# Patient Record
Sex: Male | Born: 1955 | Race: White | Hispanic: No | Marital: Married | Smoking: Current every day smoker
Health system: Southern US, Community
[De-identification: ages and names within clinical notes are randomized; demographics above are authoritative.]

## PROBLEM LIST (undated history)

## (undated) DIAGNOSIS — K219 Gastro-esophageal reflux disease without esophagitis: Secondary | ICD-10-CM

## (undated) DIAGNOSIS — I2510 Atherosclerotic heart disease of native coronary artery without angina pectoris: Secondary | ICD-10-CM

## (undated) DIAGNOSIS — E785 Hyperlipidemia, unspecified: Secondary | ICD-10-CM

## (undated) DIAGNOSIS — I10 Essential (primary) hypertension: Secondary | ICD-10-CM

## (undated) DIAGNOSIS — M1990 Unspecified osteoarthritis, unspecified site: Secondary | ICD-10-CM

## (undated) DIAGNOSIS — I6529 Occlusion and stenosis of unspecified carotid artery: Secondary | ICD-10-CM

## (undated) DIAGNOSIS — Z720 Tobacco use: Secondary | ICD-10-CM

## (undated) DIAGNOSIS — J449 Chronic obstructive pulmonary disease, unspecified: Secondary | ICD-10-CM

## (undated) DIAGNOSIS — F410 Panic disorder [episodic paroxysmal anxiety] without agoraphobia: Secondary | ICD-10-CM

## (undated) DIAGNOSIS — Z8679 Personal history of other diseases of the circulatory system: Secondary | ICD-10-CM

## (undated) DIAGNOSIS — F32A Depression, unspecified: Secondary | ICD-10-CM

## (undated) HISTORY — DX: Tobacco use: Z72.0

## (undated) HISTORY — DX: Occlusion and stenosis of unspecified carotid artery: I65.29

## (undated) HISTORY — PX: KNEE SURGERY: SHX244

## (undated) HISTORY — PX: UPPER GASTROINTESTINAL ENDOSCOPY: SHX188

## (undated) HISTORY — DX: Chronic obstructive pulmonary disease, unspecified: J44.9

## (undated) HISTORY — DX: Depression, unspecified: F32.A

## (undated) HISTORY — PX: COLONOSCOPY: SHX174

## (undated) HISTORY — DX: Personal history of other diseases of the circulatory system: Z86.79

## (undated) HISTORY — DX: Unspecified osteoarthritis, unspecified site: M19.90

---

## 1999-08-30 ENCOUNTER — Ambulatory Visit (HOSPITAL_COMMUNITY): Admission: RE | Admit: 1999-08-30 | Discharge: 1999-08-30 | Payer: Self-pay

## 2005-02-26 ENCOUNTER — Ambulatory Visit (HOSPITAL_COMMUNITY): Admission: RE | Admit: 2005-02-26 | Discharge: 2005-02-26 | Payer: Self-pay | Admitting: Specialist

## 2005-04-25 ENCOUNTER — Ambulatory Visit (HOSPITAL_COMMUNITY): Admission: RE | Admit: 2005-04-25 | Discharge: 2005-04-25 | Payer: Self-pay | Admitting: Specialist

## 2005-06-07 ENCOUNTER — Ambulatory Visit (HOSPITAL_COMMUNITY): Admission: RE | Admit: 2005-06-07 | Discharge: 2005-06-07 | Payer: Self-pay | Admitting: Specialist

## 2009-12-05 ENCOUNTER — Ambulatory Visit: Payer: Self-pay | Admitting: Radiology

## 2009-12-05 ENCOUNTER — Emergency Department (HOSPITAL_BASED_OUTPATIENT_CLINIC_OR_DEPARTMENT_OTHER): Admission: EM | Admit: 2009-12-05 | Discharge: 2009-12-05 | Payer: Self-pay | Admitting: Emergency Medicine

## 2011-04-21 ENCOUNTER — Emergency Department (HOSPITAL_BASED_OUTPATIENT_CLINIC_OR_DEPARTMENT_OTHER)
Admission: EM | Admit: 2011-04-21 | Discharge: 2011-04-21 | Disposition: A | Discharge status: Home / Self Care | Payer: Managed Care, Other (non HMO) | Attending: Emergency Medicine | Admitting: Emergency Medicine

## 2011-04-21 ENCOUNTER — Encounter: Payer: Self-pay | Admitting: Emergency Medicine

## 2011-04-21 ENCOUNTER — Emergency Department (HOSPITAL_BASED_OUTPATIENT_CLINIC_OR_DEPARTMENT_OTHER): Payer: Managed Care, Other (non HMO)

## 2011-04-21 ENCOUNTER — Emergency Department (INDEPENDENT_AMBULATORY_CARE_PROVIDER_SITE_OTHER): Payer: Managed Care, Other (non HMO)

## 2011-04-21 DIAGNOSIS — M25572 Pain in left ankle and joints of left foot: Secondary | ICD-10-CM

## 2011-04-21 DIAGNOSIS — X58XXXA Exposure to other specified factors, initial encounter: Secondary | ICD-10-CM

## 2011-04-21 DIAGNOSIS — M25579 Pain in unspecified ankle and joints of unspecified foot: Secondary | ICD-10-CM

## 2011-04-21 DIAGNOSIS — X500XXA Overexertion from strenuous movement or load, initial encounter: Secondary | ICD-10-CM | POA: Insufficient documentation

## 2011-04-21 HISTORY — DX: Hyperlipidemia, unspecified: E78.5

## 2011-04-21 HISTORY — DX: Essential (primary) hypertension: I10

## 2011-04-21 MED ORDER — HYDROCODONE-ACETAMINOPHEN 5-500 MG PO TABS: 1.0000 | ORAL_TABLET | Freq: Four times a day (QID) | ORAL | Status: AC | PRN

## 2011-04-21 NOTE — ED Notes (Signed)
Patient states that he hurt his left ankle 3 weeks ago at work and was trying to let it heal. Yesterday while working in the yard he feels like he re injured his ankle as he heard a "pop" while walking and felt a shard pain.

## 2011-04-21 NOTE — ED Provider Notes (Signed)
History     CSN: [id number] Arrival date & time: 04/21/2011  2:39 PM  Chief Complaint  Patient presents with  . Ankle Pain   HPI Comments: Pt states that he twisted it about 3 weeks ago and then yesterday he twisted in and heard a pop:c/o pain in lateral ankle  Patient is a 55 y.o. male presenting with ankle pain. The history is provided by the patient. No language interpreter was used.  Ankle Pain  The incident occurred more than 1 week ago. The incident occurred in the yard. The injury mechanism is unknown. The pain is present in the left ankle. The quality of the pain is described as aching. The pain is moderate. The pain has been constant since onset. Pertinent negatives include no numbness, no inability to bear weight, no loss of motion, no muscle weakness, no loss of sensation and no tingling. She reports no foreign bodies present. The symptoms are aggravated by bearing weight. She has tried immobilization for the symptoms. The treatment provided no relief.    No past medical history on file.  No past surgical history on file.  No family history on file.  History  Substance Use Topics  . Smoking status: Not on file  . Smokeless tobacco: Not on file  . Alcohol Use: Not on file    OB History    Grav Para Term Preterm Abortions TAB SAB Ect Mult Living                  Review of Systems  Neurological: Negative for tingling and numbness.  All other systems reviewed and are negative.    Physical Exam  BP 151/89  Pulse 72  Temp(Src) 97.8 F (36.6 C) (Oral)  Resp 18  Ht 5\' 8"  (1.727 m)  Wt 200 lb (90.719 kg)  BMI 30.41 kg/m2  SpO2 99%  Physical Exam  Nursing note reviewed. Constitutional: She is oriented to person, place, and time. She appears well-developed and well-nourished.  HENT:  Head: Normocephalic.  Cardiovascular: Normal rate and regular rhythm.   Pulmonary/Chest: Effort normal and breath sounds normal.  Musculoskeletal: Normal range of motion. She  exhibits no edema and no tenderness.       No obvious deformity or swelling noted to the left ankle  Neurological: She is alert and oriented to person, place, and time.  Skin: Skin is warm and dry.    ED Course  Procedures No results found for this or any previous visit. Dg Ankle Complete Left  04/21/2011  *RADIOLOGY REPORT*  Clinical Data: Injury 3 weeks ago with recent reinjury.  LEFT ANKLE COMPLETE - 3+ VIEW  Comparison: 12/05/2009.  Findings: No acute fractures noted.  Lucency noted on the oblique view appears to be trabecula and overlapping shadows.  The previously noted benign appearing cystic lesion of the distal fibular shaft is not as well delineated on the present exam.  If there were persistent or progressive symptoms, one could obtain MR imaging for further delineation.  IMPRESSION: No acute fracture detected.  Please see above.  Original Report Authenticated By: Fuller Canada, M.D.    MDM Pt has crutches and is refusing splinting:discussed f/u  Medical screening examination/treatment/procedure(s) were performed by non-physician practitioner and as supervising physician I was immediately available for consultation/collaboration. Osvaldo Human, M.D.     Teressa Lower, NP 04/21/11 1554  Teressa Lower, NP 04/21/11 1554  Carleene Cooper III, MD 04/22/11 2012

## 2012-12-18 ENCOUNTER — Telehealth: Payer: Self-pay | Admitting: Nurse Practitioner

## 2012-12-18 NOTE — Telephone Encounter (Signed)
appt made for 4/14 with mmm

## 2012-12-28 ENCOUNTER — Encounter: Payer: Self-pay | Admitting: Nurse Practitioner

## 2012-12-28 ENCOUNTER — Ambulatory Visit (INDEPENDENT_AMBULATORY_CARE_PROVIDER_SITE_OTHER): Payer: Managed Care, Other (non HMO) | Admitting: Nurse Practitioner

## 2012-12-28 VITALS — BP 126/78 | HR 56 | Temp 97.3°F | Ht 68.0 in | Wt 202.0 lb

## 2012-12-28 DIAGNOSIS — G8929 Other chronic pain: Secondary | ICD-10-CM

## 2012-12-28 DIAGNOSIS — I10 Essential (primary) hypertension: Secondary | ICD-10-CM

## 2012-12-28 DIAGNOSIS — Z0000 Encounter for general adult medical examination without abnormal findings: Secondary | ICD-10-CM

## 2012-12-28 DIAGNOSIS — F411 Generalized anxiety disorder: Secondary | ICD-10-CM | POA: Insufficient documentation

## 2012-12-28 DIAGNOSIS — E785 Hyperlipidemia, unspecified: Secondary | ICD-10-CM | POA: Insufficient documentation

## 2012-12-28 DIAGNOSIS — M25569 Pain in unspecified knee: Secondary | ICD-10-CM

## 2012-12-28 DIAGNOSIS — R079 Chest pain, unspecified: Secondary | ICD-10-CM

## 2012-12-28 LAB — COMPLETE METABOLIC PANEL WITH GFR
Albumin: 4.5 g/dL (ref 3.5–5.2)
CO2: 29 mEq/L (ref 19–32)
Calcium: 9.8 mg/dL (ref 8.4–10.5)
GFR, Est African American: 77 mL/min
GFR, Est Non African American: 67 mL/min
Glucose, Bld: 89 mg/dL (ref 70–99)
Sodium: 141 mEq/L (ref 135–145)
Total Bilirubin: 0.5 mg/dL (ref 0.3–1.2)
Total Protein: 7.3 g/dL (ref 6.0–8.3)

## 2012-12-28 MED ORDER — ALPRAZOLAM 0.5 MG PO TABS: 0.5000 mg | ORAL_TABLET | Freq: Two times a day (BID) | ORAL | Status: DC

## 2012-12-28 MED ORDER — FENOFIBRATE 160 MG PO TABS: 160.0000 mg | ORAL_TABLET | Freq: Every day | ORAL | Status: DC

## 2012-12-28 MED ORDER — IBUPROFEN 800 MG PO TABS: 800.0000 mg | ORAL_TABLET | ORAL | Status: DC | PRN

## 2012-12-28 MED ORDER — ATORVASTATIN CALCIUM 10 MG PO TABS: ORAL_TABLET | ORAL | Status: DC

## 2012-12-28 MED ORDER — VALSARTAN-HYDROCHLOROTHIAZIDE 160-25 MG PO TABS: 1.0000 | ORAL_TABLET | Freq: Every day | ORAL | Status: DC

## 2012-12-28 NOTE — Progress Notes (Signed)
Subjective:    Patient ID: Eric Saunders, male    DOB: Jan 17, 1956, 57 y.o.   MRN: [id number]  Hyperlipidemia This is a chronic problem. The current episode started more than 1 year ago. The problem is controlled. Recent lipid tests were reviewed and are normal. Exacerbating diseases include diabetes. Associated symptoms include myalgias (Fingers on both hands). Pertinent negatives include no chest pain or shortness of breath. Current antihyperlipidemic treatment includes statins. The current treatment provides moderate improvement of lipids. Compliance problems include adherence to diet and adherence to exercise.  Risk factors for coronary artery disease include male sex and hypertension.  Hypertension This is a chronic problem. The current episode started more than 1 year ago. The problem has been resolved since onset. The problem is controlled. Associated symptoms include peripheral edema. Pertinent negatives include no chest pain, headaches, palpitations or shortness of breath. Risk factors for coronary artery disease include dyslipidemia and male gender. Past treatments include diuretics and angiotensin blockers. The current treatment provides significant improvement. Compliance problems include exercise and diet.   GAD This is a chronic problem. Pt states he is only having to take Xanax a few times a week.  Pt has had intermittent chest and back pain that occurs after work and rest. States chest pain lasts for several minutes but goes away without medication.    Review of Systems  Respiratory: Negative for shortness of breath.   Cardiovascular: Negative for chest pain and palpitations.  Musculoskeletal: Positive for myalgias (Fingers on both hands).  Neurological: Negative for headaches.       Objective:   Physical Exam  Constitutional: He is oriented to person, place, and time. He appears well-developed and well-nourished.  HENT:  Head: Normocephalic.  Right Ear: External ear  normal.  Left Ear: External ear normal.  Nose: Nose normal.  Mouth/Throat: Oropharynx is clear and moist.  Eyes: EOM are normal. Pupils are equal, round, and reactive to light.  Neck: Normal range of motion. Neck supple. No thyromegaly present.  Cardiovascular: Normal rate, regular rhythm, normal heart sounds and intact distal pulses.   No murmur heard. Pulmonary/Chest: Effort normal and breath sounds normal. He has no wheezes. He has no rales.  Abdominal: Soft. Bowel sounds are normal.  Genitourinary: Prostate normal and penis normal.  Musculoskeletal: Normal range of motion.  Neurological: He is alert and oriented to person, place, and time.  Skin: Skin is warm and dry.  Psychiatric: He has a normal mood and affect. His behavior is normal. Judgment and thought content normal.    Pt refused prostate exam.  BP 126/78  Pulse 56  Temp(Src) 97.3 F (36.3 C) (Oral)  Ht 5\' 8"  (1.727 m)  Wt 202 lb (91.627 kg)  BMI 30.72 kg/m2      Assessment & Plan:  1. Essential hypertension, benign Low Na+ salt Try to exercise at least 30 min/day - valsartan-hydrochlorothiazide (DIOVAN-HCT) 160-25 MG per tablet; Take 1 tablet by mouth daily.  Dispense: 90 tablet; Refill: 1 - COMPLETE METABOLIC PANEL WITH GFR  2. Other and unspecified hyperlipidemia Low fat diet - fenofibrate 160 MG tablet; Take 1 tablet (160 mg total) by mouth daily.  Dispense: 90 tablet; Refill: 1 - atorvastatin (LIPITOR) 10 MG tablet; Take 1 PO QD  Dispense: 90 tablet; Refill: 1 - NMR Lipoprofile with Lipids  3. GAD (generalized anxiety disorder) Stress Management - ALPRAZolam (XANAX) 0.5 MG tablet; Take 1 tablet (0.5 mg total) by mouth 2 (two) times daily.  Dispense: 60 tablet; Refill: 0  4. Bilateral chronic knee pain Moist Heat - ibuprofen (ADVIL,MOTRIN) 800 MG tablet; Take 1 tablet (800 mg total) by mouth as needed. Pain  Dispense: 90 tablet; Refill: 1  5. Routine health maintenance - PSA  6. Intermittent chest  pain EKG showed normal sinus rhythm  - EKG 12-Lead   Mary-Margaret Daphine Deutscher, FNP

## 2012-12-28 NOTE — Patient Instructions (Signed)
Health Maintenance, Males A healthy lifestyle and preventative care can promote health and wellness.  Maintain regular health, dental, and eye exams.  Eat a healthy diet. Foods like vegetables, fruits, whole grains, low-fat dairy products, and lean protein foods contain the nutrients you need without too many calories. Decrease your intake of foods high in solid fats, added sugars, and salt. Get information about a proper diet from your caregiver, if necessary.  Regular physical exercise is one of the most important things you can do for your health. Most adults should get at least 150 minutes of moderate-intensity exercise (any activity that increases your heart rate and causes you to sweat) each week. In addition, most adults need muscle-strengthening exercises on 2 or more days a week.   Maintain a healthy weight. The body mass index (BMI) is a screening tool to identify possible weight problems. It provides an estimate of body fat based on height and weight. Your caregiver can help determine your BMI, and can help you achieve or maintain a healthy weight. For adults 20 years and older:  A BMI below 18.5 is considered underweight.  A BMI of 18.5 to 24.9 is normal.  A BMI of 25 to 29.9 is considered overweight.  A BMI of 30 and above is considered obese.  Maintain normal blood lipids and cholesterol by exercising and minimizing your intake of saturated fat. Eat a balanced diet with plenty of fruits and vegetables. Blood tests for lipids and cholesterol should begin at age 20 and be repeated every 5 years. If your lipid or cholesterol levels are high, you are over 50, or you are a high risk for heart disease, you may need your cholesterol levels checked more frequently.Ongoing high lipid and cholesterol levels should be treated with medicines, if diet and exercise are not effective.  If you smoke, find out from your caregiver how to quit. If you do not use tobacco, do not start.  If you  choose to drink alcohol, do not exceed 2 drinks per day. One drink is considered to be 12 ounces (355 mL) of beer, 5 ounces (148 mL) of wine, or 1.5 ounces (44 mL) of liquor.  Avoid use of street drugs. Do not share needles with anyone. Ask for help if you need support or instructions about stopping the use of drugs.  High blood pressure causes heart disease and increases the risk of stroke. Blood pressure should be checked at least every 1 to 2 years. Ongoing high blood pressure should be treated with medicines if weight loss and exercise are not effective.  If you are 45 to 55 years old, ask your caregiver if you should take aspirin to prevent heart disease.  Diabetes screening involves taking a blood sample to check your fasting blood sugar level. This should be done once every 3 years, after age 45, if you are within normal weight and without risk factors for diabetes. Testing should be considered at a younger age or be carried out more frequently if you are overweight and have at least 1 risk factor for diabetes.  Colorectal cancer can be detected and often prevented. Most routine colorectal cancer screening begins at the age of 50 and continues through age 75. However, your caregiver may recommend screening at an earlier age if you have risk factors for colon cancer. On a yearly basis, your caregiver may provide home test kits to check for hidden blood in the stool. Use of a small camera at the end of a tube,   to directly examine the colon (sigmoidoscopy or colonoscopy), can detect the earliest forms of colorectal cancer. Talk to your caregiver about this at age 50, when routine screening begins. Direct examination of the colon should be repeated every 5 to 10 years through age 75, unless early forms of pre-cancerous polyps or small growths are found.  Hepatitis C blood testing is recommended for all people born from 1945 through 1965 and any individual with known risks for hepatitis C.  Healthy  men should no longer receive prostate-specific antigen (PSA) blood tests as part of routine cancer screening. Consult with your caregiver about prostate cancer screening.  Testicular cancer screening is not recommended for adolescents or adult males who have no symptoms. Screening includes self-exam, caregiver exam, and other screening tests. Consult with your caregiver about any symptoms you have or any concerns you have about testicular cancer.  Practice safe sex. Use condoms and avoid high-risk sexual practices to reduce the spread of sexually transmitted infections (STIs).  Use sunscreen with a sun protection factor (SPF) of 30 or greater. Apply sunscreen liberally and repeatedly throughout the day. You should seek shade when your shadow is shorter than you. Protect yourself by wearing long sleeves, pants, a wide-brimmed hat, and sunglasses year round, whenever you are outdoors.  Notify your caregiver of new moles or changes in moles, especially if there is a change in shape or color. Also notify your caregiver if a mole is larger than the size of a pencil eraser.  A one-time screening for abdominal aortic aneurysm (AAA) and surgical repair of large AAAs by sound wave imaging (ultrasonography) is recommended for ages 65 to 75 years who are current or former smokers.  Stay current with your immunizations. Document Released: 02/29/2008 Document Revised: 11/25/2011 Document Reviewed: 01/28/2011 [employer] Patient Information 2013 [employer].  

## 2012-12-29 ENCOUNTER — Other Ambulatory Visit: Payer: Self-pay | Admitting: Nurse Practitioner

## 2012-12-29 ENCOUNTER — Telehealth: Payer: Self-pay | Admitting: Nurse Practitioner

## 2012-12-29 MED ORDER — ATORVASTATIN CALCIUM 40 MG PO TABS: 40.0000 mg | ORAL_TABLET | Freq: Every day | ORAL | Status: DC

## 2012-12-29 NOTE — Telephone Encounter (Signed)
PATIENT HAS NOT GOT IT FILLED YET BECAUSE IT IS MAIL ORDER HE WOULD REALLY LIKE TO GET THE RIGHT DOSE TO SEND IN THIS TIME PLEASE

## 2012-12-29 NOTE — Telephone Encounter (Signed)
Ok sent Rx to [facility] cancel taht please and i will reprint new RX in AM

## 2012-12-29 NOTE — Telephone Encounter (Signed)
PLEASE ADVISE.

## 2012-12-29 NOTE — Telephone Encounter (Signed)
CALLED AND CANCELLED RX WILL SIGN RX IN THE MORNING

## 2012-12-29 NOTE — Telephone Encounter (Signed)
Tell patient to take what he has $ at a time so not wasted and will send in new rx to pharmacy!

## 2012-12-30 ENCOUNTER — Telehealth: Payer: Self-pay | Admitting: Nurse Practitioner

## 2012-12-30 LAB — NMR LIPOPROFILE WITH LIPIDS
HDL Size: 8.2 nm — ABNORMAL LOW (ref >=9.2)
HDL-C: 42 mg/dL (ref >=40)
LDL Size: 20.3 nm — ABNORMAL LOW (ref >20.5)
Large HDL-P: <1.3 umol/L — ABNORMAL LOW (ref >=4.8)
Large VLDL-P: 4.4 nmol/L — ABNORMAL HIGH (ref <=2.7)
Small LDL Particle Number: 812 nmol/L — ABNORMAL HIGH (ref <=527)

## 2012-12-30 NOTE — Telephone Encounter (Signed)
rx up front 

## 2012-12-30 NOTE — Telephone Encounter (Signed)
Patient aware can not be mailed will have to come pick it up

## 2012-12-30 NOTE — Telephone Encounter (Signed)
rx faxed over to that number

## 2013-03-29 ENCOUNTER — Other Ambulatory Visit: Payer: Self-pay | Admitting: *Deleted

## 2013-03-29 DIAGNOSIS — E785 Hyperlipidemia, unspecified: Secondary | ICD-10-CM

## 2013-03-29 DIAGNOSIS — I10 Essential (primary) hypertension: Secondary | ICD-10-CM

## 2013-03-29 DIAGNOSIS — G8929 Other chronic pain: Secondary | ICD-10-CM

## 2013-03-29 MED ORDER — VALSARTAN-HYDROCHLOROTHIAZIDE 160-25 MG PO TABS: 1.0000 | ORAL_TABLET | Freq: Every day | ORAL | Status: DC

## 2013-03-29 MED ORDER — FENOFIBRATE 160 MG PO TABS: 160.0000 mg | ORAL_TABLET | Freq: Every day | ORAL | Status: DC

## 2013-03-29 MED ORDER — ATORVASTATIN CALCIUM 40 MG PO TABS: 40.0000 mg | ORAL_TABLET | Freq: Every day | ORAL | Status: DC

## 2013-03-29 MED ORDER — IBUPROFEN 800 MG PO TABS: 800.0000 mg | ORAL_TABLET | ORAL | Status: DC | PRN

## 2013-03-29 NOTE — Telephone Encounter (Signed)
Pt needs refill sent to mail order

## 2013-04-01 NOTE — Telephone Encounter (Signed)
Pt aware rx up front to pick up.  

## 2013-08-09 ENCOUNTER — Encounter: Payer: Self-pay | Admitting: Nurse Practitioner

## 2013-08-09 ENCOUNTER — Ambulatory Visit (INDEPENDENT_AMBULATORY_CARE_PROVIDER_SITE_OTHER): Payer: 59 | Admitting: Nurse Practitioner

## 2013-08-09 VITALS — BP 120/80 | HR 77 | Temp 98.7°F | Ht 68.0 in | Wt 208.0 lb

## 2013-08-09 DIAGNOSIS — I10 Essential (primary) hypertension: Secondary | ICD-10-CM

## 2013-08-09 DIAGNOSIS — E785 Hyperlipidemia, unspecified: Secondary | ICD-10-CM

## 2013-08-09 DIAGNOSIS — M25569 Pain in unspecified knee: Secondary | ICD-10-CM

## 2013-08-09 DIAGNOSIS — G8929 Other chronic pain: Secondary | ICD-10-CM

## 2013-08-09 DIAGNOSIS — F411 Generalized anxiety disorder: Secondary | ICD-10-CM

## 2013-08-09 DIAGNOSIS — Z125 Encounter for screening for malignant neoplasm of prostate: Secondary | ICD-10-CM

## 2013-08-09 DIAGNOSIS — M79609 Pain in unspecified limb: Secondary | ICD-10-CM

## 2013-08-09 MED ORDER — IBUPROFEN 800 MG PO TABS: 800.0000 mg | ORAL_TABLET | ORAL | Status: DC | PRN

## 2013-08-09 MED ORDER — ALPRAZOLAM 0.5 MG PO TABS: 0.5000 mg | ORAL_TABLET | Freq: Two times a day (BID) | ORAL | Status: DC

## 2013-08-09 MED ORDER — SIMVASTATIN 40 MG PO TABS: 40.0000 mg | ORAL_TABLET | Freq: Every day | ORAL | Status: DC

## 2013-08-09 MED ORDER — ATORVASTATIN CALCIUM 40 MG PO TABS: 40.0000 mg | ORAL_TABLET | Freq: Every day | ORAL | Status: DC

## 2013-08-09 MED ORDER — HYDROCHLOROTHIAZIDE 25 MG PO TABS: 25.0000 mg | ORAL_TABLET | Freq: Every day | ORAL | Status: DC

## 2013-08-09 MED ORDER — VALSARTAN-HYDROCHLOROTHIAZIDE 160-25 MG PO TABS: 1.0000 | ORAL_TABLET | Freq: Every day | ORAL | Status: DC

## 2013-08-09 MED ORDER — NEBIVOLOL HCL 5 MG PO TABS: 10.0000 mg | ORAL_TABLET | Freq: Every day | ORAL | Status: DC

## 2013-08-09 MED ORDER — FENOFIBRATE 160 MG PO TABS: 160.0000 mg | ORAL_TABLET | Freq: Every day | ORAL | Status: DC

## 2013-08-09 NOTE — Progress Notes (Signed)
Subjective:    Patient ID: Eric Saunders, male    DOB: [birth year], 57 y.o.   MRN: [id number]  Hyperlipidemia This is a chronic problem. The current episode started more than 1 year ago. The problem is controlled. Recent lipid tests were reviewed and are normal. Exacerbating diseases include diabetes. Associated symptoms include myalgias (Fingers on both hands). Pertinent negatives include no chest pain or shortness of breath. Current antihyperlipidemic treatment includes statins. The current treatment provides moderate improvement of lipids. Compliance problems include adherence to diet and adherence to exercise.  Risk factors for coronary artery disease include male sex and hypertension.  Hypertension This is a chronic problem. The current episode started more than 1 year ago. The problem has been resolved since onset. The problem is controlled. Associated symptoms include peripheral edema. Pertinent negatives include no chest pain, headaches, palpitations or shortness of breath. Risk factors for coronary artery disease include dyslipidemia and male gender. Past treatments include diuretics and angiotensin blockers. The current treatment provides significant improvement. Compliance problems include exercise and diet.   GAD This is a chronic problem. Pt states he is only having to take Xanax a few times a week.  * Patient stopped lipitor because it made him achy- stopped about 3 weeks- not really watching diet.  Review of Systems  Respiratory: Negative for shortness of breath.   Cardiovascular: Negative for chest pain and palpitations.  Musculoskeletal: Positive for myalgias (Fingers on both hands).  Neurological: Negative for headaches.       Objective:   Physical Exam  Constitutional: He is oriented to person, place, and time. He appears well-developed and well-nourished.  HENT:  Head: Normocephalic.  Right Ear: External ear normal.  Left Ear: External ear normal.  Nose: Nose normal.   Mouth/Throat: Oropharynx is clear and moist.  Eyes: EOM are normal. Pupils are equal, round, and reactive to light.  Neck: Normal range of motion. Neck supple. No thyromegaly present.  Cardiovascular: Normal rate, regular rhythm, normal heart sounds and intact distal pulses.   No murmur heard. Pulmonary/Chest: Effort normal and breath sounds normal. He has no wheezes. He has no rales.  Abdominal: Soft. Bowel sounds are normal.  Genitourinary: Prostate normal and penis normal.  Musculoskeletal: Normal range of motion.  Neurological: He is alert and oriented to person, place, and time.  Skin: Skin is warm and dry.  Psychiatric: He has a normal mood and affect. His behavior is normal. Judgment and thought content normal.    Pt refused prostate exam.  BP 120/80  Pulse 77  Temp(Src) 98.7 F (37.1 C) (Oral)  Ht 5\' 8"  (1.727 m)  Wt 208 lb (94.348 kg)  BMI 31.63 kg/m2      Assessment & Plan:   1. Other and unspecified hyperlipidemia   2. GAD (generalized anxiety disorder)   3. Essential hypertension, benign   4. Finger pain, unspecified laterality   5. Prostate cancer screening   6. Bilateral chronic knee pain    Orders Placed This Encounter  Procedures  . CMP14+EGFR  . NMR, lipoprofile  . PSA, total and free  . Arthritis Panel   Meds ordered this encounter  Medications  . atorvastatin (LIPITOR) 40 MG tablet    Sig: Take 1 tablet (40 mg total) by mouth daily.    Dispense:  90 tablet    Refill:  1    Order Specific Question:  Supervising Provider    Answer:  Ernestina Penna [[id number]]  . fenofibrate 160 MG tablet  Sig: Take 1 tablet (160 mg total) by mouth daily.    Dispense:  90 tablet    Refill:  1    Order Specific Question:  Supervising Provider    Answer:  Ernestina Penna [[id number]]  . hydrochlorothiazide (HYDRODIURIL) 25 MG tablet    Sig: Take 1 tablet (25 mg total) by mouth daily.    Dispense:  90 tablet    Refill:  1    Order Specific Question:  Supervising  Provider    Answer:  Ernestina Penna [[id number]]  . nebivolol (BYSTOLIC) 5 MG tablet    Sig: Take 2 tablets (10 mg total) by mouth daily.    Dispense:  90 tablet    Refill:  1    Order Specific Question:  Supervising Provider    Answer:  Ernestina Penna [[id number]]  . valsartan-hydrochlorothiazide (DIOVAN-HCT) 160-25 MG per tablet    Sig: Take 1 tablet by mouth daily.    Dispense:  90 tablet    Refill:  1    Order Specific Question:  Supervising Provider    Answer:  Ernestina Penna [[id number]]  . ALPRAZolam (XANAX) 0.5 MG tablet    Sig: Take 1 tablet (0.5 mg total) by mouth 2 (two) times daily.    Dispense:  60 tablet    Refill:  0    Order Specific Question:  Supervising Provider    Answer:  Ernestina Penna [[id number]]  . ibuprofen (ADVIL,MOTRIN) 800 MG tablet    Sig: Take 1 tablet (800 mg total) by mouth as needed. Pain    Dispense:  90 tablet    Refill:  1    Order Specific Question:  Supervising Provider    Answer:  Deborra Medina    Continue all meds Labs pending Diet and exercise encouraged Health maintenance reviewed Follow up in 3 months  Eric Daphine Deutscher, FNP

## 2013-08-09 NOTE — Patient Instructions (Signed)
Health Maintenance, Males A healthy lifestyle and preventative care can promote health and wellness.  Maintain regular health, dental, and eye exams.  Eat a healthy diet. Foods like vegetables, fruits, whole grains, low-fat dairy products, and lean protein foods contain the nutrients you need without too many calories. Decrease your intake of foods high in solid fats, added sugars, and salt. Get information about a proper diet from your caregiver, if necessary.  Regular physical exercise is one of the most important things you can do for your health. Most adults should get at least 150 minutes of moderate-intensity exercise (any activity that increases your heart rate and causes you to sweat) each week. In addition, most adults need muscle-strengthening exercises on 2 or more days a week.   Maintain a healthy weight. The body mass index (BMI) is a screening tool to identify possible weight problems. It provides an estimate of body fat based on height and weight. Your caregiver can help determine your BMI, and can help you achieve or maintain a healthy weight. For adults 20 years and older:  A BMI below 18.5 is considered underweight.  A BMI of 18.5 to 24.9 is normal.  A BMI of 25 to 29.9 is considered overweight.  A BMI of 30 and above is considered obese.  Maintain normal blood lipids and cholesterol by exercising and minimizing your intake of saturated fat. Eat a balanced diet with plenty of fruits and vegetables. Blood tests for lipids and cholesterol should begin at age 20 and be repeated every 5 years. If your lipid or cholesterol levels are high, you are over 50, or you are a high risk for heart disease, you may need your cholesterol levels checked more frequently.Ongoing high lipid and cholesterol levels should be treated with medicines, if diet and exercise are not effective.  If you smoke, find out from your caregiver how to quit. If you do not use tobacco, do not start.  Lung  cancer screening is recommended for adults aged 55 80 years who are at high risk for developing lung cancer because of a history of smoking. Yearly low-dose computed tomography (CT) is recommended for people who have at least a 30-pack-year history of smoking and are a current smoker or have quit within the past 15 years. A pack year of smoking is smoking an average of 1 pack of cigarettes a day for 1 year (for example: 1 pack a day for 30 years or 2 packs a day for 15 years). Yearly screening should continue until the smoker has stopped smoking for at least 15 years. Yearly screening should also be stopped for people who develop a health problem that would prevent them from having lung cancer treatment.  If you choose to drink alcohol, do not exceed 2 drinks per day. One drink is considered to be 12 ounces (355 mL) of beer, 5 ounces (148 mL) of wine, or 1.5 ounces (44 mL) of liquor.  Avoid use of street drugs. Do not share needles with anyone. Ask for help if you need support or instructions about stopping the use of drugs.  High blood pressure causes heart disease and increases the risk of stroke. Blood pressure should be checked at least every 1 to 2 years. Ongoing high blood pressure should be treated with medicines if weight loss and exercise are not effective.  If you are 45 to 55 years old, ask your caregiver if you should take aspirin to prevent heart disease.  Diabetes screening involves taking a blood   sample to check your fasting blood sugar level. This should be done once every 3 years, after age 45, if you are within normal weight and without risk factors for diabetes. Testing should be considered at a younger age or be carried out more frequently if you are overweight and have at least 1 risk factor for diabetes.  Colorectal cancer can be detected and often prevented. Most routine colorectal cancer screening begins at the age of 50 and continues through age 75. However, your caregiver may  recommend screening at an earlier age if you have risk factors for colon cancer. On a yearly basis, your caregiver may provide home test kits to check for hidden blood in the stool. Use of a small camera at the end of a tube, to directly examine the colon (sigmoidoscopy or colonoscopy), can detect the earliest forms of colorectal cancer. Talk to your caregiver about this at age 50, when routine screening begins. Direct examination of the colon should be repeated every 5 to 10 years through age 75, unless early forms of pre-cancerous polyps or small growths are found.  Hepatitis C blood testing is recommended for all people born from 1945 through 1965 and any individual with known risks for hepatitis C.  Healthy men should no longer receive prostate-specific antigen (PSA) blood tests as part of routine cancer screening. Consult with your caregiver about prostate cancer screening.  Testicular cancer screening is not recommended for adolescents or adult males who have no symptoms. Screening includes self-exam, caregiver exam, and other screening tests. Consult with your caregiver about any symptoms you have or any concerns you have about testicular cancer.  Practice safe sex. Use condoms and avoid high-risk sexual practices to reduce the spread of sexually transmitted infections (STIs).  Use sunscreen. Apply sunscreen liberally and repeatedly throughout the day. You should seek shade when your shadow is shorter than you. Protect yourself by wearing long sleeves, pants, a wide-brimmed hat, and sunglasses year round, whenever you are outdoors.  Notify your caregiver of new moles or changes in moles, especially if there is a change in shape or color. Also notify your caregiver if a mole is larger than the size of a pencil eraser.  A one-time screening for abdominal aortic aneurysm (AAA) and surgical repair of large AAAs by sound wave imaging (ultrasonography) is recommended for ages 65 to 75 years who are  current or former smokers.  Stay current with your immunizations. Document Released: 02/29/2008 Document Revised: 12/28/2012 Document Reviewed: 01/28/2011 ExitCare Patient Information 2014 [employer].  

## 2013-08-09 NOTE — Addendum Note (Signed)
Addended by: Tamera Punt on: 08/09/2013 04:22 PM   Modules accepted: Orders

## 2013-08-10 LAB — PSA, TOTAL AND FREE
PSA, Free: 0.35 ng/mL
PSA: 1.4 ng/mL (ref 0.0–4.0)

## 2013-08-10 LAB — ARTHRITIS PANEL
Basophils Absolute: 0 10*3/uL (ref 0.0–0.2)
Basos: 0 %
HCT: 40.9 % (ref 37.5–51.0)
Hemoglobin: 14.1 g/dL (ref 12.6–17.7)
Lymphocytes Absolute: 3.2 10*3/uL — ABNORMAL HIGH (ref 0.7–3.1)
Lymphs: 36 %
MCV: 84 fL (ref 79–97)
Monocytes: 5 %
Neutrophils Absolute: 4.9 10*3/uL (ref 1.4–7.0)
RDW: 13.7 % (ref 12.3–15.4)
Sed Rate: 12 mm/hr (ref 0–30)
WBC: 8.7 10*3/uL (ref 3.4–10.8)

## 2013-08-10 LAB — CMP14+EGFR
ALT: 28 IU/L (ref 0–44)
AST: 18 IU/L (ref 0–40)
Albumin/Globulin Ratio: 1.8 (ref 1.1–2.5)
BUN/Creatinine Ratio: 23 — ABNORMAL HIGH (ref 9–20)
GFR calc Af Amer: 86 mL/min/{1.73_m2} (ref >59)
GFR calc non Af Amer: 74 mL/min/{1.73_m2} (ref >59)
Globulin, Total: 2.6 g/dL (ref 1.5–4.5)
Potassium: 3.7 mmol/L (ref 3.5–5.2)
Sodium: 139 mmol/L (ref 134–144)
Total Bilirubin: 0.3 mg/dL (ref 0.0–1.2)

## 2013-08-10 LAB — NMR, LIPOPROFILE
HDL Particle Number: 38.5 umol/L (ref >=30.5)
LDL Size: 20.3 nm — ABNORMAL LOW (ref >20.5)
Small LDL Particle Number: 1063 nmol/L — ABNORMAL HIGH (ref <=527)

## 2013-08-11 ENCOUNTER — Telehealth: Payer: Self-pay | Admitting: *Deleted

## 2013-08-11 ENCOUNTER — Other Ambulatory Visit: Payer: Self-pay | Admitting: Nurse Practitioner

## 2013-08-11 MED ORDER — EZETIMIBE 10 MG PO TABS: 10.0000 mg | ORAL_TABLET | Freq: Every day | ORAL | Status: DC

## 2013-08-11 MED ORDER — SIMVASTATIN 40 MG PO TABS: 40.0000 mg | ORAL_TABLET | Freq: Every day | ORAL | Status: DC

## 2013-08-11 NOTE — Telephone Encounter (Signed)
Patient said that he had not been on lipitor for 2 weeks and that he told you that and he doesn't want to try the zetia and he said you had told him that you were going to call him in something generic and he will be willing to try the zocor. [facility] [place] cause he said that pharmacy didn't have.

## 2013-08-11 NOTE — Telephone Encounter (Signed)
Patient aware.

## 2013-08-11 NOTE — Telephone Encounter (Signed)
Ok- just tell him to tell pharmacy that he refuses rx of zetia- strict low fat diet and exercise

## 2013-09-24 ENCOUNTER — Other Ambulatory Visit: Payer: Self-pay | Admitting: *Deleted

## 2013-09-24 DIAGNOSIS — M25562 Pain in left knee: Principal | ICD-10-CM

## 2013-09-24 DIAGNOSIS — M25561 Pain in right knee: Principal | ICD-10-CM

## 2013-09-24 DIAGNOSIS — G8929 Other chronic pain: Secondary | ICD-10-CM

## 2013-09-24 MED ORDER — IBUPROFEN 800 MG PO TABS: 800.0000 mg | ORAL_TABLET | ORAL | Status: DC | PRN

## 2013-09-24 NOTE — Telephone Encounter (Signed)
Received request from [place] for refill of IBU 800mg . According to computer it was sent in during [phone] [place]. Please advise if ok to RF

## 2013-10-07 ENCOUNTER — Other Ambulatory Visit: Payer: Self-pay | Admitting: Nurse Practitioner

## 2013-10-18 ENCOUNTER — Encounter: Payer: Self-pay | Admitting: Nurse Practitioner

## 2013-10-18 ENCOUNTER — Ambulatory Visit (INDEPENDENT_AMBULATORY_CARE_PROVIDER_SITE_OTHER): Payer: BC Managed Care – PPO | Admitting: Nurse Practitioner

## 2013-10-18 ENCOUNTER — Encounter (INDEPENDENT_AMBULATORY_CARE_PROVIDER_SITE_OTHER): Payer: Self-pay

## 2013-10-18 VITALS — BP 168/78 | HR 88 | Temp 97.8°F | Ht 68.0 in | Wt 205.0 lb

## 2013-10-18 DIAGNOSIS — I10 Essential (primary) hypertension: Secondary | ICD-10-CM

## 2013-10-18 DIAGNOSIS — F411 Generalized anxiety disorder: Secondary | ICD-10-CM

## 2013-10-18 DIAGNOSIS — Z09 Encounter for follow-up examination after completed treatment for conditions other than malignant neoplasm: Secondary | ICD-10-CM

## 2013-10-18 MED ORDER — ALPRAZOLAM 0.5 MG PO TABS: 0.5000 mg | ORAL_TABLET | Freq: Two times a day (BID) | ORAL | Status: DC

## 2013-10-18 MED ORDER — VALSARTAN-HYDROCHLOROTHIAZIDE 320-25 MG PO TABS: 1.0000 | ORAL_TABLET | Freq: Every day | ORAL | Status: DC

## 2013-10-18 NOTE — Addendum Note (Signed)
Addended by: [name] on: 10/18/2013 02:47 PM   Modules accepted: Orders

## 2013-10-18 NOTE — Progress Notes (Signed)
   Subjective:    Patient ID: Eric Saunders, male    DOB: 1956-08-01, 58 y.o.   MRN: [id number]  HPI  Patient in today c/o elevated blood pressure for several weeks- said that he was playing on computer last week and started coughing and evidently he passed out- went to ER at [facility] and only thing they could find was blood pressure was elevated. They ran lots of test and they were all negative according to patient.    Review of Systems  Constitutional: Negative.   HENT: Negative.   Respiratory: Negative.   Cardiovascular: Negative.   Gastrointestinal: Negative.   Endocrine: Negative.   Genitourinary: Negative.   Neurological: Negative.   Psychiatric/Behavioral: Negative.   All other systems reviewed and are negative.       Objective:   Physical Exam  Constitutional: He is oriented to person, place, and time. He appears well-developed and well-nourished.  Cardiovascular: Normal rate, regular rhythm and normal heart sounds.   Pulmonary/Chest: Effort normal and breath sounds normal.  Musculoskeletal: Normal range of motion.  Neurological: He is alert and oriented to person, place, and time.  Psychiatric: He has a normal mood and affect. His behavior is normal. Judgment and thought content normal.    BP 168/78  Pulse 88  Temp(Src) 97.8 F (36.6 C) (Oral)  Ht 5\' 8"  (1.727 m)  Wt 205 lb (92.987 kg)  BMI 31.18 kg/m2       Assessment & Plan:   1. Hospital discharge follow-up   2. Hypertension    Meds ordered this encounter  Medications  . valsartan-hydrochlorothiazide (DIOVAN HCT) 320-25 MG per tablet    Sig: Take 1 tablet by mouth daily.    Dispense:  30 tablet    Refill:  3    Order Specific Question:  Supervising Provider    Answer:  Joycelyn Man   Changed diovan 160/25 to 320/25 daily Low NA + diet Follow up in 2 weeks with blood pressure diary  Mary-Margaret Hassell Done, FNP

## 2013-10-18 NOTE — Patient Instructions (Signed)

## 2013-11-08 ENCOUNTER — Encounter: Payer: Self-pay | Admitting: Nurse Practitioner

## 2013-11-08 ENCOUNTER — Ambulatory Visit (INDEPENDENT_AMBULATORY_CARE_PROVIDER_SITE_OTHER): Payer: BC Managed Care – PPO | Admitting: Nurse Practitioner

## 2013-11-08 VITALS — BP 149/91 | HR 80 | Temp 97.0°F | Ht 68.0 in | Wt 208.0 lb

## 2013-11-08 DIAGNOSIS — R079 Chest pain, unspecified: Secondary | ICD-10-CM

## 2013-11-08 DIAGNOSIS — I10 Essential (primary) hypertension: Secondary | ICD-10-CM

## 2013-11-08 NOTE — Patient Instructions (Signed)
Chest Pain (Nonspecific) °It is often hard to give a specific diagnosis for the cause of chest pain. There is always a chance that your pain could be related to something serious, such as a heart attack or a blood clot in the lungs. You need to follow up with your caregiver for further evaluation. °CAUSES  °· Heartburn. °· Pneumonia or bronchitis. °· Anxiety or stress. °· Inflammation around your heart (pericarditis) or lung (pleuritis or pleurisy). °· A blood clot in the lung. °· A collapsed lung (pneumothorax). It can develop suddenly on its own (spontaneous pneumothorax) or from injury (trauma) to the chest. °· Shingles infection (herpes zoster virus). °The chest wall is composed of bones, muscles, and cartilage. Any of these can be the source of the pain. °· The bones can be bruised by injury. °· The muscles or cartilage can be strained by coughing or overwork. °· The cartilage can be affected by inflammation and become sore (costochondritis). °DIAGNOSIS  °Lab tests or other studies, such as X-rays, electrocardiography, stress testing, or cardiac imaging, may be needed to find the cause of your pain.  °TREATMENT  °· Treatment depends on what may be causing your chest pain. Treatment may include: °· Acid blockers for heartburn. °· Anti-inflammatory medicine. °· Pain medicine for inflammatory conditions. °· Antibiotics if an infection is present. °· You may be advised to change lifestyle habits. This includes stopping smoking and avoiding alcohol, caffeine, and chocolate. °· You may be advised to keep your head raised (elevated) when sleeping. This reduces the chance of acid going backward from your stomach into your esophagus. °· Most of the time, nonspecific chest pain will improve within 2 to 3 days with rest and mild pain medicine. °HOME CARE INSTRUCTIONS  °· If antibiotics were prescribed, take your antibiotics as directed. Finish them even if you start to feel better. °· For the next few days, avoid physical  activities that bring on chest pain. Continue physical activities as directed. °· Do not smoke. °· Avoid drinking alcohol. °· Only take over-the-counter or prescription medicine for pain, discomfort, or fever as directed by your caregiver. °· Follow your caregiver's suggestions for further testing if your chest pain does not go away. °· Keep any follow-up appointments you made. If you do not go to an appointment, you could develop lasting (chronic) problems with pain. If there is any problem keeping an appointment, you must call to reschedule. °SEEK MEDICAL CARE IF:  °· You think you are having problems from the medicine you are taking. Read your medicine instructions carefully. °· Your chest pain does not go away, even after treatment. °· You develop a rash with blisters on your chest. °SEEK IMMEDIATE MEDICAL CARE IF:  °· You have increased chest pain or pain that spreads to your arm, neck, jaw, back, or abdomen. °· You develop shortness of breath, an increasing cough, or you are coughing up blood. °· You have severe back or abdominal pain, feel nauseous, or vomit. °· You develop severe weakness, fainting, or chills. °· You have a fever. °THIS IS AN EMERGENCY. Do not wait to see if the pain will go away. Get medical help at once. Call your local emergency services (911 in [place] Do not drive yourself to the hospital. °MAKE SURE YOU:  °· Understand these instructions. °· Will watch your condition. °· Will get help right away if you are not doing well or get worse. °Document Released: 06/12/2005 Document Revised: 11/25/2011 Document Reviewed: 04/07/2008 °ExitCare® Patient Information ©2014 [employer],   LLC. ° °

## 2013-11-08 NOTE — Progress Notes (Signed)
   Subjective:    Patient ID: Eric Saunders, male    DOB: April 07, 1956, 58 y.o.   MRN: [id number]  HPI Patient presents today follow up for high blood pressure. Has been monitoring blood pressure at home daily in the morning and has been below 140/90. No increase in symptoms. Denies any headaches, edema, or SOB. Patient has started smoking again. Patient is complaining of pain in the middle of his chest that occurs randomly and is unrelieved. Has tried no treatment for pain.   Review of Systems  Respiratory: Negative.   Cardiovascular: Positive for chest pain.  Genitourinary: Negative.   All other systems reviewed and are negative.       Objective:   Physical Exam  Constitutional: He is oriented to person, place, and time.  Cardiovascular: Normal rate, regular rhythm and normal heart sounds.   Pulmonary/Chest: Effort normal and breath sounds normal. He has no wheezes.  Neurological: He is alert and oriented to person, place, and time.  Skin: Skin is warm and dry.  Psychiatric: He has a normal mood and affect. His behavior is normal. Judgment and thought content normal.    BP 149/91  Pulse 80  Temp(Src) 97 F (36.1 C) (Oral)  Ht $R'5\' 8"'wd$  (1.727 m)  Wt 208 lb (94.348 kg)  BMI 31.63 kg/m2  Manual BP Recheck: 140/96 EKG- NSR- Preliminary reading by Ronnald Collum, FNP  [facility]    Assessment & Plan:   1. Essential hypertension, benign   2. Chest pain    Orders Placed This Encounter  Procedures  . NMR, lipoprofile  . CMP14+EGFR  . Ambulatory referral to Cardiology    Referral Priority:  Routine    Referral Type:  Consultation    Referral Reason:  Specialty Services Required    Requested Specialty:  Cardiology    Number of Visits Requested:  1  . EKG 12-Lead    Standing Status: Standing     Number of Occurrences: 1     Standing Expiration Date:     Order Specific Question:  Reason for Exam    Answer:  Intermittent Chest Pain    Labs pending Health maintenance  reviewed Diet and exercise encouraged Continue all meds Follow up  In 3 months   [place], FNP

## 2013-11-10 LAB — CMP14+EGFR
ALT: 27 IU/L (ref 0–44)
AST: 16 IU/L (ref 0–40)
Albumin/Globulin Ratio: 1.8 (ref 1.1–2.5)
Albumin: 4.8 g/dL (ref 3.5–5.5)
Alkaline Phosphatase: 50 IU/L (ref 39–117)
BUN/Creatinine Ratio: 19 (ref 9–20)
BUN: 22 mg/dL (ref 6–24)
CALCIUM: 9.9 mg/dL (ref 8.7–10.2)
CHLORIDE: 97 mmol/L (ref 97–108)
CO2: 24 mmol/L (ref 18–29)
Creatinine, Ser: 1.14 mg/dL (ref 0.76–1.27)
GFR calc Af Amer: 83 mL/min/{1.73_m2} (ref >59)
GFR calc non Af Amer: 72 mL/min/{1.73_m2} (ref >59)
GLUCOSE: 83 mg/dL (ref 65–99)
Globulin, Total: 2.7 g/dL (ref 1.5–4.5)
POTASSIUM: 4 mmol/L (ref 3.5–5.2)
Sodium: 142 mmol/L (ref 134–144)
TOTAL PROTEIN: 7.5 g/dL (ref 6.0–8.5)
Total Bilirubin: 0.3 mg/dL (ref 0.0–1.2)

## 2013-11-10 LAB — NMR, LIPOPROFILE
Cholesterol: 176 mg/dL (ref <200)
HDL Cholesterol by NMR: 43 mg/dL (ref >=40)
HDL PARTICLE NUMBER: 38.8 umol/L (ref >=30.5)
LDL Particle Number: 1779 nmol/L — ABNORMAL HIGH (ref <1000)
LDL Size: 20.2 nm — ABNORMAL LOW (ref >20.5)
LDLC SERPL CALC-MCNC: 99 mg/dL (ref <100)
LP-IR SCORE: 83 — AB (ref <=45)
SMALL LDL PARTICLE NUMBER: 1282 nmol/L — AB (ref <=527)
Triglycerides by NMR: 171 mg/dL — ABNORMAL HIGH (ref <150)

## 2013-11-22 ENCOUNTER — Other Ambulatory Visit: Payer: Self-pay

## 2013-11-22 DIAGNOSIS — F411 Generalized anxiety disorder: Secondary | ICD-10-CM

## 2013-11-22 NOTE — Telephone Encounter (Signed)
Spoke with wife and aware to get rx at [facility] . Understanding verbalized

## 2013-11-22 NOTE — Telephone Encounter (Signed)
Last seen 11/08/13   [facility]60 1 [facility]  Now wants at [place]  If approved route to nurse to call into [place]

## 2013-11-22 NOTE — Telephone Encounter (Signed)
[facility] - will change to [place] the next time he gets it filled

## 2013-12-24 ENCOUNTER — Other Ambulatory Visit: Payer: Self-pay | Admitting: *Deleted

## 2013-12-24 DIAGNOSIS — F411 Generalized anxiety disorder: Secondary | ICD-10-CM

## 2013-12-24 DIAGNOSIS — M25561 Pain in right knee: Secondary | ICD-10-CM

## 2013-12-24 DIAGNOSIS — G8929 Other chronic pain: Secondary | ICD-10-CM

## 2013-12-24 DIAGNOSIS — M25562 Pain in left knee: Secondary | ICD-10-CM

## 2013-12-24 MED ORDER — IBUPROFEN 800 MG PO TABS: 800.0000 mg | ORAL_TABLET | ORAL | Status: DC | PRN

## 2013-12-24 MED ORDER — ALPRAZOLAM 0.5 MG PO TABS: 0.5000 mg | ORAL_TABLET | Freq: Two times a day (BID) | ORAL | Status: DC

## 2013-12-24 NOTE — Telephone Encounter (Signed)
Called in.

## 2013-12-24 NOTE — Telephone Encounter (Signed)
Patient last seen in office on 11-08-13. Alprazolam last refilled on 11-19-13 for #90. IBU last refilled on 11-19-13 for #90. Please advise. If approved please route to Pool B so nurse can phone in to [place]. [phone]

## 2013-12-24 NOTE — Telephone Encounter (Signed)
Please call in xanax with 1 refills 

## 2013-12-30 ENCOUNTER — Institutional Professional Consult (permissible substitution): Payer: Managed Care, Other (non HMO) | Admitting: Cardiology

## 2014-01-11 ENCOUNTER — Institutional Professional Consult (permissible substitution): Payer: Managed Care, Other (non HMO) | Admitting: Cardiology

## 2014-01-12 ENCOUNTER — Other Ambulatory Visit: Payer: Self-pay | Admitting: *Deleted

## 2014-01-12 DIAGNOSIS — E785 Hyperlipidemia, unspecified: Secondary | ICD-10-CM

## 2014-01-12 MED ORDER — SIMVASTATIN 40 MG PO TABS: 40.0000 mg | ORAL_TABLET | Freq: Every day | ORAL | Status: DC

## 2014-01-12 MED ORDER — FENOFIBRATE 160 MG PO TABS: 160.0000 mg | ORAL_TABLET | Freq: Every day | ORAL | Status: DC

## 2014-01-12 MED ORDER — VALSARTAN-HYDROCHLOROTHIAZIDE 320-25 MG PO TABS: 1.0000 | ORAL_TABLET | Freq: Every day | ORAL | Status: DC

## 2014-01-17 ENCOUNTER — Ambulatory Visit (INDEPENDENT_AMBULATORY_CARE_PROVIDER_SITE_OTHER): Payer: BC Managed Care – PPO | Admitting: Nurse Practitioner

## 2014-01-17 ENCOUNTER — Encounter: Payer: Self-pay | Admitting: Nurse Practitioner

## 2014-01-17 VITALS — BP 102/67 | HR 83 | Temp 97.0°F | Wt 200.6 lb

## 2014-01-17 DIAGNOSIS — J309 Allergic rhinitis, unspecified: Secondary | ICD-10-CM

## 2014-01-17 DIAGNOSIS — E785 Hyperlipidemia, unspecified: Secondary | ICD-10-CM

## 2014-01-17 DIAGNOSIS — F411 Generalized anxiety disorder: Secondary | ICD-10-CM

## 2014-01-17 DIAGNOSIS — I10 Essential (primary) hypertension: Secondary | ICD-10-CM

## 2014-01-17 MED ORDER — EZETIMIBE 10 MG PO TABS: 10.0000 mg | ORAL_TABLET | Freq: Every day | ORAL | Status: DC

## 2014-01-17 MED ORDER — ALPRAZOLAM 0.5 MG PO TABS: 0.5000 mg | ORAL_TABLET | Freq: Two times a day (BID) | ORAL | Status: DC

## 2014-01-17 MED ORDER — FENOFIBRATE 160 MG PO TABS: 160.0000 mg | ORAL_TABLET | Freq: Every day | ORAL | Status: DC

## 2014-01-17 MED ORDER — ALPRAZOLAM 0.5 MG PO TABS
0.5000 mg | ORAL_TABLET | Freq: Two times a day (BID) | ORAL | Status: DC
Start: 2014-01-17 — End: 2014-01-17

## 2014-01-17 MED ORDER — SIMVASTATIN 40 MG PO TABS: 40.0000 mg | ORAL_TABLET | Freq: Every day | ORAL | Status: DC

## 2014-01-17 MED ORDER — VALSARTAN-HYDROCHLOROTHIAZIDE 320-25 MG PO TABS: 1.0000 | ORAL_TABLET | Freq: Every day | ORAL | Status: DC

## 2014-01-17 NOTE — Progress Notes (Signed)
Subjective:    Patient ID: Eric Saunders, male    DOB: 1956-01-15, 58 y.o.   MRN: [id number]  Cough Associated symptoms include myalgias (Fingers on both hands). Pertinent negatives include no chest pain, headaches or shortness of breath.  Hyperlipidemia This is a chronic problem. The current episode started more than 1 year ago. The problem is controlled. Recent lipid tests were reviewed and are normal. Exacerbating diseases include diabetes. Associated symptoms include myalgias (Fingers on both hands). Pertinent negatives include no chest pain or shortness of breath. Current antihyperlipidemic treatment includes statins. The current treatment provides moderate improvement of lipids. Compliance problems include adherence to diet and adherence to exercise.  Risk factors for coronary artery disease include male sex and hypertension.  Hypertension This is a chronic problem. The current episode started more than 1 year ago. The problem has been resolved since onset. The problem is controlled. Associated symptoms include peripheral edema. Pertinent negatives include no chest pain, headaches, palpitations or shortness of breath. Risk factors for coronary artery disease include dyslipidemia and male gender. Past treatments include diuretics and angiotensin blockers. The current treatment provides significant improvement. Compliance problems include exercise and diet.   GAD This is a chronic problem. Pt states he is only having to take Xanax a few times a week.  * C/o Cough- started several days ago  Review of Systems  Respiratory: Positive for cough. Negative for shortness of breath.   Cardiovascular: Negative for chest pain and palpitations.  Musculoskeletal: Positive for myalgias (Fingers on both hands).  Neurological: Negative for headaches.       Objective:   Physical Exam  Constitutional: He is oriented to person, place, and time. He appears well-developed and well-nourished.  HENT:  Head:  Normocephalic.  Right Ear: External ear normal.  Left Ear: External ear normal.  Nose: Nose normal.  Mouth/Throat: Oropharynx is clear and moist.  Eyes: EOM are normal. Pupils are equal, round, and reactive to light.  Neck: Normal range of motion. Neck supple. No thyromegaly present.  Cardiovascular: Normal rate, regular rhythm, normal heart sounds and intact distal pulses.   No murmur heard. Pulmonary/Chest: Effort normal and breath sounds normal. He has no wheezes. He has no rales.  Abdominal: Soft. Bowel sounds are normal.  Genitourinary: Prostate normal and penis normal.  Musculoskeletal: Normal range of motion.  Neurological: He is alert and oriented to person, place, and time.  Skin: Skin is warm and dry.  Psychiatric: He has a normal mood and affect. His behavior is normal. Judgment and thought content normal.    Pt refused prostate exam.  BP 102/67  Pulse 83  Temp(Src) 97 F (36.1 C) (Oral)  Wt 200 lb 9.6 oz (90.992 kg)      Assessment & Plan:   1. Other and unspecified hyperlipidemia   2. GAD (generalized anxiety disorder)   3. Essential hypertension, benign   4. Allergic rhinitis    Orders Placed This Encounter  Procedures  . CMP14+EGFR  . NMR, lipoprofile   Meds ordered this encounter  Medications  . ALPRAZolam (XANAX) 0.5 MG tablet    Sig: Take 1 tablet (0.5 mg total) by mouth 2 (two) times daily.    Dispense:  60 tablet    Refill:  1    Order Specific Question:  Supervising Provider    Answer:  Chipper Herb [[id number]]  . fenofibrate 160 MG tablet    Sig: Take 1 tablet (160 mg total) by mouth daily.    Dispense:  90 tablet    Refill:  0    Order Specific Question:  Supervising Provider    Answer:  Chipper Herb [[id number]]  . ezetimibe (ZETIA) 10 MG tablet    Sig: Take 1 tablet (10 mg total) by mouth daily.    Dispense:  90 tablet    Refill:  3    Order Specific Question:  Supervising Provider    Answer:  Chipper Herb [[id number]]  . simvastatin  (ZOCOR) 40 MG tablet    Sig: Take 1 tablet (40 mg total) by mouth at bedtime.    Dispense:  30 tablet    Refill:  3    Order Specific Question:  Supervising Provider    Answer:  Chipper Herb [[id number]]  . valsartan-hydrochlorothiazide (DIOVAN HCT) 320-25 MG per tablet    Sig: Take 1 tablet by mouth daily.    Dispense:  30 tablet    Refill:  3    Order Specific Question:  Supervising Provider    Answer:  Chipper Herb [[id number]]   Refuses meds for allergic rhinitis- encouraged to take zyrtec. Labs pending Health maintenance reviewed Diet and exercise encouraged Continue all meds Follow up  In 3 month   [place], FNP

## 2014-01-17 NOTE — Patient Instructions (Signed)
Allergic Rhinitis Allergic rhinitis is when the mucous membranes in the nose respond to allergens. Allergens are particles in the air that cause your body to have an allergic reaction. This causes you to release allergic antibodies. Through a chain of events, these eventually cause you to release histamine into the blood stream. Although meant to protect the body, it is this release of histamine that causes your discomfort, such as frequent sneezing, congestion, and an itchy, runny nose.  CAUSES  Seasonal allergic rhinitis (hay fever) is caused by pollen allergens that may come from grasses, trees, and weeds. Year-round allergic rhinitis (perennial allergic rhinitis) is caused by allergens such as house dust mites, pet dander, and mold spores.  SYMPTOMS   Nasal stuffiness (congestion).  Itchy, runny nose with sneezing and tearing of the eyes. DIAGNOSIS  Your health care provider can help you determine the allergen or allergens that trigger your symptoms. If you and your health care provider are unable to determine the allergen, skin or blood testing may be used. TREATMENT  Allergic Rhinitis does not have a cure, but it can be controlled by:  Medicines and allergy shots (immunotherapy).  Avoiding the allergen. Hay fever may often be treated with antihistamines in pill or nasal spray forms. Antihistamines block the effects of histamine. There are over-the-counter medicines that may help with nasal congestion and swelling around the eyes. Check with your health care provider before taking or giving this medicine.  If avoiding the allergen or the medicine prescribed do not work, there are many new medicines your health care provider can prescribe. Stronger medicine may be used if initial measures are ineffective. Desensitizing injections can be used if medicine and avoidance does not work. Desensitization is when a patient is given ongoing shots until the body becomes less sensitive to the allergen.  Make sure you follow up with your health care provider if problems continue. HOME CARE INSTRUCTIONS It is not possible to completely avoid allergens, but you can reduce your symptoms by taking steps to limit your exposure to them. It helps to know exactly what you are allergic to so that you can avoid your specific triggers. SEEK MEDICAL CARE IF:   You have a fever.  You develop a cough that does not stop easily (persistent).  You have shortness of breath.  You start wheezing.  Symptoms interfere with normal daily activities. Document Released: 05/28/2001 Document Revised: 06/23/2013 Document Reviewed: 05/10/2013 ExitCare Patient Information 2014 [employer].  

## 2014-01-18 ENCOUNTER — Telehealth: Payer: Self-pay | Admitting: Nurse Practitioner

## 2014-01-19 LAB — NMR, LIPOPROFILE
Cholesterol: 168 mg/dL (ref <200)
HDL Cholesterol by NMR: 43 mg/dL (ref >=40)
HDL Particle Number: 35.7 umol/L (ref >=30.5)
LDL PARTICLE NUMBER: 1226 nmol/L — AB (ref <1000)
LDL SIZE: 21.2 nm (ref >20.5)
LDLC SERPL CALC-MCNC: 98 mg/dL (ref <100)
LP-IR Score: 73 — ABNORMAL HIGH (ref <=45)
Small LDL Particle Number: 538 nmol/L — ABNORMAL HIGH (ref <=527)
TRIGLYCERIDES BY NMR: 135 mg/dL (ref <150)

## 2014-01-19 LAB — CMP14+EGFR
ALT: 27 IU/L (ref 0–44)
AST: 18 IU/L (ref 0–40)
Albumin/Globulin Ratio: 1.8 (ref 1.1–2.5)
Albumin: 4.6 g/dL (ref 3.5–5.5)
Alkaline Phosphatase: 54 IU/L (ref 39–117)
BUN/Creatinine Ratio: 19 (ref 9–20)
BUN: 24 mg/dL (ref 6–24)
CALCIUM: 9.6 mg/dL (ref 8.7–10.2)
CO2: 21 mmol/L (ref 18–29)
Chloride: 101 mmol/L (ref 97–108)
Creatinine, Ser: 1.28 mg/dL — ABNORMAL HIGH (ref 0.76–1.27)
GFR calc Af Amer: 72 mL/min/{1.73_m2} (ref >59)
GFR calc non Af Amer: 63 mL/min/{1.73_m2} (ref >59)
GLUCOSE: 89 mg/dL (ref 65–99)
Globulin, Total: 2.5 g/dL (ref 1.5–4.5)
POTASSIUM: 3.7 mmol/L (ref 3.5–5.2)
SODIUM: 142 mmol/L (ref 134–144)
TOTAL PROTEIN: 7.1 g/dL (ref 6.0–8.5)
Total Bilirubin: 0.3 mg/dL (ref 0.0–1.2)

## 2014-01-19 NOTE — Telephone Encounter (Signed)
According to wife patient was mad when he returned from appointment yesterday and wouldn't tell her anything. She wanted to know what was discussed because she knows he is noncompliant. He is scheduled to see cardiology tomorrow but says he isn't going.  Explained that I was sorry but she wasn't listed on his HIPAA form so I am not allowed to discuss anything with her. She understood and will talk with him again.

## 2014-01-25 ENCOUNTER — Encounter: Payer: Self-pay | Admitting: Cardiology

## 2014-01-25 ENCOUNTER — Ambulatory Visit (INDEPENDENT_AMBULATORY_CARE_PROVIDER_SITE_OTHER): Payer: BC Managed Care – PPO | Admitting: Cardiology

## 2014-01-25 VITALS — BP 164/90 | HR 72 | Ht 68.0 in | Wt 200.1 lb

## 2014-01-25 DIAGNOSIS — R079 Chest pain, unspecified: Secondary | ICD-10-CM

## 2014-01-25 DIAGNOSIS — R9431 Abnormal electrocardiogram [ECG] [EKG]: Secondary | ICD-10-CM

## 2014-01-25 NOTE — Patient Instructions (Signed)
Your physician recommends that you continue on your current medications as directed. Please refer to the Current Medication list given to you today.  Your physician has requested that you have an exercise tolerance test. For further information please visit [email or web address]. Please also follow instruction sheet, as given.   Your physician recommends that you schedule a follow-up appointment as needed  

## 2014-01-25 NOTE — Progress Notes (Signed)
HPI The patient presents for evaluation of chest pain and syncope. He has no prior cardiac history. He does have multiple cardiovascular risk factors. He says that at the end of January he had some coughing which has been an occasional problem. He coughs aggressively when it happens. On ligation when this was happening he had a syncopal episode for about 10 seconds.  He did have evaluation at [facility].  However, there was apparently no abnormality found. He presented to [facility].  He had a normal EKG.  However, he was describing some chest discomfort. This seems to happen more when he was moving items at work. It is a sharp and fleeting discomfort in his left upper chest. He doesn't describe radiation to his neck or to his arms. There were no associated symptoms. He doesn't particularly get short of breath with this. He's not having PND or orthopnea. He otherwise is able to do a vigorous job and do other activities without bringing on any symptoms.  No Known Allergies  Current Outpatient Prescriptions  Medication Sig Dispense Refill  . ALPRAZolam (XANAX) 0.5 MG tablet Take 1 tablet (0.5 mg total) by mouth 2 (two) times daily.  60 tablet  1  . fenofibrate 160 MG tablet Take 1 tablet (160 mg total) by mouth daily.  90 tablet  0  . ibuprofen (ADVIL,MOTRIN) 800 MG tablet Take 1 tablet (800 mg total) by mouth as needed. Pain  90 tablet  1  . simvastatin (ZOCOR) 40 MG tablet Take 1 tablet (40 mg total) by mouth at bedtime.  30 tablet  3  . valsartan-hydrochlorothiazide (DIOVAN HCT) 320-25 MG per tablet Take 1 tablet by mouth daily.  30 tablet  3   No current facility-administered medications for this visit.    Past Medical History  Diagnosis Date  . Hypertension   . Hyperlipemia     Past Surgical History  Procedure Laterality Date  . Knee surgery      Family History  Problem Relation Age of Onset  . Healthy Mother   . Healthy Father   . Drug abuse Sister   . Healthy Brother   . Healthy  Sister   . Healthy Sister   . Healthy Sister   . Healthy Sister   . Healthy Sister   . Healthy Brother     History   Social History  . Marital Status: Married    Spouse Name: N/A    Number of Children: N/A  . Years of Education: N/A   Occupational History  . Not on file.   Social History Main Topics  . Smoking status: Current Every Day Smoker -- 1.00 packs/day for 30 years    Types: Cigarettes    Last Attempt to Quit: 12/14/2012  . Smokeless tobacco: Not on file  . Alcohol Use: Yes     Comment: occasionally  . Drug Use: No  . Sexual Activity: Yes   Other Topics Concern  . Not on file   Social History Narrative  . No narrative on file    ROS:  Positive for reflux, urinary frequency and nocturia.  Otherwise stated in the HPI and negative for all other systems.  PHYSICAL EXAM BP 164/90  Pulse 72  Ht 5\' 8"  (1.727 m)  Wt 200 lb 1.9 oz (90.774 kg)  BMI 30.44 kg/m2 GENERAL:  Well appearing HEENT:  Pupils equal round and reactive, fundi not visualized, oral mucosa unremarkable NECK:  No jugular venous distention, waveform within normal limits, carotid upstroke brisk  and symmetric, no bruits, no thyromegaly LYMPHATICS:  No cervical, inguinal adenopathy LUNGS:  Clear to auscultation bilaterally BACK:  No CVA tenderness CHEST:  Unremarkable HEART:  PMI not displaced or sustained,S1 and S2 within normal limits, no S3, no S4, no clicks, no rubs, no  murmurs ABD:  Flat, positive bowel sounds normal in frequency in pitch, no bruits, no rebound, no guarding, no midline pulsatile mass, no hepatomegaly, no splenomegaly EXT:  2 plus pulses throughout, no edema, no cyanosis no clubbing SKIN:  No rashes no nodules NEURO:  Cranial nerves II through XII grossly intact, motor grossly intact throughout PSYCH:  Cognitively intact, oriented to person place and time   EKG: Sinus rhythm, rate 72, axis within normal limits, intervals within normal limits, no acute ST-T wave  changes.  ASSESSMENT AND PLAN  CHEST PAIN:  This is atypical.  However, he has multiple risk factors.  I will bring the patient back for a POET (Plain Old Exercise Test). This will allow me to screen for obstructive coronary disease, risk stratify and very importantly provide a prescription for exercise.  TOBACCO:  We discussed a specific strategy for tobacco cessation.   SYNCOPE:  This cough syncope and as such is a coughing problem more than a syncope problem.  He needs to quit smoking.  Also, he can consider an OTC antacid therapy.

## 2014-02-17 ENCOUNTER — Telehealth (HOSPITAL_COMMUNITY): Payer: Self-pay

## 2014-02-24 ENCOUNTER — Ambulatory Visit (HOSPITAL_COMMUNITY)
Admission: RE | Admit: 2014-02-24 | Discharge: 2014-02-24 | Disposition: A | Discharge status: Home / Self Care | Payer: BC Managed Care – PPO | Source: Ambulatory Visit | Attending: Cardiology | Admitting: Cardiology

## 2014-02-24 DIAGNOSIS — R079 Chest pain, unspecified: Secondary | ICD-10-CM

## 2014-02-24 DIAGNOSIS — R9431 Abnormal electrocardiogram [ECG] [EKG]: Secondary | ICD-10-CM

## 2014-03-10 ENCOUNTER — Telehealth: Payer: Self-pay | Admitting: *Deleted

## 2014-03-10 NOTE — Telephone Encounter (Signed)
Notes Recorded by Minus Breeding, MD on 03/04/2014 at 5:07 PM Positive stress test. Needs cardiac cath. Discussed with patient. The patient understands that risks included but are not limited to stroke (1 in 1000), death (1 in 67), kidney failure [usually temporary] (1 in 500), bleeding (1 in 200), allergic reaction [possibly serious] (1 in 200). The patient understands and agrees to proceed. He wants [place] to call him back late Monday or Tuesday to schedule. He is not having unstable or increased symptoms. He can come to the ED if he has increased symptoms.  Spoke with wife about scheduling of cath since pt is at work.  She states pt wants to wait to have cath done after he has been at his job for 6 months.  Advised pt needs to go ahead and have procedure due to his recent positive stress testing.  Wife gave me pts cell number [phone].  I called and left message for him to call back to discuss and schedule.

## 2014-03-17 NOTE — Telephone Encounter (Signed)
Encounter complete. 

## 2014-03-21 NOTE — Telephone Encounter (Signed)
New message    Wife calling check on the status of heart cath.

## 2014-03-28 NOTE — Telephone Encounter (Signed)
We will call in the pt. In the morning and schedule a cath

## 2014-03-28 NOTE — Telephone Encounter (Signed)
Follow Up  Pt wife called back to schedule Heart Cath// Please call

## 2014-03-29 ENCOUNTER — Telehealth: Payer: Self-pay | Admitting: *Deleted

## 2014-03-29 ENCOUNTER — Telehealth: Payer: Self-pay | Admitting: Cardiology

## 2014-03-29 NOTE — Telephone Encounter (Signed)
Called pt. To schedule for lt. Heart cath pt. Stated he wanted to wait until September, Dr. Percival Spanish informed and kay informed of Dr. Percival Spanish being informed, Dr. Percival Spanish stated there was nothing we could do about that so we will have to wait until September to do the cath

## 2014-03-29 NOTE — Telephone Encounter (Signed)
I called and spoke with wife and she states that Mr. Feenstra wants to wait until September to schedule the heart cath.  I spoke with Dr. Rosezella [place] nurse Diona Browner. Tressie Stalker and relayed this message to him.  He states that he spoke with Dr. Percival Spanish and he state we could schedule this, if pt refused to schedule prior to September.  I called Mrs. Back and scheduled Mr. Holte an appointment with Dr. Percival Spanish in September to discuss cath.   Mrs. Jobe Igo understanding.

## 2014-04-19 NOTE — Telephone Encounter (Signed)
Pt. Called and I discussed the plan to get a Lt. Heart cath in Sept.; Pt. Stated as far as he knew he was planning on going through with it sometime in the middle of the month and would call us and let either me or Dr. Percival Spanish know  When he was ready.

## 2014-05-12 ENCOUNTER — Other Ambulatory Visit: Payer: Self-pay | Admitting: Nurse Practitioner

## 2014-05-12 ENCOUNTER — Encounter: Payer: Self-pay | Admitting: *Deleted

## 2014-05-13 NOTE — Telephone Encounter (Signed)
Last seen 01/17/14  MMM

## 2014-05-16 NOTE — Telephone Encounter (Signed)
Please call in ativan with 1 refills 

## 2014-05-16 NOTE — Telephone Encounter (Signed)
Called in.

## 2014-05-17 ENCOUNTER — Ambulatory Visit (INDEPENDENT_AMBULATORY_CARE_PROVIDER_SITE_OTHER): Payer: BC Managed Care – PPO | Admitting: Cardiology

## 2014-05-17 ENCOUNTER — Inpatient Hospital Stay: Admission: AD | Admit: 2014-05-17 | Payer: BC Managed Care – PPO | Source: Ambulatory Visit | Admitting: Cardiology

## 2014-05-17 ENCOUNTER — Encounter: Payer: Self-pay | Admitting: Cardiology

## 2014-05-17 VITALS — BP 120/60 | HR 89 | Ht 68.0 in | Wt 194.0 lb

## 2014-05-17 DIAGNOSIS — R9439 Abnormal result of other cardiovascular function study: Secondary | ICD-10-CM

## 2014-05-17 DIAGNOSIS — Z79899 Other long term (current) drug therapy: Secondary | ICD-10-CM

## 2014-05-17 DIAGNOSIS — R5383 Other fatigue: Secondary | ICD-10-CM

## 2014-05-17 DIAGNOSIS — R5381 Other malaise: Secondary | ICD-10-CM

## 2014-05-17 DIAGNOSIS — Z01810 Encounter for preprocedural cardiovascular examination: Secondary | ICD-10-CM

## 2014-05-17 DIAGNOSIS — D689 Coagulation defect, unspecified: Secondary | ICD-10-CM

## 2014-05-17 NOTE — Progress Notes (Signed)
 HPI The patient returns for evaluation of chest pain  He reported some discomfort at work when he is moving items. It is a sharp and fleeting discomfort in his left upper chest. He doesn't describe radiation to his neck or to his arms. There were no associated symptoms. He doesn't particularly get short of breath with this. He's not having PND or orthopnea. He reports that this has been a relatively stable pattern since we first met.  However, I did send him for a POET (Plain Old Exercise Treadmill).  This demonstrated ST segment depression at peak exercise. This suggested obstructive coronary disease. He did have a hypertensive blood pressure response. I suggested cardiac catheterization.  However, he deferred and wanted to come back and talk about this.  No Known Allergies  Current Outpatient Prescriptions  Medication Sig Dispense Refill  . ALPRAZolam (XANAX) 0.5 MG tablet TAKE ONE TABLET BY MOUTH TWICE DAILY  60 tablet  0  . fenofibrate 160 MG tablet Take 1 tablet (160 mg total) by mouth daily.  90 tablet  0  . ibuprofen (ADVIL,MOTRIN) 800 MG tablet TAKE ONE TABLET BY MOUTH AS NEEDED FOR PAIN  30 tablet  0  . simvastatin (ZOCOR) 40 MG tablet Take 40 mg by mouth daily.      . valsartan-hydrochlorothiazide (DIOVAN HCT) 320-25 MG per tablet Take 1 tablet by mouth daily.  30 tablet  3   No current facility-administered medications for this visit.    Past Medical History  Diagnosis Date  . Hypertension   . Hyperlipemia     Past Surgical History  Procedure Laterality Date  . Knee surgery      bilateral arthroscopic    ROS:  Positive for reflux.  Otherwise as stated in the HPI and negative for all other systems.  PHYSICAL EXAM BP 122/80  Pulse 89  Ht 5' 8" (1.727 m)  Wt 194 lb (87.998 kg)  BMI 29.50 kg/m2 GENERAL:  Well appearing HEENT:  Pupils equal round and reactive, fundi not visualized, oral mucosa unremarkable NECK:  No jugular venous distention, waveform within normal  limits, carotid upstroke brisk and symmetric, no bruits, no thyromegaly LYMPHATICS:  No cervical, inguinal adenopathy LUNGS:  Clear to auscultation bilaterally BACK:  No CVA tenderness CHEST:  Unremarkable HEART:  PMI not displaced or sustained,S1 and S2 within normal limits, no S3, no S4, no clicks, no rubs, no  murmurs ABD:  Flat, positive bowel sounds normal in frequency in pitch, no bruits, no rebound, no guarding, no midline pulsatile mass, no hepatomegaly, no splenomegaly EXT:  2 plus pulses throughout, no edema, no cyanosis no clubbing SKIN:  No rashes no nodules NEURO:  Cranial nerves II through XII grossly intact, motor grossly intact throughout PSYCH:  Cognitively intact, oriented to person place and time   ASSESSMENT AND PLAN  CHEST PAIN:   Given the ongoing pain and the abnormal stress test cath is indicated.  We discussed at length the risk benefits.  The patient understands that risks included but are not limited to stroke (1 in 1000), death (1 in 1000), kidney failure [usually temporary] (1 in 500), bleeding (1 in 200), allergic reaction [possibly serious] (1 in 200).  The patient understands and agrees to proceed.   He wants to proceed with cath.    TOBACCO:  We discussed a specific strategy for tobacco cessation.   SYNCOPE:  This cough syncope and as such is a coughing problem more than a syncope problem.  He has had no   further episodes.    HTN:  BP at rest is OK.  He is going to keep a BP diary since he had a hypertensive response with exercise.

## 2014-05-17 NOTE — Patient Instructions (Signed)
Your physician recommends that you schedule a follow-up appointment in:  After your heart cath  Have your lab work done 7 days prior to your procedure  We will do a chest x-ray on admission to the hospital  We are doing a left heart cath

## 2014-05-31 ENCOUNTER — Other Ambulatory Visit: Payer: Self-pay | Admitting: *Deleted

## 2014-05-31 ENCOUNTER — Encounter: Payer: Self-pay | Admitting: Unknown Physician Specialty

## 2014-05-31 ENCOUNTER — Encounter: Payer: Self-pay | Admitting: Cardiovascular Disease

## 2014-05-31 DIAGNOSIS — D689 Coagulation defect, unspecified: Secondary | ICD-10-CM

## 2014-05-31 DIAGNOSIS — Z79899 Other long term (current) drug therapy: Secondary | ICD-10-CM

## 2014-05-31 DIAGNOSIS — R5381 Other malaise: Secondary | ICD-10-CM

## 2014-05-31 DIAGNOSIS — R5383 Other fatigue: Secondary | ICD-10-CM

## 2014-05-31 DIAGNOSIS — Z01810 Encounter for preprocedural cardiovascular examination: Secondary | ICD-10-CM

## 2014-06-10 ENCOUNTER — Ambulatory Visit
Admission: RE | Admit: 2014-06-10 | Discharge: 2014-06-10 | Disposition: A | Discharge status: Home / Self Care | Payer: BC Managed Care – PPO | Source: Ambulatory Visit | Attending: Cardiology | Admitting: Cardiology

## 2014-06-10 DIAGNOSIS — Z01810 Encounter for preprocedural cardiovascular examination: Secondary | ICD-10-CM

## 2014-06-11 LAB — CBC
HCT: 42.2 % (ref 39.0–52.0)
Hemoglobin: 14.7 g/dL (ref 13.0–17.0)
MCH: 29.3 pg (ref 26.0–34.0)
MCHC: 34.8 g/dL (ref 30.0–36.0)
MCV: 84.1 fL (ref 78.0–100.0)
PLATELETS: 370 10*3/uL (ref 150–400)
RBC: 5.02 MIL/uL (ref 4.22–5.81)
RDW: 14.2 % (ref 11.5–15.5)
WBC: 8.8 10*3/uL (ref 4.0–10.5)

## 2014-06-11 LAB — COMPLETE METABOLIC PANEL WITH GFR
ALT: 25 U/L (ref 0–53)
AST: 14 U/L (ref 0–37)
Albumin: 4.5 g/dL (ref 3.5–5.2)
Alkaline Phosphatase: 46 U/L (ref 39–117)
BILIRUBIN TOTAL: 0.3 mg/dL (ref 0.2–1.2)
BUN: 19 mg/dL (ref 6–23)
CO2: 29 mEq/L (ref 19–32)
Calcium: 9.9 mg/dL (ref 8.4–10.5)
Chloride: 101 mEq/L (ref 96–112)
Creat: 1.33 mg/dL (ref 0.50–1.35)
GFR, Est African American: 69 mL/min
GFR, Est Non African American: 59 mL/min — ABNORMAL LOW
Glucose, Bld: 97 mg/dL (ref 70–99)
Potassium: 4 mEq/L (ref 3.5–5.3)
Sodium: 141 mEq/L (ref 135–145)
Total Protein: 7 g/dL (ref 6.0–8.3)

## 2014-06-11 LAB — URINALYSIS, MICROSCOPIC ONLY
BACTERIA UA: NONE SEEN
CASTS: NONE SEEN
CRYSTALS: NONE SEEN
Squamous Epithelial / LPF: NONE SEEN
WBC, UA: NONE SEEN WBC/hpf (ref <3)

## 2014-06-11 LAB — PROTIME-INR
INR: 1.01 (ref <1.50)
Prothrombin Time: 13.3 seconds (ref 11.6–15.2)

## 2014-06-11 LAB — APTT: aPTT: 33 seconds (ref 24–37)

## 2014-06-13 ENCOUNTER — Other Ambulatory Visit: Payer: Self-pay | Admitting: *Deleted

## 2014-06-13 ENCOUNTER — Encounter (HOSPITAL_COMMUNITY): Payer: Self-pay

## 2014-06-13 DIAGNOSIS — Z01810 Encounter for preprocedural cardiovascular examination: Secondary | ICD-10-CM

## 2014-06-16 ENCOUNTER — Observation Stay (HOSPITAL_COMMUNITY): Payer: BC Managed Care – PPO

## 2014-06-16 ENCOUNTER — Inpatient Hospital Stay (HOSPITAL_COMMUNITY)
Admission: RE | Admit: 2014-06-16 | Discharge: 2014-06-23 | DRG: 234 | Disposition: A | Discharge status: Home / Self Care | Payer: BC Managed Care – PPO | Source: Ambulatory Visit | Attending: Surgery | Admitting: Surgery

## 2014-06-16 ENCOUNTER — Encounter (HOSPITAL_COMMUNITY): Payer: Self-pay | Admitting: General Practice

## 2014-06-16 ENCOUNTER — Encounter (HOSPITAL_COMMUNITY)
Admission: RE | Disposition: A | Discharge status: Home / Self Care | Payer: Self-pay | Source: Ambulatory Visit | Attending: Surgery

## 2014-06-16 ENCOUNTER — Other Ambulatory Visit: Payer: Self-pay

## 2014-06-16 DIAGNOSIS — Z79899 Other long term (current) drug therapy: Secondary | ICD-10-CM

## 2014-06-16 DIAGNOSIS — E119 Type 2 diabetes mellitus without complications: Secondary | ICD-10-CM | POA: Diagnosis present

## 2014-06-16 DIAGNOSIS — F17210 Nicotine dependence, cigarettes, uncomplicated: Secondary | ICD-10-CM | POA: Diagnosis present

## 2014-06-16 DIAGNOSIS — E785 Hyperlipidemia, unspecified: Secondary | ICD-10-CM | POA: Diagnosis present

## 2014-06-16 DIAGNOSIS — I10 Essential (primary) hypertension: Secondary | ICD-10-CM | POA: Diagnosis present

## 2014-06-16 DIAGNOSIS — I25110 Atherosclerotic heart disease of native coronary artery with unstable angina pectoris: Principal | ICD-10-CM | POA: Diagnosis present

## 2014-06-16 DIAGNOSIS — I2510 Atherosclerotic heart disease of native coronary artery without angina pectoris: Secondary | ICD-10-CM

## 2014-06-16 DIAGNOSIS — I200 Unstable angina: Secondary | ICD-10-CM | POA: Diagnosis present

## 2014-06-16 DIAGNOSIS — Z01810 Encounter for preprocedural cardiovascular examination: Secondary | ICD-10-CM

## 2014-06-16 DIAGNOSIS — Z951 Presence of aortocoronary bypass graft: Secondary | ICD-10-CM

## 2014-06-16 HISTORY — DX: Atherosclerotic heart disease of native coronary artery without angina pectoris: I25.10

## 2014-06-16 HISTORY — PX: LEFT HEART CATHETERIZATION WITH CORONARY ANGIOGRAM: SHX5451

## 2014-06-16 LAB — PULMONARY FUNCTION TEST
DL/VA % pred: 115 %
DL/VA: 5.2 ml/min/mmHg/L
DLCO COR: 25.78 ml/min/mmHg
DLCO UNC: 25.85 ml/min/mmHg
DLCO cor % pred: 86 %
DLCO unc % pred: 87 %
FEF 25-75 PRE: 1.27 L/s
FEF 25-75 Post: 0.66 L/sec
FEF2575-%Change-Post: -48 %
FEF2575-%Pred-Post: 21 %
FEF2575-%Pred-Pre: 42 %
FEV1-%CHANGE-POST: -18 %
FEV1-%Pred-Post: 47 %
FEV1-%Pred-Pre: 58 %
FEV1-Post: 1.66 L
FEV1-Pre: 2.04 L
FEV1FVC-%Change-Post: -9 %
FEV1FVC-%Pred-Pre: 88 %
FEV6-%CHANGE-POST: -10 %
FEV6-%PRED-POST: 61 %
FEV6-%PRED-PRE: 68 %
FEV6-POST: 2.67 L
FEV6-PRE: 2.98 L
FEV6FVC-%Change-Post: 0 %
FEV6FVC-%PRED-POST: 102 %
FEV6FVC-%PRED-PRE: 102 %
FVC-%Change-Post: -9 %
FVC-%PRED-POST: 60 %
FVC-%PRED-PRE: 66 %
FVC-PRE: 3.04 L
FVC-Post: 2.75 L
POST FEV1/FVC RATIO: 60 %
POST FEV6/FVC RATIO: 98 %
Pre FEV1/FVC ratio: 67 %
Pre FEV6/FVC Ratio: 98 %
RV % pred: 151 %
RV: 3.1 L
TLC % PRED: 91 %
TLC: 6 L

## 2014-06-16 LAB — BLOOD GAS, ARTERIAL
Acid-Base Excess: 0.9 mmol/L (ref 0.0–2.0)
BICARBONATE: 25 meq/L — AB (ref 20.0–24.0)
Drawn by: 252031
FIO2: 0.21 %
O2 Saturation: 93.8 %
PCO2 ART: 39.6 mmHg (ref 35.0–45.0)
PH ART: 7.417 (ref 7.350–7.450)
Patient temperature: 98.6
TCO2: 26.2 mmol/L (ref 0–100)
pO2, Arterial: 70.9 mmHg — ABNORMAL LOW (ref 80.0–100.0)

## 2014-06-16 LAB — TYPE AND SCREEN
ABO/RH(D): O POS
Antibody Screen: NEGATIVE

## 2014-06-16 LAB — MRSA PCR SCREENING: MRSA BY PCR: NEGATIVE

## 2014-06-16 LAB — ABO/RH: ABO/RH(D): O POS

## 2014-06-16 SURGERY — LEFT HEART CATHETERIZATION WITH CORONARY ANGIOGRAM
Anesthesia: LOCAL

## 2014-06-16 MED ORDER — SODIUM CHLORIDE 0.9 % IJ SOLN: 3.0000 mL | INTRAMUSCULAR | Status: DC | PRN

## 2014-06-16 MED ORDER — CHLORHEXIDINE GLUCONATE CLOTH 2 % EX PADS
6.0000 | MEDICATED_PAD | Freq: Once | CUTANEOUS | Status: AC
  Administered 2014-06-16: 6 via TOPICAL

## 2014-06-16 MED ORDER — VANCOMYCIN HCL 10 G IV SOLR
1250.0000 mg | INTRAVENOUS | Status: AC
  Administered 2014-06-17: 1250 mg via INTRAVENOUS
  Filled 2014-06-16: qty 1250

## 2014-06-16 MED ORDER — TEMAZEPAM 15 MG PO CAPS
15.0000 mg | ORAL_CAPSULE | Freq: Once | ORAL | Status: AC | PRN
  Administered 2014-06-16: 15 mg via ORAL
  Filled 2014-06-16: qty 1

## 2014-06-16 MED ORDER — ASPIRIN 81 MG PO CHEW
81.0000 mg | CHEWABLE_TABLET | ORAL | Status: AC
  Administered 2014-06-16: 81 mg via ORAL

## 2014-06-16 MED ORDER — LIDOCAINE HCL (PF) 1 % IJ SOLN
INTRAMUSCULAR | Status: AC
  Filled 2014-06-16: qty 30

## 2014-06-16 MED ORDER — HEPARIN (PORCINE) IN NACL 2-0.9 UNIT/ML-% IJ SOLN
INTRAMUSCULAR | Status: AC
  Filled 2014-06-16: qty 1500

## 2014-06-16 MED ORDER — NITROGLYCERIN 1 MG/10 ML FOR IR/CATH LAB
INTRA_ARTERIAL | Status: AC
  Filled 2014-06-16: qty 10

## 2014-06-16 MED ORDER — ALBUTEROL SULFATE (2.5 MG/3ML) 0.083% IN NEBU
2.5000 mg | INHALATION_SOLUTION | Freq: Once | RESPIRATORY_TRACT | Status: AC
  Administered 2014-06-16: 2.5 mg via RESPIRATORY_TRACT

## 2014-06-16 MED ORDER — FENOFIBRATE 160 MG PO TABS
160.0000 mg | ORAL_TABLET | Freq: Every day | ORAL | Status: DC
  Administered 2014-06-16: 160 mg via ORAL
  Filled 2014-06-16 (×2): qty 1

## 2014-06-16 MED ORDER — MAGNESIUM SULFATE 50 % IJ SOLN
40.0000 meq | INTRAMUSCULAR | Status: DC
  Filled 2014-06-16: qty 10

## 2014-06-16 MED ORDER — DOPAMINE-DEXTROSE 3.2-5 MG/ML-% IV SOLN
2.0000 ug/kg/min | INTRAVENOUS | Status: DC
  Filled 2014-06-16: qty 250

## 2014-06-16 MED ORDER — SODIUM CHLORIDE 0.9 % IV SOLN
INTRAVENOUS | Status: DC
  Administered 2014-06-16: 08:00:00 via INTRAVENOUS

## 2014-06-16 MED ORDER — ASPIRIN 81 MG PO CHEW: 81.0000 mg | CHEWABLE_TABLET | Freq: Every day | ORAL | Status: DC

## 2014-06-16 MED ORDER — EPINEPHRINE HCL 1 MG/ML IJ SOLN
0.5000 ug/min | INTRAVENOUS | Status: DC
  Filled 2014-06-16: qty 4

## 2014-06-16 MED ORDER — SODIUM CHLORIDE 0.9 % IV SOLN
INTRAVENOUS | Status: AC
  Administered 2014-06-17: 1.5 [IU]/h via INTRAVENOUS
  Filled 2014-06-16: qty 2.5

## 2014-06-16 MED ORDER — DEXMEDETOMIDINE HCL IN NACL 400 MCG/100ML IV SOLN
0.1000 ug/kg/h | INTRAVENOUS | Status: AC
  Administered 2014-06-17: 0.3 ug/kg/h via INTRAVENOUS
  Filled 2014-06-16: qty 100

## 2014-06-16 MED ORDER — DEXTROSE 5 % IV SOLN
750.0000 mg | INTRAVENOUS | Status: DC
  Filled 2014-06-16: qty 750

## 2014-06-16 MED ORDER — PLASMA-LYTE 148 IV SOLN
INTRAVENOUS | Status: AC
  Administered 2014-06-17: 09:00:00
  Filled 2014-06-16: qty 2.5

## 2014-06-16 MED ORDER — MORPHINE SULFATE 2 MG/ML IJ SOLN: 2.0000 mg | INTRAMUSCULAR | Status: DC | PRN

## 2014-06-16 MED ORDER — BISACODYL 5 MG PO TBEC
5.0000 mg | DELAYED_RELEASE_TABLET | Freq: Once | ORAL | Status: DC
  Filled 2014-06-16: qty 1

## 2014-06-16 MED ORDER — SODIUM CHLORIDE 0.9 % IV SOLN: INTRAVENOUS | Status: AC

## 2014-06-16 MED ORDER — DIAZEPAM 5 MG PO TABS
10.0000 mg | ORAL_TABLET | Freq: Once | ORAL | Status: AC
  Administered 2014-06-17: 10 mg via ORAL
  Filled 2014-06-16: qty 2

## 2014-06-16 MED ORDER — MIDAZOLAM HCL 2 MG/2ML IJ SOLN
INTRAMUSCULAR | Status: AC
  Filled 2014-06-16: qty 2

## 2014-06-16 MED ORDER — ONDANSETRON HCL 4 MG/2ML IJ SOLN: 4.0000 mg | Freq: Four times a day (QID) | INTRAMUSCULAR | Status: DC | PRN

## 2014-06-16 MED ORDER — NITROGLYCERIN 1 MG/10 ML FOR IR/CATH LAB
INTRA_ARTERIAL | Status: DC
Start: 2014-06-16 — End: 2014-06-16
  Filled 2014-06-16: qty 10

## 2014-06-16 MED ORDER — PHENYLEPHRINE HCL 10 MG/ML IJ SOLN
30.0000 ug/min | INTRAVENOUS | Status: AC
  Administered 2014-06-17: 25 ug/min via INTRAVENOUS
  Filled 2014-06-16: qty 2

## 2014-06-16 MED ORDER — DEXTROSE 5 % IV SOLN
1.5000 g | INTRAVENOUS | Status: AC
  Administered 2014-06-17: 1.5 g via INTRAVENOUS
  Administered 2014-06-17: .75 g via INTRAVENOUS
  Filled 2014-06-16: qty 1.5

## 2014-06-16 MED ORDER — NITROGLYCERIN IN D5W 200-5 MCG/ML-% IV SOLN
2.0000 ug/min | INTRAVENOUS | Status: AC
Start: 2014-06-17 — End: 2014-06-17
  Administered 2014-06-17: 5 ug/min via INTRAVENOUS
  Filled 2014-06-16: qty 250

## 2014-06-16 MED ORDER — VALSARTAN-HYDROCHLOROTHIAZIDE 320-25 MG PO TABS: 1.0000 | ORAL_TABLET | Freq: Every day | ORAL | Status: DC

## 2014-06-16 MED ORDER — POTASSIUM CHLORIDE 2 MEQ/ML IV SOLN
80.0000 meq | INTRAVENOUS | Status: DC
  Filled 2014-06-16: qty 40

## 2014-06-16 MED ORDER — FENTANYL CITRATE 0.05 MG/ML IJ SOLN
INTRAMUSCULAR | Status: AC
  Filled 2014-06-16: qty 2

## 2014-06-16 MED ORDER — ALPRAZOLAM 0.25 MG PO TABS
0.2500 mg | ORAL_TABLET | ORAL | Status: DC | PRN
  Administered 2014-06-16: 0.5 mg via ORAL
  Filled 2014-06-16: qty 2

## 2014-06-16 MED ORDER — HEPARIN SODIUM (PORCINE) 1000 UNIT/ML IJ SOLN
INTRAMUSCULAR | Status: AC
  Filled 2014-06-16: qty 1

## 2014-06-16 MED ORDER — SODIUM CHLORIDE 0.9 % IV SOLN
INTRAVENOUS | Status: DC
  Filled 2014-06-16: qty 30

## 2014-06-16 MED ORDER — METOPROLOL TARTRATE 12.5 MG HALF TABLET
12.5000 mg | ORAL_TABLET | Freq: Once | ORAL | Status: AC
  Administered 2014-06-17: 12.5 mg via ORAL
  Filled 2014-06-16: qty 1

## 2014-06-16 MED ORDER — IRBESARTAN 300 MG PO TABS
300.0000 mg | ORAL_TABLET | Freq: Every day | ORAL | Status: DC
  Administered 2014-06-16: 300 mg via ORAL
  Filled 2014-06-16 (×2): qty 1

## 2014-06-16 MED ORDER — VERAPAMIL HCL 2.5 MG/ML IV SOLN
INTRAVENOUS | Status: AC
Start: 2014-06-16 — End: 2014-06-16
  Filled 2014-06-16: qty 2

## 2014-06-16 MED ORDER — AMINOCAPROIC ACID 250 MG/ML IV SOLN
INTRAVENOUS | Status: AC
  Administered 2014-06-17: 69.8 mL/h via INTRAVENOUS
  Filled 2014-06-16: qty 40

## 2014-06-16 MED ORDER — ACETAMINOPHEN 325 MG PO TABS: 650.0000 mg | ORAL_TABLET | ORAL | Status: DC | PRN

## 2014-06-16 MED ORDER — OXYCODONE-ACETAMINOPHEN 5-325 MG PO TABS: 1.0000 | ORAL_TABLET | ORAL | Status: DC | PRN

## 2014-06-16 MED ORDER — SIMVASTATIN 40 MG PO TABS
40.0000 mg | ORAL_TABLET | Freq: Every day | ORAL | Status: DC
  Administered 2014-06-16: 40 mg via ORAL
  Filled 2014-06-16 (×3): qty 1

## 2014-06-16 MED ORDER — HYDROCHLOROTHIAZIDE 25 MG PO TABS
25.0000 mg | ORAL_TABLET | Freq: Every day | ORAL | Status: DC
Start: 2014-06-16 — End: 2014-06-17
  Administered 2014-06-16: 25 mg via ORAL
  Filled 2014-06-16 (×2): qty 1

## 2014-06-16 MED ORDER — CHLORHEXIDINE GLUCONATE CLOTH 2 % EX PADS
6.0000 | MEDICATED_PAD | Freq: Once | CUTANEOUS | Status: AC
  Administered 2014-06-17: 6 via TOPICAL

## 2014-06-16 MED ORDER — ASPIRIN 81 MG PO CHEW
CHEWABLE_TABLET | ORAL | Status: AC
  Filled 2014-06-16: qty 1

## 2014-06-16 NOTE — Progress Notes (Signed)
VASCULAR LAB PRELIMINARY  PRELIMINARY  PRELIMINARY  PRELIMINARY  Pre-op Cardiac Surgery  Carotid Findings:  Bilateral:  1-39% ICA stenosis.  Vertebral artery flow is antegrade.      Upper Extremity Right Left  Brachial Pressures triphasic 145 triphasic  Radial Waveforms triphasic triphasic  Ulnar Waveforms triphasic triphasic  Palmar Arch (Allen's Test) * **   Findings:  *Right palmar arch not done due to cardiac cath today via the right radial artery.  **Left:  Doppler waveforms decrease greater than 50% with ulnar compression and remain normal with radial compression.    Lower  Extremity Right Left  Dorsalis Pedis    Anterior Tibial    Posterior Tibial    Ankle/Brachial Indices      Findings:  Palpable pedal pulses x 4.   Darrell Hauk, RVT 06/16/2014, 6:16 PM

## 2014-06-16 NOTE — CV Procedure (Signed)
      Cardiac Catheterization Operative Report  ZIAIR PENSON [id number] 10/1/20159:29 AM Redge Gainer, MD  Procedure Performed:  1. Left Heart Catheterization 2. Selective Coronary Angiography 3. Left ventricular angiogram  Operator: Lauree Chandler, MD  Arterial access site:  Right radial artery.   Indication: 58 yo male with history of tobacco abuse, HTN, HLD with recent chest pain with exertion concerning for unstable angina.                                  Procedure Details: The risks, benefits, complications, treatment options, and expected outcomes were discussed with the patient. The patient and/or family concurred with the proposed plan, giving informed consent. The patient was brought to the cath lab after IV hydration was begun and oral premedication was given. The patient was further sedated with Versed and Fentanyl. The right wrist was assessed with a reverse Allens test which was positive. The right wrist was prepped and draped in a sterile fashion. 1% lidocaine was used for local anesthesia. Using the modified Seldinger access technique, a 5 French sheath was placed in the right radial artery. 3 mg Verapamil was given through the sheath. 4500 units IV heparin was given. Standard diagnostic catheters were used to perform selective coronary angiography. Ultimately I was able to best engage the RCA with a No-Torque catheter. A pigtail catheter was used to perform a left ventricular angiogram. The sheath was removed from the right radial artery and a Terumo hemostasis band was applied at the arteriotomy site on the right wrist.    There were no immediate complications. The patient was taken to the recovery area in stable condition.   Hemodynamic Findings: Central aortic pressure: 149/78 Left ventricular pressure: 154/9/16  Angiographic Findings:  Left main: Distal 10% stenosis.   Left Anterior Descending Artery: Large caliber vessel that courses to the apex. The  mid vessel is diffusely disease with diffuse 50% stenosis starting just past the Diagonal branch. There is a focal hazy 80-90% mid stenosis. The distal vessel has diffuse mild plaque. The diagonal branch is moderate in caliber with ostial 50% stenosis, long segment of 80% mid stenosis.   Circumflex Artery: Large caliber vessel with ostial 40% stenosis, mid 60% stenosis and termination into a moderate caliber obtuse marginal branch. The obtuse marginal branch has serial 95% stenoses.   Right Coronary Artery: Large dominant vessel with proximal to mid 80-90% stenosis. The PDA and PLA have minor plaque disease.   Left Ventricular Angiogram: LVEF=55%  Impression: 1. Severe triple vessel CAD 2. Unstable angina 3. Normal LV systolic function  Recommendations: Will ask CT surgery to see today to discuss CABG.        Complications:  None. The patient tolerated the procedure well.

## 2014-06-16 NOTE — Consult Note (Signed)
[address]       ,[place] [address]             [phone]      Cardiothoracic Surgery Consultation:   Reason for Consult: Severe multi-vessel coronary artery disease Referring Physician:  Lauree Chandler, MD  Eric Saunders is an 58 y.o. male.  HPI:   The patient is a 33 pk-year active smoker with hypertension and hyperlipidemia who says he feels great but was having some chest pain at work while lifting heavy items. The pain did not last long and there were no associated symptoms. He had a treadmill stress test showing ST depression peak exercise. Cath was recommended but he wanted to think about it further and was cathed today. This shows severe 3-vessel disease as noted below.  Past Medical History  Diagnosis Date  . Hypertension   . Hyperlipemia   . Coronary artery disease     Past Surgical History  Procedure Laterality Date  . Knee surgery      bilateral arthroscopic    Family History  Problem Relation Age of Onset  . Drug abuse Sister   . Healthy Brother   . Diabetes Sister   . Healthy Sister   . Healthy Sister   . Healthy Sister   . Healthy Sister   . Healthy Brother     Social History:  reports that he has been smoking Cigarettes.  He has a 30 pack-year smoking history. He has never used smokeless tobacco. He reports that he drinks alcohol. He reports that he does not use illicit drugs.  Allergies: No Known Allergies  Medications:  I have reviewed the patient's current medications. Prior to Admission:  Prescriptions prior to admission  Medication Sig Dispense Refill  . ALPRAZolam (XANAX) 0.5 MG tablet Take 0.5 mg by mouth 2 (two) times daily as needed for anxiety.      . fenofibrate 160 MG tablet Take 160 mg by mouth daily.      Marland Kitchen ibuprofen (ADVIL,MOTRIN) 800 MG tablet Take 800 mg by mouth as needed for mild pain.      Marland Kitchen omeprazole (PRILOSEC OTC) 20 MG tablet Take 20 mg by mouth as needed (when he eats spicy foods).        . simvastatin (ZOCOR) 40 MG tablet Take 40 mg by mouth daily.      . valsartan-hydrochlorothiazide (DIOVAN-HCT) 320-25 MG per tablet Take 1 tablet by mouth daily.       Scheduled: . albuterol  2.5 mg Nebulization Once  . [START ON 06/17/2014] aminocaproic acid (AMICAR) for OHS   Intravenous To OR  . aspirin      . [START ON 06/17/2014] aspirin  81 mg Oral Daily  . [START ON 06/17/2014] cefUROXime (ZINACEF)  IV  1.5 g Intravenous To OR  . [START ON 06/17/2014] cefUROXime (ZINACEF)  IV  750 mg Intravenous To OR  . [START ON 06/17/2014] dexmedetomidine  0.1-0.7 mcg/kg/hr Intravenous To OR  . [START ON 06/17/2014] DOPamine  2-20 mcg/kg/min Intravenous To OR  . [START ON 06/17/2014] epinephrine  0.5-20 mcg/min Intravenous To OR  . fenofibrate  160 mg Oral Daily  . [START ON 06/17/2014] heparin-papaverine-plasmalyte irrigation   Irrigation To OR  . [START ON 06/17/2014] heparin 30,000 units/NS 1000 mL solution for CELLSAVER   Other To OR  . irbesartan  300 mg Oral Daily   And  . hydrochlorothiazide  25 mg Oral Daily  . [START ON 06/17/2014] insulin (  NOVOLIN-R) infusion   Intravenous To OR  . [START ON 06/17/2014] magnesium sulfate  40 mEq Other To OR  . [START ON 06/17/2014] nitroGLYCERIN  2-200 mcg/min Intravenous To OR  . [START ON 06/17/2014] phenylephrine (NEO-SYNEPHRINE) Adult infusion  30-200 mcg/min Intravenous To OR  . [START ON 06/17/2014] potassium chloride  80 mEq Other To OR  . simvastatin  40 mg Oral q1800  . [START ON 06/17/2014] vancomycin  1,250 mg Intravenous To OR   Continuous:  IYM:EBRAXENMMHWKG, morphine injection, ondansetron (ZOFRAN) IV, oxyCODONE-acetaminophen Anti-infectives   Start     Dose/Rate Route Frequency Ordered Stop   06/17/14 0400  vancomycin (VANCOCIN) 1,250 mg in sodium chloride 0.9 % 250 mL IVPB     1,250 mg 166.7 mL/hr over 90 Minutes Intravenous To Surgery 06/16/14 1431 06/18/14 0400   06/17/14 0400  cefUROXime (ZINACEF) 1.5 g in dextrose 5 % 50 mL IVPB     1.5  g 100 mL/hr over 30 Minutes Intravenous To Surgery 06/16/14 1431 06/18/14 0400   06/17/14 0400  cefUROXime (ZINACEF) 750 mg in dextrose 5 % 50 mL IVPB     750 mg 100 mL/hr over 30 Minutes Intravenous To Surgery 06/16/14 1431 06/18/14 0400      No results found for this or any previous visit (from the past 48 hour(s)).  No results found.  Review of Systems  Constitutional: Negative for fever, chills, weight loss, malaise/fatigue and diaphoresis.  HENT: Negative.   Eyes: Negative.   Respiratory: Negative for cough and shortness of breath.   Cardiovascular: Positive for chest pain. Negative for palpitations, orthopnea, leg swelling and PND.  Gastrointestinal: Negative.   Genitourinary: Negative.   Musculoskeletal: Negative.   Skin: Negative.   Neurological: Negative.   Endo/Heme/Allergies: Negative.   Psychiatric/Behavioral: Negative.    Blood pressure 158/82, pulse 67, temperature 97.5 F (36.4 C), temperature source Oral, resp. rate 16, height 5\' 8"  (1.727 m), weight 90.719 kg (200 lb), SpO2 97.00%. Physical Exam  Constitutional: He is oriented to person, place, and time. He appears well-developed and well-nourished. No distress.  HENT:  Head: Normocephalic and atraumatic.  Mouth/Throat: Oropharynx is clear and moist.  Eyes: EOM are normal. Pupils are equal, round, and reactive to light.  Neck: Normal range of motion. Neck supple. No JVD present. No thyromegaly present.  Cardiovascular: Normal rate, regular rhythm, normal heart sounds and intact distal pulses.   No murmur heard. Respiratory: Effort normal and breath sounds normal. No respiratory distress. He has no rales.  GI: Soft. Bowel sounds are normal. He exhibits no distension and no mass. There is no tenderness.  Musculoskeletal: He exhibits no edema.  Lymphadenopathy:    He has no cervical adenopathy.  Neurological: He is alert and oriented to person, place, and time. He has normal strength. No cranial nerve deficit  or sensory deficit.  Skin: Skin is warm and dry.  Psychiatric: He has a normal mood and affect.    Cardiac Catheterization Operative Report  Eric Saunders  [id number]  10/1/20159:29 AM  Redge Gainer, MD  Procedure Performed:  1. Left Heart Catheterization 2. Selective Coronary Angiography 3. Left ventricular angiogram Operator: Lauree Chandler, MD  Arterial access site: Right radial artery.  Indication: 58 yo male with history of tobacco abuse, HTN, HLD with recent chest pain with exertion concerning for unstable angina.  Procedure Details:  The risks, benefits, complications, treatment options, and expected outcomes were discussed with the patient. The patient and/or family concurred with the proposed plan, giving informed  consent. The patient was brought to the cath lab after IV hydration was begun and oral premedication was given. The patient was further sedated with Versed and Fentanyl. The right wrist was assessed with a reverse Allens test which was positive. The right wrist was prepped and draped in a sterile fashion. 1% lidocaine was used for local anesthesia. Using the modified Seldinger access technique, a 5 French sheath was placed in the right radial artery. 3 mg Verapamil was given through the sheath. 4500 units IV heparin was given. Standard diagnostic catheters were used to perform selective coronary angiography. Ultimately I was able to best engage the RCA with a No-Torque catheter. A pigtail catheter was used to perform a left ventricular angiogram. The sheath was removed from the right radial artery and a Terumo hemostasis band was applied at the arteriotomy site on the right wrist.  There were no immediate complications. The patient was taken to the recovery area in stable condition.  Hemodynamic Findings:  Central aortic pressure: 149/78  Left ventricular pressure: 154/9/16  Angiographic Findings:  Left main: Distal 10% stenosis.  Left Anterior Descending Artery:  Large caliber vessel that courses to the apex. The mid vessel is diffusely disease with diffuse 50% stenosis starting just past the Diagonal branch. There is a focal hazy 80-90% mid stenosis. The distal vessel has diffuse mild plaque. The diagonal branch is moderate in caliber with ostial 50% stenosis, long segment of 80% mid stenosis.  Circumflex Artery: Large caliber vessel with ostial 40% stenosis, mid 60% stenosis and termination into a moderate caliber obtuse marginal branch. The obtuse marginal branch has serial 95% stenoses.  Right Coronary Artery: Large dominant vessel with proximal to mid 80-90% stenosis. The PDA and PLA have minor plaque disease.  Left Ventricular Angiogram: LVEF=55%  Impression:  1. Severe triple vessel CAD  2. Unstable angina  3. Normal LV systolic function  Recommendations: Will ask CT surgery to see today to discuss CABG.  Complications: None. The patient tolerated the procedure well.    Assessment/Plan:  1. Severe multi-vessel CAD with an abnormal stress test and unstable angina.  2. Hypertension  3. Hyperlipidemia  4. Tobacco abuse  I agree that CABG is the best treatment for this gentleman with severe 3-vessel CAD. I discussed the operative procedure with the patient and family including alternatives, benefits and risks; including but not limited to bleeding, blood transfusion, infection, stroke, myocardial infarction, graft failure, heart block requiring a permanent pacemaker, organ dysfunction, and death.  [name] understands and agrees to proceed.  We will schedule surgery for tomorrow.    BARTLE,BRYAN K 06/16/2014, 3:09 PM

## 2014-06-16 NOTE — Progress Notes (Signed)
Pt educated on not using right arm, pt seen pushing up in bed with right hand. Sherrie Mustache 12:42 PM

## 2014-06-16 NOTE — Interval H&P Note (Signed)
History and Physical Interval Note:  06/16/2014 8:36 AM  Eric Saunders  has presented today for cardiac cath with the diagnosis of chest pain, abnormal stress test. The various methods of treatment have been discussed with the patient and family. After consideration of risks, benefits and other options for treatment, the patient has consented to  Procedure(s): LEFT HEART CATHETERIZATION WITH CORONARY ANGIOGRAM (N/A) as a surgical intervention .  The patient's history has been reviewed, patient examined, no change in status, stable for surgery.  I have reviewed the patient's chart and labs.  Questions were answered to the patient's satisfaction.    Cath Lab Visit (complete for each Cath Lab visit)  Clinical Evaluation Leading to the Procedure:   ACS: No.  Non-ACS:    Anginal Classification: CCS II  Anti-ischemic medical therapy: No Therapy  Non-Invasive Test Results: High-risk stress test findings: cardiac mortality >3%/year  Prior CABG: No previous CABG        Renita Brocks

## 2014-06-16 NOTE — H&P (View-Only) (Signed)
 HPI The patient returns for evaluation of chest pain  He reported some discomfort at work when he is moving items. It is a sharp and fleeting discomfort in his left upper chest. He doesn't describe radiation to his neck or to his arms. There were no associated symptoms. He doesn't particularly get short of breath with this. He's not having PND or orthopnea. He reports that this has been a relatively stable pattern since we first met.  However, I did send him for a POET (Plain Old Exercise Treadmill).  This demonstrated ST segment depression at peak exercise. This suggested obstructive coronary disease. He did have a hypertensive blood pressure response. I suggested cardiac catheterization.  However, he deferred and wanted to come back and talk about this.  No Known Allergies  Current Outpatient Prescriptions  Medication Sig Dispense Refill  . ALPRAZolam (XANAX) 0.5 MG tablet TAKE ONE TABLET BY MOUTH TWICE DAILY  60 tablet  0  . fenofibrate 160 MG tablet Take 1 tablet (160 mg total) by mouth daily.  90 tablet  0  . ibuprofen (ADVIL,MOTRIN) 800 MG tablet TAKE ONE TABLET BY MOUTH AS NEEDED FOR PAIN  30 tablet  0  . simvastatin (ZOCOR) 40 MG tablet Take 40 mg by mouth daily.      . valsartan-hydrochlorothiazide (DIOVAN HCT) 320-25 MG per tablet Take 1 tablet by mouth daily.  30 tablet  3   No current facility-administered medications for this visit.    Past Medical History  Diagnosis Date  . Hypertension   . Hyperlipemia     Past Surgical History  Procedure Laterality Date  . Knee surgery      bilateral arthroscopic    ROS:  Positive for reflux.  Otherwise as stated in the HPI and negative for all other systems.  PHYSICAL EXAM BP 122/80  Pulse 89  Ht 5' 8" (1.727 m)  Wt 194 lb (87.998 kg)  BMI 29.50 kg/m2 GENERAL:  Well appearing HEENT:  Pupils equal round and reactive, fundi not visualized, oral mucosa unremarkable NECK:  No jugular venous distention, waveform within normal  limits, carotid upstroke brisk and symmetric, no bruits, no thyromegaly LYMPHATICS:  No cervical, inguinal adenopathy LUNGS:  Clear to auscultation bilaterally BACK:  No CVA tenderness CHEST:  Unremarkable HEART:  PMI not displaced or sustained,S1 and S2 within normal limits, no S3, no S4, no clicks, no rubs, no  murmurs ABD:  Flat, positive bowel sounds normal in frequency in pitch, no bruits, no rebound, no guarding, no midline pulsatile mass, no hepatomegaly, no splenomegaly EXT:  2 plus pulses throughout, no edema, no cyanosis no clubbing SKIN:  No rashes no nodules NEURO:  Cranial nerves II through XII grossly intact, motor grossly intact throughout PSYCH:  Cognitively intact, oriented to person place and time   ASSESSMENT AND PLAN  CHEST PAIN:   Given the ongoing pain and the abnormal stress test cath is indicated.  We discussed at length the risk benefits.  The patient understands that risks included but are not limited to stroke (1 in 1000), death (1 in 1000), kidney failure [usually temporary] (1 in 500), bleeding (1 in 200), allergic reaction [possibly serious] (1 in 200).  The patient understands and agrees to proceed.   He wants to proceed with cath.    TOBACCO:  We discussed a specific strategy for tobacco cessation.   SYNCOPE:  This cough syncope and as such is a coughing problem more than a syncope problem.  He has had no   further episodes.    HTN:  BP at rest is OK.  He is going to keep a BP diary since he had a hypertensive response with exercise.    

## 2014-06-16 NOTE — Progress Notes (Signed)
Rt hand saline lock-site c,d,i. Left hand IV w/0.9NS infusing

## 2014-06-17 ENCOUNTER — Encounter (HOSPITAL_COMMUNITY): Payer: Self-pay | Admitting: Certified Registered Nurse Anesthetist

## 2014-06-17 ENCOUNTER — Inpatient Hospital Stay (HOSPITAL_COMMUNITY): Payer: BC Managed Care – PPO

## 2014-06-17 ENCOUNTER — Encounter (HOSPITAL_COMMUNITY)
Admission: RE | Disposition: A | Discharge status: Home / Self Care | Payer: BC Managed Care – PPO | Source: Ambulatory Visit | Attending: Surgery

## 2014-06-17 ENCOUNTER — Encounter (HOSPITAL_COMMUNITY): Payer: BC Managed Care – PPO | Admitting: Certified Registered Nurse Anesthetist

## 2014-06-17 ENCOUNTER — Observation Stay (HOSPITAL_COMMUNITY): Payer: BC Managed Care – PPO | Admitting: Certified Registered Nurse Anesthetist

## 2014-06-17 DIAGNOSIS — R079 Chest pain, unspecified: Secondary | ICD-10-CM | POA: Diagnosis present

## 2014-06-17 DIAGNOSIS — E119 Type 2 diabetes mellitus without complications: Secondary | ICD-10-CM | POA: Diagnosis present

## 2014-06-17 DIAGNOSIS — I25110 Atherosclerotic heart disease of native coronary artery with unstable angina pectoris: Secondary | ICD-10-CM | POA: Diagnosis present

## 2014-06-17 DIAGNOSIS — E785 Hyperlipidemia, unspecified: Secondary | ICD-10-CM | POA: Diagnosis present

## 2014-06-17 DIAGNOSIS — I10 Essential (primary) hypertension: Secondary | ICD-10-CM | POA: Diagnosis present

## 2014-06-17 DIAGNOSIS — I2510 Atherosclerotic heart disease of native coronary artery without angina pectoris: Secondary | ICD-10-CM

## 2014-06-17 DIAGNOSIS — Z79899 Other long term (current) drug therapy: Secondary | ICD-10-CM | POA: Diagnosis not present

## 2014-06-17 DIAGNOSIS — F17210 Nicotine dependence, cigarettes, uncomplicated: Secondary | ICD-10-CM | POA: Diagnosis present

## 2014-06-17 HISTORY — PX: TEE WITHOUT CARDIOVERSION: SHX5443

## 2014-06-17 HISTORY — PX: CORONARY ARTERY BYPASS GRAFT: SHX141

## 2014-06-17 LAB — CBC
HEMATOCRIT: 42.9 % (ref 39.0–52.0)
HEMATOCRIT: 31.5 % — AB (ref 39.0–52.0)
HEMATOCRIT: 34 % — AB (ref 39.0–52.0)
HEMOGLOBIN: 14.3 g/dL (ref 13.0–17.0)
Hemoglobin: 10.6 g/dL — ABNORMAL LOW (ref 13.0–17.0)
Hemoglobin: 11.6 g/dL — ABNORMAL LOW (ref 13.0–17.0)
MCH: 28.8 pg (ref 26.0–34.0)
MCH: 29 pg (ref 26.0–34.0)
MCH: 29.4 pg (ref 26.0–34.0)
MCHC: 33.3 g/dL (ref 30.0–36.0)
MCHC: 33.7 g/dL (ref 30.0–36.0)
MCHC: 34.1 g/dL (ref 30.0–36.0)
MCV: 86.3 fL (ref 78.0–100.0)
MCV: 86.3 fL (ref 78.0–100.0)
MCV: 86.1 fL (ref 78.0–100.0)
Platelets: 338 10*3/uL (ref 150–400)
Platelets: 187 10*3/uL (ref 150–400)
Platelets: 254 10*3/uL (ref 150–400)
RBC: 4.97 MIL/uL (ref 4.22–5.81)
RBC: 3.65 MIL/uL — ABNORMAL LOW (ref 4.22–5.81)
RBC: 3.95 MIL/uL — AB (ref 4.22–5.81)
RDW: 13.4 % (ref 11.5–15.5)
RDW: 13.3 % (ref 11.5–15.5)
RDW: 13.3 % (ref 11.5–15.5)
WBC: 8.5 10*3/uL (ref 4.0–10.5)
WBC: 11.4 10*3/uL — ABNORMAL HIGH (ref 4.0–10.5)
WBC: 14.5 10*3/uL — AB (ref 4.0–10.5)

## 2014-06-17 LAB — POCT I-STAT, CHEM 8
BUN: 18 mg/dL (ref 6–23)
BUN: 17 mg/dL (ref 6–23)
BUN: 16 mg/dL (ref 6–23)
BUN: 16 mg/dL (ref 6–23)
BUN: 17 mg/dL (ref 6–23)
BUN: 15 mg/dL (ref 6–23)
CALCIUM ION: 1.07 mmol/L — AB (ref 1.12–1.23)
CALCIUM ION: 1.07 mmol/L — AB (ref 1.12–1.23)
CHLORIDE: 96 meq/L (ref 96–112)
CHLORIDE: 100 meq/L (ref 96–112)
CHLORIDE: 97 meq/L (ref 96–112)
CREATININE: 0.9 mg/dL (ref 0.50–1.35)
CREATININE: 0.9 mg/dL (ref 0.50–1.35)
CREATININE: 1 mg/dL (ref 0.50–1.35)
Calcium, Ion: 1.19 mmol/L (ref 1.12–1.23)
Calcium, Ion: 1.22 mmol/L (ref 1.12–1.23)
Calcium, Ion: 1.06 mmol/L — ABNORMAL LOW (ref 1.12–1.23)
Calcium, Ion: 1.18 mmol/L (ref 1.12–1.23)
Chloride: 102 mEq/L (ref 96–112)
Chloride: 102 mEq/L (ref 96–112)
Chloride: 107 mEq/L (ref 96–112)
Creatinine, Ser: 0.9 mg/dL (ref 0.50–1.35)
Creatinine, Ser: 0.9 mg/dL (ref 0.50–1.35)
Creatinine, Ser: 1.1 mg/dL (ref 0.50–1.35)
GLUCOSE: 83 mg/dL (ref 70–99)
GLUCOSE: 98 mg/dL (ref 70–99)
Glucose, Bld: 110 mg/dL — ABNORMAL HIGH (ref 70–99)
Glucose, Bld: 94 mg/dL (ref 70–99)
Glucose, Bld: 93 mg/dL (ref 70–99)
Glucose, Bld: 125 mg/dL — ABNORMAL HIGH (ref 70–99)
HCT: 41 % (ref 39.0–52.0)
HCT: 37 % — ABNORMAL LOW (ref 39.0–52.0)
HCT: 31 % — ABNORMAL LOW (ref 39.0–52.0)
HCT: 27 % — ABNORMAL LOW (ref 39.0–52.0)
HCT: 37 % — ABNORMAL LOW (ref 39.0–52.0)
HEMATOCRIT: 29 % — AB (ref 39.0–52.0)
Hemoglobin: 13.9 g/dL (ref 13.0–17.0)
Hemoglobin: 12.6 g/dL — ABNORMAL LOW (ref 13.0–17.0)
Hemoglobin: 9.9 g/dL — ABNORMAL LOW (ref 13.0–17.0)
Hemoglobin: 10.5 g/dL — ABNORMAL LOW (ref 13.0–17.0)
Hemoglobin: 9.2 g/dL — ABNORMAL LOW (ref 13.0–17.0)
Hemoglobin: 12.6 g/dL — ABNORMAL LOW (ref 13.0–17.0)
POTASSIUM: 3.7 meq/L (ref 3.7–5.3)
POTASSIUM: 4 meq/L (ref 3.7–5.3)
POTASSIUM: 4.6 meq/L (ref 3.7–5.3)
Potassium: 3.8 mEq/L (ref 3.7–5.3)
Potassium: 3.9 mEq/L (ref 3.7–5.3)
Potassium: 3.9 mEq/L (ref 3.7–5.3)
SODIUM: 140 meq/L (ref 137–147)
SODIUM: 139 meq/L (ref 137–147)
Sodium: 140 mEq/L (ref 137–147)
Sodium: 137 mEq/L (ref 137–147)
Sodium: 138 mEq/L (ref 137–147)
Sodium: 136 mEq/L — ABNORMAL LOW (ref 137–147)
TCO2: 26 mmol/L (ref 0–100)
TCO2: 27 mmol/L (ref 0–100)
TCO2: 25 mmol/L (ref 0–100)
TCO2: 26 mmol/L (ref 0–100)
TCO2: 26 mmol/L (ref 0–100)
TCO2: 24 mmol/L (ref 0–100)

## 2014-06-17 LAB — POCT I-STAT 3, ART BLOOD GAS (G3+)
Acid-Base Excess: 2 mmol/L (ref 0.0–2.0)
Acid-base deficit: 1 mmol/L (ref 0.0–2.0)
Acid-base deficit: 3 mmol/L — ABNORMAL HIGH (ref 0.0–2.0)
Acid-base deficit: 1 mmol/L (ref 0.0–2.0)
BICARBONATE: 27.3 meq/L — AB (ref 20.0–24.0)
BICARBONATE: 23.7 meq/L (ref 20.0–24.0)
BICARBONATE: 25.1 meq/L — AB (ref 20.0–24.0)
Bicarbonate: 26.1 mEq/L — ABNORMAL HIGH (ref 20.0–24.0)
O2 Saturation: 100 %
O2 Saturation: 95 %
O2 Saturation: 91 %
O2 Saturation: 90 %
PCO2 ART: 49.2 mmHg — AB (ref 35.0–45.0)
PH ART: 7.325 — AB (ref 7.350–7.450)
PH ART: 7.336 — AB (ref 7.350–7.450)
PO2 ART: 312 mmHg — AB (ref 80.0–100.0)
PO2 ART: 79 mmHg — AB (ref 80.0–100.0)
PO2 ART: 72 mmHg — AB (ref 80.0–100.0)
PO2 ART: 66 mmHg — AB (ref 80.0–100.0)
Patient temperature: 35.5
Patient temperature: 37.8
Patient temperature: 38.3
TCO2: 29 mmol/L (ref 0–100)
TCO2: 28 mmol/L (ref 0–100)
TCO2: 25 mmol/L (ref 0–100)
TCO2: 26 mmol/L (ref 0–100)
pCO2 arterial: 44.6 mmHg (ref 35.0–45.0)
pCO2 arterial: 48.4 mmHg — ABNORMAL HIGH (ref 35.0–45.0)
pCO2 arterial: 47.7 mmHg — ABNORMAL HIGH (ref 35.0–45.0)
pH, Arterial: 7.395 (ref 7.350–7.450)
pH, Arterial: 7.302 — ABNORMAL LOW (ref 7.350–7.450)

## 2014-06-17 LAB — HEMOGLOBIN AND HEMATOCRIT, BLOOD
HCT: 30.8 % — ABNORMAL LOW (ref 39.0–52.0)
HEMOGLOBIN: 10.6 g/dL — AB (ref 13.0–17.0)

## 2014-06-17 LAB — BASIC METABOLIC PANEL
Anion gap: 15 (ref 5–15)
BUN: 18 mg/dL (ref 6–23)
CO2: 26 mEq/L (ref 19–32)
CREATININE: 1.17 mg/dL (ref 0.50–1.35)
Calcium: 9 mg/dL (ref 8.4–10.5)
Chloride: 101 mEq/L (ref 96–112)
GFR calc non Af Amer: 68 mL/min — ABNORMAL LOW (ref >90)
GFR, EST AFRICAN AMERICAN: 79 mL/min — AB (ref >90)
Glucose, Bld: 92 mg/dL (ref 70–99)
Potassium: 3.6 mEq/L — ABNORMAL LOW (ref 3.7–5.3)
Sodium: 142 mEq/L (ref 137–147)

## 2014-06-17 LAB — POCT I-STAT 4, (NA,K, GLUC, HGB,HCT)
GLUCOSE: 109 mg/dL — AB (ref 70–99)
HCT: 28 % — ABNORMAL LOW (ref 39.0–52.0)
Hemoglobin: 9.5 g/dL — ABNORMAL LOW (ref 13.0–17.0)
POTASSIUM: 3.6 meq/L — AB (ref 3.7–5.3)
Sodium: 138 mEq/L (ref 137–147)

## 2014-06-17 LAB — HEMOGLOBIN A1C
Hgb A1c MFr Bld: 6 % — ABNORMAL HIGH (ref <5.7)
Mean Plasma Glucose: 126 mg/dL — ABNORMAL HIGH (ref <117)

## 2014-06-17 LAB — CREATININE, SERUM
CREATININE: 1.09 mg/dL (ref 0.50–1.35)
GFR, EST AFRICAN AMERICAN: 86 mL/min — AB (ref >90)
GFR, EST NON AFRICAN AMERICAN: 74 mL/min — AB (ref >90)

## 2014-06-17 LAB — PLATELET COUNT: Platelets: 240 10*3/uL (ref 150–400)

## 2014-06-17 LAB — GLUCOSE, CAPILLARY
GLUCOSE-CAPILLARY: 110 mg/dL — AB (ref 70–99)
Glucose-Capillary: 115 mg/dL — ABNORMAL HIGH (ref 70–99)
Glucose-Capillary: 106 mg/dL — ABNORMAL HIGH (ref 70–99)

## 2014-06-17 LAB — PROTIME-INR
INR: 1.39 (ref 0.00–1.49)
Prothrombin Time: 17.1 seconds — ABNORMAL HIGH (ref 11.6–15.2)

## 2014-06-17 LAB — APTT: APTT: 35 s (ref 24–37)

## 2014-06-17 LAB — MAGNESIUM: Magnesium: 2.9 mg/dL — ABNORMAL HIGH (ref 1.5–2.5)

## 2014-06-17 SURGERY — CORONARY ARTERY BYPASS GRAFTING (CABG)
Anesthesia: General | Site: Chest

## 2014-06-17 MED ORDER — DEXTROSE 5 % IV SOLN
1.5000 g | Freq: Two times a day (BID) | INTRAVENOUS | Status: AC
  Administered 2014-06-17 – 2014-06-19 (×4): 1.5 g via INTRAVENOUS
  Filled 2014-06-17 (×4): qty 1.5

## 2014-06-17 MED ORDER — SUCCINYLCHOLINE CHLORIDE 20 MG/ML IJ SOLN
INTRAMUSCULAR | Status: DC | PRN
  Administered 2014-06-17: 140 mg via INTRAVENOUS

## 2014-06-17 MED ORDER — POTASSIUM CHLORIDE 10 MEQ/50ML IV SOLN
10.0000 meq | INTRAVENOUS | Status: AC
  Administered 2014-06-17 (×3): 10 meq via INTRAVENOUS

## 2014-06-17 MED ORDER — INSULIN ASPART 100 UNIT/ML ~~LOC~~ SOLN
0.0000 [IU] | SUBCUTANEOUS | Status: DC
  Administered 2014-06-18: 2 [IU] via SUBCUTANEOUS

## 2014-06-17 MED ORDER — PANTOPRAZOLE SODIUM 40 MG PO TBEC
40.0000 mg | DELAYED_RELEASE_TABLET | Freq: Every day | ORAL | Status: DC
  Administered 2014-06-19 – 2014-06-23 (×5): 40 mg via ORAL
  Filled 2014-06-17 (×4): qty 1

## 2014-06-17 MED ORDER — HEPARIN SODIUM (PORCINE) 1000 UNIT/ML IJ SOLN
INTRAMUSCULAR | Status: AC
  Filled 2014-06-17: qty 1

## 2014-06-17 MED ORDER — ACETAMINOPHEN 160 MG/5ML PO SOLN
1000.0000 mg | Freq: Four times a day (QID) | ORAL | Status: AC
  Filled 2014-06-17: qty 40.6

## 2014-06-17 MED ORDER — VECURONIUM BROMIDE 10 MG IV SOLR
INTRAVENOUS | Status: DC | PRN
  Administered 2014-06-17 (×4): 5 mg via INTRAVENOUS

## 2014-06-17 MED ORDER — BISACODYL 10 MG RE SUPP: 10.0000 mg | Freq: Every day | RECTAL | Status: DC

## 2014-06-17 MED ORDER — LACTATED RINGERS IV SOLN
INTRAVENOUS | Status: DC | PRN
  Administered 2014-06-17 (×2): via INTRAVENOUS

## 2014-06-17 MED ORDER — FENTANYL CITRATE 0.05 MG/ML IJ SOLN
INTRAMUSCULAR | Status: AC
Start: 2014-06-17 — End: 2014-06-17
  Filled 2014-06-17: qty 5

## 2014-06-17 MED ORDER — SODIUM CHLORIDE 0.9 % IV SOLN
INTRAVENOUS | Status: DC
  Administered 2014-06-17: 20 mL/h via INTRAVENOUS

## 2014-06-17 MED ORDER — OXYCODONE HCL 5 MG PO TABS
5.0000 mg | ORAL_TABLET | ORAL | Status: DC | PRN
  Administered 2014-06-17: 5 mg via ORAL
  Administered 2014-06-18 (×3): 10 mg via ORAL
  Administered 2014-06-18 (×2): 5 mg via ORAL
  Administered 2014-06-18 – 2014-06-23 (×13): 10 mg via ORAL
  Filled 2014-06-17: qty 1
  Filled 2014-06-17 (×3): qty 2
  Filled 2014-06-17: qty 1
  Filled 2014-06-17: qty 2
  Filled 2014-06-17: qty 1
  Filled 2014-06-17 (×4): qty 2
  Filled 2014-06-17 (×2): qty 1
  Filled 2014-06-17 (×7): qty 2

## 2014-06-17 MED ORDER — BISACODYL 5 MG PO TBEC
10.0000 mg | DELAYED_RELEASE_TABLET | Freq: Every day | ORAL | Status: DC
  Administered 2014-06-18 – 2014-06-22 (×5): 10 mg via ORAL
  Filled 2014-06-17 (×5): qty 2

## 2014-06-17 MED ORDER — CETYLPYRIDINIUM CHLORIDE 0.05 % MT LIQD
7.0000 mL | Freq: Two times a day (BID) | OROMUCOSAL | Status: DC
  Administered 2014-06-17 – 2014-06-22 (×9): 7 mL via OROMUCOSAL

## 2014-06-17 MED ORDER — STERILE WATER FOR INJECTION IJ SOLN
INTRAMUSCULAR | Status: AC
  Filled 2014-06-17: qty 10

## 2014-06-17 MED ORDER — TRAMADOL HCL 50 MG PO TABS
50.0000 mg | ORAL_TABLET | ORAL | Status: DC | PRN
  Administered 2014-06-19 – 2014-06-20 (×5): 100 mg via ORAL
  Administered 2014-06-21: 50 mg via ORAL
  Filled 2014-06-17 (×4): qty 2
  Filled 2014-06-17: qty 1
  Filled 2014-06-17: qty 2

## 2014-06-17 MED ORDER — ASPIRIN 81 MG PO CHEW
324.0000 mg | CHEWABLE_TABLET | Freq: Every day | ORAL | Status: DC
  Administered 2014-06-20 – 2014-06-22 (×2): 324 mg
  Filled 2014-06-17 (×2): qty 4

## 2014-06-17 MED ORDER — GLYCOPYRROLATE 0.2 MG/ML IJ SOLN
INTRAMUSCULAR | Status: DC | PRN
  Administered 2014-06-17: 0.2 mg via INTRAVENOUS

## 2014-06-17 MED ORDER — ALBUMIN HUMAN 5 % IV SOLN
INTRAVENOUS | Status: DC | PRN
  Administered 2014-06-17 (×2): via INTRAVENOUS

## 2014-06-17 MED ORDER — MIDAZOLAM HCL 2 MG/2ML IJ SOLN: 2.0000 mg | INTRAMUSCULAR | Status: DC | PRN

## 2014-06-17 MED ORDER — LACTATED RINGERS IV SOLN
INTRAVENOUS | Status: DC
  Administered 2014-06-17: 20 mL/h via INTRAVENOUS

## 2014-06-17 MED ORDER — PROPOFOL 10 MG/ML IV BOLUS
INTRAVENOUS | Status: DC | PRN
  Administered 2014-06-17: 80 mg via INTRAVENOUS

## 2014-06-17 MED ORDER — FENTANYL CITRATE 0.05 MG/ML IJ SOLN
INTRAMUSCULAR | Status: AC
  Filled 2014-06-17: qty 5

## 2014-06-17 MED ORDER — MORPHINE SULFATE 2 MG/ML IJ SOLN
2.0000 mg | INTRAMUSCULAR | Status: DC | PRN
  Administered 2014-06-17: 2 mg via INTRAVENOUS
  Administered 2014-06-17: 4 mg via INTRAVENOUS
  Administered 2014-06-17 – 2014-06-18 (×2): 2 mg via INTRAVENOUS
  Administered 2014-06-18 (×3): 4 mg via INTRAVENOUS
  Administered 2014-06-18: 2 mg via INTRAVENOUS
  Administered 2014-06-19: 4 mg via INTRAVENOUS
  Administered 2014-06-19: 2 mg via INTRAVENOUS
  Administered 2014-06-19: 4 mg via INTRAVENOUS
  Administered 2014-06-20: 2 mg via INTRAVENOUS
  Filled 2014-06-17 (×2): qty 1
  Filled 2014-06-17: qty 2
  Filled 2014-06-17: qty 1
  Filled 2014-06-17: qty 2
  Filled 2014-06-17: qty 1
  Filled 2014-06-17 (×2): qty 2
  Filled 2014-06-17: qty 1
  Filled 2014-06-17: qty 2
  Filled 2014-06-17: qty 1
  Filled 2014-06-17: qty 2
  Filled 2014-06-17: qty 1

## 2014-06-17 MED ORDER — ASPIRIN EC 325 MG PO TBEC
325.0000 mg | DELAYED_RELEASE_TABLET | Freq: Every day | ORAL | Status: DC
  Administered 2014-06-18 – 2014-06-23 (×4): 325 mg via ORAL
  Filled 2014-06-17 (×6): qty 1

## 2014-06-17 MED ORDER — MAGNESIUM SULFATE 4000MG/100ML IJ SOLN
4.0000 g | Freq: Once | INTRAMUSCULAR | Status: AC
  Administered 2014-06-17: 4 g via INTRAVENOUS
  Filled 2014-06-17: qty 100

## 2014-06-17 MED ORDER — SODIUM CHLORIDE 0.9 % IV SOLN
INTRAVENOUS | Status: DC
  Filled 2014-06-17: qty 2.5

## 2014-06-17 MED ORDER — HEMOSTATIC AGENTS (NO CHARGE) OPTIME
TOPICAL | Status: DC | PRN
  Administered 2014-06-17: 1 via TOPICAL

## 2014-06-17 MED ORDER — DEXMEDETOMIDINE HCL IN NACL 200 MCG/50ML IV SOLN: 0.0000 ug/kg/h | INTRAVENOUS | Status: DC

## 2014-06-17 MED ORDER — SUCCINYLCHOLINE CHLORIDE 20 MG/ML IJ SOLN
INTRAMUSCULAR | Status: AC
  Filled 2014-06-17: qty 1

## 2014-06-17 MED ORDER — ROCURONIUM BROMIDE 50 MG/5ML IV SOLN
INTRAVENOUS | Status: AC
  Filled 2014-06-17: qty 1

## 2014-06-17 MED ORDER — GLYCOPYRROLATE 0.2 MG/ML IJ SOLN
INTRAMUSCULAR | Status: AC
  Filled 2014-06-17: qty 1

## 2014-06-17 MED ORDER — MIDAZOLAM HCL 10 MG/2ML IJ SOLN
INTRAMUSCULAR | Status: AC
  Filled 2014-06-17: qty 2

## 2014-06-17 MED ORDER — ARTIFICIAL TEARS OP OINT
TOPICAL_OINTMENT | OPHTHALMIC | Status: AC
  Filled 2014-06-17: qty 3.5

## 2014-06-17 MED ORDER — ROCURONIUM BROMIDE 100 MG/10ML IV SOLN
INTRAVENOUS | Status: DC | PRN
  Administered 2014-06-17: 50 mg via INTRAVENOUS

## 2014-06-17 MED ORDER — VANCOMYCIN HCL IN DEXTROSE 1-5 GM/200ML-% IV SOLN
1000.0000 mg | Freq: Once | INTRAVENOUS | Status: AC
Start: 2014-06-17 — End: 2014-06-17
  Administered 2014-06-17: 1000 mg via INTRAVENOUS
  Filled 2014-06-17: qty 200

## 2014-06-17 MED ORDER — ACETAMINOPHEN 160 MG/5ML PO SOLN: 650.0000 mg | Freq: Once | ORAL | Status: AC

## 2014-06-17 MED ORDER — PROPOFOL 10 MG/ML IV BOLUS
INTRAVENOUS | Status: AC
  Filled 2014-06-17: qty 20

## 2014-06-17 MED ORDER — ACETAMINOPHEN 650 MG RE SUPP
650.0000 mg | Freq: Once | RECTAL | Status: AC
  Administered 2014-06-17: 650 mg via RECTAL

## 2014-06-17 MED ORDER — NITROGLYCERIN IN D5W 200-5 MCG/ML-% IV SOLN: 0.0000 ug/min | INTRAVENOUS | Status: DC

## 2014-06-17 MED ORDER — LIDOCAINE HCL (CARDIAC) 20 MG/ML IV SOLN
INTRAVENOUS | Status: DC | PRN
  Administered 2014-06-17: 50 mg via INTRAVENOUS

## 2014-06-17 MED ORDER — DOCUSATE SODIUM 100 MG PO CAPS
200.0000 mg | ORAL_CAPSULE | Freq: Every day | ORAL | Status: DC
  Administered 2014-06-18 – 2014-06-22 (×5): 200 mg via ORAL
  Filled 2014-06-17 (×7): qty 2

## 2014-06-17 MED ORDER — MIDAZOLAM HCL 2 MG/2ML IJ SOLN
INTRAMUSCULAR | Status: AC
  Filled 2014-06-17: qty 2

## 2014-06-17 MED ORDER — THROMBIN 20000 UNITS EX SOLR
OROMUCOSAL | Status: DC | PRN
  Administered 2014-06-17 (×3): via TOPICAL

## 2014-06-17 MED ORDER — ONDANSETRON HCL 4 MG/2ML IJ SOLN: 4.0000 mg | Freq: Four times a day (QID) | INTRAMUSCULAR | Status: DC | PRN

## 2014-06-17 MED ORDER — SODIUM BICARBONATE 8.4 % IV SOLN
50.0000 meq | Freq: Once | INTRAVENOUS | Status: AC
  Administered 2014-06-17: 50 meq via INTRAVENOUS

## 2014-06-17 MED ORDER — SODIUM CHLORIDE 0.9 % IJ SOLN
3.0000 mL | Freq: Two times a day (BID) | INTRAMUSCULAR | Status: DC
  Administered 2014-06-18 – 2014-06-23 (×10): 3 mL via INTRAVENOUS

## 2014-06-17 MED ORDER — THROMBIN 20000 UNITS EX SOLR
CUTANEOUS | Status: AC
  Filled 2014-06-17: qty 20000

## 2014-06-17 MED ORDER — METOPROLOL TARTRATE 12.5 MG HALF TABLET
12.5000 mg | ORAL_TABLET | Freq: Two times a day (BID) | ORAL | Status: DC
Start: 2014-06-17 — End: 2014-06-23
  Administered 2014-06-18 – 2014-06-23 (×10): 12.5 mg via ORAL
  Filled 2014-06-17 (×13): qty 1

## 2014-06-17 MED ORDER — VECURONIUM BROMIDE 10 MG IV SOLR
INTRAVENOUS | Status: AC
Start: 2014-06-17 — End: 2014-06-17
  Filled 2014-06-17: qty 10

## 2014-06-17 MED ORDER — CHLORHEXIDINE GLUCONATE 0.12 % MT SOLN
15.0000 mL | Freq: Two times a day (BID) | OROMUCOSAL | Status: DC
  Administered 2014-06-17 (×2): 15 mL via OROMUCOSAL
  Filled 2014-06-17 (×2): qty 15

## 2014-06-17 MED ORDER — THROMBIN 20000 UNITS EX SOLR
CUTANEOUS | Status: DC | PRN
  Administered 2014-06-17: 20000 [IU] via TOPICAL

## 2014-06-17 MED ORDER — LACTATED RINGERS IV SOLN
INTRAVENOUS | Status: DC | PRN
  Administered 2014-06-17 (×2): via INTRAVENOUS

## 2014-06-17 MED ORDER — METOPROLOL TARTRATE 25 MG/10 ML ORAL SUSPENSION
12.5000 mg | Freq: Two times a day (BID) | ORAL | Status: DC
  Filled 2014-06-17 (×13): qty 5

## 2014-06-17 MED ORDER — SODIUM CHLORIDE 0.9 % IJ SOLN: 3.0000 mL | INTRAMUSCULAR | Status: DC | PRN

## 2014-06-17 MED ORDER — ALBUMIN HUMAN 5 % IV SOLN
250.0000 mL | INTRAVENOUS | Status: DC | PRN
  Administered 2014-06-17 (×2): 250 mL via INTRAVENOUS

## 2014-06-17 MED ORDER — ACETAMINOPHEN 500 MG PO TABS
1000.0000 mg | ORAL_TABLET | Freq: Four times a day (QID) | ORAL | Status: AC
  Administered 2014-06-17 – 2014-06-22 (×18): 1000 mg via ORAL
  Filled 2014-06-17 (×24): qty 2

## 2014-06-17 MED ORDER — LACTATED RINGERS IV SOLN: 500.0000 mL | Freq: Once | INTRAVENOUS | Status: AC | PRN

## 2014-06-17 MED ORDER — SODIUM CHLORIDE 0.9 % IV SOLN: 250.0000 mL | INTRAVENOUS | Status: DC

## 2014-06-17 MED ORDER — PROTAMINE SULFATE 10 MG/ML IV SOLN
INTRAVENOUS | Status: DC | PRN
  Administered 2014-06-17: 10 mg via INTRAVENOUS
  Administered 2014-06-17: 50 mg via INTRAVENOUS
  Administered 2014-06-17: 100 mg via INTRAVENOUS
  Administered 2014-06-17: 120 mg via INTRAVENOUS

## 2014-06-17 MED ORDER — METOPROLOL TARTRATE 1 MG/ML IV SOLN: 2.5000 mg | INTRAVENOUS | Status: DC | PRN

## 2014-06-17 MED ORDER — HEPARIN SODIUM (PORCINE) 1000 UNIT/ML IJ SOLN
INTRAMUSCULAR | Status: DC | PRN
  Administered 2014-06-17: 22000 [IU] via INTRAVENOUS
  Administered 2014-06-17: 10000 [IU] via INTRAVENOUS

## 2014-06-17 MED ORDER — ARTIFICIAL TEARS OP OINT
TOPICAL_OINTMENT | OPHTHALMIC | Status: DC | PRN
  Administered 2014-06-17: 1 via OPHTHALMIC

## 2014-06-17 MED ORDER — VECURONIUM BROMIDE 10 MG IV SOLR
INTRAVENOUS | Status: AC
  Filled 2014-06-17: qty 10

## 2014-06-17 MED ORDER — INSULIN REGULAR BOLUS VIA INFUSION
0.0000 [IU] | Freq: Three times a day (TID) | INTRAVENOUS | Status: DC
  Filled 2014-06-17: qty 10

## 2014-06-17 MED ORDER — FAMOTIDINE IN NACL 20-0.9 MG/50ML-% IV SOLN
20.0000 mg | Freq: Two times a day (BID) | INTRAVENOUS | Status: DC
  Administered 2014-06-17: 20 mg via INTRAVENOUS

## 2014-06-17 MED ORDER — SODIUM CHLORIDE 0.45 % IV SOLN
INTRAVENOUS | Status: DC
  Administered 2014-06-17: 20 mL/h via INTRAVENOUS

## 2014-06-17 MED ORDER — PROTAMINE SULFATE 10 MG/ML IV SOLN
INTRAVENOUS | Status: AC
  Filled 2014-06-17: qty 25

## 2014-06-17 MED ORDER — FENTANYL CITRATE 0.05 MG/ML IJ SOLN
INTRAMUSCULAR | Status: DC | PRN
  Administered 2014-06-17: 50 ug via INTRAVENOUS
  Administered 2014-06-17: 150 ug via INTRAVENOUS
  Administered 2014-06-17 (×2): 250 ug via INTRAVENOUS
  Administered 2014-06-17: 400 ug via INTRAVENOUS
  Administered 2014-06-17: 100 ug via INTRAVENOUS
  Administered 2014-06-17: 500 ug via INTRAVENOUS
  Administered 2014-06-17: 100 ug via INTRAVENOUS
  Administered 2014-06-17: 50 ug via INTRAVENOUS
  Administered 2014-06-17: 150 ug via INTRAVENOUS

## 2014-06-17 MED ORDER — PHENYLEPHRINE HCL 10 MG/ML IJ SOLN
0.0000 ug/min | INTRAVENOUS | Status: DC
  Administered 2014-06-18: 35 ug/min via INTRAVENOUS
  Filled 2014-06-17 (×3): qty 2

## 2014-06-17 MED ORDER — LIDOCAINE HCL (CARDIAC) 20 MG/ML IV SOLN
INTRAVENOUS | Status: AC
  Filled 2014-06-17: qty 5

## 2014-06-17 MED ORDER — CETYLPYRIDINIUM CHLORIDE 0.05 % MT LIQD
7.0000 mL | Freq: Four times a day (QID) | OROMUCOSAL | Status: DC
  Administered 2014-06-17: 7 mL via OROMUCOSAL

## 2014-06-17 MED ORDER — MIDAZOLAM HCL 5 MG/5ML IJ SOLN
INTRAMUSCULAR | Status: DC | PRN
  Administered 2014-06-17: 3 mg via INTRAVENOUS
  Administered 2014-06-17 (×4): 2 mg via INTRAVENOUS
  Administered 2014-06-17: 3 mg via INTRAVENOUS

## 2014-06-17 MED ORDER — MORPHINE SULFATE 2 MG/ML IJ SOLN
1.0000 mg | INTRAMUSCULAR | Status: AC | PRN
  Administered 2014-06-17: 4 mg via INTRAVENOUS
  Administered 2014-06-17: 2 mg via INTRAVENOUS
  Filled 2014-06-17: qty 2

## 2014-06-17 MED FILL — Sodium Bicarbonate IV Soln 8.4%: INTRAVENOUS | Qty: 50 | Status: AC

## 2014-06-17 MED FILL — Mannitol IV Soln 20%: INTRAVENOUS | Qty: 500 | Status: AC

## 2014-06-17 MED FILL — Lidocaine HCl IV Inj 20 MG/ML: INTRAVENOUS | Qty: 5 | Status: AC

## 2014-06-17 MED FILL — Heparin Sodium (Porcine) Inj 1000 Unit/ML: INTRAMUSCULAR | Qty: 10 | Status: AC

## 2014-06-17 MED FILL — Heparin Sodium (Porcine) Inj 1000 Unit/ML: INTRAMUSCULAR | Qty: 30 | Status: AC

## 2014-06-17 MED FILL — Electrolyte-R (PH 7.4) Solution: INTRAVENOUS | Qty: 4000 | Status: AC

## 2014-06-17 MED FILL — Sodium Chloride IV Soln 0.9%: INTRAVENOUS | Qty: 2000 | Status: AC

## 2014-06-17 SURGICAL SUPPLY — 101 items
ATTRACTOMAT 16X20 MAGNETIC DRP (DRAPES) ×3 IMPLANT
BAG DECANTER FOR FLEXI CONT (MISCELLANEOUS) ×3 IMPLANT
BANDAGE ELASTIC 4 VELCRO ST LF (GAUZE/BANDAGES/DRESSINGS) ×3 IMPLANT
BANDAGE ELASTIC 6 VELCRO ST LF (GAUZE/BANDAGES/DRESSINGS) ×3 IMPLANT
BASKET HEART (ORDER IN 25'S) (MISCELLANEOUS) ×1
BASKET HEART (ORDER IN 25S) (MISCELLANEOUS) ×2 IMPLANT
BLADE STERNUM SYSTEM 6 (BLADE) ×3 IMPLANT
BNDG GAUZE ELAST 4 BULKY (GAUZE/BANDAGES/DRESSINGS) ×3 IMPLANT
CANISTER SUCTION 2500CC (MISCELLANEOUS) ×3 IMPLANT
CARDIAC SUCTION (MISCELLANEOUS) ×3 IMPLANT
CATH ROBINSON RED A/P 18FR (CATHETERS) ×6 IMPLANT
CATH THORACIC 28FR (CATHETERS) ×3 IMPLANT
CATH THORACIC 36FR (CATHETERS) ×3 IMPLANT
CATH THORACIC 36FR RT ANG (CATHETERS) ×3 IMPLANT
CLIP TI MEDIUM 24 (CLIP) IMPLANT
CLIP TI WIDE RED SMALL 24 (CLIP) IMPLANT
CONN ST 1/4X3/8  BEN (MISCELLANEOUS) ×2
CONN ST 1/4X3/8 BEN (MISCELLANEOUS) IMPLANT
COVER SURGICAL LIGHT HANDLE (MISCELLANEOUS) ×3 IMPLANT
CRADLE DONUT ADULT HEAD (MISCELLANEOUS) ×3 IMPLANT
DRAPE CARDIOVASCULAR INCISE (DRAPES) ×3
DRAPE SLUSH/WARMER DISC (DRAPES) ×3 IMPLANT
DRAPE SRG 135X102X78XABS (DRAPES) ×2 IMPLANT
DRSG COVADERM 4X14 (GAUZE/BANDAGES/DRESSINGS) ×3 IMPLANT
ELECT CAUTERY BLADE 6.4 (BLADE) ×3 IMPLANT
ELECT REM PT RETURN 9FT ADLT (ELECTROSURGICAL) ×6
ELECTRODE REM PT RTRN 9FT ADLT (ELECTROSURGICAL) ×4 IMPLANT
GAUZE SPONGE 4X4 12PLY STRL (GAUZE/BANDAGES/DRESSINGS) ×6 IMPLANT
GLOVE BIO SURGEON STRL SZ 6 (GLOVE) ×2 IMPLANT
GLOVE BIO SURGEON STRL SZ 6.5 (GLOVE) ×4 IMPLANT
GLOVE BIO SURGEON STRL SZ7 (GLOVE) ×4 IMPLANT
GLOVE BIO SURGEON STRL SZ7.5 (GLOVE) ×2 IMPLANT
GLOVE BIOGEL PI IND STRL 6 (GLOVE) IMPLANT
GLOVE BIOGEL PI IND STRL 6.5 (GLOVE) IMPLANT
GLOVE BIOGEL PI IND STRL 7.0 (GLOVE) IMPLANT
GLOVE BIOGEL PI INDICATOR 6 (GLOVE) ×2
GLOVE BIOGEL PI INDICATOR 6.5 (GLOVE) ×6
GLOVE BIOGEL PI INDICATOR 7.0 (GLOVE) ×2
GLOVE EUDERMIC 7 POWDERFREE (GLOVE) ×6 IMPLANT
GLOVE ORTHO TXT STRL SZ7.5 (GLOVE) IMPLANT
GOWN STRL REUS W/ TWL LRG LVL3 (GOWN DISPOSABLE) ×8 IMPLANT
GOWN STRL REUS W/ TWL XL LVL3 (GOWN DISPOSABLE) ×2 IMPLANT
GOWN STRL REUS W/TWL LRG LVL3 (GOWN DISPOSABLE) ×30
GOWN STRL REUS W/TWL XL LVL3 (GOWN DISPOSABLE) ×3
HEMOSTAT POWDER SURGIFOAM 1G (HEMOSTASIS) ×9 IMPLANT
HEMOSTAT SURGICEL 2X14 (HEMOSTASIS) ×3 IMPLANT
INSERT FOGARTY 61MM (MISCELLANEOUS) IMPLANT
INSERT FOGARTY XLG (MISCELLANEOUS) IMPLANT
KIT BASIN OR (CUSTOM PROCEDURE TRAY) ×3 IMPLANT
KIT CATH CPB BARTLE (MISCELLANEOUS) ×3 IMPLANT
KIT ROOM TURNOVER OR (KITS) ×3 IMPLANT
KIT SUCTION CATH 14FR (SUCTIONS) ×3 IMPLANT
KIT VASOVIEW W/TROCAR VH 2000 (KITS) ×3 IMPLANT
NS IRRIG 1000ML POUR BTL (IV SOLUTION) ×15 IMPLANT
PACK OPEN HEART (CUSTOM PROCEDURE TRAY) ×3 IMPLANT
PAD ARMBOARD 7.5X6 YLW CONV (MISCELLANEOUS) ×6 IMPLANT
PAD ELECT DEFIB RADIOL ZOLL (MISCELLANEOUS) ×3 IMPLANT
PENCIL BUTTON HOLSTER BLD 10FT (ELECTRODE) ×3 IMPLANT
PUNCH AORTIC ROTATE 4.0MM (MISCELLANEOUS) IMPLANT
PUNCH AORTIC ROTATE 4.5MM 8IN (MISCELLANEOUS) ×3 IMPLANT
PUNCH AORTIC ROTATE 5MM 8IN (MISCELLANEOUS) IMPLANT
SET CARDIOPLEGIA MPS 5001102 (MISCELLANEOUS) ×1 IMPLANT
SPONGE GAUZE 4X4 12PLY STER LF (GAUZE/BANDAGES/DRESSINGS) ×2 IMPLANT
SPONGE INTESTINAL PEANUT (DISPOSABLE) IMPLANT
SPONGE LAP 18X18 X RAY DECT (DISPOSABLE) IMPLANT
SPONGE LAP 4X18 X RAY DECT (DISPOSABLE) ×3 IMPLANT
SUT BONE WAX W31G (SUTURE) ×3 IMPLANT
SUT MNCRL AB 4-0 PS2 18 (SUTURE) ×1 IMPLANT
SUT PROLENE 3 0 SH DA (SUTURE) IMPLANT
SUT PROLENE 3 0 SH1 36 (SUTURE) ×3 IMPLANT
SUT PROLENE 4 0 RB 1 (SUTURE)
SUT PROLENE 4 0 SH DA (SUTURE) IMPLANT
SUT PROLENE 4-0 RB1 .5 CRCL 36 (SUTURE) IMPLANT
SUT PROLENE 5 0 C 1 36 (SUTURE) ×1 IMPLANT
SUT PROLENE 6 0 C 1 30 (SUTURE) IMPLANT
SUT PROLENE 7 0 BV 1 (SUTURE) ×1 IMPLANT
SUT PROLENE 7 0 BV1 MDA (SUTURE) ×3 IMPLANT
SUT PROLENE 8 0 BV175 6 (SUTURE) IMPLANT
SUT SILK  1 MH (SUTURE)
SUT SILK 1 MH (SUTURE) IMPLANT
SUT SILK 2 0 SH CR/8 (SUTURE) ×1 IMPLANT
SUT STEEL STERNAL CCS#1 18IN (SUTURE) IMPLANT
SUT STEEL SZ 6 DBL 3X14 BALL (SUTURE) ×3 IMPLANT
SUT VIC AB 1 CTX 36 (SUTURE) ×6
SUT VIC AB 1 CTX36XBRD ANBCTR (SUTURE) ×4 IMPLANT
SUT VIC AB 2-0 CT1 27 (SUTURE) ×3
SUT VIC AB 2-0 CT1 TAPERPNT 27 (SUTURE) IMPLANT
SUT VIC AB 2-0 CTX 27 (SUTURE) IMPLANT
SUT VIC AB 3-0 SH 27 (SUTURE)
SUT VIC AB 3-0 SH 27X BRD (SUTURE) IMPLANT
SUT VIC AB 3-0 X1 27 (SUTURE) ×1 IMPLANT
SUT VICRYL 4-0 PS2 18IN ABS (SUTURE) IMPLANT
SUTURE E-PAK OPEN HEART (SUTURE) ×3 IMPLANT
SYSTEM SAHARA CHEST DRAIN ATS (WOUND CARE) ×3 IMPLANT
TAPE CLOTH SURG 4X10 WHT LF (GAUZE/BANDAGES/DRESSINGS) ×1 IMPLANT
TOWEL OR 17X24 6PK STRL BLUE (TOWEL DISPOSABLE) ×3 IMPLANT
TOWEL OR 17X26 10 PK STRL BLUE (TOWEL DISPOSABLE) ×3 IMPLANT
TRAY FOLEY IC TEMP SENS 16FR (CATHETERS) ×3 IMPLANT
TUBING INSUFFLATION (TUBING) ×3 IMPLANT
UNDERPAD 30X30 INCONTINENT (UNDERPADS AND DIAPERS) ×3 IMPLANT
WATER STERILE IRR 1000ML POUR (IV SOLUTION) ×6 IMPLANT

## 2014-06-17 NOTE — Anesthesia Postprocedure Evaluation (Signed)
  Anesthesia Post-op Note  Patient: Eric Saunders  Procedure(s) Performed: Procedure(s): CORONARY ARTERY BYPASS GRAFTING (CABG) X 4, RIGHT LEG EVH (N/A) TRANSESOPHAGEAL ECHOCARDIOGRAM (TEE) (N/A)  Patient Location: SICU  Anesthesia Type:General  Level of Consciousness: sedated and Patient remains intubated per anesthesia plan  Airway and Oxygen Therapy: Patient remains intubated per anesthesia plan and Patient placed on Ventilator (see vital sign flow sheet for setting)  Post-op Pain: none  Post-op Assessment: Post-op Vital signs reviewed  Post-op Vital Signs: Reviewed  Last Vitals:  Filed Vitals:   06/17/14 1445  BP:   Pulse: 80  Temp: 36.5 C  Resp: 16    Complications: No apparent anesthesia complications

## 2014-06-17 NOTE — Procedures (Signed)
Extubation Procedure Note  Patient Details:   Name: Eric Saunders DOB: 1956-08-03 MRN: [id number]   Airway Documentation:     Evaluation  O2 sats: stable throughout Complications: No apparent complications Patient did tolerate procedure well. Bilateral Breath Sounds: Clear Suctioning: Oral;Airway Yes     Mariam Dollar 06/17/2014, 7:35 PM

## 2014-06-17 NOTE — Brief Op Note (Signed)
      [address]       [place] [address]             [phone]     06/16/2014 - 06/17/2014  10:50 AM  PATIENT:  Eric Saunders  58 y.o. male  PRE-OPERATIVE DIAGNOSIS:  CAD  POST-OPERATIVE DIAGNOSIS:  CAD  PROCEDURE:  Procedure(s): CORONARY ARTERY BYPASS GRAFTING (CABG)X4 LIMA-LAD; SVG-OM; SVG-DIAG; SVG-RCA TRANSESOPHAGEAL ECHOCARDIOGRAM (TEE) EVH- RIGHT LEG  SURGEON:  Surgeon(s): Gaye Pollack, MD  PHYSICIAN ASSISTANT: WAYNE GOLD PA-C  ANESTHESIA:   general  PATIENT CONDITION:  ICU - intubated and hemodynamically stable.  PRE-OPERATIVE WEIGHT: 87kg  EBL: SEE ANEST / PERFUSION RECOORDS  COMPLICATIONS: NO KNOWN

## 2014-06-17 NOTE — Progress Notes (Signed)
Patient ID: Eric Saunders, male   DOB: Nov 10, 1955, 58 y.o.   MRN: [id number] EVENING ROUNDS NOTE :     [address]       [place] [address]             [phone]                 Day of Surgery Procedure(s) (LRB): CORONARY ARTERY BYPASS GRAFTING (CABG) X 4, RIGHT LEG EVH (N/A) TRANSESOPHAGEAL ECHOCARDIOGRAM (TEE) (N/A)  Total Length of Stay:  LOS: 1 day  BP 90/62  Pulse 77  Temp(Src) 100.9 F (38.3 C) (Core (Comment))  Resp 16  Ht 5\' 8"  (1.727 m)  Wt 193 lb 8 oz (87.771 kg)  BMI 29.43 kg/m2  SpO2 91%  .Intake/Output     10/01 0701 - 10/02 0700 10/02 0701 - 10/03 0700   I.V. (mL/kg) 500 (5.7) 3298.4 (37.6)   Blood  525   IV Piggyback  850   Total Intake(mL/kg) 500 (5.7) 4673.4 (53.2)   Urine (mL/kg/hr) 400 (0.2) 1520 (1.4)   Blood  1415 (1.3)   Chest Tube  300 (0.3)   Total Output 400 3235   Net +100 +1438.4          . sodium chloride 20 mL/hr (06/17/14 1235)  . sodium chloride 20 mL/hr (06/17/14 1235)  . [START ON 06/18/2014] sodium chloride    . dexmedetomidine Stopped (06/17/14 1600)  . insulin (NOVOLIN-R) infusion Stopped (06/17/14 1300)  . lactated ringers 10 mL/hr at 06/17/14 1600  . nitroGLYCERIN Stopped (06/17/14 1245)  . phenylephrine (NEO-SYNEPHRINE) Adult infusion Stopped (06/17/14 1245)     Lab Results  Component Value Date   WBC 11.4* 06/17/2014   HGB 9.5* 06/17/2014   HCT 28.0* 06/17/2014   PLT 187 06/17/2014   GLUCOSE 109* 06/17/2014   CHOL 168 01/17/2014   TRIG 135 01/17/2014   HDL 43 01/17/2014   LDLCALC 98 01/17/2014   ALT 25 06/10/2014   AST 14 06/10/2014   NA 138 06/17/2014   K 3.6* 06/17/2014   CL 97 06/17/2014   CREATININE 0.90 06/17/2014   BUN 17 06/17/2014   CO2 26 06/17/2014   PSA 1.4 08/09/2013   INR 1.39 06/17/2014   HGBA1C 6.0* 06/16/2014   Early post op now, extubated not bleeding  Grace Isaac MD  Beeper [phone] Office [phone] 06/17/2014 6:59 PM

## 2014-06-17 NOTE — Progress Notes (Signed)
Utilization Review Completed.Eric Saunders T10/10/2013  

## 2014-06-17 NOTE — Progress Notes (Addendum)
Echo Lab  Transesophageal Echocardiogram completed.  Mayo, RDCS 06/17/2014 8:02 AM

## 2014-06-17 NOTE — Anesthesia Preprocedure Evaluation (Signed)
Anesthesia Evaluation  Patient identified by MRN, date of birth, ID band Patient awake    Reviewed: Allergy & Precautions, H&P , NPO status , Patient's Chart, lab work & pertinent test results  Airway Mallampati: III TM Distance: >3 FB Neck ROM: Full    Dental  (+) Teeth Intact, Dental Advisory Given   Pulmonary Current Smoker,  breath sounds clear to auscultation        Cardiovascular hypertension, Pt. on medications + angina with exertion + CAD Rhythm:Regular Rate:Normal     Neuro/Psych    GI/Hepatic   Endo/Other    Renal/GU      Musculoskeletal   Abdominal   Peds  Hematology   Anesthesia Other Findings   Reproductive/Obstetrics                           Anesthesia Physical Anesthesia Plan  ASA: IV  Anesthesia Plan: General   Post-op Pain Management:    Induction: Intravenous  Airway Management Planned: Oral ETT and Video Laryngoscope Planned  Additional Equipment: Arterial line, Ultrasound Guidance Line Placement, PA Cath and TEE  Intra-op Plan:   Post-operative Plan: Extubation in OR  Informed Consent: I have reviewed the patients History and Physical, chart, labs and discussed the procedure including the risks, benefits and alternatives for the proposed anesthesia with the patient or authorized representative who has indicated his/her understanding and acceptance.   Dental advisory given  Plan Discussed with: CRNA, Anesthesiologist and Surgeon  Anesthesia Plan Comments:         Anesthesia Quick Evaluation

## 2014-06-17 NOTE — Transfer of Care (Signed)
Immediate Anesthesia Transfer of Care Note  Patient: Eric Saunders  Procedure(s) Performed: Procedure(s): CORONARY ARTERY BYPASS GRAFTING (CABG) X 4, RIGHT LEG EVH (N/A) TRANSESOPHAGEAL ECHOCARDIOGRAM (TEE) (N/A)  Patient Location: PACU  Anesthesia Type:General  Level of Consciousness: sedated and Patient remains intubated per anesthesia plan  Airway & Oxygen Therapy: Patient remains intubated per anesthesia plan and Patient placed on Ventilator (see vital sign flow sheet for setting)  Post-op Assessment: Report given to PACU RN and Post -op Vital signs reviewed and stable  Post vital signs: Reviewed and stable  Complications: No apparent anesthesia complications

## 2014-06-17 NOTE — Anesthesia Procedure Notes (Signed)
Procedures   The patient was identified and consent obtained.  TO was performed, and full barrier precautions were used.  The skin was anesthetized with lidocaine.  Once the vein was located with the 22 ga. needle using ultrasound guidance , the wire was inserted into the vein.  The wire location was confirmed with ultrasound.  The insertion site was dilated and the introducer was carefully inserted and sutured in place. The PAC was checked, and floated into the PA.  Once in the PA, the catheter was secured. The patient tolerated the procedure well.  CXR was ordered for PACU. Start: 3614 End: [place] J. Tedra Senegal, MD

## 2014-06-17 NOTE — Op Note (Signed)
CARDIOVASCULAR SURGERY OPERATIVE NOTE  06/17/2014  Surgeon:  Gaye Pollack, MD  First Assistant: Jadene Pierini, PA-C   Preoperative Diagnosis:  Severe multi-vessel coronary artery disease   Postoperative Diagnosis:  Same   Procedure:  1. Median Sternotomy 2. Extracorporeal circulation 3.   Coronary artery bypass grafting x 4   Left internal mammary graft to the LAD  SVG to diagonal  SVG to OM2  SVG to PDA 4.   Endoscopic vein harvest from the right leg   Anesthesia:  General Endotracheal   Clinical History/Surgical Indication:  The patient is a 30 pk-year active smoker with hypertension and hyperlipidemia who says he feels great but was having some chest pain at work while lifting heavy items. The pain did not last long and there were no associated symptoms. He had a treadmill stress test showing ST depression peak exercise. Cath was recommended but he wanted to think about it further and was cathed today. This shows severe 3-vessel disease. I agree that CABG is the best treatment for this gentleman with severe 3-vessel CAD. I discussed the operative procedure with the patient and family including alternatives, benefits and risks; including but not limited to bleeding, blood transfusion, infection, stroke, myocardial infarction, graft failure, heart block requiring a permanent pacemaker, organ dysfunction, and death. [name] understands and agrees to proceed.     Preparation:  The patient was seen in the preoperative holding area and the correct patient, correct operation were confirmed with the patient after reviewing the medical record and catheterization. The consent was signed by me. Preoperative antibiotics were given. A pulmonary arterial line and radial arterial line were placed by the anesthesia team. The patient was taken back to the operating room and positioned  supine on the operating room table. After being placed under general endotracheal anesthesia by the anesthesia team a foley catheter was placed. The neck, chest, abdomen, and both legs were prepped with betadine soap and solution and draped in the usual sterile manner. A surgical time-out was taken and the correct patient and operative procedure were confirmed with the nursing and anesthesia staff.   Cardiopulmonary Bypass:  A median sternotomy was performed. The pericardium was opened in the midline. Right ventricular function appeared normal. The ascending aorta was of normal size and had no palpable plaque. There were no contraindications to aortic cannulation or cross-clamping. The patient was fully systemically heparinized and the ACT was maintained > 400 sec. The proximal aortic arch was cannulated with a 20 F aortic cannula for arterial inflow. Venous cannulation was performed via the right atrial appendage using a two-staged venous cannula. An antegrade cardioplegia/vent cannula was inserted into the mid-ascending aorta. Aortic occlusion was performed with a single cross-clamp. Systemic cooling to 32 degrees Centigrade and topical cooling of the heart with iced saline were used. Hyperkalemic antegrade cold blood cardioplegia was used to induce diastolic arrest and was then given at about 20 minute intervals throughout the period of arrest to maintain myocardial temperature at or below 10 degrees centigrade. A temperature probe was inserted into the interventricular septum and an insulating pad was placed in the pericardium.   Left internal mammary harvest:  The left side of the sternum was retracted using the Rultract retractor. The left internal mammary artery was harvested as a pedicle graft. All side branches were clipped. It was a medium-sized vessel of good quality with excellent blood flow. His sternum was short and therefore the LIMA was shorter than usual but long enough to  reach the mid  LAD. It was ligated distally and divided. It was sprayed with topical papaverine solution to prevent vasospasm.   Endoscopic vein harvest:  The right greater saphenous vein was harvested endoscopically through a 2 cm incision medial to the right knee. It was harvested from the upper thigh to below the knee. It was a medium-sized vein of good quality. The side branches were all ligated with 4-0 silk ties.    Coronary arteries:  The coronary arteries were examined.   LAD:  Large vessel with diffuse mid vessel disease  LCX:  Large OM with moderate proximal and mid vessel disease  RCA:  Diffusely diseased. PDA is moderate sized with no significant disease   Grafts:  1. LIMA to the LAD: 2.0 mm. It was sewn end to side using 8-0 prolene continuous suture. 2. SVG to OM:  1.75 mm. It was sewn end to side using 7-0 prolene continuous suture. 3. SVG to Diagonal:  1.6 mm. It was sewn end to side using 7-0 prolene continuous suture. 4. SVG to PDA :  1.6 mm. It was sewn end to side using 7-0 prolene continuous suture.  The proximal vein graft anastomoses were performed to the mid-ascending aorta using continuous 6-0 prolene suture. Graft markers were placed around the proximal anastomoses.   Completion:  The patient was rewarmed to 37 degrees Centigrade. The clamp was removed from the LIMA pedicle and there was rapid warming of the septum and return of ventricular fibrillation. The crossclamp was removed. There was spontaneous return of sinus rhythm. The distal and proximal anastomoses were checked for hemostasis. The position of the grafts was satisfactory. Two temporary epicardial pacing wires were placed on the right atrium and two on the right ventricle. The patient was weaned from CPB without difficulty on no inotropes. Cardiac output was 6 LPM. Heparin was fully reversed with protamine and the aortic and venous cannulas removed. Hemostasis was achieved. Mediastinal and left pleural drainage  tubes were placed. The sternum was closed with double #6 stainless steel wires. The fascia was closed with continuous # 1 vicryl suture. The subcutaneous tissue was closed with 2-0 vicryl continuous suture. The skin was closed with 3-0 vicryl subcuticular suture. All sponge, needle, and instrument counts were reported correct at the end of the case. Dry sterile dressings were placed over the incisions and around the chest tubes which were connected to pleurevac suction. The patient was then transported to the surgical intensive care unit in critical but stable condition.

## 2014-06-18 ENCOUNTER — Inpatient Hospital Stay (HOSPITAL_COMMUNITY): Payer: BC Managed Care – PPO

## 2014-06-18 DIAGNOSIS — Z01810 Encounter for preprocedural cardiovascular examination: Secondary | ICD-10-CM

## 2014-06-18 LAB — GLUCOSE, CAPILLARY
GLUCOSE-CAPILLARY: 112 mg/dL — AB (ref 70–99)
GLUCOSE-CAPILLARY: 127 mg/dL — AB (ref 70–99)
GLUCOSE-CAPILLARY: 109 mg/dL — AB (ref 70–99)
Glucose-Capillary: 98 mg/dL (ref 70–99)
Glucose-Capillary: 129 mg/dL — ABNORMAL HIGH (ref 70–99)
Glucose-Capillary: 138 mg/dL — ABNORMAL HIGH (ref 70–99)

## 2014-06-18 LAB — POCT I-STAT, CHEM 8
BUN: 15 mg/dL (ref 6–23)
CHLORIDE: 98 meq/L (ref 96–112)
Calcium, Ion: 1.13 mmol/L (ref 1.12–1.23)
Creatinine, Ser: 1.2 mg/dL (ref 0.50–1.35)
GLUCOSE: 142 mg/dL — AB (ref 70–99)
HCT: 35 % — ABNORMAL LOW (ref 39.0–52.0)
Hemoglobin: 11.9 g/dL — ABNORMAL LOW (ref 13.0–17.0)
POTASSIUM: 3.7 meq/L (ref 3.7–5.3)
Sodium: 136 mEq/L — ABNORMAL LOW (ref 137–147)
TCO2: 23 mmol/L (ref 0–100)

## 2014-06-18 LAB — CBC
HCT: 34.1 % — ABNORMAL LOW (ref 39.0–52.0)
HEMATOCRIT: 33 % — AB (ref 39.0–52.0)
Hemoglobin: 11 g/dL — ABNORMAL LOW (ref 13.0–17.0)
Hemoglobin: 11.3 g/dL — ABNORMAL LOW (ref 13.0–17.0)
MCH: 29.1 pg (ref 26.0–34.0)
MCH: 28.9 pg (ref 26.0–34.0)
MCHC: 33.3 g/dL (ref 30.0–36.0)
MCHC: 33.1 g/dL (ref 30.0–36.0)
MCV: 87.3 fL (ref 78.0–100.0)
MCV: 87.2 fL (ref 78.0–100.0)
PLATELETS: 297 10*3/uL (ref 150–400)
PLATELETS: 282 10*3/uL (ref 150–400)
RBC: 3.78 MIL/uL — ABNORMAL LOW (ref 4.22–5.81)
RBC: 3.91 MIL/uL — ABNORMAL LOW (ref 4.22–5.81)
RDW: 13.5 % (ref 11.5–15.5)
RDW: 13.5 % (ref 11.5–15.5)
WBC: 14.3 10*3/uL — AB (ref 4.0–10.5)
WBC: 16 10*3/uL — AB (ref 4.0–10.5)

## 2014-06-18 LAB — BASIC METABOLIC PANEL
ANION GAP: 12 (ref 5–15)
BUN: 14 mg/dL (ref 6–23)
CALCIUM: 7.9 mg/dL — AB (ref 8.4–10.5)
CHLORIDE: 104 meq/L (ref 96–112)
CO2: 25 mEq/L (ref 19–32)
CREATININE: 1.07 mg/dL (ref 0.50–1.35)
GFR calc Af Amer: 88 mL/min — ABNORMAL LOW (ref >90)
GFR calc non Af Amer: 76 mL/min — ABNORMAL LOW (ref >90)
Glucose, Bld: 133 mg/dL — ABNORMAL HIGH (ref 70–99)
Potassium: 3.8 mEq/L (ref 3.7–5.3)
Sodium: 141 mEq/L (ref 137–147)

## 2014-06-18 LAB — CREATININE, SERUM
CREATININE: 1.11 mg/dL (ref 0.50–1.35)
GFR calc Af Amer: 84 mL/min — ABNORMAL LOW (ref >90)
GFR calc non Af Amer: 72 mL/min — ABNORMAL LOW (ref >90)

## 2014-06-18 LAB — MAGNESIUM
Magnesium: 2.4 mg/dL (ref 1.5–2.5)
Magnesium: 2.1 mg/dL (ref 1.5–2.5)

## 2014-06-18 MED ORDER — SIMVASTATIN 40 MG PO TABS
40.0000 mg | ORAL_TABLET | Freq: Every day | ORAL | Status: DC
  Administered 2014-06-18 – 2014-06-22 (×5): 40 mg via ORAL
  Filled 2014-06-18 (×6): qty 1

## 2014-06-18 MED ORDER — ENOXAPARIN SODIUM 30 MG/0.3ML ~~LOC~~ SOLN
30.0000 mg | SUBCUTANEOUS | Status: DC
  Administered 2014-06-18: 30 mg via SUBCUTANEOUS
  Filled 2014-06-18 (×2): qty 0.3

## 2014-06-18 MED ORDER — FENOFIBRATE 160 MG PO TABS
160.0000 mg | ORAL_TABLET | Freq: Every day | ORAL | Status: DC
  Administered 2014-06-18 – 2014-06-23 (×6): 160 mg via ORAL
  Filled 2014-06-18 (×6): qty 1

## 2014-06-18 MED ORDER — ALPRAZOLAM 0.5 MG PO TABS
0.5000 mg | ORAL_TABLET | Freq: Two times a day (BID) | ORAL | Status: DC | PRN
  Administered 2014-06-18 – 2014-06-23 (×10): 0.5 mg via ORAL
  Filled 2014-06-18 (×11): qty 1

## 2014-06-18 MED ORDER — ACETYLCYSTEINE 10 % IN SOLN: 2.0000 mL | Freq: Four times a day (QID) | RESPIRATORY_TRACT | Status: DC

## 2014-06-18 MED ORDER — POTASSIUM CHLORIDE 10 MEQ/50ML IV SOLN
10.0000 meq | INTRAVENOUS | Status: AC
  Administered 2014-06-18 (×3): 10 meq via INTRAVENOUS
  Filled 2014-06-18 (×2): qty 50

## 2014-06-18 MED ORDER — ACETYLCYSTEINE 10 % IN SOLN
2.0000 mL | Freq: Four times a day (QID) | RESPIRATORY_TRACT | Status: DC
  Filled 2014-06-18 (×3): qty 4

## 2014-06-18 MED ORDER — ACETYLCYSTEINE 20 % IN SOLN
4.0000 mL | Freq: Four times a day (QID) | RESPIRATORY_TRACT | Status: DC
  Administered 2014-06-18 – 2014-06-19 (×3): 4 mL via RESPIRATORY_TRACT
  Filled 2014-06-18 (×7): qty 4

## 2014-06-18 MED ORDER — FUROSEMIDE 10 MG/ML IJ SOLN
40.0000 mg | Freq: Once | INTRAMUSCULAR | Status: AC
  Administered 2014-06-18: 40 mg via INTRAVENOUS
  Filled 2014-06-18: qty 4

## 2014-06-18 MED ORDER — POTASSIUM CHLORIDE 10 MEQ/50ML IV SOLN
10.0000 meq | INTRAVENOUS | Status: AC
  Administered 2014-06-18 (×3): 10 meq via INTRAVENOUS

## 2014-06-18 MED ORDER — LEVALBUTEROL HCL 0.63 MG/3ML IN NEBU
0.6300 mg | INHALATION_SOLUTION | Freq: Four times a day (QID) | RESPIRATORY_TRACT | Status: DC | PRN
Start: 2014-06-18 — End: 2014-06-23
  Administered 2014-06-18 – 2014-06-21 (×9): 0.63 mg via RESPIRATORY_TRACT
  Filled 2014-06-18 (×10): qty 3

## 2014-06-18 MED ORDER — INSULIN ASPART 100 UNIT/ML ~~LOC~~ SOLN
0.0000 [IU] | SUBCUTANEOUS | Status: DC
  Administered 2014-06-18 (×2): 2 [IU] via SUBCUTANEOUS

## 2014-06-18 NOTE — Progress Notes (Signed)
Patient ID: Eric Saunders, male   DOB: Jun 21, 1956, 58 y.o.   MRN: [id number] TCTS DAILY ICU PROGRESS NOTE                   [address]            [place] [address]          [phone]   1 Day Post-Op Procedure(s) (LRB): CORONARY ARTERY BYPASS GRAFTING (CABG) X 4, RIGHT LEG EVH (N/A) TRANSESOPHAGEAL ECHOCARDIOGRAM (TEE) (N/A)  Total Length of Stay:  LOS: 2 days   Subjective: Wheezing and sob, wants xanax was on at home, heavy smoker   Objective: Vital signs in last 24 hours: Temp:  [95.9 F (35.5 C)-101.3 F (38.5 C)] 99.9 F (37.7 C) (10/03 1000) Pulse Rate:  [64-87] 80 (10/03 1000) Cardiac Rhythm:  [-] Atrial paced (10/03 0800) Resp:  [0-29] 0 (10/03 1000) BP: (85-130)/(48-76) 104/58 mmHg (10/03 0900) SpO2:  [91 %-100 %] 95 % (10/03 1000) Arterial Line BP: (83-155)/(20-68) 123/62 mmHg (10/03 1000) FiO2 (%):  [40 %-50 %] 40 % (10/02 1635) Weight:  [206 lb 12.7 oz (93.8 kg)] 206 lb 12.7 oz (93.8 kg) (10/03 0500)  Filed Weights   06/16/14 0700 06/16/14 2047 06/18/14 0500  Weight: 200 lb (90.719 kg) 193 lb 8 oz (87.771 kg) 206 lb 12.7 oz (93.8 kg)    Weight change: 13 lb 4.7 oz (6.029 kg)   Hemodynamic parameters for last 24 hours: PAP: (20-55)/(5-27) 41/16 mmHg CO:  [4.7 L/min-7 L/min] 5.2 L/min CI:  [2.4 L/min/m2-3.5 L/min/m2] 2.6 L/min/m2  Intake/Output from previous day: 10/02 0701 - 10/03 0700 In: 6097.4 [I.V.:4222.4; Blood:525; IV [id number]:0737] Out: 1062 [Urine:2375; Blood:1415; Chest Tube:770]  Intake/Output this shift: Total I/O In: 291.8 [I.V.:291.8] Out: 205 [Urine:135; Chest Tube:70]  Current Meds: Scheduled Meds: . acetaminophen  1,000 mg Oral 4 times per day   Or  . acetaminophen (TYLENOL) oral liquid 160 mg/5 mL  1,000 mg Per Tube 4 times per day  . antiseptic oral rinse  7 mL Mouth Rinse BID  . aspirin EC  325 mg Oral Daily   Or  . aspirin  324 mg Per Tube Daily  . bisacodyl  10 mg Oral Daily   Or  . bisacodyl  10 mg  Rectal Daily  . cefUROXime (ZINACEF)  IV  1.5 g Intravenous Q12H  . docusate sodium  200 mg Oral Daily  . famotidine (PEPCID) IV  20 mg Intravenous Q12H  . insulin aspart  0-24 Units Subcutaneous 6 times per day  . insulin regular  0-10 Units Intravenous TID WC  . metoprolol tartrate  12.5 mg Oral BID   Or  . metoprolol tartrate  12.5 mg Per Tube BID  . [START ON 06/19/2014] pantoprazole  40 mg Oral Daily  . simvastatin  40 mg Oral q1800  . sodium chloride  3 mL Intravenous Q12H   Continuous Infusions: . sodium chloride 20 mL/hr at 06/18/14 1000  . sodium chloride 20 mL/hr at 06/18/14 1000  . sodium chloride    . dexmedetomidine Stopped (06/17/14 1600)  . lactated ringers 10 mL/hr at 06/18/14 1000  . nitroGLYCERIN Stopped (06/17/14 1245)  . phenylephrine (NEO-SYNEPHRINE) Adult infusion 25 mcg/min (06/18/14 1010)   PRN Meds:.albumin human, metoprolol, midazolam, morphine injection, ondansetron (ZOFRAN) IV, oxyCODONE, sodium chloride, traMADol  General appearance: alert, cooperative and mild distress Neurologic: intact Heart: regular rate and rhythm, S1, S2 normal, no murmur, click, rub or gallop Lungs: wheezes bilaterally Abdomen:  soft, non-tender; bowel sounds normal; no masses,  no organomegaly Extremities: extremities normal, atraumatic, no cyanosis or edema and Homans sign is negative, no sign of DVT Wound: sternum intact  Lab Results: CBC: Recent Labs  06/17/14 1830 06/17/14 1852 06/18/14 0336  WBC 14.5*  --  14.3*  HGB 11.6* 12.6* 11.0*  HCT 34.0* 37.0* 33.0*  PLT 254  --  297   BMET:  Recent Labs  06/17/14 0250  06/17/14 1852 06/18/14 0336  NA 142  < > 139 141  K 3.6*  < > 3.9 3.8  CL 101  < > 107 104  CO2 26  --   --  25  GLUCOSE 92  < > 125* 133*  BUN 18  < > 15 14  CREATININE 1.17  < > 1.10 1.07  CALCIUM 9.0  --   --  7.9*  < > = values in this interval not displayed.  PT/INR:  Recent Labs  06/17/14 1245  LABPROT 17.1*  INR 1.39   Radiology:  Dg Chest Portable 1 View In Am  06/18/2014   CLINICAL DATA:  Postop for CABG. Left-sided chest tube. Shortness of breath.  EXAM: PORTABLE CHEST - 1 VIEW  COMPARISON:  1 day prior  FINDINGS: Extubation. Removal of nasogastric tube. Mediastinal drain and bilateral chest tubes remain. Right IJ Swan-Ganz catheter unchanged with tip at proximal right pulmonary artery.  Cardiomegaly accentuated by AP portable technique. Diminished lung volumes. No pleural effusion or pneumothorax. No congestive failure.  IMPRESSION: Extubation with diminished lung volumes. Developing left base atelectasis.  Bilateral chest tubes in place, without pneumothorax.   Electronically Signed   By: Abigail Miyamoto M.D.   On: 06/18/2014 10:22   Dg Chest Portable 1 View  06/17/2014   CLINICAL DATA:  Status post cardiac surgery.  Chest tube.  EXAM: PORTABLE CHEST - 1 VIEW  COMPARISON:  06/10/2014.  FINDINGS: Endotracheal tube 3.6 cm above the carina. NG tube below hemidiaphragm. Bilateral chest tubes noted. No pneumothorax. No focal pulmonary infiltrate. Mild subsegmental atelectasis bilaterally. Cardiomegaly. Prior median sternotomy. No pulmonary venous congestion .  IMPRESSION: 1. Endotracheal tube, NG tube, bilateral chest tubes in good anatomic position. No pneumothorax. 2. Cardiomegaly.  No evidence of pulmonary venous congestion. 3. Bilateral subsegmental atelectasis.   Electronically Signed   By: Marcello Moores  Register   On: 06/17/2014 13:18     Assessment/Plan: S/P Procedure(s) (LRB): CORONARY ARTERY BYPASS GRAFTING (CABG) X 4, RIGHT LEG EVH (N/A) TRANSESOPHAGEAL ECHOCARDIOGRAM (TEE) (N/A) Mobilize Diuresis Diabetes control d/c tubes/lines Continue foley due to strict I&O See progression orders Work on pulmonary toilet     Ahsan Esterline B 06/18/2014 11:02 AM

## 2014-06-18 NOTE — Progress Notes (Signed)
Patient ID: Eric Saunders, male   DOB: 1955/11/09, 58 y.o.   MRN: [id number] EVENING ROUNDS NOTE :     [address]       [place] [address]             [phone]                 1 Day Post-Op Procedure(s) (LRB): CORONARY ARTERY BYPASS GRAFTING (CABG) X 4, RIGHT LEG EVH (N/A) TRANSESOPHAGEAL ECHOCARDIOGRAM (TEE) (N/A)  Total Length of Stay:  LOS: 2 days  BP 102/53  Pulse 91  Temp(Src) 98.5 F (36.9 C) (Oral)  Resp 31  Ht 5\' 8"  (1.727 m)  Wt 206 lb 12.7 oz (93.8 kg)  BMI 31.45 kg/m2  SpO2 96%  .Intake/Output     10/03 0701 - 10/04 0700   P.O. 720   I.V. (mL/kg) 681 (7.3)   Blood    IV Piggyback 250   Total Intake(mL/kg) 1651 (17.6)   Urine (mL/kg/hr) 795 (0.7)   Blood    Chest Tube 240 (0.2)   Total Output 1035   Net +616         . sodium chloride Stopped (06/18/14 1700)  . sodium chloride Stopped (06/18/14 1700)  . sodium chloride    . lactated ringers 10 mL/hr at 06/18/14 1900  . nitroGLYCERIN Stopped (06/17/14 1245)  . phenylephrine (NEO-SYNEPHRINE) Adult infusion Stopped (06/18/14 1050)     Lab Results  Component Value Date   WBC 16.0* 06/18/2014   HGB 11.9* 06/18/2014   HCT 35.0* 06/18/2014   PLT 282 06/18/2014   GLUCOSE 142* 06/18/2014   CHOL 168 01/17/2014   TRIG 135 01/17/2014   HDL 43 01/17/2014   LDLCALC 98 01/17/2014   ALT 25 06/10/2014   AST 14 06/10/2014   NA 136* 06/18/2014   K 3.7 06/18/2014   CL 98 06/18/2014   CREATININE 1.20 06/18/2014   BUN 15 06/18/2014   CO2 25 06/18/2014   PSA 1.4 08/09/2013   INR 1.39 06/17/2014   HGBA1C 6.0* 06/16/2014   resp status improved, better cough, wheezing cleared   Grace Isaac MD  Beeper [phone] Office [phone] 06/18/2014 7:43 PM

## 2014-06-19 ENCOUNTER — Inpatient Hospital Stay (HOSPITAL_COMMUNITY): Payer: BC Managed Care – PPO

## 2014-06-19 LAB — BASIC METABOLIC PANEL WITH GFR
Anion gap: 10 (ref 5–15)
BUN: 14 mg/dL (ref 6–23)
CO2: 27 meq/L (ref 19–32)
Calcium: 8.2 mg/dL — ABNORMAL LOW (ref 8.4–10.5)
Chloride: 99 meq/L (ref 96–112)
Creatinine, Ser: 1.01 mg/dL (ref 0.50–1.35)
GFR calc Af Amer: >90 mL/min
GFR calc non Af Amer: 81 mL/min — ABNORMAL LOW
Glucose, Bld: 111 mg/dL — ABNORMAL HIGH (ref 70–99)
Potassium: 4.5 meq/L (ref 3.7–5.3)
Sodium: 136 meq/L — ABNORMAL LOW (ref 137–147)

## 2014-06-19 LAB — CBC
HCT: 31.3 % — ABNORMAL LOW (ref 39.0–52.0)
Hemoglobin: 10.4 g/dL — ABNORMAL LOW (ref 13.0–17.0)
MCH: 29.1 pg (ref 26.0–34.0)
MCHC: 33.2 g/dL (ref 30.0–36.0)
MCV: 87.7 fL (ref 78.0–100.0)
Platelets: 234 K/uL (ref 150–400)
RBC: 3.57 MIL/uL — ABNORMAL LOW (ref 4.22–5.81)
RDW: 13.4 % (ref 11.5–15.5)
WBC: 13.8 K/uL — ABNORMAL HIGH (ref 4.0–10.5)

## 2014-06-19 LAB — GLUCOSE, CAPILLARY
GLUCOSE-CAPILLARY: 83 mg/dL (ref 70–99)
Glucose-Capillary: 108 mg/dL — ABNORMAL HIGH (ref 70–99)

## 2014-06-19 MED ORDER — ENOXAPARIN SODIUM 40 MG/0.4ML ~~LOC~~ SOLN
40.0000 mg | SUBCUTANEOUS | Status: DC
  Administered 2014-06-19 – 2014-06-22 (×4): 40 mg via SUBCUTANEOUS
  Filled 2014-06-19 (×5): qty 0.4

## 2014-06-19 MED ORDER — FUROSEMIDE 10 MG/ML IJ SOLN
40.0000 mg | Freq: Once | INTRAMUSCULAR | Status: AC
  Administered 2014-06-19: 40 mg via INTRAVENOUS
  Filled 2014-06-19: qty 4

## 2014-06-19 MED ORDER — ACETYLCYSTEINE 20 % IN SOLN
4.0000 mL | Freq: Two times a day (BID) | RESPIRATORY_TRACT | Status: DC
  Administered 2014-06-19 – 2014-06-21 (×5): 4 mL via RESPIRATORY_TRACT
  Filled 2014-06-19 (×11): qty 4

## 2014-06-19 NOTE — Progress Notes (Signed)
Patient ID: Eric Saunders, male   DOB: September 17, 1955, 58 y.o.   MRN: [id number] TCTS DAILY ICU PROGRESS NOTE                   [address]            [place] [address]          [phone]   2 Days Post-Op Procedure(s) (LRB): CORONARY ARTERY BYPASS GRAFTING (CABG) X 4, RIGHT LEG EVH (N/A) TRANSESOPHAGEAL ECHOCARDIOGRAM (TEE) (N/A)  Total Length of Stay:  LOS: 3 days   Subjective: Stable, resp effort improved   Objective: Vital signs in last 24 hours: Temp:  [97.9 F (36.6 C)-100.2 F (37.9 C)] 98.6 F (37 C) (10/04 0700) Pulse Rate:  [71-91] 73 (10/04 0900) Cardiac Rhythm:  [-] Atrial paced (10/04 0715) Resp:  [15-34] 27 (10/04 0900) BP: (86-133)/(52-105) 100/60 mmHg (10/04 0900) SpO2:  [92 %-99 %] 98 % (10/04 0938) Arterial Line BP: (105-130)/(48-65) 105/48 mmHg (10/03 1500) Weight:  [205 lb 4 oz (93.1 kg)] 205 lb 4 oz (93.1 kg) (10/04 0600)  Filed Weights   06/16/14 2047 06/18/14 0500 06/19/14 0600  Weight: 193 lb 8 oz (87.771 kg) 206 lb 12.7 oz (93.8 kg) 205 lb 4 oz (93.1 kg)    Weight change: -1 lb 8.7 oz (-0.7 kg)   Hemodynamic parameters for last 24 hours: PAP: (37)/(18) 37/18 mmHg  Intake/Output from previous day: 10/03 0701 - 10/04 0700 In: 6503 [P.O.:2040; I.V.:791; IV Piggyback:400] Out: 2780 [Urine:2345; Chest Tube:435]  Intake/Output this shift: Total I/O In: 250 [P.O.:240; I.V.:10] Out: 140 [Urine:125; Chest Tube:15]  Current Meds: Scheduled Meds: . acetaminophen  1,000 mg Oral 4 times per day   Or  . acetaminophen (TYLENOL) oral liquid 160 mg/5 mL  1,000 mg Per Tube 4 times per day  . acetylcysteine  4 mL Nebulization QID  . antiseptic oral rinse  7 mL Mouth Rinse BID  . aspirin EC  325 mg Oral Daily   Or  . aspirin  324 mg Per Tube Daily  . bisacodyl  10 mg Oral Daily   Or  . bisacodyl  10 mg Rectal Daily  . docusate sodium  200 mg Oral Daily  . enoxaparin (LOVENOX) injection  30 mg Subcutaneous Q24H  . fenofibrate  160  mg Oral Daily  . insulin aspart  0-24 Units Subcutaneous 6 times per day  . metoprolol tartrate  12.5 mg Oral BID   Or  . metoprolol tartrate  12.5 mg Per Tube BID  . pantoprazole  40 mg Oral Daily  . simvastatin  40 mg Oral q1800  . sodium chloride  3 mL Intravenous Q12H   Continuous Infusions: . sodium chloride Stopped (06/18/14 1700)  . sodium chloride Stopped (06/18/14 1700)  . sodium chloride    . lactated ringers 10 mL/hr at 06/18/14 1800  . nitroGLYCERIN Stopped (06/17/14 1245)  . phenylephrine (NEO-SYNEPHRINE) Adult infusion Stopped (06/18/14 1050)   PRN Meds:.ALPRAZolam, levalbuterol, metoprolol, morphine injection, ondansetron (ZOFRAN) IV, oxyCODONE, sodium chloride, traMADol  General appearance: alert and cooperative Neurologic: intact Heart: regular rate and rhythm, S1, S2 normal, no murmur, click, rub or gallop Lungs: diminished breath sounds bibasilar Abdomen: soft, non-tender; bowel sounds normal; no masses,  no organomegaly Extremities: extremities normal, atraumatic, no cyanosis or edema and Homans sign is negative, no sign of DVT Wound: sternum stable  Lab Results: CBC: Recent Labs  06/18/14 1602 06/18/14 1608 06/19/14 0333  WBC 16.0*  --  13.8*  HGB 11.3* 11.9* 10.4*  HCT 34.1* 35.0* 31.3*  PLT 282  --  234   BMET:  Recent Labs  06/18/14 0336  06/18/14 1608 06/19/14 0333  NA 141  --  136* 136*  K 3.8  --  3.7 4.5  CL 104  --  98 99  CO2 25  --   --  27  GLUCOSE 133*  --  142* 111*  BUN 14  --  15 14  CREATININE 1.07  < > 1.20 1.01  CALCIUM 7.9*  --   --  8.2*  < > = values in this interval not displayed.  PT/INR:  Recent Labs  06/17/14 1245  LABPROT 17.1*  INR 1.39   Radiology: Dg Chest Port 1 View  06/19/2014   CLINICAL DATA:  Post- operative exam following CABG semi: Bilateral chest tubes;  EXAM: PORTABLE CHEST - 1 VIEW  COMPARISON:  Portable chest x-rays of October 2 and 3, 2015  FINDINGS: The lungs are hypoinflated. There is a tiny  left apical pneumothorax which was not clearly evident on the previous studies. This amounts to less than 10% of the lung volume. Two chest tubes are in place. On the right the tip of the tube overlies the posterior aspect of the sixth rib. On the left the chest tube is slightly inferior to the posterior sixth rib. The cardiopericardial silhouette is mildly enlarged. There are 6 intact sternal wires present. No significant pleural effusion is demonstrated. There is mild bibasilar subsegmental atelectasis. The pulmonary vascularity is not clearly engorged. The right internal jugular Cordis sheath has its tip overlying the junction of the right internal jugular vein with the right subclavian vein.  IMPRESSION: There is a new tiny left apical pneumothorax. Otherwise, the appearance of the chest is stable. There is persistent mild bilateral pulmonary hypoinflation.  Critical Value/emergent results were called by telephone at the time of interpretation on 06/19/2014 at 8:23 am to Jethro Bolus, RN, who verbally acknowledged these results.   Electronically Signed   By: David  Martinique   On: 06/19/2014 08:21   Dg Chest Portable 1 View In Am  06/18/2014   CLINICAL DATA:  Postop for CABG. Left-sided chest tube. Shortness of breath.  EXAM: PORTABLE CHEST - 1 VIEW  COMPARISON:  1 day prior  FINDINGS: Extubation. Removal of nasogastric tube. Mediastinal drain and bilateral chest tubes remain. Right IJ Swan-Ganz catheter unchanged with tip at proximal right pulmonary artery.  Cardiomegaly accentuated by AP portable technique. Diminished lung volumes. No pleural effusion or pneumothorax. No congestive failure.  IMPRESSION: Extubation with diminished lung volumes. Developing left base atelectasis.  Bilateral chest tubes in place, without pneumothorax.   Electronically Signed   By: Abigail Miyamoto M.D.   On: 06/18/2014 10:22   Dg Chest Portable 1 View  06/17/2014   CLINICAL DATA:  Status post cardiac surgery.  Chest tube.  EXAM:  PORTABLE CHEST - 1 VIEW  COMPARISON:  06/10/2014.  FINDINGS: Endotracheal tube 3.6 cm above the carina. NG tube below hemidiaphragm. Bilateral chest tubes noted. No pneumothorax. No focal pulmonary infiltrate. Mild subsegmental atelectasis bilaterally. Cardiomegaly. Prior median sternotomy. No pulmonary venous congestion .  IMPRESSION: 1. Endotracheal tube, NG tube, bilateral chest tubes in good anatomic position. No pneumothorax. 2. Cardiomegaly.  No evidence of pulmonary venous congestion. 3. Bilateral subsegmental atelectasis.   Electronically Signed   By: Marcello Moores  Register   On: 06/17/2014 13:18     Assessment/Plan: S/P Procedure(s) (LRB): CORONARY ARTERY BYPASS GRAFTING (  CABG) X 4, RIGHT LEG EVH (N/A) TRANSESOPHAGEAL ECHOCARDIOGRAM (TEE) (N/A) Mobilize Diuresis Expected Acute  Blood - loss Anemia Better cough  ? Small left apical ptx, leave left chest tube in d/c right ct today D/c foley     Jaydin Jalomo B 06/19/2014 10:20 AM

## 2014-06-19 NOTE — Plan of Care (Signed)
Problem: Phase I - Pre-Op Goal: Point person for discharge identified Outcome: Completed/Met Date Met:  06/19/14 Herbie [place] (wife)  Problem: Phase II - Intermediate Post-Op Goal: Wean to Extubate Outcome: Completed/Met Date Met:  06/19/14 Extubated to [place] after 4 1/4 hrs post-op.

## 2014-06-19 NOTE — Progress Notes (Signed)
Patient ID: Eric Saunders, male   DOB: 1955-10-02, 58 y.o.   MRN: [id number] EVENING ROUNDS NOTE :     [address]       [place] [address]             [phone]                 2 Days Post-Op Procedure(s) (LRB): CORONARY ARTERY BYPASS GRAFTING (CABG) X 4, RIGHT LEG EVH (N/A) TRANSESOPHAGEAL ECHOCARDIOGRAM (TEE) (N/A)  Total Length of Stay:  LOS: 3 days  BP 105/52  Pulse 87  Temp(Src) 98.9 F (37.2 C) (Oral)  Resp 22  Ht 5\' 8"  (1.727 m)  Wt 205 lb 4 oz (93.1 kg)  BMI 31.22 kg/m2  SpO2 92%  .Intake/Output     10/03 0701 - 10/04 0700 10/04 0701 - 10/05 0700   P.O. 2040 480   I.V. (mL/kg) 791 (8.5) 190 (2)   Blood     IV Piggyback 400    Total Intake(mL/kg) 3231 (34.7) 670 (7.2)   Urine (mL/kg/hr) 2345 (1) 1550 (1.4)   Blood     Chest Tube 435 (0.2) 245 (0.2)   Total Output 2780 1795   Net +451 -1125          . sodium chloride Stopped (06/18/14 1700)  . sodium chloride Stopped (06/18/14 1700)  . sodium chloride    . lactated ringers 20 mL/hr at 06/19/14 1000  . nitroGLYCERIN Stopped (06/17/14 1245)  . phenylephrine (NEO-SYNEPHRINE) Adult infusion Stopped (06/18/14 1050)     Lab Results  Component Value Date   WBC 13.8* 06/19/2014   HGB 10.4* 06/19/2014   HCT 31.3* 06/19/2014   PLT 234 06/19/2014   GLUCOSE 111* 06/19/2014   CHOL 168 01/17/2014   TRIG 135 01/17/2014   HDL 43 01/17/2014   LDLCALC 98 01/17/2014   ALT 25 06/10/2014   AST 14 06/10/2014   NA 136* 06/19/2014   K 4.5 06/19/2014   CL 99 06/19/2014   CREATININE 1.01 06/19/2014   BUN 14 06/19/2014   CO2 27 06/19/2014   PSA 1.4 08/09/2013   INR 1.39 06/17/2014   HGBA1C 6.0* 06/16/2014   Walked in unit, sob and cough improving  Fu cxr in am, and likely remove left chest tube  Grace Isaac MD  [place] Office [phone] 06/19/2014 6:47 PM

## 2014-06-19 NOTE — Progress Notes (Signed)
Radiology called and reported a small left apical pneumothorax, new since last chest X-ray; Report will be given to MD on call

## 2014-06-20 ENCOUNTER — Inpatient Hospital Stay (HOSPITAL_COMMUNITY): Payer: BC Managed Care – PPO

## 2014-06-20 ENCOUNTER — Encounter (HOSPITAL_COMMUNITY): Payer: Self-pay | Admitting: Surgery

## 2014-06-20 LAB — BASIC METABOLIC PANEL
Anion gap: 9 (ref 5–15)
BUN: 17 mg/dL (ref 6–23)
CO2: 30 mEq/L (ref 19–32)
Calcium: 8.5 mg/dL (ref 8.4–10.5)
Chloride: 100 mEq/L (ref 96–112)
Creatinine, Ser: 0.89 mg/dL (ref 0.50–1.35)
GFR calc Af Amer: >90 mL/min (ref >90)
GFR calc non Af Amer: >90 mL/min (ref >90)
Glucose, Bld: 107 mg/dL — ABNORMAL HIGH (ref 70–99)
Potassium: 3.9 mEq/L (ref 3.7–5.3)
Sodium: 139 mEq/L (ref 137–147)

## 2014-06-20 LAB — CBC
HCT: 29.7 % — ABNORMAL LOW (ref 39.0–52.0)
Hemoglobin: 9.8 g/dL — ABNORMAL LOW (ref 13.0–17.0)
MCH: 29 pg (ref 26.0–34.0)
MCHC: 33 g/dL (ref 30.0–36.0)
MCV: 87.9 fL (ref 78.0–100.0)
Platelets: 251 10*3/uL (ref 150–400)
RBC: 3.38 MIL/uL — ABNORMAL LOW (ref 4.22–5.81)
RDW: 13.5 % (ref 11.5–15.5)
WBC: 10.7 10*3/uL — ABNORMAL HIGH (ref 4.0–10.5)

## 2014-06-20 MED FILL — Heparin Sodium (Porcine) Inj 1000 Unit/ML: INTRAMUSCULAR | Qty: 30 | Status: AC

## 2014-06-20 MED FILL — Potassium Chloride Inj 2 mEq/ML: INTRAVENOUS | Qty: 40 | Status: AC

## 2014-06-20 MED FILL — Magnesium Sulfate Inj 50%: INTRAMUSCULAR | Qty: 10 | Status: AC

## 2014-06-20 NOTE — Progress Notes (Addendum)
      [address]       [place] [address]             [phone]       3 Days Post-Op Procedure(s) (LRB): CORONARY ARTERY BYPASS GRAFTING (CABG) X 4, RIGHT LEG EVH (N/A) TRANSESOPHAGEAL ECHOCARDIOGRAM (TEE) (N/A)  Subjective:  Mr. Eric Saunders has no complaints this morning.  He is ambulating around the SICU.  Objective: Vital signs in last 24 hours: Temp:  [98.6 F (37 C)-99.2 F (37.3 C)] 98.6 F (37 C) (10/05 0724) Pulse Rate:  [70-96] 79 (10/05 0700) Cardiac Rhythm:  [-] Normal sinus rhythm (10/04 2000) Resp:  [13-28] 21 (10/05 0700) BP: (91-128)/(52-75) 114/53 mmHg (10/05 0700) SpO2:  [89 %-99 %] 99 % (10/05 0812) Weight:  [202 lb 9.6 oz (91.9 kg)] 202 lb 9.6 oz (91.9 kg) (10/05 0500)  Intake/Output from previous day: 10/04 0701 - 10/05 0700 In: 1090 [P.O.:680; I.V.:410] Out: 2255 [Urine:1950; Chest Tube:305]  General appearance: alert, cooperative and no distress Heart: regular rate and rhythm Lungs: clear to auscultation bilaterally Abdomen: soft, non-tender; bowel sounds normal; no masses,  no organomegaly Wound: clean and dry  Lab Results:  Recent Labs  06/19/14 0333 06/20/14 0347  WBC 13.8* 10.7*  HGB 10.4* 9.8*  HCT 31.3* 29.7*  PLT 234 251   BMET:  Recent Labs  06/19/14 0333 06/20/14 0347  NA 136* 139  K 4.5 3.9  CL 99 100  CO2 27 30  GLUCOSE 111* 107*  BUN 14 17  CREATININE 1.01 0.89  CALCIUM 8.2* 8.5    PT/INR:  Recent Labs  06/17/14 1245  LABPROT 17.1*  INR 1.39   ABG    Component Value Date/Time   PHART 7.336* 06/17/2014 1848   HCO3 25.1* 06/17/2014 1848   TCO2 23 06/18/2014 1608   ACIDBASEDEF 1.0 06/17/2014 1848   O2SAT 90.0 06/17/2014 1848   CBG (last 3)   Recent Labs  06/18/14 2021 06/18/14 2347 06/19/14 0341  GLUCAP 109* 83 108*    Assessment/Plan: S/P Procedure(s) (LRB): CORONARY ARTERY BYPASS GRAFTING (CABG) X 4, RIGHT LEG EVH (N/A) TRANSESOPHAGEAL ECHOCARDIOGRAM (TEE) (N/A)  1. CV-  hemodynamically stable, continue Lopressor 2. Pulm- wean oxygen as tolerated, continue IS 3. Renal- creatinine stable, weight is minimally elevated, will give 3 days of oral Lasix 4. Dispo- patient stable, off all drips, will d/c final chest tube, transfer to 2W  LOS: 4 days    BARRETT, ERIN 06/20/2014  patient examined and medical record reviewed,agree with above note. Doing well 3 days after CABGx4 CXR reviewed and show low lung vols which should improve with IS and ambulation, diuresis VAN TRIGT III,PETER 06/20/2014 \

## 2014-06-20 NOTE — Progress Notes (Signed)
TCTS BRIEF SICU PROGRESS NOTE  3 Days Post-Op  S/P Procedure(s) (LRB): CORONARY ARTERY BYPASS GRAFTING (CABG) X 4, RIGHT LEG EVH (N/A) TRANSESOPHAGEAL ECHOCARDIOGRAM (TEE) (N/A)   Stable day NSR w/ stable BP O2 sats 95-97% on 2 L/min UOP adequate  Plan: Continue current plan.  Waiting for bed on stepdown unit for transfer.  OWEN,CLARENCE H 06/20/2014 7:15 PM

## 2014-06-20 NOTE — Plan of Care (Signed)
Problem: Phase II - Intermediate Post-Op Goal: Pain controlled with appropriate interventions Outcome: Completed/Met Date Met:  06/20/14 Pain controlled throughout the day for walking and procedures and rest.

## 2014-06-20 NOTE — Plan of Care (Signed)
Problem: Phase III - Recovery through Discharge Goal: Activity Progressed Outcome: Completed/Met Date Met:  06/20/14 Up and walking around the unit; 4 times today; one time he took two laps.  Tolerated it very well and states he feels great.

## 2014-06-21 ENCOUNTER — Inpatient Hospital Stay (HOSPITAL_COMMUNITY): Payer: BC Managed Care – PPO

## 2014-06-21 LAB — CBC
HCT: 28.5 % — ABNORMAL LOW (ref 39.0–52.0)
Hemoglobin: 9.4 g/dL — ABNORMAL LOW (ref 13.0–17.0)
MCH: 28.8 pg (ref 26.0–34.0)
MCHC: 33 g/dL (ref 30.0–36.0)
MCV: 87.4 fL (ref 78.0–100.0)
Platelets: 318 10*3/uL (ref 150–400)
RBC: 3.26 MIL/uL — ABNORMAL LOW (ref 4.22–5.81)
RDW: 13.4 % (ref 11.5–15.5)
WBC: 8.4 10*3/uL (ref 4.0–10.5)

## 2014-06-21 LAB — BASIC METABOLIC PANEL
Anion gap: 13 (ref 5–15)
BUN: 17 mg/dL (ref 6–23)
CO2: 28 mEq/L (ref 19–32)
Calcium: 8.6 mg/dL (ref 8.4–10.5)
Chloride: 100 mEq/L (ref 96–112)
Creatinine, Ser: 0.89 mg/dL (ref 0.50–1.35)
GFR calc Af Amer: >90 mL/min (ref >90)
GFR calc non Af Amer: >90 mL/min (ref >90)
Glucose, Bld: 101 mg/dL — ABNORMAL HIGH (ref 70–99)
Potassium: 3.9 mEq/L (ref 3.7–5.3)
Sodium: 141 mEq/L (ref 137–147)

## 2014-06-21 MED ORDER — LACTULOSE 10 GM/15ML PO SOLN
20.0000 g | Freq: Every day | ORAL | Status: DC | PRN
  Filled 2014-06-21: qty 30

## 2014-06-21 NOTE — Progress Notes (Signed)
06/21/2014 4:36 PM Nursing note Pt. Ambulated independently 500 ft with family around unit on RA. Pt. Tolerated well. Encouraged further ambulation this evening.  Evalisse Prajapati, Arville Lime

## 2014-06-21 NOTE — Progress Notes (Signed)
CARDIAC REHAB PHASE I   PRE:  Rate/Rhythm: 81 SR  BP:  Supine:   Sitting: 129/74  Standing:    SaO2: 95% 1.5L  MODE:  Ambulation: 440 ft   POST:  Rate/Rhythm: 83  BP:  Supine:   Sitting: 118/60  Standing:    SaO2: 92% room RA, 89-94 %RA 1330-1415 Pt walked 440 ft on RA with rolling walker and little assistance. Gait steady. Tolerated well. Pt very motivated to walk. Used IS and flutter valve after we walked. Left off oxygen and notified RN to check again in a little while as it was stable during walk. Monitored sats whole walk. Pt stated recliner felt better than bed. Put pillows in recliner to help pt with comfort. Can try without walker next walk.   Graylon Good, RN BSN  06/21/2014 2:08 PM

## 2014-06-21 NOTE — Progress Notes (Addendum)
      [address]       [place] [address]             [phone]      4 Days Post-Op Procedure(s) (LRB): CORONARY ARTERY BYPASS GRAFTING (CABG) X 4, RIGHT LEG EVH (N/A) TRANSESOPHAGEAL ECHOCARDIOGRAM (TEE) (N/A)  Subjective:  Eric Saunders complains of being hot this morning.  He otherwise has no complaints.  He is ambulating around the SICU without difficulty.  + BM, + Flatus  Objective: Vital signs in last 24 hours: Temp:  [98.1 F (36.7 C)-98.7 F (37.1 C)] 98.5 F (36.9 C) (10/06 0700) Pulse Rate:  [63-89] 71 (10/06 0700) Cardiac Rhythm:  [-] Normal sinus rhythm (10/06 0800) Resp:  [14-34] 26 (10/06 0700) BP: (96-150)/(56-97) 131/78 mmHg (10/06 0700) SpO2:  [90 %-99 %] 96 % (10/06 0752) Weight:  [201 lb 3.2 oz (91.264 kg)] 201 lb 3.2 oz (91.264 kg) (10/06 0500)   Intake/Output from previous day: 10/05 0701 - 10/06 0700 In: 640 [P.O.:480; I.V.:160] Out: 800 [Urine:800] Intake/Output this shift: Total I/O In: -  Out: 125 [Urine:125]  General appearance: alert, cooperative and no distress Heart: regular rate and rhythm Lungs: diminished breath sounds bibasilar Abdomen: soft, non-tender; bowel sounds normal; no masses,  no organomegaly Extremities: edema trace Wound: clean and dry  Lab Results:  Recent Labs  06/20/14 0347 06/21/14 0259  WBC 10.7* 8.4  HGB 9.8* 9.4*  HCT 29.7* 28.5*  PLT 251 318   BMET:  Recent Labs  06/20/14 0347 06/21/14 0259  NA 139 141  K 3.9 3.9  CL 100 100  CO2 30 28  GLUCOSE 107* 101*  BUN 17 17  CREATININE 0.89 0.89  CALCIUM 8.5 8.6    PT/INR: No results found for this basename: LABPROT, INR,  in the last 72 hours ABG    Component Value Date/Time   PHART 7.336* 06/17/2014 1848   HCO3 25.1* 06/17/2014 1848   TCO2 23 06/18/2014 1608   ACIDBASEDEF 1.0 06/17/2014 1848   O2SAT 90.0 06/17/2014 1848   CBG (last 3)   Recent Labs  06/18/14 2021 06/18/14 2347 06/19/14 0341  GLUCAP 109* 83 108*     Assessment/Plan: S/P Procedure(s) (LRB): CORONARY ARTERY BYPASS GRAFTING (CABG) X 4, RIGHT LEG EVH (N/A) TRANSESOPHAGEAL ECHOCARDIOGRAM (TEE) (N/A)  1. CV- hemodynamically stable- continue Lopressor 2. Pulm- wean oxygen as tolerated, good use of IS/Flutter valve 3. Renal- creatinine, lytes okay weight is stable, continue Lasix for now 4. Dispo- patient continues to do well, awaiting bed on 2W   LOS: 5 days    BARRETT, ERIN 06/21/2014  Ready for transfer to telemetry patient examined and medical record reviewed,agree with above note. VAN TRIGT III,PETER 06/21/2014

## 2014-06-22 MED ORDER — LISINOPRIL 2.5 MG PO TABS
2.5000 mg | ORAL_TABLET | Freq: Every day | ORAL | Status: DC
  Administered 2014-06-22 – 2014-06-23 (×2): 2.5 mg via ORAL
  Filled 2014-06-22 (×2): qty 1

## 2014-06-22 MED ORDER — DM-GUAIFENESIN ER 30-600 MG PO TB12
1.0000 | ORAL_TABLET | Freq: Two times a day (BID) | ORAL | Status: DC | PRN
  Administered 2014-06-22 – 2014-06-23 (×3): 1 via ORAL
  Filled 2014-06-22 (×3): qty 1

## 2014-06-22 NOTE — Progress Notes (Signed)
Pulled bilateral pacing wires. Wires intact. Pt tolerated well vs stable. Monitoring will continue.

## 2014-06-22 NOTE — Progress Notes (Signed)
[address]       [place] [address]             [phone]      5 Days Post-Op Procedure(s) (LRB): CORONARY ARTERY BYPASS GRAFTING (CABG) X 4, RIGHT LEG EVH (N/A) TRANSESOPHAGEAL ECHOCARDIOGRAM (TEE) (N/A) Subjective: Feels ok, some sputum prouction  Objective: Vital signs in last 24 hours: Temp:  [98.1 F (36.7 C)-99 F (37.2 C)] 98.9 F (37.2 C) (10/07 0620) Pulse Rate:  [69-91] 69 (10/07 0620) Cardiac Rhythm:  [-] Normal sinus rhythm (10/06 2000) Resp:  [18-24] 18 (10/07 0620) BP: (115-134)/(63-89) 133/72 mmHg (10/07 0620) SpO2:  [92 %-99 %] 93 % (10/07 0620) Weight:  [199 lb 8 oz (90.493 kg)] 199 lb 8 oz (90.493 kg) (10/07 0338)  Hemodynamic parameters for last 24 hours:    Intake/Output from previous day: 10/06 0701 - 10/07 0700 In: 240 [P.O.:240] Out: 125 [Urine:125] Intake/Output this shift:    General appearance: alert, cooperative and no distress Heart: regular rate and rhythm Lungs: dim in bases Abdomen: benign Extremities: + BLE edema Wound: incis healing well  Lab Results:  Recent Labs  06/20/14 0347 06/21/14 0259  WBC 10.7* 8.4  HGB 9.8* 9.4*  HCT 29.7* 28.5*  PLT 251 318   BMET:  Recent Labs  06/20/14 0347 06/21/14 0259  NA 139 141  K 3.9 3.9  CL 100 100  CO2 30 28  GLUCOSE 107* 101*  BUN 17 17  CREATININE 0.89 0.89  CALCIUM 8.5 8.6    PT/INR: No results found for this basename: LABPROT, INR,  in the last 72 hours ABG    Component Value Date/Time   PHART 7.336* 06/17/2014 1848   HCO3 25.1* 06/17/2014 1848   TCO2 23 06/18/2014 1608   ACIDBASEDEF 1.0 06/17/2014 1848   O2SAT 90.0 06/17/2014 1848   CBG (last 3)  No results found for this basename: GLUCAP,  in the last 72 hours  Meds Scheduled Meds: . acetaminophen  1,000 mg Oral 4 times per day   Or  . acetaminophen (TYLENOL) oral liquid 160 mg/5 mL  1,000 mg Per Tube 4 times per day  . acetylcysteine  4 mL Nebulization BID  . antiseptic oral rinse   7 mL Mouth Rinse BID  . aspirin EC  325 mg Oral Daily   Or  . aspirin  324 mg Per Tube Daily  . bisacodyl  10 mg Oral Daily   Or  . bisacodyl  10 mg Rectal Daily  . docusate sodium  200 mg Oral Daily  . enoxaparin (LOVENOX) injection  40 mg Subcutaneous Q24H  . fenofibrate  160 mg Oral Daily  . metoprolol tartrate  12.5 mg Oral BID   Or  . metoprolol tartrate  12.5 mg Per Tube BID  . pantoprazole  40 mg Oral Daily  . simvastatin  40 mg Oral q1800  . sodium chloride  3 mL Intravenous Q12H   Continuous Infusions: . sodium chloride Stopped (06/18/14 1700)  . sodium chloride Stopped (06/18/14 1700)  . sodium chloride    . lactated ringers 20 mL/hr at 06/19/14 1000   PRN Meds:.ALPRAZolam, lactulose, levalbuterol, metoprolol, morphine injection, ondansetron (ZOFRAN) IV, oxyCODONE, sodium chloride, traMADol  Xrays Dg Chest 2 View  06/21/2014   CLINICAL DATA:  58 year old male chest tube removal. Initial encounter.  EXAM: CHEST  2 VIEW  COMPARISON:  06/20/2014 and earlier.  FINDINGS: PA and lateral views of the chest. Right IJ introducer sheath  removed. Left chest tube removed. Epicardial pacer wires remain.  Continued low lung volumes. Small left apical pneumothorax (arrow). Bilateral pleural effusions, small to moderate and greater on the left. Associated confluent bibasilar opacity. Stable cardiac size and mediastinal contours. Visualized tracheal air column is within normal limits. No pulmonary edema. Mildly increased gas at the splenic flexure, no dilated bowel loops identified. Stable visualized osseous structures.  IMPRESSION: 1. Small or trace left apical pneumothorax status post left chest tube removal. 2. Small to moderate bilateral pleural effusions with bibasilar atelectasis, greater on the left.   Electronically Signed   By: Lars Pinks M.D.   On: 06/21/2014 07:09    Assessment/Plan: S/P Procedure(s) (LRB): CORONARY ARTERY BYPASS GRAFTING (CABG) X 4, RIGHT LEG EVH  (N/A) TRANSESOPHAGEAL ECHOCARDIOGRAM (TEE) (N/A)  1 doing well 2 cont diuresis 3 pulm toilet 4 rehab 5 d/c epw's 6 poss home in am    LOS: 6 days    Eric Saunders E 06/22/2014

## 2014-06-22 NOTE — Progress Notes (Signed)
CARDIAC REHAB PHASE I   PRE:  Rate/Rhythm: 76 SR  BP:  Supine:   Sitting:   Standing:    SaO2:  Up in hall with wife  MODE:  Ambulation: 830 ft   POST:  Rate/Rhythm: 71  BP:  Supine:   Sitting: 114/63  Standing:    SaO2: 95%RA 1415-1525 Pt up in hall walking independently with wife so walked with pt. Tolerated well. Walked 830 ft with steady gait. Completed education with pt and wife who voiced understanding. Discussed IS, flutter valve, sternal precautions. Gave smoking cessation handouts and encouraged him to call 1800quitnow as needed. Gave fake cigarette. Pt had quit for 10 years before by doing cold [place]. Discussed CRP 2 and pt gave permission to refer to [facility] program. Pt said he would do  The best he can to make changes.    Graylon Good, RN BSN  06/22/2014 3:23 PM

## 2014-06-22 NOTE — Discharge Instructions (Signed)
Endoscopic Saphenous Vein Harvesting °Care After °Refer to this sheet in the next few weeks. These instructions provide you with information on caring for yourself after your procedure. Your health care provider may also give you more specific instructions. Your treatment has been planned according to current medical practices, but problems sometimes occur. Call your health care provider if you have any problems or questions after your procedure. °HOME CARE INSTRUCTIONS °Medicine °· Take whatever pain medicine your surgeon prescribes. Follow the directions carefully. Do not take over-the-counter pain medicine unless your surgeon says it is okay. Some pain medicine can cause bleeding problems for several weeks after surgery. °· Follow your surgeon's instructions about driving. You will probably not be permitted to drive after heart surgery. °· Take any medicines your surgeon prescribes. Any medicines you took before your heart surgery should be checked with your health care provider before you start taking them again. °Wound care °· If your surgeon has prescribed an elastic bandage or stocking, ask how long you should wear it. °· Check the area around your surgical cuts (incisions) whenever your bandages (dressings) are changed. Look for any redness or swelling. °· You will need to return to have the stitches (sutures) or staples taken out. Ask your surgeon when to do that. °· Ask your surgeon when you can shower or bathe. °Activity °· Try to keep your legs raised when you are sitting. °· Do any exercises your health care providers have given you. These may include deep breathing exercises, coughing, walking, or other exercises. °SEEK MEDICAL CARE IF: °· You have any questions about your medicines. °· You have more leg pain, especially if your pain medicine stops working. °· New or growing bruises develop on your leg. °· Your leg swells, feels tight, or becomes red. °· You have numbness in your leg. °SEEK IMMEDIATE  MEDICAL CARE IF: °· Your pain gets much worse. °· Blood or fluid leaks from any of the incisions. °· Your incisions become warm, swollen, or red. °· You have chest pain. °· You have trouble breathing. °· You have a fever. °· You have more pain near your leg incision. °MAKE SURE YOU: °· Understand these instructions. °· Will watch your condition. °· Will get help right away if you are not doing well or get worse. °Document Released: 05/15/2011 Document Revised: 09/07/2013 Document Reviewed: 05/15/2011 °ExitCare® Patient Information ©2015 [employer]. This information is not intended to replace advice given to you by your health care provider. Make sure you discuss any questions you have with your health care provider. °Coronary Artery Bypass Grafting, Care After °These instructions give you information on caring for yourself after your procedure. Your doctor may also give you more specific instructions. Call your doctor if you have any problems or questions after your procedure.  °HOME CARE °· Only take medicine as told by your doctor. Take medicines exactly as told. Do not stop taking medicines or start any new medicines without talking to your doctor first. °· Take your pulse as told by your doctor. °· Do deep breathing as told by your doctor. Use your breathing device (incentive spirometer), if given, to practice deep breathing several times a day. Support your chest with a pillow or your arms when you take deep breaths or cough. °· Keep the area clean, dry, and protected where the surgery cuts (incisions) were made. Remove bandages (dressings) only as told by your doctor. If strips were applied to surgical area, do not take them off. They fall off   on their own. °· Check the surgery area daily for puffiness (swelling), redness, or leaking fluid. °· If surgery cuts were made in your legs: °· Avoid crossing your legs. °· Avoid sitting for long periods of time. Change positions every 30 minutes. °· Raise your legs  when you are sitting. Place them on pillows. °· Wear stockings that help keep blood clots from forming in your legs (compression stockings). °· Only take sponge baths until your doctor says it is okay to take showers. Pat the surgery area dry. Do not rub the surgery area with a washcloth or towel. Do not bathe, swim, or use a hot tub until your doctor says it is okay. °· Eat foods that are high in fiber. These include raw fruits and vegetables, whole grains, beans, and nuts. Choose lean meats. Avoid canned, processed, and fried foods. °· Drink enough fluids to keep your pee (urine) clear or pale yellow. °· Weigh yourself every day. °· Rest and limit activity as told by your doctor. You may be told to: °· Stop any activity if you have chest pain, shortness of breath, changes in heartbeat, or dizziness. Get help right away if this happens. °· Move around often for short amounts of time or take short walks as told by your doctor. Gradually become more active. You may need help to strengthen your muscles and build endurance. °· Avoid lifting, pushing, or pulling anything heavier than 10 pounds (4.5 kg) for at least 6 weeks after surgery. °· Do not drive until your doctor says it is okay. °· Ask your doctor when you can go back to work. °· Ask your doctor when you can begin sexual activity again. °· Follow up with your doctor as told. °GET HELP IF: °· You have puffiness, redness, more pain, or fluid draining from the incision site. °· You have a fever. °· You have puffiness in your ankles or legs. °· You have pain in your legs. °· You gain 2 or more pounds (0.9 kg) a day. °· You feel sick to your stomach (nauseous) or throw up (vomit). °· You have watery poop (diarrhea). °GET HELP RIGHT AWAY IF: °· You have chest pain that goes to your jaw or arms. °· You have shortness of breath. °· You have a fast or irregular heartbeat. °· You notice a "clicking" in your breastbone when you move. °· You have numbness or weakness in  your arms or legs. °· You feel dizzy or light-headed. °MAKE SURE YOU: °· Understand these instructions. °· Will watch your condition. °· Will get help right away if you are not doing well or get worse. °Document Released: 09/07/2013 Document Reviewed: 09/07/2013 °ExitCare® Patient Information ©2015 [employer]. This information is not intended to replace advice given to you by your health care provider. Make sure you discuss any questions you have with your health care provider. ° °

## 2014-06-22 NOTE — Discharge Summary (Signed)
Physician Discharge Summary  Patient ID: Eric Saunders MRN: [id number] DOB/AGE: 06/02/1956 58 y.o.  Admit date: 06/16/2014 Discharge date: 06/22/2014  Admission Diagnoses: Severe multivessel coronary artery disease  History of the present illness:  The patient is a 58 year old male with no previous history of coronary artery disease. He does have a 30-pack-year smoking history as well as hypertension and hyperlipidemia. Recently at work while lifting a heavy item he developed chest pain. There were no associated symptoms at that time. He had a treadmill stress test which showed ST depression at peak exercise. Due to this finding, cardiac catheterization was recommended. He was admitted this hospitalization for the procedure.  Past Medical History   Diagnosis  Date   .  Hypertension    .  Hyperlipemia    .  Coronary artery disease     Past Surgical History   Procedure  Laterality  Date   .  Knee surgery       bilateral arthroscopic    Family History   Problem  Relation  Age of Onset   .  Drug abuse  Sister    .  Healthy  Brother    .  Diabetes  Sister    .  Healthy  Sister    .  Healthy  Sister    .  Healthy  Sister    .  Healthy  Sister    .  Healthy  Brother     Social History: reports that he has been smoking Cigarettes. He has a 30 pack-year smoking history. He has never used smokeless tobacco. He reports that he drinks alcohol. He reports that he does not use illicit drugs.  Allergies: No Known Allergies  Medications:  I have reviewed the patient's current medications.  Prior to Admission:  Prescriptions prior to admission   Medication  Sig  Dispense  Refill   .  ALPRAZolam (XANAX) 0.5 MG tablet  Take 0.5 mg by mouth 2 (two) times daily as needed for anxiety.     .  fenofibrate 160 MG tablet  Take 160 mg by mouth daily.     Marland Kitchen  ibuprofen (ADVIL,MOTRIN) 800 MG tablet  Take 800 mg by mouth as needed for mild pain.     Marland Kitchen  omeprazole (PRILOSEC OTC) 20 MG tablet  Take 20 mg  by mouth as needed (when he eats spicy foods).     .  simvastatin (ZOCOR) 40 MG tablet  Take 40 mg by mouth daily.     .  valsartan-hydrochlorothiazide (DIOVAN-HCT) 320-25 MG per tablet  Take 1 tablet by mouth daily.      Scheduled:  .  albuterol  2.5 mg  Nebulization  Once   .  [START ON 06/17/2014] aminocaproic acid (AMICAR) for OHS   Intravenous  To OR   .  aspirin      .  [START ON 06/17/2014] aspirin  81 mg  Oral  Daily   .  [START ON 06/17/2014] cefUROXime (ZINACEF) IV  1.5 g  Intravenous  To OR   .  [START ON 06/17/2014] cefUROXime (ZINACEF) IV  750 mg  Intravenous  To OR   .  [START ON 06/17/2014] dexmedetomidine  0.1-0.7 mcg/kg/hr  Intravenous  To OR   .  [START ON 06/17/2014] DOPamine  2-20 mcg/kg/min  Intravenous  To OR   .  [START ON 06/17/2014] epinephrine  0.5-20 mcg/min  Intravenous  To OR   .  fenofibrate  160 mg  Oral  Daily   .  [  START ON 06/17/2014] heparin-papaverine-plasmalyte irrigation   Irrigation  To OR   .  [START ON 06/17/2014] heparin 30,000 units/NS 1000 mL solution for CELLSAVER   Other  To OR   .  irbesartan  300 mg  Oral  Daily    And   .  hydrochlorothiazide  25 mg  Oral  Daily   .  [START ON 06/17/2014] insulin (NOVOLIN-R) infusion   Intravenous  To OR   .  [START ON 06/17/2014] magnesium sulfate  40 mEq  Other  To OR   .  [START ON 06/17/2014] nitroGLYCERIN  2-200 mcg/min  Intravenous  To OR   .  [START ON 06/17/2014] phenylephrine (NEO-SYNEPHRINE) Adult infusion  30-200 mcg/min  Intravenous  To OR   .  [START ON 06/17/2014] potassium chloride  80 mEq  Other  To OR   .  simvastatin  40 mg  Oral  q1800   .  [START ON 06/17/2014] vancomycin  1,250 mg  Intravenous  To     Discharge Diagnoses:  Active Problems:   Unstable angina   S/P CABG x 4  Patient Active Problem List   Diagnosis Date Noted  . S/P CABG x 4 06/17/2014  . Unstable angina 06/16/2014  . Abnormal EKG 01/25/2014  . Essential hypertension, benign 12/28/2012  . Hyperlipidemia LDL goal < 100  12/28/2012  . GAD (generalized anxiety disorder) 12/28/2012   Problem List Items Addressed This Visit   None    Visit Diagnoses   Pre-procedural cardiovascular examination    -  Primary    Relevant Medications       aspirin chewable tablet 81 mg (Completed)    Coronary artery disease involving native coronary artery of native heart without angina pectoris        Relevant Medications       fenofibrate 160 MG tablet       simvastatin (ZOCOR) 40 MG tablet       valsartan-hydrochlorothiazide (DIOVAN-HCT) 320-25 MG per tablet       aspirin chewable tablet 81 mg (Completed)       verapamil (ISOPTIN) 2.5 MG/ML injection (Completed)       nitroGLYCERIN 100 MCG/ML intra-arterial injection (Completed)       heparin 2-0.9 UNIT/ML-% infusion (Completed)       heparin 1000 UNIT/ML injection (Completed)       nitroGLYCERIN 50 mg in dextrose 5 % 250 mL (0.2 mg/mL) infusion (Completed)       metoprolol tartrate (LOPRESSOR) tablet 12.5 mg (Completed)       aspirin EC tablet 325 mg       aspirin chewable tablet 324 mg       metoprolol (LOPRESSOR) injection 2.5-5 mg       metoprolol tartrate (LOPRESSOR) tablet 12.5 mg       metoprolol tartrate (LOPRESSOR) 25 mg/10 mL oral suspension 12.5 mg       furosemide (LASIX) injection 40 mg (Completed)       fenofibrate tablet 160 mg       simvastatin (ZOCOR) tablet 40 mg       furosemide (LASIX) injection 40 mg (Completed)       enoxaparin (LOVENOX) injection 40 mg    Other Relevant Orders       Pulmonary Function Test (Completed)       Discharged Condition: good  Hospital Course: The patient was admitted for elective cardiac catheterization on 06/16/2014. The following results were noted:  Cardiac Catheterization Operative Report  Eric Laity  Saunders  [id number]  10/1/20159:29 AM  Redge Gainer, MD  Procedure Performed:  1. Left Heart Catheterization 2. Selective Coronary Angiography 3. Left ventricular angiogram Operator: Lauree Chandler, MD   Arterial access site: Right radial artery.  Indication: 58 yo male with history of tobacco abuse, HTN, HLD with recent chest pain with exertion concerning for unstable angina.  Procedure Details:  The risks, benefits, complications, treatment options, and expected outcomes were discussed with the patient. The patient and/or family concurred with the proposed plan, giving informed consent. The patient was brought to the cath lab after IV hydration was begun and oral premedication was given. The patient was further sedated with Versed and Fentanyl. The right wrist was assessed with a reverse Allens test which was positive. The right wrist was prepped and draped in a sterile fashion. 1% lidocaine was used for local anesthesia. Using the modified Seldinger access technique, a 5 French sheath was placed in the right radial artery. 3 mg Verapamil was given through the sheath. 4500 units IV heparin was given. Standard diagnostic catheters were used to perform selective coronary angiography. Ultimately I was able to best engage the RCA with a No-Torque catheter. A pigtail catheter was used to perform a left ventricular angiogram. The sheath was removed from the right radial artery and a Terumo hemostasis band was applied at the arteriotomy site on the right wrist.  There were no immediate complications. The patient was taken to the recovery area in stable condition.  Hemodynamic Findings:  Central aortic pressure: 149/78  Left ventricular pressure: 154/9/16  Angiographic Findings:  Left main: Distal 10% stenosis.  Left Anterior Descending Artery: Large caliber vessel that courses to the apex. The mid vessel is diffusely disease with diffuse 50% stenosis starting just past the Diagonal branch. There is a focal hazy 80-90% mid stenosis. The distal vessel has diffuse mild plaque. The diagonal branch is moderate in caliber with ostial 50% stenosis, long segment of 80% mid stenosis.  Circumflex Artery: Large caliber  vessel with ostial 40% stenosis, mid 60% stenosis and termination into a moderate caliber obtuse marginal branch. The obtuse marginal branch has serial 95% stenoses.  Right Coronary Artery: Large dominant vessel with proximal to mid 80-90% stenosis. The PDA and PLA have minor plaque disease.  Left Ventricular Angiogram: LVEF=55%  Impression:  1. Severe triple vessel CAD  2. Unstable angina  3. Normal LV systolic function  Recommendations: Will ask CT surgery to see today to discuss CABG.  Complications: None. The patient tolerated the procedure well.  The patient was seen in cardiothoracic surgical consultation by Gilford Raid M.D. who evaluated the patient and his studies and increased with recommendations to proceed with coronary artery surgical revascularization. The procedure was scheduled and on 06/17/2014 he was taken to the operating room where he underwent the following surgical procedure:  10:50 AM  PATIENT: Eric Saunders 58 y.o. male  PRE-OPERATIVE DIAGNOSIS: CAD  POST-OPERATIVE DIAGNOSIS: CAD  PROCEDURE: Procedure(s):  CORONARY ARTERY BYPASS GRAFTING (CABG)X4 LIMA-LAD; SVG-OM; SVG-DIAG; SVG-RCA  TRANSESOPHAGEAL ECHOCARDIOGRAM (TEE)  [facility]- RIGHT LEG  SURGEON: Surgeon(s):  Gaye Pollack, MD  PHYSICIAN ASSISTANT: WAYNE GOLD PA-C The patient tolerated procedure well and was taken to the surgical intensive care unit in stable condition.  Postoperative hospital course:  Overall, the patient has progressed nicely. He was weaned from the ventilator without difficulty using standard protocols. He has had some pulmonary congestion and wheezing. He has responded to aggressive pulmonary measures including nebs and pulmonary toilet. He has  been weaned from oxygen and is maintaining good saturations. He does have an expected acute blood loss anemia which has stabilized. He is tolerating gradually increasing activities using standard protocols. He is maintaining normal sinus rhythm without  cardiac dysrhythmias or ectopy. Incisions are healing well without evidence of infection. His overall status is felt to be tentatively stable for discharge in the next 24-48 hours pending ongoing reevaluation of his recovery.    Consults: None  Significant Diagnostic Studies: Cardiac catheterization  Treatments: surgery: as  noted  Discharge Exam: Blood pressure 133/72, pulse 69, temperature 98.9 F (37.2 C), temperature source Oral, resp. rate 18, height 5\' 8"  (1.727 m), weight 199 lb 8 oz (90.493 kg), SpO2 93.00%.  General appearance: alert, cooperative and no distress  Heart: regular rate and rhythm  Lungs: clear to auscultation bilaterally  Abdomen: benign  Extremities: + LE edema  Wound: incis healing well     Disposition: 01-Home or Self Care   Medications at time of discharge:   Medication List    STOP taking these medications       ibuprofen 800 MG tablet  Commonly known as:  ADVIL,MOTRIN     valsartan-hydrochlorothiazide 320-25 MG per tablet  Commonly known as:  DIOVAN-HCT      TAKE these medications       ALPRAZolam 0.5 MG tablet  Commonly known as:  XANAX  Take 0.5 mg by mouth 2 (two) times daily as needed for anxiety.     aspirin 325 MG EC tablet  Take 1 tablet (325 mg total) by mouth daily.     fenofibrate 160 MG tablet  Take 160 mg by mouth daily.     lisinopril 10 MG tablet  Commonly known as:  PRINIVIL,ZESTRIL  Take 1 tablet (10 mg total) by mouth daily.     metoprolol tartrate 12.5 mg Tabs tablet  Commonly known as:  LOPRESSOR  Take 0.5 tablets (12.5 mg total) by mouth 2 (two) times daily.     omeprazole 20 MG tablet  Commonly known as:  PRILOSEC OTC  Take 20 mg by mouth as needed (when he eats spicy foods).     oxyCODONE 5 MG immediate release tablet  Commonly known as:  Oxy IR/ROXICODONE  Take 1-2 tablets (5-10 mg total) by mouth every 4 (four) hours as needed for severe pain.     simvastatin 40 MG tablet  Commonly known as:   ZOCOR  Take 40 mg by mouth daily.       Follow-up Information   Follow up with Gaye Pollack, MD On 07/27/2014. (Please obtain a chest x-ray at [facility] at 10 AM, then see MD at 11:00.)    Specialty:  Cardiothoracic Surgery   Contact information:   [place] [address] [phone]       Follow up with Minus Breeding, MD In 2 weeks. (11/3/ 2015 at 4:30 pm)    Specialty:  Cardiology   Contact information:   [address] [place] [address] [phone]       Follow up with [facility]. (at Dr Earlene Plater office- for suture removal by the nurse- they will contact you with the time/date)      The patient has been discharged on:   1.Beta Blocker:  Yes [  x ]  No   [   ]                              If No, reason:  2.Ace Inhibitor/ARB: Yes [ x  ]                                     No  [    ]                                     If No, reason:  3.Statin:   Yes [ x  ]                  No  [   ]                  If No, reason:  4.Ecasa:  Yes  [ x  ]                  No   [   ]                  If No, reason:  Signed: GOLD,WAYNE E 06/22/2014, 9:21 AM

## 2014-06-23 MED ORDER — OXYCODONE HCL 5 MG PO TABS: 5.0000 mg | ORAL_TABLET | ORAL | Status: DC | PRN

## 2014-06-23 MED ORDER — LISINOPRIL 10 MG PO TABS: 10.0000 mg | ORAL_TABLET | Freq: Every day | ORAL | Status: DC

## 2014-06-23 MED ORDER — METOPROLOL TARTRATE 12.5 MG HALF TABLET: 12.5000 mg | ORAL_TABLET | Freq: Two times a day (BID) | ORAL | Status: DC

## 2014-06-23 MED ORDER — ASPIRIN 325 MG PO TBEC: 325.0000 mg | DELAYED_RELEASE_TABLET | Freq: Every day | ORAL | Status: DC

## 2014-06-23 NOTE — Progress Notes (Signed)
      [place] [address]       [employer] [address]             [phone]      6 Days Post-Op Procedure(s) (LRB): CORONARY ARTERY BYPASS GRAFTING (CABG) X 4, RIGHT LEG EVH (N/A) TRANSESOPHAGEAL ECHOCARDIOGRAM (TEE) (N/A) Subjective: Feels good  Objective: Vital signs in last 24 hours: Temp:  [97.4 F (36.3 C)-99.2 F (37.3 C)] 99.2 F (37.3 C) (10/08 0405) Pulse Rate:  [68-79] 79 (10/08 0405) Cardiac Rhythm:  [-] Normal sinus rhythm (10/07 2015) Resp:  [18-20] 19 (10/08 0405) BP: (120-139)/(60-77) 120/60 mmHg (10/08 0405) SpO2:  [91 %-98 %] 91 % (10/08 0405) Weight:  [197 lb 14.4 oz (89.767 kg)] 197 lb 14.4 oz (89.767 kg) (10/08 0405)  Hemodynamic parameters for last 24 hours:    Intake/Output from previous day: 10/07 0701 - 10/08 0700 In: 720 [P.O.:720] Out: 2 [Urine:1; Stool:1] Intake/Output this shift:    General appearance: alert, cooperative and no distress Heart: regular rate and rhythm Lungs: clear to auscultation bilaterally Abdomen: benign Extremities: + LE edema Wound: incis healing well  Lab Results:  Recent Labs  06/21/14 0259  WBC 8.4  HGB 9.4*  HCT 28.5*  PLT 318   BMET:  Recent Labs  06/21/14 0259  NA 141  K 3.9  CL 100  CO2 28  GLUCOSE 101*  BUN 17  CREATININE 0.89  CALCIUM 8.6    PT/INR: No results found for this basename: LABPROT, INR,  in the last 72 hours ABG    Component Value Date/Time   PHART 7.336* 06/17/2014 1848   HCO3 25.1* 06/17/2014 1848   TCO2 23 06/18/2014 1608   ACIDBASEDEF 1.0 06/17/2014 1848   O2SAT 90.0 06/17/2014 1848   CBG (last 3)  No results found for this basename: GLUCAP,  in the last 72 hours  Meds Scheduled Meds: . acetylcysteine  4 mL Nebulization BID  . antiseptic oral rinse  7 mL Mouth Rinse BID  . aspirin EC  325 mg Oral Daily   Or  . aspirin  324 mg Per Tube Daily  . bisacodyl  10 mg Oral Daily   Or  . bisacodyl  10 mg Rectal Daily  . docusate sodium  200 mg Oral Daily  .  enoxaparin (LOVENOX) injection  40 mg Subcutaneous Q24H  . fenofibrate  160 mg Oral Daily  . lisinopril  2.5 mg Oral Daily  . metoprolol tartrate  12.5 mg Oral BID   Or  . metoprolol tartrate  12.5 mg Per Tube BID  . pantoprazole  40 mg Oral Daily  . simvastatin  40 mg Oral q1800  . sodium chloride  3 mL Intravenous Q12H   Continuous Infusions: . sodium chloride Stopped (06/18/14 1700)  . sodium chloride Stopped (06/18/14 1700)  . sodium chloride    . lactated ringers 20 mL/hr at 06/19/14 1000   PRN Meds:.ALPRAZolam, dextromethorphan-guaiFENesin, lactulose, levalbuterol, metoprolol, morphine injection, ondansetron (ZOFRAN) IV, oxyCODONE, sodium chloride, traMADol  Xrays No results found.  Assessment/Plan: S/P Procedure(s) (LRB): CORONARY ARTERY BYPASS GRAFTING (CABG) X 4, RIGHT LEG EVH (N/A) TRANSESOPHAGEAL ECHOCARDIOGRAM (TEE) (N/A) Plan for discharge: see discharge orders Increase lisinopril to 10 q day  LOS: 7 days    GOLD,WAYNE E 06/23/2014

## 2014-06-23 NOTE — Progress Notes (Signed)
Patient d/c this morning very stable. D/c instructions and prescriptions given to him and his wife. All questions answered and educated to follow up with the MD on scheduled date and time. Assessments remained unchanged prior to discharge.

## 2014-06-25 ENCOUNTER — Telehealth: Payer: Self-pay | Admitting: Thoracic Surgery (Cardiothoracic Vascular Surgery)

## 2014-06-25 NOTE — Telephone Encounter (Signed)
Eric Saunders had CABG by Dr. Cyndia Bent on 06/17/2014  He called today with multiple complaints.  1. He complains that his right elbow is "on fire"- he described severe pain and swelling- I recommended that he go to a nearby ED or urgent care. He said it is better after putting ice on it and he doesn't want to go to ED/ UC.  2. He complains of a fever of 100.6. - may be related to elbow  3. Pain in sternal area after choking/ coughing last night. Does not feel any clicking or popping or movement in the sternum.  4. BP of 133/78- I reassured him that that was WNL.  He refused repeated suggestions to be seen re: the elbow pain and fever. He says he will just ride it out and see what happens.

## 2014-06-26 ENCOUNTER — Emergency Department (HOSPITAL_COMMUNITY): Payer: BC Managed Care – PPO

## 2014-06-26 ENCOUNTER — Encounter (HOSPITAL_COMMUNITY): Payer: Self-pay | Admitting: Emergency Medicine

## 2014-06-26 ENCOUNTER — Inpatient Hospital Stay (HOSPITAL_COMMUNITY)
Admission: EM | Admit: 2014-06-26 | Discharge: 2014-06-27 | DRG: 554 | Disposition: A | Discharge status: Home / Self Care | Payer: BC Managed Care – PPO | Attending: Internal Medicine | Admitting: Internal Medicine

## 2014-06-26 DIAGNOSIS — E785 Hyperlipidemia, unspecified: Secondary | ICD-10-CM | POA: Diagnosis present

## 2014-06-26 DIAGNOSIS — I2510 Atherosclerotic heart disease of native coronary artery without angina pectoris: Secondary | ICD-10-CM | POA: Diagnosis present

## 2014-06-26 DIAGNOSIS — L03113 Cellulitis of right upper limb: Secondary | ICD-10-CM | POA: Diagnosis present

## 2014-06-26 DIAGNOSIS — Z7982 Long term (current) use of aspirin: Secondary | ICD-10-CM

## 2014-06-26 DIAGNOSIS — M109 Gout, unspecified: Secondary | ICD-10-CM | POA: Diagnosis present

## 2014-06-26 DIAGNOSIS — M10021 Idiopathic gout, right elbow: Principal | ICD-10-CM | POA: Diagnosis present

## 2014-06-26 DIAGNOSIS — Z951 Presence of aortocoronary bypass graft: Secondary | ICD-10-CM | POA: Diagnosis not present

## 2014-06-26 DIAGNOSIS — M7021 Olecranon bursitis, right elbow: Secondary | ICD-10-CM

## 2014-06-26 DIAGNOSIS — M10042 Idiopathic gout, left hand: Secondary | ICD-10-CM | POA: Diagnosis present

## 2014-06-26 DIAGNOSIS — D649 Anemia, unspecified: Secondary | ICD-10-CM | POA: Diagnosis present

## 2014-06-26 DIAGNOSIS — F17210 Nicotine dependence, cigarettes, uncomplicated: Secondary | ICD-10-CM | POA: Diagnosis present

## 2014-06-26 DIAGNOSIS — L03119 Cellulitis of unspecified part of limb: Secondary | ICD-10-CM

## 2014-06-26 DIAGNOSIS — Z79899 Other long term (current) drug therapy: Secondary | ICD-10-CM

## 2014-06-26 DIAGNOSIS — I10 Essential (primary) hypertension: Secondary | ICD-10-CM | POA: Diagnosis present

## 2014-06-26 DIAGNOSIS — K5900 Constipation, unspecified: Secondary | ICD-10-CM | POA: Diagnosis present

## 2014-06-26 DIAGNOSIS — D473 Essential (hemorrhagic) thrombocythemia: Secondary | ICD-10-CM | POA: Diagnosis present

## 2014-06-26 LAB — CBC WITH DIFFERENTIAL/PLATELET
BASOS ABS: 0 10*3/uL (ref 0.0–0.1)
BASOS PCT: 0 % (ref 0–1)
EOS PCT: 1 % (ref 0–5)
Eosinophils Absolute: 0.1 10*3/uL (ref 0.0–0.7)
HCT: 33.6 % — ABNORMAL LOW (ref 39.0–52.0)
Hemoglobin: 11.3 g/dL — ABNORMAL LOW (ref 13.0–17.0)
LYMPHS ABS: 2.5 10*3/uL (ref 0.7–4.0)
Lymphocytes Relative: 18 % (ref 12–46)
MCH: 29 pg (ref 26.0–34.0)
MCHC: 33.6 g/dL (ref 30.0–36.0)
MCV: 86.2 fL (ref 78.0–100.0)
Monocytes Absolute: 0.7 10*3/uL (ref 0.1–1.0)
Monocytes Relative: 5 % (ref 3–12)
NEUTROS PCT: 76 % (ref 43–77)
Neutro Abs: 10.3 10*3/uL — ABNORMAL HIGH (ref 1.7–7.7)
PLATELETS: 656 10*3/uL — AB (ref 150–400)
RBC: 3.9 MIL/uL — AB (ref 4.22–5.81)
RDW: 13.3 % (ref 11.5–15.5)
WBC: 13.6 10*3/uL — ABNORMAL HIGH (ref 4.0–10.5)

## 2014-06-26 LAB — SYNOVIAL CELL COUNT + DIFF, W/ CRYSTALS
LYMPHOCYTES-SYNOVIAL FLD: 6 % (ref 0–20)
MONOCYTE-MACROPHAGE-SYNOVIAL FLUID: 7 % — AB (ref 50–90)
Neutrophil, Synovial: 87 % — ABNORMAL HIGH (ref 0–25)
WBC, Synovial: UNDETERMINED /mm3 (ref 0–200)

## 2014-06-26 LAB — BASIC METABOLIC PANEL
ANION GAP: 16 — AB (ref 5–15)
BUN: 15 mg/dL (ref 6–23)
CO2: 21 mEq/L (ref 19–32)
Calcium: 9.5 mg/dL (ref 8.4–10.5)
Chloride: 99 mEq/L (ref 96–112)
Creatinine, Ser: 0.9 mg/dL (ref 0.50–1.35)
GFR calc non Af Amer: >90 mL/min (ref >90)
Glucose, Bld: 89 mg/dL (ref 70–99)
POTASSIUM: 3.8 meq/L (ref 3.7–5.3)
SODIUM: 136 meq/L — AB (ref 137–147)

## 2014-06-26 LAB — GRAM STAIN

## 2014-06-26 LAB — SEDIMENTATION RATE: SED RATE: 85 mm/h — AB (ref 0–16)

## 2014-06-26 MED ORDER — ONDANSETRON HCL 4 MG/2ML IJ SOLN: 4.0000 mg | Freq: Four times a day (QID) | INTRAMUSCULAR | Status: DC | PRN

## 2014-06-26 MED ORDER — ALUM & MAG HYDROXIDE-SIMETH 200-200-20 MG/5ML PO SUSP: 30.0000 mL | Freq: Four times a day (QID) | ORAL | Status: DC | PRN

## 2014-06-26 MED ORDER — HYDROMORPHONE HCL 1 MG/ML IJ SOLN
1.0000 mg | Freq: Once | INTRAMUSCULAR | Status: AC
  Administered 2014-06-26: 1 mg via INTRAVENOUS
  Filled 2014-06-26: qty 1

## 2014-06-26 MED ORDER — METOPROLOL TARTRATE 12.5 MG HALF TABLET
12.5000 mg | ORAL_TABLET | Freq: Two times a day (BID) | ORAL | Status: DC
  Administered 2014-06-26 – 2014-06-27 (×2): 12.5 mg via ORAL
  Filled 2014-06-26 (×4): qty 1

## 2014-06-26 MED ORDER — LIDOCAINE HCL (PF) 1 % IJ SOLN
5.0000 mL | Freq: Once | INTRAMUSCULAR | Status: DC
  Filled 2014-06-26: qty 5

## 2014-06-26 MED ORDER — ACETAMINOPHEN 650 MG RE SUPP: 650.0000 mg | Freq: Four times a day (QID) | RECTAL | Status: DC | PRN

## 2014-06-26 MED ORDER — FLEET ENEMA 7-19 GM/118ML RE ENEM
1.0000 | ENEMA | Freq: Every day | RECTAL | Status: DC | PRN
  Filled 2014-06-26: qty 1

## 2014-06-26 MED ORDER — POLYETHYLENE GLYCOL 3350 17 G PO PACK
17.0000 g | PACK | Freq: Every day | ORAL | Status: DC
  Filled 2014-06-26 (×3): qty 1

## 2014-06-26 MED ORDER — BISACODYL 10 MG RE SUPP: 10.0000 mg | Freq: Every day | RECTAL | Status: DC | PRN

## 2014-06-26 MED ORDER — ONDANSETRON HCL 4 MG PO TABS: 4.0000 mg | ORAL_TABLET | Freq: Four times a day (QID) | ORAL | Status: DC | PRN

## 2014-06-26 MED ORDER — ASPIRIN EC 325 MG PO TBEC
325.0000 mg | DELAYED_RELEASE_TABLET | Freq: Every day | ORAL | Status: DC
  Administered 2014-06-27: 325 mg via ORAL
  Filled 2014-06-26 (×2): qty 1

## 2014-06-26 MED ORDER — PANTOPRAZOLE SODIUM 40 MG PO TBEC
40.0000 mg | DELAYED_RELEASE_TABLET | Freq: Every day | ORAL | Status: DC
  Administered 2014-06-27: 40 mg via ORAL
  Filled 2014-06-26: qty 1

## 2014-06-26 MED ORDER — HYDROMORPHONE HCL 1 MG/ML IJ SOLN
1.0000 mg | INTRAMUSCULAR | Status: DC | PRN
  Administered 2014-06-26: 1 mg via INTRAVENOUS
  Filled 2014-06-26: qty 1

## 2014-06-26 MED ORDER — HYDROCODONE-ACETAMINOPHEN 5-325 MG PO TABS: 1.0000 | ORAL_TABLET | ORAL | Status: DC | PRN

## 2014-06-26 MED ORDER — IBUPROFEN 800 MG PO TABS
800.0000 mg | ORAL_TABLET | Freq: Three times a day (TID) | ORAL | Status: DC | PRN
  Filled 2014-06-26: qty 1

## 2014-06-26 MED ORDER — DOCUSATE SODIUM 100 MG PO CAPS
100.0000 mg | ORAL_CAPSULE | Freq: Two times a day (BID) | ORAL | Status: DC
  Administered 2014-06-26: 100 mg via ORAL
  Filled 2014-06-26 (×3): qty 1

## 2014-06-26 MED ORDER — SODIUM CHLORIDE 0.9 % IJ SOLN
3.0000 mL | Freq: Two times a day (BID) | INTRAMUSCULAR | Status: DC
  Administered 2014-06-26: 3 mL via INTRAVENOUS

## 2014-06-26 MED ORDER — LISINOPRIL 10 MG PO TABS
10.0000 mg | ORAL_TABLET | Freq: Every day | ORAL | Status: DC
  Administered 2014-06-27: 10 mg via ORAL
  Filled 2014-06-26 (×2): qty 1

## 2014-06-26 MED ORDER — VANCOMYCIN HCL IN DEXTROSE 1-5 GM/200ML-% IV SOLN
1000.0000 mg | Freq: Once | INTRAVENOUS | Status: AC
  Administered 2014-06-26: 1000 mg via INTRAVENOUS
  Filled 2014-06-26: qty 200

## 2014-06-26 MED ORDER — ONDANSETRON HCL 4 MG/2ML IJ SOLN: 4.0000 mg | Freq: Three times a day (TID) | INTRAMUSCULAR | Status: AC | PRN

## 2014-06-26 MED ORDER — FENOFIBRATE 160 MG PO TABS
160.0000 mg | ORAL_TABLET | Freq: Every day | ORAL | Status: DC
  Administered 2014-06-27: 160 mg via ORAL
  Filled 2014-06-26 (×2): qty 1

## 2014-06-26 MED ORDER — SODIUM CHLORIDE 0.9 % IV SOLN
INTRAVENOUS | Status: DC
  Administered 2014-06-26: 16:00:00 via INTRAVENOUS

## 2014-06-26 MED ORDER — EZETIMIBE 10 MG PO TABS
10.0000 mg | ORAL_TABLET | Freq: Every day | ORAL | Status: DC
  Administered 2014-06-27: 10 mg via ORAL
  Filled 2014-06-26 (×2): qty 1

## 2014-06-26 MED ORDER — ENOXAPARIN SODIUM 40 MG/0.4ML ~~LOC~~ SOLN
40.0000 mg | SUBCUTANEOUS | Status: DC
  Administered 2014-06-26: 40 mg via SUBCUTANEOUS
  Filled 2014-06-26 (×3): qty 0.4

## 2014-06-26 MED ORDER — ALPRAZOLAM 0.5 MG PO TABS: 0.5000 mg | ORAL_TABLET | Freq: Two times a day (BID) | ORAL | Status: DC | PRN

## 2014-06-26 MED ORDER — VANCOMYCIN HCL 10 G IV SOLR
1500.0000 mg | Freq: Two times a day (BID) | INTRAVENOUS | Status: DC
  Administered 2014-06-27: 1500 mg via INTRAVENOUS
  Filled 2014-06-26 (×2): qty 1500

## 2014-06-26 MED ORDER — ACETAMINOPHEN 325 MG PO TABS: 650.0000 mg | ORAL_TABLET | Freq: Four times a day (QID) | ORAL | Status: DC | PRN

## 2014-06-26 MED ORDER — HYDROMORPHONE HCL 1 MG/ML IJ SOLN: 1.0000 mg | INTRAMUSCULAR | Status: DC | PRN

## 2014-06-26 MED ORDER — SIMVASTATIN 40 MG PO TABS
40.0000 mg | ORAL_TABLET | Freq: Every day | ORAL | Status: DC
  Administered 2014-06-26: 40 mg via ORAL
  Filled 2014-06-26 (×3): qty 1

## 2014-06-26 NOTE — ED Notes (Addendum)
Pt just released from here for heart cath and surgery. Pt presents for right elbow swelling today and warm to touch. No hx of arthritis. States it was on fire yesterday.

## 2014-06-26 NOTE — Consult Note (Signed)
Reason for Consult:  Right elbow swelling and cellulitis; questionable infected olecranon bursitis Referring Physician:   EDP  Eric Saunders is an 58 y.o. male.  HPI:   58 yo male with 24 hour history of right elbow and arm pain, swelling, and redness.  Denies any specific injury.  He did rest his elbow down on the arm rest of his chair for a long time.  He did have a recent cardiac cath thru his right arm radial artery.  Past Medical History  Diagnosis Date  . Hypertension   . Hyperlipemia   . Coronary artery disease     Past Surgical History  Procedure Laterality Date  . Knee surgery      bilateral arthroscopic  . Coronary artery bypass graft N/A 06/17/2014    Procedure: CORONARY ARTERY BYPASS GRAFTING (CABG) X 4, RIGHT LEG EVH;  Surgeon: Gaye Pollack, MD;  Location: [facility];  Service: Open Heart Surgery;  Laterality: N/A;  . Tee without cardioversion N/A 06/17/2014    Procedure: TRANSESOPHAGEAL ECHOCARDIOGRAM (TEE);  Surgeon: Gaye Pollack, MD;  Location: [facility];  Service: Open Heart Surgery;  Laterality: N/A;    Family History  Problem Relation Age of Onset  . Drug abuse Sister   . Healthy Brother   . Diabetes Sister   . Healthy Sister   . Healthy Sister   . Healthy Sister   . Healthy Sister   . Healthy Brother     Social History:  reports that he has been smoking Cigarettes.  He has a 30 pack-year smoking history. He has never used smokeless tobacco. He reports that he drinks alcohol. He reports that he does not use illicit drugs.  Allergies: No Known Allergies  Medications: I have reviewed the patient's current medications.  Results for orders placed during the hospital encounter of 06/26/14 (from the past 48 hour(s))  CBC WITH DIFFERENTIAL     Status: Abnormal   Collection Time    06/26/14  1:23 PM      Result Value Ref Range   WBC 13.6 (*) 4.0 - 10.5 K/uL   RBC 3.90 (*) 4.22 - 5.81 MIL/uL   Hemoglobin 11.3 (*) 13.0 - 17.0 g/dL   HCT 33.6 (*) 39.0 - 52.0 %   MCV 86.2  78.0 - 100.0 fL   MCH 29.0  26.0 - 34.0 pg   MCHC 33.6  30.0 - 36.0 g/dL   RDW 13.3  11.5 - 15.5 %   Platelets 656 (*) 150 - 400 K/uL   Neutrophils Relative % 76  43 - 77 %   Neutro Abs 10.3 (*) 1.7 - 7.7 K/uL   Lymphocytes Relative 18  12 - 46 %   Lymphs Abs 2.5  0.7 - 4.0 K/uL   Monocytes Relative 5  3 - 12 %   Monocytes Absolute 0.7  0.1 - 1.0 K/uL   Eosinophils Relative 1  0 - 5 %   Eosinophils Absolute 0.1  0.0 - 0.7 K/uL   Basophils Relative 0  0 - 1 %   Basophils Absolute 0.0  0.0 - 0.1 K/uL    Dg Elbow Complete Right  06/26/2014   CLINICAL DATA:  58 year old male with acute onset right elbow joint swelling. One to the touch. Initial encounter. Recent CABG.  EXAM: RIGHT ELBOW - COMPLETE 3+ VIEW  COMPARISON:  None.  FINDINGS: Widespread soft tissue swelling about the right elbow joint, more so medially. Associated subcutaneous stranding. This continues into the visible forearm.  At the same time, no definite joint effusion. Advanced degenerative changes at the olecranon, with chronic fragmentation and osteophytosis. Alignment preserved. No acute fracture identified.  IMPRESSION: Severe soft tissue swelling at the right elbow tracking into the forearm. No underlying acute osseous abnormality, but advanced degenerative changes about the olecranon.   Electronically Signed   By: Lars Pinks M.D.   On: 06/26/2014 13:32   Dg Abd Acute W/chest  06/26/2014   CLINICAL DATA:  Status post recent CABG on 06/17/2014. Cough, congestion, low-grade fever, constipation and abdominal distention.  EXAM: ACUTE ABDOMEN SERIES (ABDOMEN 2 VIEW & CHEST 1 VIEW)  COMPARISON:  06/21/2014  FINDINGS: Resolution of left pneumothorax. There is a small to moderate left pleural effusion with associated left basilar atelectasis. The heart size is normal. No pulmonary edema identified.  Abdominal films show no evidence of bowel obstruction or ileus. No significant retained fecal material. No abnormal  calcifications. Mild degenerative changes are present throughout the lumbar spine.  IMPRESSION: No evidence of acute bowel obstruction or ileus. Resolution of left pneumothorax postoperatively with small a moderate left pleural effusion present.   Electronically Signed   By: Aletta Edouard M.D.   On: 06/26/2014 14:05    ROS Blood pressure 147/85, pulse 91, temperature 98.1 F (36.7 C), temperature source Oral, resp. rate 23, height 5\' 8"  (1.727 m), weight 89.812 kg (198 lb), SpO2 97.00%. Physical Exam  Musculoskeletal:       Right elbow: He exhibits swelling. Tenderness found. Olecranon process tenderness noted.       Arms:  He has good elbow range-of-motion with no associated joint pain at all (not suspicious for a septic joint at all) I do not see or fell any problems associated with his recent cardiac cath thru his right radial artery   Assessment/Plan: Right elbow olecranon bursitis with right arm cellulitis 1)  I did clean his elbow with betadine and alcohol and was able to aspirate a small amount of thin clear fluid from the bursa before it became blood-tinged from the traumatic aspiration.  Did not look infected, but will sen off for gram stain and culture 2)  Given a slightly elevated WBC, his pain, fever, and cellulitis, would have the Hospitalists admit for overnight observation and IV antibiotics.  Can likely then go home late tomorrow on oral antibiotics and instructions for ice and elevation as well as not resting his right elbow on the arm rest of his chair or putting pressure on it for the next 2 weeks  Eric Saunders Y 06/26/2014, 3:41 PM

## 2014-06-26 NOTE — Progress Notes (Signed)
Pt and wife stated he had several of the medications ordered for this evening already today.  I charted "not given" for the ones he had already taken.

## 2014-06-26 NOTE — H&P (Signed)
History and Physical       Hospital Admission Note Date: 06/26/2014  Patient name: Eric Saunders Medical record number: [id number] Date of birth: 27-Jul-1956 Age: 58 y.o. Gender: male  PCP: Redge Gainer, MD    Chief Complaint:  Right elbow swelling with pain for last 3 days  HPI: Patient is a 58 year old male with history of hypertension, hyperlipidemia, CAD with CABG x4 on 06/17/14 by Dr. Cyndia Bent, who was just discharged on 06/22/14 presented to ED with right elbow, or pain and swelling for last 3 days. History was obtained from the patient who reported that he noticed some pain next morning after he got home in his right elbow but not very bothersome. However yesterday, his pain became severe and he noticed swelling in his right elbow and arm. He felt his right elbow was 'on fire'. Patient used ice and took oxycodone for pain which did not help. He reported that ibuprofen did help the pain, he subsequently started having low-grade fever 100.6 today. Patient called CT surgery office and was recommended to come to the urgent care or the ER. Orthopedics was consulted in the ER, patient was seen by Dr. Ninfa Linden who aspirated his right elbow and the fluid was sent for Gram stain and culture. Patient also reported severe constipation and had no bowel movement in the last 3 days.  Review of Systems:  Constitutional: + fever, chills, diaphoresis, poor appetite and fatigue.  HEENT: Denies photophobia, eye pain, redness, hearing loss, ear pain, congestion, sore throat, rhinorrhea, sneezing, mouth sores, trouble swallowing, neck pain, neck stiffness and tinnitus.   Respiratory: Denies SOB, DOE, cough, chest tightness,  and wheezing.   Cardiovascular: Denies chest pain, palpitations and leg swelling.  Gastrointestinal: Denies nausea, vomiting, abdominal pain, + constipation, denies blood in stool and abdominal distention.  Genitourinary: Denies  dysuria, urgency, frequency, hematuria, flank pain and difficulty urinating.  Musculoskeletal: Denies myalgias, back pain, joint swelling, arthralgias and gait problem.  Skin: Denies pallor, rash and wound.  Neurological: Denies dizziness, seizures, syncope, weakness, light-headedness, numbness and headaches.  Hematological: Denies adenopathy. Easy bruising, personal or family bleeding history  Psychiatric/Behavioral: Denies suicidal ideation, mood changes, confusion, nervousness, sleep disturbance and agitation  Past Medical History: Past Medical History  Diagnosis Date  . Hypertension   . Hyperlipemia   . Coronary artery disease    Past Surgical History  Procedure Laterality Date  . Knee surgery      bilateral arthroscopic  . Coronary artery bypass graft N/A 06/17/2014    Procedure: CORONARY ARTERY BYPASS GRAFTING (CABG) X 4, RIGHT LEG EVH;  Surgeon: Gaye Pollack, MD;  Location: [facility];  Service: Open Heart Surgery;  Laterality: N/A;  . Tee without cardioversion N/A 06/17/2014    Procedure: TRANSESOPHAGEAL ECHOCARDIOGRAM (TEE);  Surgeon: Gaye Pollack, MD;  Location: [facility];  Service: Open Heart Surgery;  Laterality: N/A;    Medications: Prior to Admission medications   Medication Sig Start Date End Date Taking? Authorizing Provider  ALPRAZolam Duanne Moron) 0.5 MG tablet Take 0.5 mg by mouth 2 (two) times daily as needed for anxiety.   Yes Historical Provider, MD  aspirin EC 325 MG EC tablet Take 1 tablet (325 mg total) by mouth daily. 06/23/14  Yes Wayne E Gold, PA-C  ezetimibe (ZETIA) 10 MG tablet Take 10 mg by mouth daily.   Yes Historical Provider, MD  fenofibrate 160 MG tablet Take 160 mg by mouth daily.   Yes Historical Provider, MD  ibuprofen (ADVIL,MOTRIN) 800 MG  tablet Take 800 mg by mouth every 8 (eight) hours as needed for moderate pain.   Yes Historical Provider, MD  lisinopril (PRINIVIL,ZESTRIL) 10 MG tablet Take 1 tablet (10 mg total) by mouth daily. 06/23/14  Yes Wayne E  Gold, PA-C  metoprolol tartrate (LOPRESSOR) 25 MG tablet Take 12.5 mg by mouth 2 (two) times daily.   Yes Historical Provider, MD  omeprazole (PRILOSEC) 20 MG capsule Take 20 mg by mouth daily as needed (Acid reflux).   Yes Historical Provider, MD  simvastatin (ZOCOR) 40 MG tablet Take 40 mg by mouth daily.   Yes Historical Provider, MD    Allergies:  No Known Allergies  Social History:  reports that he has been smoking Cigarettes.  He has a 30 pack-year smoking history. He has never used smokeless tobacco. He reports that he drinks alcohol. He reports that he does not use illicit drugs.  Family History: Family History  Problem Relation Age of Onset  . Drug abuse Sister   . Healthy Brother   . Diabetes Sister   . Healthy Sister   . Healthy Sister   . Healthy Sister   . Healthy Sister   . Healthy Brother     Physical Exam: Blood pressure 141/75, pulse 90, temperature 98.1 F (36.7 C), temperature source Oral, resp. rate 23, height 5\' 8"  (1.727 m), weight 89.812 kg (198 lb), SpO2 95.00%. General: Alert, awake, oriented x3, in no acute distress. HEENT: normocephalic, atraumatic, anicteric sclera, pink conjunctiva, pupils equal and reactive to light and accomodation, oropharynx clear Neck: supple, no masses or lymphadenopathy, no goiter, no bruits  Heart: Regular rate and rhythm, without murmurs, rubs or gallops. Lungs: Clear to auscultation bilaterally, no wheezing, rales or rhonchi. Abdomen: Soft, nontender, nondistended, positive bowel sounds, no masses. Extremities: No clubbing, cyanosis or edema with positive pedal pulses. Right arm and elbow with swelling, tenderness and erythema. Range of motion is almost normal. Neuro: Grossly intact, no focal neurological deficits, strength 5/5 upper and lower extremities bilaterally Psych: alert and oriented x 3, normal mood and affect Skin: no rashes or lesions, warm and dry   LABS on Admission:  Basic Metabolic Panel:  Recent  Labs Lab 06/21/14 0259 06/26/14 1323  NA 141 136*  K 3.9 3.8  CL 100 99  CO2 28 21  GLUCOSE 101* 89  BUN 17 15  CREATININE 0.89 0.90  CALCIUM 8.6 9.5   Liver Function Tests: No results found for this basename: AST, ALT, ALKPHOS, BILITOT, PROT, ALBUMIN,  in the last 168 hours No results found for this basename: LIPASE, AMYLASE,  in the last 168 hours No results found for this basename: AMMONIA,  in the last 168 hours CBC:  Recent Labs Lab 06/21/14 0259 06/26/14 1323  WBC 8.4 13.6*  NEUTROABS  --  10.3*  HGB 9.4* 11.3*  HCT 28.5* 33.6*  MCV 87.4 86.2  PLT 318 656*   Cardiac Enzymes: No results found for this basename: CKTOTAL, CKMB, CKMBINDEX, TROPONINI,  in the last 168 hours BNP: No components found with this basename: POCBNP,  CBG: No results found for this basename: GLUCAP,  in the last 168 hours   Radiological Exams on Admission:  Dg Elbow Complete Right  06/26/2014   CLINICAL DATA:  58 year old male with acute onset right elbow joint swelling. One to the touch. Initial encounter. Recent CABG.  EXAM: RIGHT ELBOW - COMPLETE 3+ VIEW  COMPARISON:  None.  FINDINGS: Widespread soft tissue swelling about the right elbow joint, more so  medially. Associated subcutaneous stranding. This continues into the visible forearm. At the same time, no definite joint effusion. Advanced degenerative changes at the olecranon, with chronic fragmentation and osteophytosis. Alignment preserved. No acute fracture identified.  IMPRESSION: Severe soft tissue swelling at the right elbow tracking into the forearm. No underlying acute osseous abnormality, but advanced degenerative changes about the olecranon.   Electronically Signed   By: Lars Pinks M.D.   On: 06/26/2014 13:32   Dg Abd Acute W/chest  06/26/2014   CLINICAL DATA:  Status post recent CABG on 06/17/2014. Cough, congestion, low-grade fever, constipation and abdominal distention.  EXAM: ACUTE ABDOMEN SERIES (ABDOMEN 2 VIEW & CHEST 1  VIEW)  COMPARISON:  06/21/2014  FINDINGS: Resolution of left pneumothorax. There is a small to moderate left pleural effusion with associated left basilar atelectasis. The heart size is normal. No pulmonary edema identified.  Abdominal films show no evidence of bowel obstruction or ileus. No significant retained fecal material. No abnormal calcifications. Mild degenerative changes are present throughout the lumbar spine.  IMPRESSION: No evidence of acute bowel obstruction or ileus. Resolution of left pneumothorax postoperatively with small a moderate left pleural effusion present.   Electronically Signed   By: Aletta Edouard M.D.   On: 06/26/2014 14:05    Assessment/Plan Principal Problem:   Right arm cellulitis with possible right olecranon bursitis - Status post aspiration in ED by Dr. Ninfa Linden, follow fluid Gram stain and culture - Placed on IV vancomycin, pain control  Active Problems:   Essential hypertension, benign - Currently stable, continue lisinopril, beta blocker    S/P CABG x 4 - Currently stable, Dr. Wyvonnia Dusky EDP, discussed with Dr. Roxan Hockey who did not feel his symptoms are from catheterization or the surgery. - Continue all cardiac medications    Constipation - Will place on Colace, MiraLax and Dulcolax suppository - enema as needed  DVT prophylaxis: Lovenox  CODE STATUS: Full code  Family Communication: Admission, patients condition and plan of care including tests being ordered have been discussed with the patient who indicates understanding and agree with the plan and Code Status   Further plan will depend as patient's clinical course evolves and further radiologic and laboratory data become available.   Time Spent on Admission: 1 hour  Sri Clegg M.D. [facility] 06/26/2014, 4:44 PM Pager: [phone]  If 7PM-7AM, please contact night-coverage [email or web address] Password [email or web address]

## 2014-06-26 NOTE — ED Provider Notes (Signed)
CSN: [id number]     Arrival date & time 06/26/14  1138 History   First MD Initiated Contact with Patient 06/26/14 1304     Chief Complaint  Patient presents with  . Joint Swelling     (Consider location/radiation/quality/duration/timing/severity/associated sxs/prior Treatment) HPI Comments: Patient presents with right elbow swelling since yesterday morning. Denies any trauma. Has had fever at home up to 100.6. He underwent coronary artery bypass grafting on October 2. He went home on October 7. Denies any anticoagulant use. Denies any weakness, numbness or tingling. He called his surgeon and was told to come to ER. No pain in any other joints. No chest pain or shortness of breath. States he has not moved his bowels in 4 days.  The history is provided by the patient.    Past Medical History  Diagnosis Date  . Hypertension   . Hyperlipemia   . Coronary artery disease    Past Surgical History  Procedure Laterality Date  . Knee surgery      bilateral arthroscopic  . Coronary artery bypass graft N/A 06/17/2014    Procedure: CORONARY ARTERY BYPASS GRAFTING (CABG) X 4, RIGHT LEG EVH;  Surgeon: Gaye Pollack, MD;  Location: [facility];  Service: Open Heart Surgery;  Laterality: N/A;  . Tee without cardioversion N/A 06/17/2014    Procedure: TRANSESOPHAGEAL ECHOCARDIOGRAM (TEE);  Surgeon: Gaye Pollack, MD;  Location: [facility];  Service: Open Heart Surgery;  Laterality: N/A;   Family History  Problem Relation Age of Onset  . Drug abuse Sister   . Healthy Brother   . Diabetes Sister   . Healthy Sister   . Healthy Sister   . Healthy Sister   . Healthy Sister   . Healthy Brother    History  Substance Use Topics  . Smoking status: Current Every Day Smoker -- 1.00 packs/day for 30 years    Types: Cigarettes  . Smokeless tobacco: Never Used  . Alcohol Use: Yes     Comment: occasionally    Review of Systems  Constitutional: Positive for fever. Negative for activity change and appetite  change.  Respiratory: Negative for cough, chest tightness and shortness of breath.   Gastrointestinal: Negative for nausea, vomiting and abdominal pain.  Genitourinary: Negative for dysuria and hematuria.  Musculoskeletal: Positive for arthralgias, joint swelling and myalgias. Negative for neck stiffness.  Skin: Negative for rash.  Neurological: Negative for dizziness, weakness and headaches.  A complete 10 system review of systems was obtained and all systems are negative except as noted in the HPI and PMH.      Allergies  Review of patient's allergies indicates no known allergies.  Home Medications   Prior to Admission medications   Medication Sig Start Date End Date Taking? Authorizing Provider  ALPRAZolam Duanne Moron) 0.5 MG tablet Take 0.5 mg by mouth 2 (two) times daily as needed for anxiety.   Yes Historical Provider, MD  aspirin EC 325 MG EC tablet Take 1 tablet (325 mg total) by mouth daily. 06/23/14  Yes Wayne E Gold, PA-C  ezetimibe (ZETIA) 10 MG tablet Take 10 mg by mouth daily.   Yes Historical Provider, MD  fenofibrate 160 MG tablet Take 160 mg by mouth daily.   Yes Historical Provider, MD  ibuprofen (ADVIL,MOTRIN) 800 MG tablet Take 800 mg by mouth every 8 (eight) hours as needed for moderate pain.   Yes Historical Provider, MD  lisinopril (PRINIVIL,ZESTRIL) 10 MG tablet Take 1 tablet (10 mg total) by mouth daily. 06/23/14  Yes Wayne E Gold, PA-C  metoprolol tartrate (LOPRESSOR) 25 MG tablet Take 12.5 mg by mouth 2 (two) times daily.   Yes Historical Provider, MD  omeprazole (PRILOSEC) 20 MG capsule Take 20 mg by mouth daily as needed (Acid reflux).   Yes Historical Provider, MD  simvastatin (ZOCOR) 40 MG tablet Take 40 mg by mouth daily.   Yes Historical Provider, MD   BP 150/79  Pulse 90  Temp(Src) 99.1 F (37.3 C) (Oral)  Resp 20  Ht 5\' 8"  (1.727 m)  Wt 190 lb 11.2 oz (86.5 kg)  BMI 29.00 kg/m2  SpO2 100% Physical Exam  Nursing note and vitals  reviewed. Constitutional: He is oriented to person, place, and time. He appears well-developed and well-nourished. No distress.  HENT:  Head: Normocephalic and atraumatic.  Mouth/Throat: Oropharynx is clear and moist. No oropharyngeal exudate.  Eyes: Conjunctivae and EOM are normal. Pupils are equal, round, and reactive to light.  Neck: Normal range of motion. Neck supple.  No meningismus.  Cardiovascular: Normal rate, regular rhythm, normal heart sounds and intact distal pulses.   No murmur heard. Pulmonary/Chest: Effort normal and breath sounds normal. No respiratory distress.  Abdominal: Soft. There is no tenderness. There is no rebound and no guarding.  Musculoskeletal: Normal range of motion. He exhibits edema and tenderness.  Erythema, edema overlying right olecranon Full range of motion of the elbow joint Intact radial pulse.   Neurological: He is alert and oriented to person, place, and time. No cranial nerve deficit. He exhibits normal muscle tone. Coordination normal.  No ataxia on finger to nose bilaterally. No pronator drift. 5/5 strength throughout. CN 2-12 intact. Negative Romberg. Equal grip strength. Sensation intact. Gait is normal.   Skin: Skin is warm.  Psychiatric: He has a normal mood and affect. His behavior is normal.    ED Course  Procedures (including critical care time) Labs Review Labs Reviewed  CBC WITH DIFFERENTIAL - Abnormal; Notable for the following:    WBC 13.6 (*)    RBC 3.90 (*)    Hemoglobin 11.3 (*)    HCT 33.6 (*)    Platelets 656 (*)    Neutro Abs 10.3 (*)    All other components within normal limits  BASIC METABOLIC PANEL - Abnormal; Notable for the following:    Sodium 136 (*)    Anion gap 16 (*)    All other components within normal limits  SEDIMENTATION RATE - Abnormal; Notable for the following:    Sed Rate 85 (*)    All other components within normal limits  SYNOVIAL CELL COUNT + DIFF, W/ CRYSTALS - Abnormal; Notable for the  following:    Color, Synovial RED (*)    Appearance-Synovial TURBID (*)    Neutrophil, Synovial 87 (*)    Monocyte-Macrophage-Synovial Fluid 7 (*)    All other components within normal limits  GRAM STAIN  CULTURE, BLOOD (ROUTINE X 2)  CULTURE, BLOOD (ROUTINE X 2)  BODY FLUID CULTURE  C-REACTIVE PROTEIN  BASIC METABOLIC PANEL  CBC  CBC  CREATININE, SERUM    Imaging Review Dg Elbow Complete Right  06/26/2014   CLINICAL DATA:  58 year old male with acute onset right elbow joint swelling. One to the touch. Initial encounter. Recent CABG.  EXAM: RIGHT ELBOW - COMPLETE 3+ VIEW  COMPARISON:  None.  FINDINGS: Widespread soft tissue swelling about the right elbow joint, more so medially. Associated subcutaneous stranding. This continues into the visible forearm. At the same time, no definite joint  effusion. Advanced degenerative changes at the olecranon, with chronic fragmentation and osteophytosis. Alignment preserved. No acute fracture identified.  IMPRESSION: Severe soft tissue swelling at the right elbow tracking into the forearm. No underlying acute osseous abnormality, but advanced degenerative changes about the olecranon.   Electronically Signed   By: Lars Pinks M.D.   On: 06/26/2014 13:32   Dg Abd Acute W/chest  06/26/2014   CLINICAL DATA:  Status post recent CABG on 06/17/2014. Cough, congestion, low-grade fever, constipation and abdominal distention.  EXAM: ACUTE ABDOMEN SERIES (ABDOMEN 2 VIEW & CHEST 1 VIEW)  COMPARISON:  06/21/2014  FINDINGS: Resolution of left pneumothorax. There is a small to moderate left pleural effusion with associated left basilar atelectasis. The heart size is normal. No pulmonary edema identified.  Abdominal films show no evidence of bowel obstruction or ileus. No significant retained fecal material. No abnormal calcifications. Mild degenerative changes are present throughout the lumbar spine.  IMPRESSION: No evidence of acute bowel obstruction or ileus. Resolution  of left pneumothorax postoperatively with small a moderate left pleural effusion present.   Electronically Signed   By: Aletta Edouard M.D.   On: 06/26/2014 14:05     EKG Interpretation None      MDM   Final diagnoses:  Cellulitis of elbow   Right elbow edema, pain and swelling for the past 2 days. No trauma. Status post CABG October 2.  Suspect septic olecranon bursitis discussed with Dr. Roxan Hockey who agrees unlikely from catheterization or surgery.  Labs, IV antibiotics, bursa aspiration.  D/w Dr. Ninfa Linden who saw patient and performed aspiration himself.  Clear, blood tinged fluid.  He agrees no evidence of septic elbow.  Admission to hospitalists for IV antibiotics.  AAS without obstruction. Abdomen soft.   Ezequiel Essex, MD 06/26/14 [phone]

## 2014-06-26 NOTE — ED Notes (Signed)
Report attempted x 1

## 2014-06-26 NOTE — Progress Notes (Signed)
ANTIBIOTIC CONSULT NOTE - INITIAL  Pharmacy Consult for Vancomycin Indication: cellulitis  No Known Allergies  Patient Measurements: Height: 5\' 8"  (172.7 cm) Weight: 198 lb (89.812 kg) IBW/kg (Calculated) : 68.4  Vital Signs: Temp: 98.1 F (36.7 C) (10/11 1157) Temp Source: Oral (10/11 1157) BP: 131/78 mmHg (10/11 1715) Pulse Rate: 88 (10/11 1715) Intake/Output from previous day:   Intake/Output from this shift:    Labs:  Recent Labs  06/26/14 1323  WBC 13.6*  HGB 11.3*  PLT 656*  CREATININE 0.90   Estimated Creatinine Clearance: 99.8 ml/min (by C-G formula based on Cr of 0.9). No results found for this basename: VANCOTROUGH, Corlis Leak, VANCORANDOM, [place], GENTPEAK, GENTRANDOM, TOBRATROUGH, TOBRAPEAK, TOBRARND, AMIKACINPEAK, AMIKACINTROU, AMIKACIN,  in the last 72 hours   Microbiology: Recent Results (from the past 720 hour(s))  MRSA PCR SCREENING     Status: None   Collection Time    06/16/14  8:55 PM      Result Value Ref Range Status   MRSA by PCR NEGATIVE  NEGATIVE Final   Comment:            The GeneXpert MRSA Assay (FDA     approved for NASAL specimens     only), is one component of a     comprehensive MRSA colonization     surveillance program. It is not     intended to diagnose MRSA     infection nor to guide or     monitor treatment for     MRSA infections.  GRAM STAIN     Status: None   Collection Time    06/26/14  3:36 PM      Result Value Ref Range Status   Specimen Description SYNOVIAL RIGHT FLUID   Final   Special Requests RIGHT ELBOW   Final   Gram Stain     Final   Value: FEW WBC PRESENT,BOTH PMN AND MONONUCLEAR     NO ORGANISMS SEEN   Report Status 06/26/2014 FINAL   Final    Medical History: Past Medical History  Diagnosis Date  . Hypertension   . Hyperlipemia   . Coronary artery disease     Medications:  See electronic med rec  Assessment: 58 y.o. male presents with R elbow pain/swelling for last 3 days. Pt received  Vancomycin 1gm in ED ~1500. S/p I&D in ED today. To continue vancomycin for R elbow cellulitis. Estimated CrCl ~100 ml/min.  Goal of Therapy:  Vancomycin trough level 10-15 mcg/ml  Plan:  1. Vancomycin 1500mg  IV q12h 2. Will f/u micro data, pt's clinical condition, renal function 3. Vanc trough prn  Sherlon Handing, PharmD, BCPS Clinical pharmacist, pager [phone] 06/26/2014,5:33 PM

## 2014-06-27 DIAGNOSIS — M109 Gout, unspecified: Secondary | ICD-10-CM | POA: Diagnosis present

## 2014-06-27 DIAGNOSIS — Z951 Presence of aortocoronary bypass graft: Secondary | ICD-10-CM

## 2014-06-27 LAB — CBC
HCT: 31 % — ABNORMAL LOW (ref 39.0–52.0)
Hemoglobin: 10.3 g/dL — ABNORMAL LOW (ref 13.0–17.0)
MCH: 29.1 pg (ref 26.0–34.0)
MCHC: 33.2 g/dL (ref 30.0–36.0)
MCV: 87.6 fL (ref 78.0–100.0)
PLATELETS: 600 10*3/uL — AB (ref 150–400)
RBC: 3.54 MIL/uL — ABNORMAL LOW (ref 4.22–5.81)
RDW: 13.3 % (ref 11.5–15.5)
WBC: 10.8 10*3/uL — ABNORMAL HIGH (ref 4.0–10.5)

## 2014-06-27 LAB — BASIC METABOLIC PANEL
Anion gap: 16 — ABNORMAL HIGH (ref 5–15)
BUN: 15 mg/dL (ref 6–23)
CO2: 21 mEq/L (ref 19–32)
Calcium: 8.9 mg/dL (ref 8.4–10.5)
Chloride: 102 mEq/L (ref 96–112)
Creatinine, Ser: 0.96 mg/dL (ref 0.50–1.35)
Glucose, Bld: 118 mg/dL — ABNORMAL HIGH (ref 70–99)
Potassium: 3.5 mEq/L — ABNORMAL LOW (ref 3.7–5.3)
SODIUM: 139 meq/L (ref 137–147)

## 2014-06-27 LAB — C-REACTIVE PROTEIN: CRP: 12.4 mg/dL — AB (ref <0.60)

## 2014-06-27 MED ORDER — IBUPROFEN 800 MG PO TABS: 800.0000 mg | ORAL_TABLET | Freq: Three times a day (TID) | ORAL | Status: DC | PRN

## 2014-06-27 NOTE — Discharge Summary (Signed)
Physician Discharge Summary  Eric Saunders SNK:[id number] DOB: Feb 08, 1956 DOA: 06/26/2014  PCP: Redge Gainer, MD  Admit date: 06/26/2014 Discharge date: 06/27/2014  Time spent: Less than 30 minutes  Recommendations for Outpatient Follow-up:  1. Dr. Jean Rosenthal, Orthopedics in 1 week. 2. Dr. Redge Gainer, PCP in one week with repeat labs (CBC, BMP & Uric Acid). 3. Dr. Arvid Right, [facility]  Discharge Diagnoses:  Principal Problem:   Acute gouty arthritis Active Problems:   Essential hypertension, benign   S/P CABG x 4   Constipation   Olecranon bursitis of right elbow   Right arm cellulitis   Discharge Condition: Improved & Stable  Diet recommendation: Heart healthy diet.  Filed Weights   06/26/14 1155 06/26/14 1750  Weight: 89.812 kg (198 lb) 86.5 kg (190 lb 11.2 oz)    History of present illness:  58 year old male with history of hypertension, hyperlipidemia, CAD with CABG x4 on 06/17/14 by Dr. Cyndia Bent, who was just discharged on 06/22/14 presented to ED with right elbow pain and swelling for last 3 days. Ibuprofen did help the pain. Patient called CT surgery office and was recommended to come to the urgent care or the ER. Orthopedics was consulted in the ER, patient was seen by Dr. Ninfa Linden who aspirated his right elbow and the fluid was sent for Gram stain and culture.  Hospital Course:   1. Acute gouty arthritis of right elbow and left index finger PIP joint: He was initially treated for presumed cellulitis of his right forearm and olecranon bursitis with empiric IV vancomycin. Orthopedics was consulted and performed diagnostic arthrocentesis. This confirmed diagnosis of gout without evidence of infection. He clinically improved within the next 24 hours. He was discharged on ibuprofen 800 mg 3 times a day scheduled for 2-3 days followed by 3 times a day when necessary. Recommend checking uric acid as outpatient and address as needed. Orthopedic followup in 1  week. 2. Essential hypertension: Controlled. 3. CAD status post CABG 06/17/14: Stable without complaints. Apparently has a followup appointment with surgeon as outpatient. Continues to smoke-cessation counseled. Small to moderate left pleural effusion on chest x-ray-likely reactive from recent surgery-stable-outpatient followup with [facility]. 4. Constipation: Patient had a BM on day of discharge. 5. Tobacco abuse: Cessation counseled. 6. Anemia: Stable. 7. Thrombocytosis: Likely reactive. Outpatient followup  Consultations:  Orthopedics  Procedures:  Arthrocentesis of right elbow.    Discharge Exam:  Complaints:  Feeling much better with improved pain in right elbow. Mild pain and swelling of left index finger. Asking to leave the floor to smoke. Had BM today and yesterday. Denies chest pain or dyspnea.  Filed Vitals:   06/26/14 1750 06/26/14 2100 06/27/14 0419 06/27/14 1300  BP: 150/79 131/73 118/69 101/58  Pulse: 90 87 82 77  Temp: 99.1 F (37.3 C) 97.9 F (36.6 C) 99.1 F (37.3 C) 98.8 F (37.1 C)  TempSrc: Oral Oral Oral Oral  Resp: '20 20 18 18  ' Height:      Weight: 86.5 kg (190 lb 11.2 oz)     SpO2: 100% 93% 93% 95%    General exam: Pleasant middle-aged male sitting at edge of bed comfortably. Respiratory system: Clear. No increased work of breathing. Sternotomy suture site clean and try. Cardiovascular system: S1 & S2 heard, RRR. No JVD, murmurs, gallops, clicks or pedal edema. Gastrointestinal system: Abdomen is nondistended, soft and nontender. Normal bowel sounds heard. Central nervous system: Alert and oriented. No focal neurological deficits. Extremities: Symmetric 5 x 5 power. Mild swelling  and tenderness around her right elbow joint without redness, crepitus. Mildly restricted and painful range of movements. Mild swelling and tenderness of left index finger PIP joint.  Discharge Instructions      Discharge Instructions   Call MD for:  redness, tenderness, or  signs of infection (pain, swelling, redness, odor or green/yellow discharge around incision site)    Complete by:  As directed      Call MD for:  severe uncontrolled pain    Complete by:  As directed      Call MD for:  temperature >100.4    Complete by:  As directed      Diet - low sodium heart healthy    Complete by:  As directed      Increase activity slowly    Complete by:  As directed             Medication List         ALPRAZolam 0.5 MG tablet  Commonly known as:  XANAX  Take 0.5 mg by mouth 2 (two) times daily as needed for anxiety.     aspirin 325 MG EC tablet  Take 1 tablet (325 mg total) by mouth daily.     ezetimibe 10 MG tablet  Commonly known as:  ZETIA  Take 10 mg by mouth daily.     fenofibrate 160 MG tablet  Take 160 mg by mouth daily.     ibuprofen 800 MG tablet  Commonly known as:  ADVIL,MOTRIN  Take 1 tablet (800 mg total) by mouth every 8 (eight) hours as needed for moderate pain. Take 3 times daily regularly for 2-3 days, then can take as needed.     lisinopril 10 MG tablet  Commonly known as:  PRINIVIL,ZESTRIL  Take 1 tablet (10 mg total) by mouth daily.     metoprolol tartrate 25 MG tablet  Commonly known as:  LOPRESSOR  Take 12.5 mg by mouth 2 (two) times daily.     omeprazole 20 MG capsule  Commonly known as:  PRILOSEC  Take 20 mg by mouth daily as needed (Acid reflux).     simvastatin 40 MG tablet  Commonly known as:  ZOCOR  Take 40 mg by mouth daily.       Follow-up Information   Follow up with Mcarthur Rossetti, MD. Schedule an appointment as soon as possible for a visit in 1 week.   Specialty:  Orthopedic Surgery   Contact information:   [place] [address] [phone]       Follow up with Redge Gainer, MD. Schedule an appointment as soon as possible for a visit in 1 week. (To be seen with repeat labs (CBC & BMP).)    Specialty:  Family Medicine   Contact information:   [place] [address] [phone]       Schedule an appointment as soon as possible for a visit with Gaye Pollack, MD.   Specialty:  Cardiothoracic Surgery   Contact information:   [address] [place] [address] [phone]        The results of significant diagnostics from this hospitalization (including imaging, microbiology, ancillary and laboratory) are listed below for reference.    Significant Diagnostic Studies:  Dg Elbow Complete Right  06/26/2014   CLINICAL DATA:  58 year old male with acute onset right elbow joint swelling. One to the touch. Initial encounter. Recent CABG.  EXAM: RIGHT ELBOW - COMPLETE 3+ VIEW  COMPARISON:  None.  FINDINGS: Widespread soft tissue swelling about the right elbow joint, more so medially. Associated subcutaneous stranding. This continues into the visible forearm. At the same time, no definite joint effusion. Advanced degenerative changes at the olecranon, with chronic fragmentation and osteophytosis. Alignment preserved. No acute fracture identified.  IMPRESSION: Severe soft tissue swelling at the right elbow tracking into the forearm. No underlying acute osseous abnormality, but advanced degenerative changes about the olecranon.   Electronically Signed   By: Lars Pinks M.D.   On: 06/26/2014 13:32   Dg Abd Acute W/chest  06/26/2014   CLINICAL DATA:  Status post recent CABG on 06/17/2014. Cough, congestion, low-grade fever, constipation and abdominal distention.  EXAM: ACUTE ABDOMEN SERIES (ABDOMEN 2 VIEW & CHEST 1 VIEW)  COMPARISON:  06/21/2014  FINDINGS: Resolution of left pneumothorax. There is a small to moderate left pleural effusion with associated left basilar atelectasis. The heart size is normal. No pulmonary edema identified.  Abdominal films show no evidence of bowel obstruction or ileus. No significant retained fecal material. No abnormal calcifications. Mild degenerative changes are present throughout the lumbar spine.   IMPRESSION: No evidence of acute bowel obstruction or ileus. Resolution of left pneumothorax postoperatively with small a moderate left pleural effusion present.   Electronically Signed   By: Aletta Edouard M.D.   On: 06/26/2014 14:05    Microbiology: Recent Results (from the past 240 hour(s))  CULTURE, BLOOD (ROUTINE X 2)     Status: None   Collection Time    06/26/14  1:15 PM      Result Value Ref Range Status   Specimen Description BLOOD LEFT ARM   Final   Special Requests     Final   Value: BOTTLES DRAWN AEROBIC AND ANAEROBIC BLUE 10 CC RED 5 CC   Culture  Setup Time     Final   Value: 06/26/2014 18:44     Performed at [facility]   Culture     Final   Value:        BLOOD CULTURE RECEIVED NO GROWTH TO DATE CULTURE WILL BE HELD FOR 5 DAYS BEFORE ISSUING A FINAL NEGATIVE REPORT     Performed at [facility]   Report Status PENDING   Incomplete  CULTURE, BLOOD (ROUTINE X 2)     Status: None   Collection Time    06/26/14  1:20 PM      Result Value Ref Range Status   Specimen Description BLOOD LEFT HAND   Final   Special Requests BOTTLES DRAWN AEROBIC ONLY 5 CC   Final   Culture  Setup Time     Final   Value: 06/26/2014 18:44     Performed at [facility]   Culture     Final   Value:        BLOOD CULTURE RECEIVED NO GROWTH TO DATE CULTURE WILL BE HELD FOR 5 DAYS BEFORE ISSUING A FINAL NEGATIVE REPORT     Performed at [facility]   Report Status PENDING   Incomplete  GRAM STAIN     Status: None   Collection Time    06/26/14  3:36 PM      Result Value Ref Range Status   Specimen Description SYNOVIAL RIGHT FLUID   Final   Special Requests RIGHT ELBOW   Final   Gram Stain     Final   Value: FEW WBC PRESENT,BOTH PMN AND MONONUCLEAR     NO ORGANISMS SEEN  Report Status 06/26/2014 FINAL   Final  BODY FLUID CULTURE     Status: None   Collection Time    06/26/14  3:36 PM      Result Value Ref Range Status   Specimen Description SYNOVIAL RIGHT  FLUID   Final   Special Requests RIGHT ELBOW   Final   Gram Stain     Final   Value: FEW WBC PRESENT,BOTH PMN AND MONONUCLEAR     NO ORGANISMS SEEN     Performed at [facility]     Performed at [facility]   Culture     Final   Value: NO GROWTH 1 DAY     Performed at [facility]   Report Status PENDING   Incomplete     Labs: Basic Metabolic Panel:  Recent Labs Lab 06/21/14 0259 06/26/14 1323 06/27/14 0630  NA 141 136* 139  K 3.9 3.8 3.5*  CL 100 99 102  CO2 '28 21 21  ' GLUCOSE 101* 89 118*  BUN '17 15 15  ' CREATININE 0.89 0.90 0.96  CALCIUM 8.6 9.5 8.9   Liver Function Tests: No results found for this basename: AST, ALT, ALKPHOS, BILITOT, PROT, ALBUMIN,  in the last 168 hours No results found for this basename: LIPASE, AMYLASE,  in the last 168 hours No results found for this basename: AMMONIA,  in the last 168 hours CBC:  Recent Labs Lab 06/21/14 0259 06/26/14 1323 06/27/14 0630  WBC 8.4 13.6* 10.8*  NEUTROABS  --  10.3*  --   HGB 9.4* 11.3* 10.3*  HCT 28.5* 33.6* 31.0*  MCV 87.4 86.2 87.6  PLT 318 656* 600*   Cardiac Enzymes: No results found for this basename: CKTOTAL, CKMB, CKMBINDEX, TROPONINI,  in the last 168 hours BNP: BNP (last 3 results) No results found for this basename: PROBNP,  in the last 8760 hours CBG: No results found for this basename: GLUCAP,  in the last 168 hours  Additional labs: 1. CRP: 12.4, ESR: 85 2. Right elbow synovial fluid: Appearance turbid, intra-and extracellular monosodium urate crystals. Final synovial fluid culture: Negative. 3. Blood culture x2: No growth final report.   Signed:  Vernell Leep, MD, FACP, FHM. [facility] Pager [phone]  If 7PM-7AM, please contact night-coverage [email or web address] Password [email or web address] 06/27/2014, 1:13 PM

## 2014-06-27 NOTE — Progress Notes (Signed)
Patient ID: Eric Saunders, male   DOB: 1956/04/12, 58 y.o.   MRN: [id number] Right elbow feeling better, but now getting some pain in his left index finger.  Most likely an inflammatory process.  No evidence of infection on gram stain, but extracellular crystals consistent with gout.  Would discharge on NSAIDs such as 800 mg motrin bid to tid for 2-3 days then prn.  Will need outpatient follow-up with PCP for gout treatment.

## 2014-06-30 ENCOUNTER — Other Ambulatory Visit: Payer: Self-pay | Admitting: *Deleted

## 2014-06-30 ENCOUNTER — Ambulatory Visit (INDEPENDENT_AMBULATORY_CARE_PROVIDER_SITE_OTHER): Payer: Self-pay | Admitting: *Deleted

## 2014-06-30 VITALS — BP 169/91

## 2014-06-30 DIAGNOSIS — I10 Essential (primary) hypertension: Secondary | ICD-10-CM

## 2014-06-30 DIAGNOSIS — Z4802 Encounter for removal of sutures: Secondary | ICD-10-CM

## 2014-06-30 DIAGNOSIS — I2510 Atherosclerotic heart disease of native coronary artery without angina pectoris: Secondary | ICD-10-CM

## 2014-06-30 DIAGNOSIS — Z951 Presence of aortocoronary bypass graft: Secondary | ICD-10-CM

## 2014-06-30 LAB — BODY FLUID CULTURE: Culture: NO GROWTH

## 2014-06-30 NOTE — Progress Notes (Signed)
Mr. Guillette had CABG X 4 on 06/17/14 and returns for the removal of four sutures from his previous pleural chest tube sites, which was easily done. All of his operative sites are healing very well...sternal, right leg endo vein harvest and chest tube incisions.  He is doing well at home.Marland Kitchen...walking, constipation issues being resolved with stool softeners/laxatives.  He had to be readmitted for gout to his right elbow and left index finger. Some swelling remains. I reviewed his weight limitations and no driving restrictions with him.  He is not used to this much non activity and wants to use hand weights, do a little light yard work, [employer] research officer, government. He will return as scheduled with a chest xray.  Also, His recent discharge says he is supposed to be taking Zetia.  We didn't presrcibe this med on discharge after CABG.  I told him to discuss this with Dr. Percival Spanish at his scheduled visit.

## 2014-07-02 LAB — CULTURE, BLOOD (ROUTINE X 2)
CULTURE: NO GROWTH
Culture: NO GROWTH

## 2014-07-04 ENCOUNTER — Telehealth: Payer: Self-pay | Admitting: Nurse Practitioner

## 2014-07-04 NOTE — Telephone Encounter (Signed)
appt made for Wed with MMM

## 2014-07-06 ENCOUNTER — Other Ambulatory Visit: Payer: Self-pay | Admitting: Nurse Practitioner

## 2014-07-06 ENCOUNTER — Encounter: Payer: Self-pay | Admitting: Nurse Practitioner

## 2014-07-06 ENCOUNTER — Ambulatory Visit (INDEPENDENT_AMBULATORY_CARE_PROVIDER_SITE_OTHER): Payer: BC Managed Care – PPO | Admitting: Nurse Practitioner

## 2014-07-06 VITALS — BP 142/90 | HR 72 | Temp 97.9°F | Ht 68.0 in | Wt 193.0 lb

## 2014-07-06 DIAGNOSIS — I10 Essential (primary) hypertension: Secondary | ICD-10-CM

## 2014-07-06 DIAGNOSIS — Z951 Presence of aortocoronary bypass graft: Secondary | ICD-10-CM

## 2014-07-06 DIAGNOSIS — F411 Generalized anxiety disorder: Secondary | ICD-10-CM

## 2014-07-06 DIAGNOSIS — L03113 Cellulitis of right upper limb: Secondary | ICD-10-CM

## 2014-07-06 DIAGNOSIS — E785 Hyperlipidemia, unspecified: Secondary | ICD-10-CM

## 2014-07-06 DIAGNOSIS — Z09 Encounter for follow-up examination after completed treatment for conditions other than malignant neoplasm: Secondary | ICD-10-CM

## 2014-07-06 MED ORDER — SIMVASTATIN 40 MG PO TABS: 40.0000 mg | ORAL_TABLET | Freq: Every day | ORAL | Status: DC

## 2014-07-06 MED ORDER — ALPRAZOLAM 0.5 MG PO TABS: 0.5000 mg | ORAL_TABLET | Freq: Two times a day (BID) | ORAL | Status: DC | PRN

## 2014-07-06 MED ORDER — IBUPROFEN 800 MG PO TABS: 800.0000 mg | ORAL_TABLET | Freq: Three times a day (TID) | ORAL | Status: DC | PRN

## 2014-07-06 MED ORDER — FENOFIBRATE 160 MG PO TABS: 160.0000 mg | ORAL_TABLET | Freq: Every day | ORAL | Status: DC

## 2014-07-06 NOTE — Progress Notes (Signed)
Subjective:    Patient ID: Eric Saunders, male    DOB: Mar 06, 1956, 58 y.o.   MRN: [id number]  HPI Patient here today for hospital follow up- He had 4 vessel bypass on June 17, 2014 and dd quite well- after coming home he developed Gout in his right elbow with celluilitis and had to go back to the hospital for that- He is doing well now- Still sore and tired but other wise okay.  Patient Active Problem List   Diagnosis Date Noted  . Acute gouty arthritis 06/27/2014  . Constipation 06/26/2014  . Olecranon bursitis of right elbow 06/26/2014  . Right arm cellulitis 06/26/2014  . S/P CABG x 4 06/17/2014  . Unstable angina 06/16/2014  . Abnormal EKG 01/25/2014  . Essential hypertension, benign 12/28/2012  . Hyperlipidemia LDL goal < 100 12/28/2012  . GAD (generalized anxiety disorder) 12/28/2012   Outpatient Encounter Prescriptions as of 07/06/2014  Medication Sig  . ALPRAZolam (XANAX) 0.5 MG tablet Take 0.5 mg by mouth 2 (two) times daily as needed for anxiety.  Marland Kitchen aspirin EC 325 MG EC tablet Take 1 tablet (325 mg total) by mouth daily.  Marland Kitchen ezetimibe (ZETIA) 10 MG tablet Take 10 mg by mouth daily.  . fenofibrate 160 MG tablet Take 160 mg by mouth daily.  Marland Kitchen ibuprofen (ADVIL,MOTRIN) 800 MG tablet Take 1 tablet (800 mg total) by mouth every 8 (eight) hours as needed for moderate pain. Take 3 times daily regularly for 2-3 days, then can take as needed.  Marland Kitchen lisinopril (PRINIVIL,ZESTRIL) 10 MG tablet Take 1 tablet (10 mg total) by mouth daily.  . metoprolol tartrate (LOPRESSOR) 25 MG tablet Take 12.5 mg by mouth 2 (two) times daily.  Marland Kitchen omeprazole (PRILOSEC) 20 MG capsule Take 20 mg by mouth daily as needed (Acid reflux).  . simvastatin (ZOCOR) 40 MG tablet Take 40 mg by mouth daily.       Review of Systems  Constitutional: Negative.   HENT: Negative.   Respiratory: Positive for shortness of breath.   Cardiovascular: Negative.   Gastrointestinal: Negative.   Genitourinary: Negative.     Neurological: Negative.   Psychiatric/Behavioral: Negative.   All other systems reviewed and are negative.      Objective:   Physical Exam  Constitutional: He is oriented to person, place, and time. He appears well-developed and well-nourished.  HENT:  Head: Normocephalic.  Right Ear: External ear normal.  Left Ear: External ear normal.  Nose: Nose normal.  Mouth/Throat: Oropharynx is clear and moist.  Eyes: EOM are normal. Pupils are equal, round, and reactive to light.  Neck: Normal range of motion. Neck supple. No JVD present. No thyromegaly present.  Cardiovascular: Normal rate, regular rhythm, normal heart sounds and intact distal pulses.  Exam reveals no gallop and no friction rub.   No murmur heard. Pulmonary/Chest: Effort normal and breath sounds normal. No respiratory distress. He has no wheezes. He has no rales. He exhibits no tenderness.  Abdominal: Soft. Bowel sounds are normal. He exhibits no mass. There is no tenderness.  Genitourinary: Prostate normal and penis normal.  Musculoskeletal: Normal range of motion. He exhibits no edema.  Right elbow still erythematous and edematous.  Lymphadenopathy:    He has no cervical adenopathy.  Neurological: He is alert and oriented to person, place, and time. No cranial nerve deficit.  Skin: Skin is warm and dry.  Psychiatric: He has a normal mood and affect. His behavior is normal. Judgment and thought content normal.  BP 142/90  Pulse 72  Temp(Src) 97.9 F (36.6 C) (Oral)  Ht '5\' 8"'  (1.727 m)  Wt 193 lb (87.544 kg)  BMI 29.35 kg/m2         Assessment & Plan:  1. Hospital discharge follow-up Hospital records reviewed  2. S/P CABG x 4 Keep follow up appointment with cardiologist - CBC With differential/Platelet  3. Hyperlipidemia with target LDL less than 100 Low fat diet - simvastatin (ZOCOR) 40 MG tablet; Take 1 tablet (40 mg total) by mouth daily.  Dispense: 30 tablet; Refill: 5 - fenofibrate 160 MG tablet;  Take 1 tablet (160 mg total) by mouth daily.  Dispense: 30 tablet; Refill: 5 - NMR, lipoprofile  4. GAD (generalized anxiety disorder) Stress management - ALPRAZolam (XANAX) 0.5 MG tablet; Take 1 tablet (0.5 mg total) by mouth 2 (two) times daily as needed for anxiety.  Dispense: 60 tablet; Refill: 2  5. Essential hypertension, benign Low NA diet - CMP14+EGFR  6. Right arm cellulitis If not clearing up RTO - ibuprofen (ADVIL,MOTRIN) 800 MG tablet; Take 1 tablet (800 mg total) by mouth every 8 (eight) hours as needed for moderate pain. Take 3 times daily regularly for 2-3 days, then can take as needed.  Dispense: 30 tablet; Refill: 3 - Uric acid   Refuses flu vaccine Labs pending Health maintenance reviewed Diet and exercise encouraged Continue all meds Follow up  In 3 month   [place], FNP

## 2014-07-06 NOTE — Patient Instructions (Signed)

## 2014-07-07 LAB — NMR, LIPOPROFILE
CHOLESTEROL: 162 mg/dL (ref 100–199)
HDL CHOLESTEROL BY NMR: 36 mg/dL — AB (ref >39)
HDL Particle Number: 35.4 umol/L (ref >=30.5)
LDL Particle Number: 1137 nmol/L — ABNORMAL HIGH (ref <1000)
LDL Size: 20.5 nm (ref >20.5)
LDLC SERPL CALC-MCNC: 90 mg/dL (ref 0–99)
LP-IR Score: 64 — ABNORMAL HIGH (ref <=45)
Small LDL Particle Number: 634 nmol/L — ABNORMAL HIGH (ref <=527)
Triglycerides by NMR: 178 mg/dL — ABNORMAL HIGH (ref 0–149)

## 2014-07-07 LAB — LIPID PANEL WITH LDL/HDL RATIO
Cholesterol, Total: 163 mg/dL (ref 100–199)
HDL: 38 mg/dL — AB (ref >39)
LDL Calculated: 88 mg/dL (ref 0–99)
LDL/HDL RATIO: 2.3 ratio (ref 0.0–3.6)
TRIGLYCERIDES: 186 mg/dL — AB (ref 0–149)
VLDL CHOLESTEROL CAL: 37 mg/dL (ref 5–40)

## 2014-07-07 LAB — SPECIMEN STATUS REPORT

## 2014-07-08 LAB — CBC WITH DIFFERENTIAL
BASOS: 0 %
Basophils Absolute: 0 10*3/uL (ref 0.0–0.2)
EOS: 3 %
Eosinophils Absolute: 0.3 10*3/uL (ref 0.0–0.4)
HEMATOCRIT: 35 % — AB (ref 37.5–51.0)
HEMOGLOBIN: 11.7 g/dL — AB (ref 12.6–17.7)
Immature Grans (Abs): 0 10*3/uL (ref 0.0–0.1)
Immature Granulocytes: 0 %
LYMPHS ABS: 3.1 10*3/uL (ref 0.7–3.1)
Lymphs: 28 %
MCH: 28.3 pg (ref 26.6–33.0)
MCHC: 33.4 g/dL (ref 31.5–35.7)
MCV: 85 fL (ref 79–97)
Monocytes Absolute: 0.6 10*3/uL (ref 0.1–0.9)
Monocytes: 5 %
Neutrophils Absolute: 6.8 10*3/uL (ref 1.4–7.0)
Neutrophils Relative %: 64 %
PLATELETS: 691 10*3/uL — AB (ref 150–379)
RBC: 4.13 x10E6/uL — AB (ref 4.14–5.80)
RDW: 13.8 % (ref 12.3–15.4)
WBC: 10.9 10*3/uL — ABNORMAL HIGH (ref 3.4–10.8)

## 2014-07-08 LAB — CMP14+EGFR
A/G RATIO: 1.4 (ref 1.1–2.5)
ALT: 12 IU/L (ref 0–44)
AST: 10 IU/L (ref 0–40)
Albumin: 4.2 g/dL (ref 3.5–5.5)
Alkaline Phosphatase: 68 IU/L (ref 39–117)
BUN/Creatinine Ratio: 20 (ref 9–20)
BUN: 18 mg/dL (ref 6–24)
CALCIUM: 9.7 mg/dL (ref 8.7–10.2)
CO2: 23 mmol/L (ref 18–29)
Chloride: 99 mmol/L (ref 97–108)
Creatinine, Ser: 0.91 mg/dL (ref 0.76–1.27)
GFR calc Af Amer: 109 mL/min/{1.73_m2} (ref >59)
GFR, EST NON AFRICAN AMERICAN: 94 mL/min/{1.73_m2} (ref >59)
Globulin, Total: 2.9 g/dL (ref 1.5–4.5)
Glucose: 82 mg/dL (ref 65–99)
POTASSIUM: 4.3 mmol/L (ref 3.5–5.2)
Sodium: 140 mmol/L (ref 134–144)
Total Bilirubin: 0.2 mg/dL (ref 0.0–1.2)
Total Protein: 7.1 g/dL (ref 6.0–8.5)

## 2014-07-08 LAB — URIC ACID: Uric Acid: 5.8 mg/dL (ref 3.7–8.6)

## 2014-07-11 NOTE — Telephone Encounter (Signed)
He was  On one when he was  In the hospital with his elbow- if gets red again let me know.

## 2014-07-11 NOTE — Telephone Encounter (Signed)
Patient aware of lab results but he says he has not been on an antibiotic.

## 2014-07-11 NOTE — Telephone Encounter (Signed)
Message copied by Shelbie Ammons on Mon Jul 11, 2014 11:05 AM ------      Message from: [name]      Created: Thu Jul 07, 2014  6:15 PM       Kidney and liver function stable      Cbc unchanged- finish antibiotic      Uric acid level normal      Continue current meds- low fat diet and exercise and recheck in 3 months             ------

## 2014-07-11 NOTE — Telephone Encounter (Signed)
Patient aware.

## 2014-07-19 ENCOUNTER — Ambulatory Visit (INDEPENDENT_AMBULATORY_CARE_PROVIDER_SITE_OTHER): Payer: BC Managed Care – PPO | Admitting: Cardiology

## 2014-07-19 ENCOUNTER — Encounter: Payer: Self-pay | Admitting: Cardiology

## 2014-07-19 VITALS — BP 178/101 | HR 73 | Ht 68.0 in | Wt 192.0 lb

## 2014-07-19 DIAGNOSIS — Z951 Presence of aortocoronary bypass graft: Secondary | ICD-10-CM

## 2014-07-19 DIAGNOSIS — I10 Essential (primary) hypertension: Secondary | ICD-10-CM

## 2014-07-19 DIAGNOSIS — E785 Hyperlipidemia, unspecified: Secondary | ICD-10-CM

## 2014-07-19 MED ORDER — LISINOPRIL 10 MG PO TABS: 10.0000 mg | ORAL_TABLET | Freq: Two times a day (BID) | ORAL | Status: DC

## 2014-07-19 MED ORDER — METOPROLOL TARTRATE 25 MG PO TABS: 25.0000 mg | ORAL_TABLET | Freq: Two times a day (BID) | ORAL | Status: DC

## 2014-07-19 MED ORDER — FENOFIBRATE 160 MG PO TABS: 160.0000 mg | ORAL_TABLET | Freq: Every day | ORAL | Status: DC

## 2014-07-19 MED ORDER — SIMVASTATIN 40 MG PO TABS: 40.0000 mg | ORAL_TABLET | Freq: Every day | ORAL | Status: DC

## 2014-07-19 NOTE — Progress Notes (Signed)
HPI The patient returns for  followup after CABG. He says he is doing well. He does have some sternal discomfort which is in the right upper sternum and superficial but he's having no substernal discomfort.   He's not having any new shortness of breath, PND or orthopnea. He's not having new palpitations, presyncope or syncope. He is doing a little bit of walking around his house. He has some mild right lower extremity swelling. He has had no cough fevers or chills.  No Known Allergies  Current Outpatient Prescriptions  Medication Sig Dispense Refill  . ALPRAZolam (XANAX) 0.5 MG tablet Take 1 tablet (0.5 mg total) by mouth 2 (two) times daily as needed for anxiety. 60 tablet 2  . aspirin EC 325 MG EC tablet Take 1 tablet (325 mg total) by mouth daily.    . fenofibrate 160 MG tablet Take 1 tablet (160 mg total) by mouth daily. 30 tablet 5  . ibuprofen (ADVIL,MOTRIN) 800 MG tablet Take 1 tablet (800 mg total) by mouth every 8 (eight) hours as needed for moderate pain. Take 3 times daily regularly for 2-3 days, then can take as needed. 30 tablet 3  . lisinopril (PRINIVIL,ZESTRIL) 10 MG tablet Take 1 tablet (10 mg total) by mouth daily. 30 tablet 1  . metoprolol tartrate (LOPRESSOR) 25 MG tablet Take 12.5 mg by mouth 2 (two) times daily.    Marland Kitchen omeprazole (PRILOSEC) 20 MG capsule Take 20 mg by mouth daily as needed (Acid reflux).    . simvastatin (ZOCOR) 40 MG tablet Take 1 tablet (40 mg total) by mouth daily. 30 tablet 5   No current facility-administered medications for this visit.    Past Medical History  Diagnosis Date  . Hypertension   . Hyperlipemia   . Coronary artery disease     Past Surgical History  Procedure Laterality Date  . Knee surgery      bilateral arthroscopic  . Coronary artery bypass graft N/A 06/17/2014    Procedure: CORONARY ARTERY BYPASS GRAFTING (CABG) X 4, RIGHT LEG EVH;  Surgeon: Gaye Pollack, MD;  Location: [facility];  Service: Open Heart Surgery;  Laterality: N/A;   . Tee without cardioversion N/A 06/17/2014    Procedure: TRANSESOPHAGEAL ECHOCARDIOGRAM (TEE);  Surgeon: Gaye Pollack, MD;  Location: [facility];  Service: Open Heart Surgery;  Laterality: N/A;    ROS:  Positive for reflux.  Otherwise as stated in the HPI and negative for all other systems.  PHYSICAL EXAM BP 178/101 mmHg  Pulse 73  Ht 5\' 8"  (1.727 m)  Wt 192 lb (87.091 kg)  BMI 29.20 kg/m2 GENERAL:  Well appearing HEENT:  Pupils equal round and reactive, fundi not visualized, oral mucosa unremarkable NECK:  No jugular venous distention, waveform within normal limits, carotid upstroke brisk and symmetric, no bruits, no thyromegaly LYMPHATICS:  No cervical, inguinal adenopathy LUNGS:  Clear to auscultation bilaterally BACK:  No CVA tenderness CHEST:  Well healed sternotomy scar. HEART:  PMI not displaced or sustained,S1 and S2 within normal limits, no S3, no S4, no clicks, no rubs, no  murmurs ABD:  Flat, positive bowel sounds normal in frequency in pitch, no bruits, no rebound, no guarding, no midline pulsatile mass, no hepatomegaly, no splenomegaly EXT:  2 plus pulses throughout, mild right ankle edema, no cyanosis no clubbing SKIN:  No rashes no nodules NEURO:  Cranial nerves II through XII grossly intact, motor grossly intact throughout PSYCH:  Cognitively intact, oriented to person place and time  ASSESSMENT AND PLAN  CAD/CABG:   The patient is now status post CABG.    He has some mild sternal pain. I will refer him to cardiac rehabilitation. We will continue with aggressive risk reduction.  TOBACCO:  We discussed a specific strategy for tobacco cessation  completely.   DYSLIPIDEMIA:   I reviewed his lipid profile. He will remain on the meds as listed.  HTN:   His blood pressure is elevated. I will increase his lisinopril 10 mg twice a day and his metoprolol 25 mg twice a day.

## 2014-07-19 NOTE — Patient Instructions (Signed)
Your physician recommends that you schedule a follow-up appointment in: 3 MONTHS WITH DR Crane METOPROLOL TO 25 MG TWICE DAILY  INCREASE LISINOPRIL TO 10 MG TWICE DAILY  REFERRAL TO CARDIAC REHAB

## 2014-07-20 ENCOUNTER — Emergency Department (HOSPITAL_COMMUNITY): Payer: BC Managed Care – PPO

## 2014-07-20 ENCOUNTER — Emergency Department (HOSPITAL_COMMUNITY)
Admission: EM | Admit: 2014-07-20 | Discharge: 2014-07-20 | Disposition: A | Discharge status: Home / Self Care | Payer: BC Managed Care – PPO | Attending: Emergency Medicine | Admitting: Emergency Medicine

## 2014-07-20 ENCOUNTER — Encounter (HOSPITAL_COMMUNITY): Payer: Self-pay | Admitting: Emergency Medicine

## 2014-07-20 DIAGNOSIS — Z951 Presence of aortocoronary bypass graft: Secondary | ICD-10-CM | POA: Diagnosis not present

## 2014-07-20 DIAGNOSIS — I2510 Atherosclerotic heart disease of native coronary artery without angina pectoris: Secondary | ICD-10-CM | POA: Insufficient documentation

## 2014-07-20 DIAGNOSIS — I10 Essential (primary) hypertension: Secondary | ICD-10-CM | POA: Insufficient documentation

## 2014-07-20 DIAGNOSIS — Z79899 Other long term (current) drug therapy: Secondary | ICD-10-CM | POA: Diagnosis not present

## 2014-07-20 DIAGNOSIS — Z7982 Long term (current) use of aspirin: Secondary | ICD-10-CM | POA: Diagnosis not present

## 2014-07-20 DIAGNOSIS — E785 Hyperlipidemia, unspecified: Secondary | ICD-10-CM | POA: Diagnosis not present

## 2014-07-20 DIAGNOSIS — R0789 Other chest pain: Secondary | ICD-10-CM | POA: Insufficient documentation

## 2014-07-20 DIAGNOSIS — Z720 Tobacco use: Secondary | ICD-10-CM | POA: Insufficient documentation

## 2014-07-20 DIAGNOSIS — R079 Chest pain, unspecified: Secondary | ICD-10-CM | POA: Diagnosis present

## 2014-07-20 LAB — BASIC METABOLIC PANEL
Anion gap: 14 (ref 5–15)
BUN: 15 mg/dL (ref 6–23)
CALCIUM: 9.1 mg/dL (ref 8.4–10.5)
CO2: 24 mEq/L (ref 19–32)
Chloride: 104 mEq/L (ref 96–112)
Creatinine, Ser: 1.01 mg/dL (ref 0.50–1.35)
GFR calc non Af Amer: 81 mL/min — ABNORMAL LOW (ref >90)
Glucose, Bld: 97 mg/dL (ref 70–99)
POTASSIUM: 4.1 meq/L (ref 3.7–5.3)
SODIUM: 142 meq/L (ref 137–147)

## 2014-07-20 LAB — CBC
HCT: 37.2 % — ABNORMAL LOW (ref 39.0–52.0)
Hemoglobin: 12.3 g/dL — ABNORMAL LOW (ref 13.0–17.0)
MCH: 28.5 pg (ref 26.0–34.0)
MCHC: 33.1 g/dL (ref 30.0–36.0)
MCV: 86.3 fL (ref 78.0–100.0)
Platelets: 355 10*3/uL (ref 150–400)
RBC: 4.31 MIL/uL (ref 4.22–5.81)
RDW: 13.8 % (ref 11.5–15.5)
WBC: 10.3 10*3/uL (ref 4.0–10.5)

## 2014-07-20 LAB — I-STAT TROPONIN, ED: TROPONIN I, POC: 0.02 ng/mL (ref 0.00–0.08)

## 2014-07-20 LAB — TROPONIN I

## 2014-07-20 MED ORDER — NITROGLYCERIN 0.4 MG SL SUBL: 0.4000 mg | SUBLINGUAL_TABLET | SUBLINGUAL | Status: DC | PRN

## 2014-07-20 NOTE — Discharge Instructions (Signed)
Chest Pain (Nonspecific) Mr. Eric Saunders, you were seen today for pain under left side. Your heart was evaluated and your blood work was negative as well as yourEKG. This might be pain from your surgery or pain from the fluid around your lung which is resolving based on your chest x-ray. You can take Motrin at home as needed for pain and follow-up with your cardiologist within 3 days for continued treatment. If any of your symptoms worsen come back to emergency department for repeat evaluation. Thank you. It is often hard to give a diagnosis for the cause of chest pain. There is always a chance that your pain could be related to something serious, such as a heart attack or a blood clot in the lungs. You need to follow up with your doctor. HOME CARE  If antibiotic medicine was given, take it as directed by your doctor. Finish the medicine even if you start to feel better.  For the next few days, avoid activities that bring on chest pain. Continue physical activities as told by your doctor.  Do not use any tobacco products. This includes cigarettes, chewing tobacco, and e-cigarettes.  Avoid drinking alcohol.  Only take medicine as told by your doctor.  Follow your doctor's suggestions for more testing if your chest pain does not go away.  Keep all doctor visits you made. GET HELP IF:  Your chest pain does not go away, even after treatment.  You have a rash with blisters on your chest.  You have a fever. GET HELP RIGHT AWAY IF:   You have more pain or pain that spreads to your arm, neck, jaw, back, or belly (abdomen).  You have shortness of breath.  You cough more than usual or cough up blood.  You have very bad back or belly pain.  You feel sick to your stomach (nauseous) or throw up (vomit).  You have very bad weakness.  You pass out (faint).  You have chills. This is an emergency. Do not wait to see if the problems will go away. Call your local emergency services (911 in [place]  Do not drive yourself to the hospital. MAKE SURE YOU:   Understand these instructions.  Will watch your condition.  Will get help right away if you are not doing well or get worse. Document Released: 02/19/2008 Document Revised: 09/07/2013 Document Reviewed: 02/19/2008 [facility] Patient Information 2015 [place], [place]. This information is not intended to replace advice given to you by your health care provider. Make sure you discuss any questions you have with your health care provider.

## 2014-07-20 NOTE — ED Notes (Addendum)
Per EMS: States called to patient's house by pt's wife. Patient was having chest pain with BP of 172/118 and anxiety. State nitro was offered but patient refused. Patient took 324 mg of aspirin at home.  BP came down to 176/88 on route to ED. HR 90 RR 16 NSR SpO2 95%  Per patient:  Patient states that he has been having left lower chest pain for past 2 weeks. He went to PCP Tuesday and was told to double up on his BP medication. After doubling up on BP medication tonight he woke up with pain in lower left lung. State he could hardly breath.  Also patient is adamant that it is not chest pain but pain in left side of chest that is the lung.

## 2014-07-20 NOTE — ED Notes (Signed)
Patient is resting comfortably. 

## 2014-07-20 NOTE — ED Notes (Signed)
Family at bedside. 

## 2014-07-20 NOTE — ED Provider Notes (Signed)
CSN: [id number]     Arrival date & time 07/20/14  0213 History   First MD Initiated Contact with Patient 07/20/14 0239     Chief Complaint  Patient presents with  . Chest Pain     (Consider location/radiation/quality/duration/timing/severity/associated sxs/prior Treatment) HPI BRAUN ROCCA is a 58 y.o. male with a past medical history of hypertension, hyperlipidemia, coronary artery disease status post CABG presenting with left-sided chest pain. Patient describes his pain as long the pain being right under his left lower ribs. He's had this pain since his CABG and it normally occurs when he lays down at night. He states it usually goes away, but this time it lasted 20-30 minutes which concerned him. The pain is a stabbing sensation without radiation. It was associated with shortness of breath. At that time he woke up took a Xanax and smokeless cigarette. This did not relieve his symptoms that he presented to the emergency department. His blood pressure at the time was 536 systolic and he did feel some fatigue in his left arm. He does not describe it as being numbness. He had no vomiting or diaphoresis. He's had no recent fevers or worsening cough. Patient has no further complaints.  10 Systems reviewed and are negative for acute change except as noted in the HPI.     Past Medical History  Diagnosis Date  . Hypertension   . Hyperlipemia   . Coronary artery disease   . Tobacco abuse    Past Surgical History  Procedure Laterality Date  . Knee surgery      bilateral arthroscopic  . Coronary artery bypass graft N/A 06/17/2014    Procedure: CORONARY ARTERY BYPASS GRAFTING (CABG) X 4, RIGHT LEG EVH;  Surgeon: Gaye Pollack, MD;  Location: [facility];  Service: Open Heart Surgery;  Laterality: N/A;  . Tee without cardioversion N/A 06/17/2014    Procedure: TRANSESOPHAGEAL ECHOCARDIOGRAM (TEE);  Surgeon: Gaye Pollack, MD;  Location: [facility];  Service: Open Heart Surgery;  Laterality: N/A;   Family  History  Problem Relation Age of Onset  . Drug abuse Sister   . Healthy Brother   . Diabetes Sister   . Healthy Sister   . Healthy Sister   . Healthy Sister   . Healthy Sister   . Healthy Brother    History  Substance Use Topics  . Smoking status: Current Every Day Smoker -- 1.00 packs/day for 30 years    Types: Cigarettes  . Smokeless tobacco: Never Used  . Alcohol Use: Yes     Comment: occasionally    Review of Systems    Allergies  Review of patient's allergies indicates no known allergies.  Home Medications   Prior to Admission medications   Medication Sig Start Date End Date Taking? Authorizing Provider  ALPRAZolam Duanne Moron) 0.5 MG tablet Take 1 tablet (0.5 mg total) by mouth 2 (two) times daily as needed for anxiety. 07/06/14  Yes Mary-Margaret Hassell Done, FNP  aspirin EC 325 MG EC tablet Take 1 tablet (325 mg total) by mouth daily. 06/23/14  Yes Wayne E Gold, PA-C  fenofibrate 160 MG tablet Take 1 tablet (160 mg total) by mouth daily. 07/19/14  Yes Minus Breeding, MD  ibuprofen (ADVIL,MOTRIN) 800 MG tablet Take 1 tablet (800 mg total) by mouth every 8 (eight) hours as needed for moderate pain. Take 3 times daily regularly for 2-3 days, then can take as needed. 07/06/14  Yes Mary-Margaret Hassell Done, FNP  lisinopril (PRINIVIL,ZESTRIL) 10 MG tablet Take 1  tablet (10 mg total) by mouth 2 (two) times daily. 07/19/14  Yes Minus Breeding, MD  metoprolol tartrate (LOPRESSOR) 25 MG tablet Take 1 tablet (25 mg total) by mouth 2 (two) times daily. 07/19/14  Yes Minus Breeding, MD  omeprazole (PRILOSEC) 20 MG capsule Take 20 mg by mouth daily as needed (Acid reflux).   Yes Historical Provider, MD  simvastatin (ZOCOR) 40 MG tablet Take 1 tablet (40 mg total) by mouth daily. 07/19/14  Yes Minus Breeding, MD   BP 135/76 mmHg  Pulse 80  Temp(Src) 98.2 F (36.8 C) (Oral)  Resp 18  Ht 5\' 8"  (1.727 m)  Wt 195 lb (88.451 kg)  BMI 29.66 kg/m2  SpO2 92% Physical Exam  Constitutional: He is  oriented to person, place, and time. Vital signs are normal. He appears well-developed and well-nourished.  Non-toxic appearance. He does not appear ill. No distress.  HENT:  Head: Normocephalic and atraumatic.  Nose: Nose normal.  Mouth/Throat: Oropharynx is clear and moist. No oropharyngeal exudate.  Eyes: Conjunctivae and EOM are normal. Pupils are equal, round, and reactive to light. No scleral icterus.  Neck: Normal range of motion. Neck supple. No tracheal deviation, no edema, no erythema and normal range of motion present. No thyroid mass and no thyromegaly present.  Cardiovascular: Normal rate, regular rhythm, S1 normal, S2 normal, normal heart sounds, intact distal pulses and normal pulses.  Exam reveals no gallop and no friction rub.   No murmur heard. Pulses:      Radial pulses are 2+ on the right side, and 2+ on the left side.       Dorsalis pedis pulses are 2+ on the right side, and 2+ on the left side.  Pulmonary/Chest: Effort normal and breath sounds normal. No respiratory distress. He has no wheezes. He has no rhonchi. He has no rales.  Abdominal: Soft. Normal appearance and bowel sounds are normal. He exhibits no distension, no ascites and no mass. There is no hepatosplenomegaly. There is no tenderness. There is no rebound, no guarding and no CVA tenderness.  Musculoskeletal: Normal range of motion. He exhibits no edema or tenderness.  Lymphadenopathy:    He has no cervical adenopathy.  Neurological: He is alert and oriented to person, place, and time. He has normal strength. No cranial nerve deficit or sensory deficit. He exhibits normal muscle tone. GCS eye subscore is 4. GCS verbal subscore is 5. GCS motor subscore is 6.  Skin: Skin is warm, dry and intact. No petechiae and no rash noted. He is not diaphoretic. No erythema. No pallor.  Psychiatric: He has a normal mood and affect. His behavior is normal. Judgment normal.  Nursing note and vitals reviewed.   ED Course   Procedures (including critical care time) Labs Review Labs Reviewed  CBC - Abnormal; Notable for the following:    Hemoglobin 12.3 (*)    HCT 37.2 (*)    All other components within normal limits  BASIC METABOLIC PANEL - Abnormal; Notable for the following:    GFR calc non Af Amer 81 (*)    All other components within normal limits  TROPONIN I  I-STAT TROPOININ, ED  Randolm Idol, ED    Imaging Review Dg Chest 2 View  07/20/2014   CLINICAL DATA:  Left lower chest pain.  Recent bypass.  EXAM: CHEST  2 VIEW  COMPARISON:  06/26/2014  FINDINGS: Postoperative changes in the mediastinum. Slight blunting of the left costophrenic angle suggesting small residual pleural effusion, decreased since  previous study appear The heart size and mediastinal contours are within normal limits. Both lungs are clear. The visualized skeletal structures are unremarkable.  IMPRESSION: Small decreasing left pleural effusion. No evidence of active pulmonary disease.   Electronically Signed   By: Lucienne Capers M.D.   On: 07/20/2014 03:05     EKG Interpretation   Date/Time:  Wednesday July 20 2014 06:12:54 EST Ventricular Rate:  78 PR Interval:  188 QRS Duration: 89 QT Interval:  408 QTC Calculation: 465 R Axis:   76 Text Interpretation:  Sinus rhythm Nonspecific T abnormalities, lateral  leads Minimal ST elevation, inferior leads No significant change since  last tracing Confirmed by Glynn Octave [phone]) on 07/20/2014  6:29:38 AM      MDM   Final diagnoses:  Chest pain    Patient presents emergency department out of concern for chest pain. Patient had pericardial and pleural drains after surgery. His effusions have improved looking at his x-ray today. I believe his pain could be due to postop pain and residual pain from the effusion. Nevertheless we did evaluate his heart with a delta troponin rule out. His troponin was negative 2, EKG is unchanged. His heart score is currently 3.  Patient is safe for discharge and advised to follow with cardiology within 3 days for continued treatment. His vital signs remain within his normal limits and is safe for discharge.    Everlene Balls, MD 07/20/14 2136

## 2014-07-21 ENCOUNTER — Telehealth: Payer: Self-pay | Admitting: *Deleted

## 2014-07-21 NOTE — Telephone Encounter (Signed)
ORDER FAXED FOR PATIENT TO START CARDIAC REHAB

## 2014-07-25 ENCOUNTER — Other Ambulatory Visit: Payer: Self-pay | Admitting: Surgery

## 2014-07-25 DIAGNOSIS — I200 Unstable angina: Secondary | ICD-10-CM

## 2014-07-27 ENCOUNTER — Ambulatory Visit (INDEPENDENT_AMBULATORY_CARE_PROVIDER_SITE_OTHER): Payer: Self-pay | Admitting: Surgery

## 2014-07-27 ENCOUNTER — Encounter: Payer: Self-pay | Admitting: Surgery

## 2014-07-27 ENCOUNTER — Ambulatory Visit
Admission: RE | Admit: 2014-07-27 | Discharge: 2014-07-27 | Disposition: A | Discharge status: Home / Self Care | Payer: BC Managed Care – PPO | Source: Ambulatory Visit | Attending: Surgery | Admitting: Surgery

## 2014-07-27 VITALS — BP 183/94 | HR 64 | Resp 20 | Ht 68.0 in | Wt 194.0 lb

## 2014-07-27 DIAGNOSIS — I200 Unstable angina: Secondary | ICD-10-CM

## 2014-07-27 DIAGNOSIS — Z951 Presence of aortocoronary bypass graft: Secondary | ICD-10-CM

## 2014-07-27 DIAGNOSIS — I2510 Atherosclerotic heart disease of native coronary artery without angina pectoris: Secondary | ICD-10-CM

## 2014-07-28 ENCOUNTER — Telehealth (HOSPITAL_COMMUNITY): Payer: Self-pay | Admitting: *Deleted

## 2014-07-28 ENCOUNTER — Telehealth: Payer: Self-pay | Admitting: Cardiology

## 2014-07-28 DIAGNOSIS — E785 Hyperlipidemia, unspecified: Secondary | ICD-10-CM

## 2014-07-28 DIAGNOSIS — I10 Essential (primary) hypertension: Secondary | ICD-10-CM

## 2014-07-28 NOTE — Telephone Encounter (Signed)
Spoke with patient. He reports higher BPs than normal, especially since after his LHC & CABG. He states he increased his medications as directed by Dr. Percival Spanish on 11/3 and went to ED next day with chest discomfort but checked out OK - he states it feels like something is poking his left lung. He reports he cannot go to cardiac rehab until his BP is controlled and he would like to start rehab before end of year as he has met his deductible. He saw Dr. Cyndia Bent yesterday and had his home BP machine checked against their BP measurement and he reports it was very close. He states Dr. Cyndia Bent noted his BP was high but did not want to make changes - preferred that one MD monitor and manage BP (per patient)  BP Readings:  166/92 172/90 140/82 160/92 160/86 149/90 165/87 220/130 (highest)  Reports HR is mid 60s - high 70s.   Patient is scheduled to see Mickel Baas, NP on 12/1 for BP management in order to be cleared for cardiac rehab - but this message will be deferred to Dr. Percival Spanish to review and advise - to see if med adjustments can be made over phone, or if in office visit is preferred and with whom

## 2014-07-28 NOTE — Telephone Encounter (Signed)
Pt's wife is calling to see if Dr. Percival Spanish can work Mr. Danowski in to be seen because his BP is running at 200/143. She does not think he can wait until 12/1 to see Cecilie Kicks. Please call  Thanks

## 2014-07-29 ENCOUNTER — Encounter: Payer: Self-pay | Admitting: Surgery

## 2014-07-29 MED ORDER — LISINOPRIL 20 MG PO TABS: ORAL_TABLET | ORAL | Status: DC

## 2014-07-29 NOTE — Telephone Encounter (Signed)
Received a call from patient.Dr.Hochrein advised to increase Lisinopril to 20 mg twice a day.Bmet in 2 weeks 08/12/14.Advised to continue to monitor B/P and bring diary of B/P readings to 08/16/14 appointment with Cecilie Kicks NP.

## 2014-07-29 NOTE — Telephone Encounter (Signed)
Patient was notified by Malachy Mood, LPN

## 2014-07-29 NOTE — Telephone Encounter (Signed)
LMTCB for med advice

## 2014-07-29 NOTE — Progress Notes (Signed)
     HPI:  Patient returns for routine postoperative follow-up having undergone CABG x 4  on 06/17/2014. The patient's early postoperative recovery while in the hospital was notable for an uncomplicated postop course. 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 He was admitted overnight and had his elbow tapped by orthopedics confirming gout. He then presented to the ER again on 11/4 with left chest pain and had negative troponin x 2 and and unchanged ECG. It was felt that this was probably postop pain and he was sent home. It has not recurred.   Current Outpatient Prescriptions  Medication Sig Dispense Refill  . ALPRAZolam (XANAX) 0.5 MG tablet Take 1 tablet (0.5 mg total) by mouth 2 (two) times daily as needed for anxiety. 60 tablet 2  . aspirin EC 325 MG EC tablet Take 1 tablet (325 mg total) by mouth daily.    . fenofibrate 160 MG tablet Take 1 tablet (160 mg total) by mouth daily. 90 tablet 4  . ibuprofen (ADVIL,MOTRIN) 800 MG tablet Take 1 tablet (800 mg total) by mouth every 8 (eight) hours as needed for moderate pain. Take 3 times daily regularly for 2-3 days, then can take as needed. 30 tablet 3  . metoprolol tartrate (LOPRESSOR) 25 MG tablet Take 1 tablet (25 mg total) by mouth 2 (two) times daily. 180 tablet 4  . omeprazole (PRILOSEC) 20 MG capsule Take 20 mg by mouth daily as needed (Acid reflux).    . simvastatin (ZOCOR) 40 MG tablet Take 1 tablet (40 mg total) by mouth daily. 90 tablet 4  . lisinopril (PRINIVIL,ZESTRIL) 20 MG tablet Take 20 mg twice a day 60 tablet 6   No current facility-administered medications for this visit.    Physical Exam: BP 183/94 mmHg  Pulse 64  Resp 20  Ht 5\' 8"  (1.727 m)  Wt 194 lb (87.998 kg)  BMI 29.50 kg/m2  SpO2 97% He looks well. Lung exam is clear. Cardiac exam shows a regular rate and rhythm with normal heart sounds. Chest incision is  healing well and sternum is stable. The leg incisions are healing well and there is no peripheral edema.    Diagnostic Tests:  CLINICAL DATA: Chest pain, intermittent  EXAM: CHEST 2 VIEW  COMPARISON: July 20, 2014  FINDINGS: There is no edema or consolidation. The heart size and pulmonary vascularity are normal. No adenopathy. No pneumothorax. There is degenerative change in the thoracic spine. Patient is status post coronary artery bypass grafting.  IMPRESSION: No edema or consolidation.   Electronically Signed  By: Lowella Grip M.D.  On: 07/27/2014 10:34  Impression:  Overall I think he is doing well. He is hypertensive and his lisinopril was doubled to 10 bid last week by Dr. Alba Cory. I encouraged him to continue walking. He is planning to participate in cardiac rehab. I told him he could drive his car but should not lift anything heavier than 10 lbs for three months postop.   Plan:  He will follow up with Dr. Percival Spanish and discuss further changes in his antihypertensive regimen with him. He will return to see me if he develops any problems with his incisions.

## 2014-07-29 NOTE — Telephone Encounter (Signed)
Increase lisinopril to 20 mg bid and check BMET in two weeks.

## 2014-08-02 ENCOUNTER — Encounter: Payer: Self-pay | Admitting: *Deleted

## 2014-08-02 ENCOUNTER — Telehealth: Payer: Self-pay | Admitting: Cardiology

## 2014-08-02 ENCOUNTER — Encounter: Payer: Self-pay | Admitting: Cardiology

## 2014-08-02 ENCOUNTER — Ambulatory Visit (INDEPENDENT_AMBULATORY_CARE_PROVIDER_SITE_OTHER): Payer: BC Managed Care – PPO | Admitting: Cardiology

## 2014-08-02 DIAGNOSIS — I10 Essential (primary) hypertension: Secondary | ICD-10-CM

## 2014-08-02 MED ORDER — AMLODIPINE BESYLATE 10 MG PO TABS: 10.0000 mg | ORAL_TABLET | Freq: Every day | ORAL | Status: DC

## 2014-08-02 NOTE — Assessment & Plan Note (Signed)
CABG x 4 - 06/17/14

## 2014-08-02 NOTE — Telephone Encounter (Signed)
Spoke to Eric Saunders She states she is concerned about her father blood - it has been elevated since CABG. RN informed daughter she is not listed as a DPR. RN informed daugther that she will contact her father discuss the issue RN informed daughter to have father to place on the list. She verbalized understanding.

## 2014-08-02 NOTE — Telephone Encounter (Signed)
Kari(daughter) called in stating that the pt's BP is still running high(180/93) and she feels that he needs to be seen immediately. She says that his BP has been consistently high since his heart surgery. Please call  Thanks

## 2014-08-02 NOTE — Assessment & Plan Note (Signed)
Seen in the ER for acute gouty arthritis Rt elbow 06/26/14

## 2014-08-02 NOTE — Assessment & Plan Note (Signed)
In the office for follow up B/P. Repeat B/P by me 166/88.

## 2014-08-02 NOTE — Assessment & Plan Note (Deleted)
Seen in the ER for acute gouty arthritis Rt elbow 06/26/14

## 2014-08-02 NOTE — Assessment & Plan Note (Addendum)
Anxious, he has lots of questions about the care he has been given

## 2014-08-02 NOTE — Progress Notes (Signed)
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 He saw Dr Percival Spanish in May for chest pain. He had a GXT that was abnormal. He declined cath initially and this was eventually done 06/16/14. This reveled severe 3 vessel CAD.  He underwent CABG x 4 06/17/14. He tolerated this well. He was seen once in the ER  06/26/14 for what turned out to be gouty arthritis in his Rt elbow. He was seen again for chest pain 07/20/14 fel tot be non cardiac. He has had problems with his B/P being elevated and his medications have been adjusted . They called today saying his B/P is still poorly controlled and wanted to be seen. He is anxious. He and his wife seem suspicious about the care they have been given. He doesn't understand why his B/P is elavated, "it wasn't before surgery". He also says he felt better before he had his CABG. He is anxious about when he can return to work, Dr Evert Kohl has told him Dec 27 th. He has not been to able to do cardiac rehab because his B/P has been elevated.     Current Outpatient Prescriptions  Medication Sig Dispense Refill  . ALPRAZolam (XANAX) 0.5 MG tablet Take 1 tablet (0.5 mg total) by mouth 2 (two) times daily as needed for anxiety. 60 tablet 2  . aspirin EC 325 MG EC tablet Take 1 tablet (325 mg total) by mouth daily.    . fenofibrate 160 MG tablet Take 1 tablet (160 mg total) by mouth daily. 90 tablet 4  . lisinopril (PRINIVIL,ZESTRIL) 20 MG tablet Take 20 mg twice a day 60 tablet 6  . metoprolol tartrate (LOPRESSOR) 25 MG tablet Take 1 tablet (25 mg total) by mouth 2 (two) times daily. 180 tablet 4  . omeprazole (PRILOSEC) 20 MG capsule Take 20 mg by mouth daily as needed (Acid reflux).    . simvastatin (ZOCOR) 40 MG tablet Take 1 tablet (40 mg total) by mouth daily. 90 tablet 4  . amLODipine (NORVASC) 10 MG tablet Take 1 tablet (10  mg total) by mouth daily. 30 tablet 11  . ibuprofen (ADVIL,MOTRIN) 800 MG tablet Take 1 tablet (800 mg total) by mouth every 8 (eight) hours as needed for moderate pain. Take 3 times daily regularly for 2-3 days, then can take as needed. 30 tablet 3   No current facility-administered medications for this visit.    No Known Allergies  History   Social History  . Marital Status: Married    Spouse Name: N/A    Number of Children: N/A  . Years of Education: N/A   Occupational History  . Not on file.   Social History Main Topics  . Smoking status: Current Every Day Smoker -- 1.00 packs/day for 30 years    Types: Cigarettes  . Smokeless tobacco: Never Used  . Alcohol Use: Yes     Comment: occasionally  . Drug Use: No  . Sexual Activity: Yes   Other Topics Concern  . Not on file   Social History Narrative     Review of Systems: General: negative for chills, fever, night sweats or weight changes.  Cardiovascular: negative for chest pain, dyspnea on exertion, edema, orthopnea, palpitations, paroxysmal nocturnal dyspnea or shortness of breath Dermatological: negative for rash Respiratory: negative for cough or wheezing Urologic: negative for hematuria Abdominal: negative  for nausea, vomiting, diarrhea, bright red blood per rectum, melena, or hematemesis Neurologic: negative for visual changes, syncope, or dizziness All other systems reviewed and are otherwise negative except as noted above.    Blood pressure 160/100, pulse 59, height 5\' 8"  (1.727 m), weight 193 lb (87.544 kg).  General appearance: alert, cooperative, no distress and anxious Lungs: clear to auscultation bilaterally Heart: regular rate and rhythm   ASSESSMENT AND PLAN:   Hypertension, poor control In the office for follow up B/P. Repeat B/P by me 166/88.  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 He has an appointment to see Cecilie Kicks 12/1//15. If his B/P is controlled he can be cleared from our standpoint to return to work 09/11/14. He has lab work scheduled before this visit.   [facility] 08/02/2014 12:34 PM

## 2014-08-02 NOTE — Telephone Encounter (Signed)
Spoke with patient. RN informed him that his daughter called office concerning his issue with elevated blood pressure Patient states it will okay to speak with his daughter . RN asked patient -blood pressure-states yesterday B/P 190/103 AND 176/104. Last night 168/94 Patient is very concerned about blood pressure "I don't understand my blood pressure was good before surgery and since surgery it has been up. I been trying to go rehab,but can not because of this. " Patient voiced sounds anxious. RN asked if patient was dressed for the day . Patient states yes. RN informed patient an appointment today with Kerin Ransom PA at [facility] [address] office at 11:30 Bring readings and medication/or list to appointment. Patient verbalized understanding.

## 2014-08-10 ENCOUNTER — Other Ambulatory Visit: Payer: BC Managed Care – PPO

## 2014-08-10 LAB — COMPREHENSIVE METABOLIC PANEL
ALBUMIN: 4.4 g/dL (ref 3.5–5.2)
ALT: 14 U/L (ref 0–53)
AST: 13 U/L (ref 0–37)
Alkaline Phosphatase: 46 U/L (ref 39–117)
BUN: 19 mg/dL (ref 6–23)
CALCIUM: 10 mg/dL (ref 8.4–10.5)
CHLORIDE: 104 meq/L (ref 96–112)
CO2: 24 mEq/L (ref 19–32)
Creat: 1.04 mg/dL (ref 0.50–1.35)
Glucose, Bld: 76 mg/dL (ref 70–99)
POTASSIUM: 4.4 meq/L (ref 3.5–5.3)
Sodium: 140 mEq/L (ref 135–145)
Total Bilirubin: 0.3 mg/dL (ref 0.2–1.2)
Total Protein: 7.6 g/dL (ref 6.0–8.3)

## 2014-08-10 LAB — CBC
HEMATOCRIT: 46 % (ref 39.0–52.0)
Hemoglobin: 15.4 g/dL (ref 13.0–17.0)
MCH: 27.8 pg (ref 26.0–34.0)
MCHC: 33.5 g/dL (ref 30.0–36.0)
MCV: 83.2 fL (ref 78.0–100.0)
PLATELETS: 407 10*3/uL — AB (ref 150–400)
RBC: 5.53 MIL/uL (ref 4.22–5.81)
RDW: 14.3 % (ref 11.5–15.5)
WBC: 7.7 10*3/uL (ref 4.0–10.5)

## 2014-08-11 LAB — PROTIME-INR
INR: 1.03 (ref <1.50)
Prothrombin Time: 13.5 seconds (ref 11.6–15.2)

## 2014-08-11 LAB — APTT: aPTT: 33 seconds (ref 24–37)

## 2014-08-11 LAB — URINALYSIS, MICROSCOPIC ONLY
Bacteria, UA: NONE SEEN
Casts: NONE SEEN
Crystals: NONE SEEN
Squamous Epithelial / LPF: NONE SEEN

## 2014-08-16 ENCOUNTER — Encounter: Payer: Self-pay | Admitting: Cardiology

## 2014-08-16 ENCOUNTER — Ambulatory Visit (INDEPENDENT_AMBULATORY_CARE_PROVIDER_SITE_OTHER): Payer: BC Managed Care – PPO | Admitting: Cardiology

## 2014-08-16 VITALS — BP 110/78 | HR 74 | Ht 68.0 in | Wt 196.0 lb

## 2014-08-16 DIAGNOSIS — I10 Essential (primary) hypertension: Secondary | ICD-10-CM

## 2014-08-16 DIAGNOSIS — Z951 Presence of aortocoronary bypass graft: Secondary | ICD-10-CM

## 2014-08-16 DIAGNOSIS — F17200 Nicotine dependence, unspecified, uncomplicated: Secondary | ICD-10-CM | POA: Insufficient documentation

## 2014-08-16 DIAGNOSIS — Z720 Tobacco use: Secondary | ICD-10-CM | POA: Insufficient documentation

## 2014-08-16 NOTE — Patient Instructions (Signed)
Your physician recommends that you schedule a follow-up appointment in: two weeks with an extender  We will call Cardiac Rehab and get you set up to restart

## 2014-08-16 NOTE — Assessment & Plan Note (Signed)
Improving BP control, pt feeling better.  Will check with TCTS about weight limits.

## 2014-08-16 NOTE — Assessment & Plan Note (Signed)
Improved control with norvasc.  Will release to cardiac rehab and see back with APP in 2 weeks for recheck BP.  Will release to go to work though will check on weight limits with TCTS.

## 2014-08-16 NOTE — Assessment & Plan Note (Signed)
Has decreased, will continue to decrease.

## 2014-08-16 NOTE — Progress Notes (Addendum)
08/16/2014   PCP: Redge Gainer, MD   Chief Complaint  Patient presents with  . Hypertension    Primary Cardiologist: Dr. 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 He saw Dr Percival Spanish in May for chest pain. He had a GXT that was abnormal. He declined cath initially and this was eventually done 06/16/14. This reveled severe 3 vessel CAD. He underwent CABG x 4 06/17/14. He tolerated this well. He was seen once in the ER 06/26/14 for what turned out to be gouty arthritis in his Rt elbow. He was seen again for chest pain 07/20/14 fel tot be non cardiac. He has had problems with his B/P being elevated and his medications have been adjusted .   Today he is seen today for follow up for elevated B/P.  His BP had been poorly controlled.  He is anxious. He is anxious about when he can return to work, Dr Evert Kohl has told him Dec 27 th. He has not been to able to do cardiac rehab because his B/P has been elevated.  With work he may have to lift up to 50-60 pounds.    His list of BPs has improved over the last 2 weeks.  Plan will be to go to rehab then see [place] back in 2 weeks, if BP controlled he will be able to return to work.  Will check with TCTS to see weight limit.   No chest pain, no SOB.  He continues to smoke, was at 2 ppd now about 1 to only half a PPD.  We discussed the importance of stopping.  He stated it was difficult to stop and especially with a beer.  We discussed holding off the alcohol until he had better control of stopping the cigarettes.  He will continue to decrease use.  No Known Allergies  Current Outpatient Prescriptions  Medication Sig Dispense Refill  . ALPRAZolam (XANAX) 0.5 MG tablet Take 1 tablet (0.5 mg total) by mouth 2 (two) times daily as needed for anxiety. 60 tablet 2  . amLODipine (NORVASC) 10 MG tablet Take 1 tablet (10 mg total) by mouth daily. 30 tablet 11  . aspirin EC 325 MG EC tablet Take 1 tablet (325 mg  total) by mouth daily.    . fenofibrate 160 MG tablet Take 1 tablet (160 mg total) by mouth daily. 90 tablet 4  . ibuprofen (ADVIL,MOTRIN) 800 MG tablet Take 1 tablet (800 mg total) by mouth every 8 (eight) hours as needed for moderate pain. Take 3 times daily regularly for 2-3 days, then can take as needed. 30 tablet 3  . lisinopril (PRINIVIL,ZESTRIL) 20 MG tablet Take 20 mg twice a day 60 tablet 6  . metoprolol tartrate (LOPRESSOR) 25 MG tablet Take 1 tablet (25 mg total) by mouth 2 (two) times daily. 180 tablet 4  . omeprazole (PRILOSEC) 20 MG capsule Take 20 mg by mouth daily as needed (Acid reflux).    . simvastatin (ZOCOR) 40 MG tablet Take 1 tablet (40 mg total) by mouth daily. 90 tablet 4   No current facility-administered medications for this visit.    Past Medical History  Diagnosis Date  . Hypertension   . Hyperlipemia   . Coronary artery disease   . Tobacco abuse     Past Surgical History  Procedure Laterality Date  . Knee surgery      bilateral arthroscopic  . Coronary artery  bypass graft N/A 06/17/2014    Procedure: CORONARY ARTERY BYPASS GRAFTING (CABG) X 4, RIGHT LEG EVH;  Surgeon: Gaye Pollack, MD;  Location: [place];  Service: Open Heart Surgery;  Laterality: N/A;  . Tee without cardioversion N/A 06/17/2014    Procedure: TRANSESOPHAGEAL ECHOCARDIOGRAM (TEE);  Surgeon: Gaye Pollack, MD;  Location: [place];  Service: Open Heart Surgery;  Laterality: N/A;    PNT:IRWERXV:QM colds or fevers, no weight changes CV:see HPI PUL:see HPI GI:no diarrhea constipation or melena, no indigestion GU:no hematuria, no dysuria MS:no joint pain, no claudication Neuro:no syncope, no lightheadedness Endo:no diabetes, no thyroid disease  Wt Readings from Last 3 Encounters:  08/16/14 196 lb (88.905 kg)  08/02/14 193 lb (87.544 kg)  07/27/14 194 lb (87.998 kg)    PHYSICAL EXAM BP 110/78 mmHg  Pulse 74  Ht 5\' 8"  (1.727 m)  Wt 196 lb (88.905 kg)  BMI 29.81 kg/m2 General:Pleasant  affect, NAD Skin:Warm and dry, brisk capillary refill HEENT:normocephalic, sclera clear, mucus membranes moist Heart:S1S2 RRR without murmur, gallup, rub or click Lungs:clear without rales, rhonchi, or wheezes Ext:no lower ext edema, 2+ pedal pulses, 2+ radial pulses Neuro:alert and oriented, MAE, follows commands, + facial symmetry   ASSESSMENT AND PLAN Hypertension, poor control Improved control with norvasc.  Will release to cardiac rehab and see back with APP in 2 weeks for recheck BP.  Will release to go to work though will check on weight limits with TCTS.   S/P CABG x 4 Improving BP control, pt feeling better.  Will check with TCTS about weight limits.     Tobacco abuse Has decreased, will continue to decrease.

## 2014-08-18 ENCOUNTER — Encounter (HOSPITAL_COMMUNITY)
Admission: RE | Admit: 2014-08-18 | Discharge: 2014-08-18 | Disposition: A | Discharge status: Home / Self Care | Payer: BC Managed Care – PPO | Source: Ambulatory Visit | Attending: Cardiology | Admitting: Cardiology

## 2014-08-18 DIAGNOSIS — F17210 Nicotine dependence, cigarettes, uncomplicated: Secondary | ICD-10-CM | POA: Insufficient documentation

## 2014-08-18 DIAGNOSIS — E119 Type 2 diabetes mellitus without complications: Secondary | ICD-10-CM | POA: Insufficient documentation

## 2014-08-18 DIAGNOSIS — Z951 Presence of aortocoronary bypass graft: Secondary | ICD-10-CM | POA: Insufficient documentation

## 2014-08-18 DIAGNOSIS — E785 Hyperlipidemia, unspecified: Secondary | ICD-10-CM | POA: Insufficient documentation

## 2014-08-18 DIAGNOSIS — I10 Essential (primary) hypertension: Secondary | ICD-10-CM | POA: Insufficient documentation

## 2014-08-18 DIAGNOSIS — Z5189 Encounter for other specified aftercare: Secondary | ICD-10-CM | POA: Insufficient documentation

## 2014-08-18 DIAGNOSIS — I2510 Atherosclerotic heart disease of native coronary artery without angina pectoris: Secondary | ICD-10-CM | POA: Insufficient documentation

## 2014-08-18 DIAGNOSIS — Z79899 Other long term (current) drug therapy: Secondary | ICD-10-CM | POA: Insufficient documentation

## 2014-08-18 NOTE — Progress Notes (Signed)
Cardiac Rehab Medication Review by a Pharmacist  Does the patient  feel that his/her medications are working for him/her?  yes  Has the patient been experiencing any side effects to the medications prescribed?  Yes - some leg cramping when driving in the car for a long period of time  Does the patient measure his/her own blood pressure or blood glucose at home?  yes   Does the patient have any problems obtaining medications due to transportation or finances?   no  Understanding of regimen: good Understanding of indications: good Potential of compliance: good    Pharmacist comments: Patient has no barriers to obtaining medications.  Patient claims that since being started on amlodipine that his BP is very well controlled now.  He does not have any side effects except for leg cramping when driving in the car for an extended period of time.   Cassie L. Nicole Kindred, PharmD Clinical Pharmacy Resident Pager: [phone] 08/18/2014 8:12 AM

## 2014-08-22 ENCOUNTER — Encounter (HOSPITAL_COMMUNITY)
Admission: RE | Admit: 2014-08-22 | Discharge: 2014-08-22 | Disposition: A | Discharge status: Home / Self Care | Payer: BC Managed Care – PPO | Source: Ambulatory Visit | Attending: Cardiology | Admitting: Cardiology

## 2014-08-22 DIAGNOSIS — Z79899 Other long term (current) drug therapy: Secondary | ICD-10-CM | POA: Diagnosis not present

## 2014-08-22 DIAGNOSIS — E119 Type 2 diabetes mellitus without complications: Secondary | ICD-10-CM | POA: Diagnosis not present

## 2014-08-22 DIAGNOSIS — I2510 Atherosclerotic heart disease of native coronary artery without angina pectoris: Secondary | ICD-10-CM | POA: Diagnosis not present

## 2014-08-22 DIAGNOSIS — Z951 Presence of aortocoronary bypass graft: Secondary | ICD-10-CM | POA: Diagnosis not present

## 2014-08-22 DIAGNOSIS — Z5189 Encounter for other specified aftercare: Secondary | ICD-10-CM | POA: Diagnosis not present

## 2014-08-22 DIAGNOSIS — I10 Essential (primary) hypertension: Secondary | ICD-10-CM | POA: Diagnosis not present

## 2014-08-22 DIAGNOSIS — F17210 Nicotine dependence, cigarettes, uncomplicated: Secondary | ICD-10-CM | POA: Diagnosis not present

## 2014-08-22 DIAGNOSIS — E785 Hyperlipidemia, unspecified: Secondary | ICD-10-CM | POA: Diagnosis not present

## 2014-08-22 NOTE — Progress Notes (Signed)
Pt in today for first day of exercise in phase II at the 6:45 exercise class.  Pt tolerated exercise with no complains.  Elevated bp noted pre but did resolve post exercise.  Monitor showed SR with no noted ectopy. Medication list reconciled.  Pt verbalizes compliance.  PHQ2 score 0.  Pt with healthy outlook on life with appropriate coping skills.  Pt has support from family and friends. Cherre Huger, BSN

## 2014-08-24 ENCOUNTER — Encounter (HOSPITAL_COMMUNITY)
Admission: RE | Admit: 2014-08-24 | Discharge: 2014-08-24 | Disposition: A | Discharge status: Home / Self Care | Payer: BC Managed Care – PPO | Source: Ambulatory Visit | Attending: Cardiology | Admitting: Cardiology

## 2014-08-24 DIAGNOSIS — Z5189 Encounter for other specified aftercare: Secondary | ICD-10-CM | POA: Diagnosis not present

## 2014-08-25 ENCOUNTER — Encounter (HOSPITAL_COMMUNITY): Payer: Self-pay | Admitting: Cardiovascular Disease

## 2014-08-26 ENCOUNTER — Encounter (HOSPITAL_COMMUNITY)
Admission: RE | Admit: 2014-08-26 | Discharge: 2014-08-26 | Disposition: A | Discharge status: Home / Self Care | Payer: BC Managed Care – PPO | Source: Ambulatory Visit | Attending: Cardiology | Admitting: Cardiology

## 2014-08-26 DIAGNOSIS — Z5189 Encounter for other specified aftercare: Secondary | ICD-10-CM | POA: Diagnosis not present

## 2014-08-29 ENCOUNTER — Encounter (HOSPITAL_COMMUNITY)
Admission: RE | Admit: 2014-08-29 | Discharge: 2014-08-29 | Disposition: A | Discharge status: Home / Self Care | Payer: BC Managed Care – PPO | Source: Ambulatory Visit | Attending: Cardiology | Admitting: Cardiology

## 2014-08-29 DIAGNOSIS — Z5189 Encounter for other specified aftercare: Secondary | ICD-10-CM | POA: Diagnosis not present

## 2014-08-29 NOTE — Progress Notes (Signed)
Reviewed home exercise guidelines with patient including endpoints, temperature precautions, target heart rate and rate of perceived exertion. Pt is walking as his mode of home exercise. Pt voices understanding of instructions given. Sol Passer, MS, ACSM CCEP

## 2014-08-29 NOTE — Progress Notes (Signed)
Pt given rehab report for appt on Tuesday.  Pt is off to a good start with the completion of 4 exercise sessions.  Pt with borderline elevation of bp at rest with the beginning of exercise.  This typically resolves as exercise progress.  Pt hopes it will be determined when he may return back to work.  Pt reports his job is very physical often has to lift 100 pounds and sometimes heavier.  Pt is unsure of his weight limitations at this point in his recovery after heart surgery.  Pt hopes this will be clearer for him after tomorrows appt. Cherre Huger, BSN

## 2014-08-30 ENCOUNTER — Encounter: Payer: Self-pay | Admitting: Physician Assistant

## 2014-08-30 ENCOUNTER — Ambulatory Visit (INDEPENDENT_AMBULATORY_CARE_PROVIDER_SITE_OTHER): Payer: BC Managed Care – PPO | Admitting: Physician Assistant

## 2014-08-30 VITALS — BP 146/98 | HR 65 | Ht 68.0 in | Wt 192.6 lb

## 2014-08-30 DIAGNOSIS — Z720 Tobacco use: Secondary | ICD-10-CM

## 2014-08-30 DIAGNOSIS — Z951 Presence of aortocoronary bypass graft: Secondary | ICD-10-CM

## 2014-08-30 DIAGNOSIS — I10 Essential (primary) hypertension: Secondary | ICD-10-CM

## 2014-08-30 DIAGNOSIS — E785 Hyperlipidemia, unspecified: Secondary | ICD-10-CM

## 2014-08-30 NOTE — Patient Instructions (Signed)
Your physician wants you to follow-up in: 3 Months with Dr Hochrien You will receive a reminder letter in the mail two months in advance. If you don't receive a letter, please call our office to schedule the follow-up appointment.  

## 2014-08-30 NOTE — Progress Notes (Signed)
Patient ID: Eric Saunders, male   DOB: [birth year], 58 y.o.   MRN: [id number]     Date:  08/30/2014   ID:  Eric Saunders 10/21/1955, MRN [id number]  PCP:  Redge Gainer, MD  Primary Cardiologist:  Percival Spanish     History of Present Illness: [name] is a 58 y.o. male  He saw Dr Percival Spanish in May for chest pain. He had a GXT that was abnormal. He declined cath initially and this was eventually done 06/16/14. This reveled severe 3 vessel CAD. He underwent CABG x 4 06/17/14. He tolerated this well. He was seen once in the ER 06/26/14 for what turned out to be gouty arthritis in his Rt elbow. He was seen again for chest pain 07/20/14 felt  to be non cardiac. He has had problems with his B/P being elevated and his medications have been adjusted .    He presents today for follow-up. Blood pressure is somewhat improved, based on the readings from cardiac rehabilitation, although labile at times. He's had pressures as low as 110/60 which will make having medication adjustment difficult.  He works at the [employer] working on [employer] and at times is required to lift up to 100 pounds.  He currently denies nausea, vomiting, fever, chest pain, shortness of breath, orthopnea, dizziness, PND, cough, congestion, abdominal pain, hematochezia, melena, lower extremity edema, claudication.  Wt Readings from Last 3 Encounters:  08/30/14 192 lb 9.6 oz (87.363 kg)  08/18/14 194 lb 3.6 oz (88.1 kg)  08/16/14 196 lb (88.905 kg)     Past Medical History  Diagnosis Date  . Hypertension   . Hyperlipemia   . Coronary artery disease   . Tobacco abuse     Current Outpatient Prescriptions  Medication Sig Dispense Refill  . ALPRAZolam (XANAX) 0.5 MG tablet Take 1 tablet (0.5 mg total) by mouth 2 (two) times daily as needed for anxiety. 60 tablet 2  . amLODipine (NORVASC) 10 MG tablet Take 1 tablet (10 mg total) by mouth daily. 30 tablet 11  . aspirin EC 325 MG EC tablet Take 1 tablet (325 mg  total) by mouth daily.    . fenofibrate 160 MG tablet Take 1 tablet (160 mg total) by mouth daily. 90 tablet 4  . ibuprofen (ADVIL,MOTRIN) 800 MG tablet Take 1 tablet (800 mg total) by mouth every 8 (eight) hours as needed for moderate pain. Take 3 times daily regularly for 2-3 days, then can take as needed. 30 tablet 3  . lisinopril (PRINIVIL,ZESTRIL) 20 MG tablet Take 20 mg twice a day 60 tablet 6  . metoprolol tartrate (LOPRESSOR) 25 MG tablet Take 1 tablet (25 mg total) by mouth 2 (two) times daily. 180 tablet 4  . omeprazole (PRILOSEC) 20 MG capsule Take 20 mg by mouth daily as needed (Acid reflux).    . simvastatin (ZOCOR) 40 MG tablet Take 1 tablet (40 mg total) by mouth daily. 90 tablet 4   No current facility-administered medications for this visit.    Allergies:   No Known Allergies  Social History:  The patient  reports that he has been smoking Cigarettes.  He has a 30 pack-year smoking history. He has never used smokeless tobacco. He reports that he drinks alcohol. He reports that he does not use illicit drugs.   Family history:   Family History  Problem Relation Age of Onset  . Drug abuse Sister   . Healthy Brother   . Diabetes Sister   .  Healthy Sister   . Healthy Sister   . Healthy Sister   . Healthy Sister   . Healthy Brother     ROS:  Please see the history of present illness.  All other systems reviewed and negative.   PHYSICAL EXAM: VS:  BP 146/98 mmHg  Pulse 65  Ht 5\' 8"  (1.727 m)  Wt 192 lb 9.6 oz (87.363 kg)  BMI 29.29 kg/m2 Well nourished, well developed, in no acute distress HEENT: Pupils are equal round react to light accommodation extraocular movements are intact.  Neck: no JVDNo cervical lymphadenopathy. Cardiac: Regular rate and rhythm without murmurs rubs or gallops. Lungs:  clear to auscultation bilaterally, no wheezing, rhonchi or rales Abd: soft, nontender, positive bowel sounds all quadrants, no hepatosplenomegaly Ext: no lower extremity  edema.  2+ radial and dorsalis pedis pulses. Skin: warm and dry, sternal incision is healing well Neuro:  Grossly normal    ASSESSMENT AND PLAN:  Problem List Items Addressed This Visit    Hyperlipidemia with target LDL less than 100    Continue fenofibrate and statin.    Last Lipid Panel     Component Value Date/Time   CHOL CANCELED 07/06/2014 1608   TRIG CANCELED 07/06/2014 1608   TRIG 186* 07/06/2014 1608   TRIG 129 12/28/2012 0932   HDL CANCELED 07/06/2014 1608   HDL 38* 07/06/2014 1608   LDLCALC 88 07/06/2014 1608   LDLCALC 90 07/06/2014 0000   LDLCALC 98 12/28/2012 0932        Hypertension, poor control    Blood pressure is somewhat labile.  And ranges from 110/60 in the 150s. I am not to make any changes to his current regiment at this time but he will continue to monitor blood pressure at home.    S/P CABG x 4 - Primary    Vision appears to be healing well.  Patient was cleared to return back to work after December 28.  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  I think it will be best to limit his lifting to 50 pounds until February 29.  I documented this on his return to work notice.  He continues with cardiac rehabilitation.    Tobacco abuse    Cessation discussed. Patient continues to try and quit

## 2014-08-30 NOTE — Assessment & Plan Note (Signed)
Vision appears to be healing well.  Patient was cleared to return back to work after December 28.  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  I think it will be best to limit his lifting to 50 pounds until February 29.  I documented this on his return to work notice.  He continues with cardiac rehabilitation.

## 2014-08-30 NOTE — Assessment & Plan Note (Signed)
Continue fenofibrate and statin.    Last Lipid Panel     Component Value Date/Time   CHOL CANCELED 07/06/2014 1608   TRIG CANCELED 07/06/2014 1608   TRIG 186* 07/06/2014 1608   TRIG 129 12/28/2012 0932   HDL CANCELED 07/06/2014 1608   HDL 38* 07/06/2014 1608   LDLCALC 88 07/06/2014 1608   LDLCALC 90 07/06/2014 0000   LDLCALC 98 12/28/2012 0932

## 2014-08-30 NOTE — Assessment & Plan Note (Signed)
Cessation discussed. Patient continues to try and quit

## 2014-08-30 NOTE — Assessment & Plan Note (Signed)
Blood pressure is somewhat labile.  And ranges from 110/60 in the 150s. I am not to make any changes to his current regiment at this time but he will continue to monitor blood pressure at home.

## 2014-08-31 ENCOUNTER — Encounter (HOSPITAL_COMMUNITY)
Admission: RE | Admit: 2014-08-31 | Discharge: 2014-08-31 | Disposition: A | Discharge status: Home / Self Care | Payer: BC Managed Care – PPO | Source: Ambulatory Visit | Attending: Cardiology | Admitting: Cardiology

## 2014-08-31 DIAGNOSIS — Z5189 Encounter for other specified aftercare: Secondary | ICD-10-CM | POA: Diagnosis not present

## 2014-09-02 ENCOUNTER — Encounter (HOSPITAL_COMMUNITY)
Admission: RE | Admit: 2014-09-02 | Discharge: 2014-09-02 | Disposition: A | Discharge status: Home / Self Care | Payer: BC Managed Care – PPO | Source: Ambulatory Visit | Attending: Cardiology | Admitting: Cardiology

## 2014-09-02 DIAGNOSIS — Z5189 Encounter for other specified aftercare: Secondary | ICD-10-CM | POA: Diagnosis not present

## 2014-09-05 ENCOUNTER — Encounter (HOSPITAL_COMMUNITY)
Admission: RE | Admit: 2014-09-05 | Discharge: 2014-09-05 | Disposition: A | Discharge status: Home / Self Care | Payer: BC Managed Care – PPO | Source: Ambulatory Visit | Attending: Cardiology | Admitting: Cardiology

## 2014-09-05 DIAGNOSIS — Z5189 Encounter for other specified aftercare: Secondary | ICD-10-CM | POA: Diagnosis not present

## 2014-09-07 ENCOUNTER — Encounter (HOSPITAL_COMMUNITY)
Admission: RE | Admit: 2014-09-07 | Discharge: 2014-09-07 | Disposition: A | Discharge status: Home / Self Care | Payer: BC Managed Care – PPO | Source: Ambulatory Visit | Attending: Cardiology | Admitting: Cardiology

## 2014-09-07 DIAGNOSIS — Z5189 Encounter for other specified aftercare: Secondary | ICD-10-CM | POA: Diagnosis not present

## 2014-09-12 ENCOUNTER — Encounter (HOSPITAL_COMMUNITY)
Admission: RE | Admit: 2014-09-12 | Discharge: 2014-09-12 | Disposition: A | Discharge status: Home / Self Care | Payer: BC Managed Care – PPO | Source: Ambulatory Visit | Attending: Cardiology | Admitting: Cardiology

## 2014-09-12 DIAGNOSIS — Z5189 Encounter for other specified aftercare: Secondary | ICD-10-CM | POA: Diagnosis not present

## 2014-09-12 NOTE — Progress Notes (Signed)
Pt discharges today from cardiac rehab phase II with the completion of 9 exercise sessions. Pt elects to discharge early due to returning to work on January 4th.  Pt work schedule will not allow him to continue his participation in cardiac rehab.  Pt attended education classes that were available to him during this time period.  Pt made good progress with his exercise however this was hampered by his high blood pressure readings on exertion.  This would usually resolves as exercise progress.  Pt with no complaints during exertion with exercise.  This was a goal for pt to "test the waters" to see what he could do in a safe environment. Pt feels comfortable going back to work.  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  Pt continues to smoke however he is planning to stop for the new year.  Pt feels pressure from his family to stop smoking and he feels he is ready to.  Pt declined offered classes for tobacco cessation.  Medication list reconciled.  Pt verbalizes compliance with medication therapy.  Repeat PHQ2 score 0.  Pt feels he has achieved his goals for increase strength and feel good about going back to work.  Pt demonstrates positive and appropriate coping skills.  It was a pleasure working with this patient. Cherre Huger, BSN

## 2014-09-14 ENCOUNTER — Encounter (HOSPITAL_COMMUNITY): Payer: BC Managed Care – PPO

## 2014-09-19 ENCOUNTER — Encounter (HOSPITAL_COMMUNITY): Payer: BC Managed Care – PPO

## 2014-09-21 ENCOUNTER — Encounter (HOSPITAL_COMMUNITY): Payer: BC Managed Care – PPO

## 2014-09-23 ENCOUNTER — Encounter (HOSPITAL_COMMUNITY): Payer: BC Managed Care – PPO

## 2014-09-26 ENCOUNTER — Encounter (HOSPITAL_COMMUNITY): Payer: BC Managed Care – PPO

## 2014-09-28 ENCOUNTER — Encounter (HOSPITAL_COMMUNITY): Payer: BC Managed Care – PPO

## 2014-09-30 ENCOUNTER — Encounter (HOSPITAL_COMMUNITY): Payer: BC Managed Care – PPO

## 2014-10-03 ENCOUNTER — Encounter (HOSPITAL_COMMUNITY): Payer: BC Managed Care – PPO

## 2014-10-05 ENCOUNTER — Encounter (HOSPITAL_COMMUNITY): Payer: BC Managed Care – PPO

## 2014-10-07 ENCOUNTER — Encounter (HOSPITAL_COMMUNITY): Payer: BC Managed Care – PPO

## 2014-10-10 ENCOUNTER — Encounter (HOSPITAL_COMMUNITY): Payer: BC Managed Care – PPO

## 2014-10-12 ENCOUNTER — Encounter (HOSPITAL_COMMUNITY): Payer: BC Managed Care – PPO

## 2014-10-14 ENCOUNTER — Encounter (HOSPITAL_COMMUNITY): Payer: BC Managed Care – PPO

## 2014-10-17 ENCOUNTER — Encounter (HOSPITAL_COMMUNITY): Payer: BC Managed Care – PPO

## 2014-10-19 ENCOUNTER — Encounter (HOSPITAL_COMMUNITY): Payer: BC Managed Care – PPO

## 2014-10-21 ENCOUNTER — Encounter (HOSPITAL_COMMUNITY): Payer: BC Managed Care – PPO

## 2014-10-24 ENCOUNTER — Encounter (HOSPITAL_COMMUNITY): Payer: BC Managed Care – PPO

## 2014-10-26 ENCOUNTER — Encounter (HOSPITAL_COMMUNITY): Payer: BC Managed Care – PPO

## 2014-10-28 ENCOUNTER — Encounter (HOSPITAL_COMMUNITY): Payer: BC Managed Care – PPO

## 2014-10-31 ENCOUNTER — Encounter (HOSPITAL_COMMUNITY): Payer: BC Managed Care – PPO

## 2014-11-02 ENCOUNTER — Encounter (HOSPITAL_COMMUNITY): Payer: BC Managed Care – PPO

## 2014-11-04 ENCOUNTER — Encounter (HOSPITAL_COMMUNITY): Payer: BC Managed Care – PPO

## 2014-11-07 ENCOUNTER — Encounter (HOSPITAL_COMMUNITY): Payer: BC Managed Care – PPO

## 2014-11-09 ENCOUNTER — Encounter (HOSPITAL_COMMUNITY): Payer: BC Managed Care – PPO

## 2014-11-11 ENCOUNTER — Encounter (HOSPITAL_COMMUNITY): Payer: BC Managed Care – PPO

## 2014-11-18 ENCOUNTER — Other Ambulatory Visit: Payer: Self-pay | Admitting: Nurse Practitioner

## 2014-11-18 NOTE — Telephone Encounter (Signed)
Last filled 09/15/14, last seen 06/30/14. Pt uses [facility] [phone]

## 2014-11-20 NOTE — Telephone Encounter (Signed)
Please call in xanax with 1 refills 

## 2014-11-23 ENCOUNTER — Encounter: Payer: Self-pay | Admitting: Nurse Practitioner

## 2014-11-23 ENCOUNTER — Ambulatory Visit (INDEPENDENT_AMBULATORY_CARE_PROVIDER_SITE_OTHER): Payer: BLUE CROSS/BLUE SHIELD | Admitting: Nurse Practitioner

## 2014-11-23 VITALS — BP 134/75 | HR 66 | Temp 97.0°F | Ht 68.0 in | Wt 190.0 lb

## 2014-11-23 DIAGNOSIS — I10 Essential (primary) hypertension: Secondary | ICD-10-CM

## 2014-11-23 DIAGNOSIS — L03113 Cellulitis of right upper limb: Secondary | ICD-10-CM

## 2014-11-23 DIAGNOSIS — E785 Hyperlipidemia, unspecified: Secondary | ICD-10-CM

## 2014-11-23 DIAGNOSIS — F411 Generalized anxiety disorder: Secondary | ICD-10-CM

## 2014-11-23 DIAGNOSIS — M545 Low back pain, unspecified: Secondary | ICD-10-CM

## 2014-11-23 MED ORDER — IBUPROFEN 800 MG PO TABS
800.0000 mg | ORAL_TABLET | Freq: Three times a day (TID) | ORAL | Status: DC | PRN
Start: 2014-11-23 — End: 2014-11-23

## 2014-11-23 MED ORDER — IBUPROFEN 800 MG PO TABS: ORAL_TABLET | ORAL | Status: DC

## 2014-11-23 MED ORDER — CYCLOBENZAPRINE HCL 10 MG PO TABS: 10.0000 mg | ORAL_TABLET | Freq: Three times a day (TID) | ORAL | Status: DC | PRN

## 2014-11-23 NOTE — Addendum Note (Signed)
Addended by: [name] on: 11/23/2014 03:46 PM   Modules accepted: Orders, Medications

## 2014-11-23 NOTE — Patient Instructions (Addendum)

## 2014-11-23 NOTE — Progress Notes (Addendum)
Subjective:    Patient ID: Eric Saunders, male    DOB: 1956-07-01, 59 y.o.   MRN: [id number]   Patient here today for follow up of chronic medical problems.  Hyperlipidemia This is a chronic problem. The current episode started more than 1 year ago. The problem is controlled. Recent lipid tests were reviewed and are normal. He has no history of diabetes, hypothyroidism or obesity. Pertinent negatives include no chest pain or shortness of breath. Current antihyperlipidemic treatment includes statins. The current treatment provides moderate improvement of lipids. Compliance problems include adherence to diet and adherence to exercise.  Risk factors for coronary artery disease include dyslipidemia, hypertension and male sex.  Hypertension This is a chronic problem. The current episode started more than 1 year ago. The problem is unchanged. The problem is controlled. Pertinent negatives include no chest pain, headaches, palpitations or shortness of breath. Risk factors for coronary artery disease include dyslipidemia and male gender. Past treatments include beta blockers and ACE inhibitors. The current treatment provides moderate improvement. Compliance problems include diet and exercise.  Hypertensive end-organ damage includes CAD/MI. There is no history of CVA or PVD.  GAD This is a chronic problem. Pt states he is only having to take Xanax a few times a week.  * Patient c/o low back pain- bending increases pain.  Review of Systems  Constitutional: Negative.   HENT: Negative.   Respiratory: Negative for shortness of breath.   Cardiovascular: Negative for chest pain and palpitations.  Musculoskeletal: Negative.   Neurological: Negative for headaches.  Psychiatric/Behavioral: Negative.   All other systems reviewed and are negative.      Objective:   Physical Exam  Constitutional: He is oriented to person, place, and time. He appears well-developed and well-nourished.  HENT:  Head:  Normocephalic.  Right Ear: External ear normal.  Left Ear: External ear normal.  Nose: Nose normal.  Mouth/Throat: Oropharynx is clear and moist.  Eyes: EOM are normal. Pupils are equal, round, and reactive to light.  Neck: Normal range of motion. Neck supple. No thyromegaly present.  Cardiovascular: Normal rate, regular rhythm, normal heart sounds and intact distal pulses.   No murmur heard. Pulmonary/Chest: Effort normal and breath sounds normal. He has no wheezes. He has no rales.  Abdominal: Soft. Bowel sounds are normal.  Genitourinary: Prostate normal and penis normal.  Musculoskeletal: Normal range of motion.  Pain mid  Lower back on palpation (-) SLR bil Motor strength and sensation distally intact  Neurological: He is alert and oriented to person, place, and time.  Skin: Skin is warm and dry.  Psychiatric: He has a normal mood and affect. His behavior is normal. Judgment and thought content normal.     BP 134/75 mmHg  Pulse 66  Temp(Src) 97 F (36.1 C) (Oral)  Ht $R'5\' 8"'Dh$  (1.727 m)  Wt 190 lb (86.183 kg)  BMI 28.90 kg/m2      Assessment & Plan:   1. Hypertension, poor control Do not ad slat o diet - CMP14+EGFR  2. Hyperlipidemia with target LDL less than 100 Low fat  - NMR, lipoprofile  3. GAD (generalized anxiety disorder) Stress management  4. Midline low back pain without sciatica Moist heat  Rest limit bending and stooping to less than 4 hours a day until February 15, 2015 - cyclobenzaprine (FLEXERIL) 10 MG tablet; Take 1 tablet (10 mg total) by mouth 3 (three) times daily as needed for muscle spasms.  Dispense: 30 tablet; Refill: 1    Labs  pending Health maintenance reviewed Diet and exercise encouraged Continue all meds Follow up  In 3 month   [place], FNP

## 2014-11-24 ENCOUNTER — Other Ambulatory Visit: Payer: Self-pay | Admitting: Nurse Practitioner

## 2014-11-24 LAB — CMP14+EGFR
ALT: 12 IU/L (ref 0–44)
AST: 13 IU/L (ref 0–40)
Albumin/Globulin Ratio: 1.8 (ref 1.1–2.5)
Albumin: 4.4 g/dL (ref 3.5–5.5)
Alkaline Phosphatase: 50 IU/L (ref 39–117)
BUN/Creatinine Ratio: 18 (ref 9–20)
BUN: 19 mg/dL (ref 6–24)
Bilirubin Total: 0.4 mg/dL (ref 0.0–1.2)
CHLORIDE: 100 mmol/L (ref 97–108)
CO2: 23 mmol/L (ref 18–29)
Calcium: 9.8 mg/dL (ref 8.7–10.2)
Creatinine, Ser: 1.03 mg/dL (ref 0.76–1.27)
GFR calc Af Amer: 93 mL/min/{1.73_m2} (ref >59)
GFR calc non Af Amer: 81 mL/min/{1.73_m2} (ref >59)
Globulin, Total: 2.5 g/dL (ref 1.5–4.5)
Glucose: 84 mg/dL (ref 65–99)
Potassium: 4.3 mmol/L (ref 3.5–5.2)
SODIUM: 141 mmol/L (ref 134–144)
TOTAL PROTEIN: 6.9 g/dL (ref 6.0–8.5)

## 2014-11-24 LAB — NMR, LIPOPROFILE
Cholesterol: 165 mg/dL (ref 100–199)
HDL CHOLESTEROL BY NMR: 44 mg/dL (ref >39)
HDL Particle Number: 37.3 umol/L (ref >=30.5)
LDL Particle Number: 1139 nmol/L — ABNORMAL HIGH (ref <1000)
LDL Size: 20.7 nm (ref >20.5)
LDL-C: 90 mg/dL (ref 0–99)
LP-IR Score: 79 — ABNORMAL HIGH (ref <=45)
SMALL LDL PARTICLE NUMBER: 569 nmol/L — AB (ref <=527)
Triglycerides by NMR: 157 mg/dL — ABNORMAL HIGH (ref 0–149)

## 2014-11-29 ENCOUNTER — Telehealth: Payer: Self-pay | Admitting: Cardiology

## 2014-11-29 NOTE — Telephone Encounter (Signed)
Closed encounter °

## 2014-12-01 ENCOUNTER — Ambulatory Visit: Payer: BC Managed Care – PPO | Admitting: Cardiology

## 2014-12-30 ENCOUNTER — Encounter: Payer: Self-pay | Admitting: Cardiology

## 2014-12-30 ENCOUNTER — Ambulatory Visit (INDEPENDENT_AMBULATORY_CARE_PROVIDER_SITE_OTHER): Payer: BLUE CROSS/BLUE SHIELD | Admitting: Cardiology

## 2014-12-30 VITALS — BP 108/64 | HR 72 | Ht 68.0 in | Wt 190.2 lb

## 2014-12-30 DIAGNOSIS — I2510 Atherosclerotic heart disease of native coronary artery without angina pectoris: Secondary | ICD-10-CM | POA: Diagnosis not present

## 2014-12-30 NOTE — Progress Notes (Signed)
HPI The patient returns for  followup after CABG. He says he is doing well. Since I last saw him he's had some mild soreness in his left upper chest which has been there since his surgery. He otherwise is not having any other complaints he had prior to surgery. He denies any palpitations, presyncope or syncope. He denies any shortness of breath, PND or orthopnea. He has had no weight gain or edema. He does get some soreness and muscle aches and more tiredness at the end of the day and he used to.  No Known Allergies  Current Outpatient Prescriptions  Medication Sig Dispense Refill  . ALPRAZolam (XANAX) 0.5 MG tablet TAKE ONE TABLET BY MOUTH TWICE DAILY AS NEEDED 60 tablet 1  . amLODipine (NORVASC) 10 MG tablet Take 1 tablet (10 mg total) by mouth daily. 30 tablet 11  . aspirin EC 325 MG EC tablet Take 1 tablet (325 mg total) by mouth daily.    . cyclobenzaprine (FLEXERIL) 10 MG tablet Take 1 tablet (10 mg total) by mouth 3 (three) times daily as needed for muscle spasms. 30 tablet 1  . fenofibrate 160 MG tablet Take 1 tablet (160 mg total) by mouth daily. 90 tablet 4  . ibuprofen (ADVIL,MOTRIN) 800 MG tablet 1 po TID prn 90 tablet 3  . lisinopril (PRINIVIL,ZESTRIL) 20 MG tablet Take 20 mg twice a day 60 tablet 6  . metoprolol tartrate (LOPRESSOR) 25 MG tablet Take 1 tablet (25 mg total) by mouth 2 (two) times daily. 180 tablet 4  . omeprazole (PRILOSEC) 20 MG capsule Take 20 mg by mouth daily as needed (Acid reflux).    . simvastatin (ZOCOR) 40 MG tablet Take 1 tablet (40 mg total) by mouth daily. 90 tablet 4   No current facility-administered medications for this visit.    Past Medical History  Diagnosis Date  . Hypertension   . Hyperlipemia   . Coronary artery disease   . Tobacco abuse     Past Surgical History  Procedure Laterality Date  . Knee surgery      bilateral arthroscopic  . Coronary artery bypass graft N/A 06/17/2014    Procedure: CORONARY ARTERY BYPASS GRAFTING  (CABG) X 4, RIGHT LEG EVH;  Surgeon: Gaye Pollack, MD;  Location: [facility];  Service: Open Heart Surgery;  Laterality: N/A;  . Tee without cardioversion N/A 06/17/2014    Procedure: TRANSESOPHAGEAL ECHOCARDIOGRAM (TEE);  Surgeon: Gaye Pollack, MD;  Location: [facility];  Service: Open Heart Surgery;  Laterality: N/A;  . Left heart catheterization with coronary angiogram N/A 06/16/2014    Procedure: LEFT HEART CATHETERIZATION WITH CORONARY ANGIOGRAM;  Surgeon: Burnell Blanks, MD;  Location[facility];  Service: Cardiovascular;  Laterality: N/A;    ROS:  As stated in the HPI and negative for all other systems.  PHYSICAL EXAM BP 108/64 mmHg  Pulse 72  Ht 5\' 8"  (1.727 m)  Wt 190 lb 3.2 oz (86.274 kg)  BMI 28.93 kg/m2 GENERAL:  Well appearing HEENT:  Pupils equal round and reactive, fundi not visualized, oral mucosa unremarkable NECK:  No jugular venous distention, waveform within normal limits, carotid upstroke brisk and symmetric, no bruits, no thyromegaly LYMPHATICS:  No cervical, inguinal adenopathy LUNGS:  Clear to auscultation bilaterally BACK:  No CVA tenderness CHEST:  Well healed sternotomy scar. HEART:  PMI not displaced or sustained,S1 and S2 within normal limits, no S3, no S4, no clicks, no rubs, no  murmurs ABD:  Flat, positive bowel  sounds normal in frequency in pitch, no bruits, no rebound, no guarding, no midline pulsatile mass, no hepatomegaly, no splenomegaly EXT:  2 plus pulses throughout, mild right ankle edema, no cyanosis no clubbing SKIN:  No rashes no nodules NEURO:  Cranial nerves II through XII grossly intact, motor grossly intact throughout PSYCH:  Cognitively intact, oriented to person place and time   ASSESSMENT AND PLAN  CAD/CABG:   The patient is now status post CABG.    He has some mild sternal pain. Otherwise no cardiovascular testing is suggested.  TOBACCO:  We discussed a specific strategy for tobacco cessation  completely.  Hopefully he will pick  a date and quit.  DYSLIPIDEMIA:   Because of his muscle aches I am going to have him take a month-long vacation from Zocor. If he gets better he'll restart it. If not we'll know it was not that medication.  HTN:   Since I saw him he has had a stress titrated blood pressure is better controlled.

## 2014-12-30 NOTE — Patient Instructions (Signed)
Your physician wants you to follow-up in: 1 Year. You will receive a reminder letter in the mail two months in advance. If you don't receive a letter, please call our office to schedule the follow-up appointment.  

## 2015-01-18 ENCOUNTER — Other Ambulatory Visit: Payer: Self-pay | Admitting: Nurse Practitioner

## 2015-02-17 ENCOUNTER — Other Ambulatory Visit: Payer: Self-pay | Admitting: Nurse Practitioner

## 2015-02-17 NOTE — Telephone Encounter (Signed)
Please call in xanax with 1 refills 

## 2015-02-17 NOTE — Telephone Encounter (Signed)
Last filled 01/18/15, last seen 11/23/14. Call into [facility] [phone]

## 2015-03-08 ENCOUNTER — Telehealth: Payer: Self-pay | Admitting: Nurse Practitioner

## 2015-03-08 MED ORDER — AZITHROMYCIN 250 MG PO TABS: ORAL_TABLET | ORAL | Status: DC

## 2015-03-08 NOTE — Telephone Encounter (Signed)
Patient has a runny nose, cough, low grade fever, and wants to know if you can please call something in for him.

## 2015-03-08 NOTE — Telephone Encounter (Signed)
zithromax rx sent to pharmacy- hold simvastatin while taking antibiotic

## 2015-03-08 NOTE — Telephone Encounter (Signed)
Pt notified rx was sent in and to hold simvastatin while taking z-pack.

## 2015-03-16 ENCOUNTER — Other Ambulatory Visit: Payer: Self-pay | Admitting: *Deleted

## 2015-03-16 MED ORDER — LISINOPRIL 20 MG PO TABS: ORAL_TABLET | ORAL | Status: DC

## 2015-03-30 ENCOUNTER — Ambulatory Visit (INDEPENDENT_AMBULATORY_CARE_PROVIDER_SITE_OTHER): Payer: BLUE CROSS/BLUE SHIELD | Admitting: Nurse Practitioner

## 2015-03-30 ENCOUNTER — Encounter (INDEPENDENT_AMBULATORY_CARE_PROVIDER_SITE_OTHER): Payer: Self-pay

## 2015-03-30 ENCOUNTER — Encounter: Payer: Self-pay | Admitting: Nurse Practitioner

## 2015-03-30 VITALS — BP 142/86 | HR 69 | Temp 96.7°F | Ht 68.0 in | Wt 184.0 lb

## 2015-03-30 DIAGNOSIS — J0100 Acute maxillary sinusitis, unspecified: Secondary | ICD-10-CM

## 2015-03-30 MED ORDER — FLUTICASONE PROPIONATE 50 MCG/ACT NA SUSP: 2.0000 | Freq: Every day | NASAL | Status: DC

## 2015-03-30 MED ORDER — PREDNISONE 20 MG PO TABS: ORAL_TABLET | ORAL | Status: DC

## 2015-03-30 MED ORDER — AMOXICILLIN 875 MG PO TABS: 875.0000 mg | ORAL_TABLET | Freq: Two times a day (BID) | ORAL | Status: DC

## 2015-03-30 NOTE — Progress Notes (Signed)
  Subjective:     Eric Saunders is a 59 y.o. male here for evaluation of a cough. Onset of symptoms was 2 weeks ago. Symptoms have been unchanged since that time. The cough is barky, harsh, hoarse and productive of green sputum and is aggravated by nothing. Associated symptoms include: change in voice, postnasal drip and sputum production. Patient does not have a history of asthma. Patient does have a history of environmental allergens. Patient has not traveled recently. Patient does have a history of smoking. Patient has had a previous chest x-ray. Patient has not had a PPD done.  The following portions of the patient's history were reviewed and updated as appropriate: allergies, current medications, past family history, past medical history, past social history, past surgical history and problem list.  Review of Systems Pertinent items are noted in HPI.    Objective:     BP 142/86 mmHg  Pulse 69  Temp(Src) 96.7 F (35.9 C) (Oral)  Ht 5\' 8"  (1.727 m)  Wt 184 lb (83.462 kg)  BMI 27.98 kg/m2 General appearance: alert and cooperative Eyes: conjunctivae/corneas clear. PERRL, EOM's intact. Fundi benign. Ears: normal TM's and external ear canals both ears Nose: mild congestion, turbinates pale, sinus tenderness bilateral Throat: lips, mucosa, and tongue normal; teeth and gums normal Neck: no adenopathy, no carotid bruit, no JVD, supple, symmetrical, trachea midline and thyroid not enlarged, symmetric, no tenderness/mass/nodules Lungs: clear to auscultation bilaterally and dry hacky cough Heart: regular rate and rhythm, S1, S2 normal, no murmur, click, rub or gallop    Assessment:    Sinusitis    Plan:  1. Take meds as prescribed 2. Use a cool mist humidifier especially during the winter months and when heat has been humid. 3. Use saline nose sprays frequently 4. Saline irrigations of the nose can be very helpful if done frequently.  * 4X daily for 1 week*  * Use of a nettie pot can  be helpful with this. Follow directions with this* 5. Drink plenty of fluids 6. Keep thermostat turn down low 7.For any cough or congestion  Use plain Mucinex- regular strength or max strength is fine   * Children- consult with Pharmacist for dosing 8. For fever or aces or pains- take tylenol or ibuprofen appropriate for age and weight.  * for fevers greater than 101 orally you may alternate ibuprofen and tylenol every  3 hours.   STOP SMOKING!! Mary-Margaret Hassell Done, FNP

## 2015-03-30 NOTE — Patient Instructions (Signed)

## 2015-04-13 ENCOUNTER — Other Ambulatory Visit: Payer: Self-pay | Admitting: Nurse Practitioner

## 2015-04-13 NOTE — Telephone Encounter (Signed)
Last filled 03/16/15. Route and call into [facility] [phone]

## 2015-04-13 NOTE — Telephone Encounter (Signed)
Please call in xanax with 1 refills 

## 2015-07-04 ENCOUNTER — Ambulatory Visit (INDEPENDENT_AMBULATORY_CARE_PROVIDER_SITE_OTHER): Payer: BLUE CROSS/BLUE SHIELD | Admitting: Nurse Practitioner

## 2015-07-04 ENCOUNTER — Encounter: Payer: Self-pay | Admitting: Nurse Practitioner

## 2015-07-04 VITALS — BP 138/85 | HR 69 | Temp 98.0°F | Ht 68.0 in | Wt 185.0 lb

## 2015-07-04 DIAGNOSIS — E785 Hyperlipidemia, unspecified: Secondary | ICD-10-CM

## 2015-07-04 DIAGNOSIS — I10 Essential (primary) hypertension: Secondary | ICD-10-CM | POA: Diagnosis not present

## 2015-07-04 DIAGNOSIS — Z720 Tobacco use: Secondary | ICD-10-CM | POA: Diagnosis not present

## 2015-07-04 DIAGNOSIS — N5203 Combined arterial insufficiency and corporo-venous occlusive erectile dysfunction: Secondary | ICD-10-CM

## 2015-07-04 DIAGNOSIS — F411 Generalized anxiety disorder: Secondary | ICD-10-CM

## 2015-07-04 MED ORDER — FENOFIBRATE 160 MG PO TABS: 160.0000 mg | ORAL_TABLET | Freq: Every day | ORAL | Status: DC

## 2015-07-04 MED ORDER — METOPROLOL TARTRATE 25 MG PO TABS: 25.0000 mg | ORAL_TABLET | Freq: Two times a day (BID) | ORAL | Status: DC

## 2015-07-04 MED ORDER — AMLODIPINE BESYLATE 10 MG PO TABS: 10.0000 mg | ORAL_TABLET | Freq: Every day | ORAL | Status: DC

## 2015-07-04 MED ORDER — SILDENAFIL CITRATE 100 MG PO TABS: 50.0000 mg | ORAL_TABLET | Freq: Every day | ORAL | Status: DC | PRN

## 2015-07-04 NOTE — Patient Instructions (Signed)
Smoking Cessation, Tips for Success If you are ready to quit smoking, congratulations! You have chosen to help yourself be healthier. Cigarettes bring nicotine, tar, carbon monoxide, and other irritants into your body. Your lungs, heart, and blood vessels will be able to work better without these poisons. There are many different ways to quit smoking. Nicotine gum, nicotine patches, a nicotine inhaler, or nicotine nasal spray can help with physical craving. Hypnosis, support groups, and medicines help break the habit of smoking. WHAT THINGS CAN I DO TO MAKE QUITTING EASIER?  Here are some tips to help you quit for good:  Pick a date when you will quit smoking completely. Tell all of your friends and family about your plan to quit on that date.  Do not try to slowly cut down on the number of cigarettes you are smoking. Pick a quit date and quit smoking completely starting on that day.  Throw away all cigarettes.   Clean and remove all ashtrays from your home, work, and car.  On a card, write down your reasons for quitting. Carry the card with you and read it when you get the urge to smoke.  Cleanse your body of nicotine. Drink enough water and fluids to keep your urine clear or pale yellow. Do this after quitting to flush the nicotine from your body.  Learn to predict your moods. Do not let a bad situation be your excuse to have a cigarette. Some situations in your life might tempt you into wanting a cigarette.  Never have "just one" cigarette. It leads to wanting another and another. Remind yourself of your decision to quit.  Change habits associated with smoking. If you smoked while driving or when feeling stressed, try other activities to replace smoking. Stand up when drinking your coffee. Brush your teeth after eating. Sit in a different chair when you read the paper. Avoid alcohol while trying to quit, and try to drink fewer caffeinated beverages. Alcohol and caffeine may urge you to  smoke.  Avoid foods and drinks that can trigger a desire to smoke, such as sugary or spicy foods and alcohol.  Ask people who smoke not to smoke around you.  Have something planned to do right after eating or having a cup of coffee. For example, plan to take a walk or exercise.  Try a relaxation exercise to calm you down and decrease your stress. Remember, you may be tense and nervous for the first 2 weeks after you quit, but this will pass.  Find new activities to keep your hands busy. Play with a pen, coin, or rubber band. Doodle or draw things on paper.  Brush your teeth right after eating. This will help cut down on the craving for the taste of tobacco after meals. You can also try mouthwash.   Use oral substitutes in place of cigarettes. Try using lemon drops, carrots, cinnamon sticks, or chewing gum. Keep them handy so they are available when you have the urge to smoke.  When you have the urge to smoke, try deep breathing.  Designate your home as a nonsmoking area.  If you are a heavy smoker, ask your health care provider about a prescription for nicotine chewing gum. It can ease your withdrawal from nicotine.  Reward yourself. Set aside the cigarette money you save and buy yourself something nice.  Look for support from others. Join a support group or smoking cessation program. Ask someone at home or at work to help you with your plan   to quit smoking.  Always ask yourself, "Do I need this cigarette or is this just a reflex?" Tell yourself, "Today, I choose not to smoke," or "I do not want to smoke." You are reminding yourself of your decision to quit.  Do not replace cigarette smoking with electronic cigarettes (commonly called e-cigarettes). The safety of e-cigarettes is unknown, and some may contain harmful chemicals.  If you relapse, do not give up! Plan ahead and think about what you will do the next time you get the urge to smoke. HOW WILL I FEEL WHEN I QUIT SMOKING? You  may have symptoms of withdrawal because your body is used to nicotine (the addictive substance in cigarettes). You may crave cigarettes, be irritable, feel very hungry, cough often, get headaches, or have difficulty concentrating. The withdrawal symptoms are only temporary. They are strongest when you first quit but will go away within 10-14 days. When withdrawal symptoms occur, stay in control. Think about your reasons for quitting. Remind yourself that these are signs that your body is healing and getting used to being without cigarettes. Remember that withdrawal symptoms are easier to treat than the major diseases that smoking can cause.  Even after the withdrawal is over, expect periodic urges to smoke. However, these cravings are generally short lived and will go away whether you smoke or not. Do not smoke! WHAT RESOURCES ARE AVAILABLE TO HELP ME QUIT SMOKING? Your health care provider can direct you to community resources or hospitals for support, which may include:  Group support.  Education.  Hypnosis.  Therapy.   This information is not intended to replace advice given to you by your health care provider. Make sure you discuss any questions you have with your health care provider.   Document Released: 05/31/2004 Document Revised: 09/23/2014 Document Reviewed: 02/18/2013 Elsevier Interactive Patient Education 2016 [employer]  

## 2015-07-04 NOTE — Progress Notes (Signed)
Subjective:    Patient ID: Eric Saunders, male    DOB: 01/01/1956, 59 y.o.   MRN: [id number]   Patient here today for follow up of chronic medical problems.  Hyperlipidemia This is a chronic problem. The current episode started more than 1 year ago. The problem is controlled. Recent lipid tests were reviewed and are normal. He has no history of diabetes, hypothyroidism or obesity. Pertinent negatives include no chest pain or shortness of breath. Current antihyperlipidemic treatment includes statins. The current treatment provides moderate improvement of lipids. Compliance problems include adherence to diet and adherence to exercise.  Risk factors for coronary artery disease include dyslipidemia, hypertension and male sex.  Hypertension This is a chronic problem. The current episode started more than 1 year ago. The problem is unchanged. The problem is controlled. Pertinent negatives include no chest pain, headaches, palpitations or shortness of breath. Risk factors for coronary artery disease include dyslipidemia and male gender. Past treatments include beta blockers and ACE inhibitors. The current treatment provides moderate improvement. Compliance problems include diet and exercise.  Hypertensive end-organ damage includes CAD/MI. There is no history of CVA or PVD.  GAD This is a chronic problem. Pt states he is only having to take Xanax a few times a week. Erectile dysfunction Desire is there but unable to perform. This has started since he has had all of his heart issues.  Review of Systems  Constitutional: Negative.   HENT: Negative.   Respiratory: Negative for shortness of breath.   Cardiovascular: Negative for chest pain and palpitations.  Musculoskeletal: Negative.   Neurological: Negative for headaches.  Psychiatric/Behavioral: Negative.   All other systems reviewed and are negative.      Objective:   Physical Exam  Constitutional: He is oriented to person, place, and time. He  appears well-developed and well-nourished.  HENT:  Head: Normocephalic.  Right Ear: External ear normal.  Left Ear: External ear normal.  Nose: Nose normal.  Mouth/Throat: Oropharynx is clear and moist.  Eyes: EOM are normal. Pupils are equal, round, and reactive to light.  Neck: Normal range of motion. Neck supple. No thyromegaly present.  Cardiovascular: Normal rate, regular rhythm, normal heart sounds and intact distal pulses.   No murmur heard. Pulmonary/Chest: Effort normal and breath sounds normal. He has no wheezes. He has no rales.  Abdominal: Soft. Bowel sounds are normal.  Genitourinary: Prostate normal and penis normal.  Musculoskeletal: Normal range of motion.  Neurological: He is alert and oriented to person, place, and time.  Skin: Skin is warm and dry.  Psychiatric: He has a normal mood and affect. His behavior is normal. Judgment and thought content normal.     BP 138/85 mmHg  Pulse 69  Temp(Src) 98 F (36.7 C) (Oral)  Ht 5' 8" (1.727 m)  Wt 185 lb (83.915 kg)  BMI 28.14 kg/m2      Assessment & Plan:   1. GAD (generalized anxiety disorder) Stress management  2. Hypertension, poor control Blood Pressure is controlled at this visit No added salt to diet Exercise - amLODipine (NORVASC) 10 MG tablet; Take 1 tablet (10 mg total) by mouth daily.  Dispense: 30 tablet; Refill: 5 - metoprolol tartrate (LOPRESSOR) 25 MG tablet; Take 1 tablet (25 mg total) by mouth 2 (two) times daily.  Dispense: 180 tablet; Refill: 2  3. Hyperlipidemia with target LDL less than 100 Low fat diet - fenofibrate 160 MG tablet; Take 1 tablet (160 mg total) by mouth daily.  Dispense: 90 tablet;   Refill: 2 - CMP14+EGFR - Lipid panel  4. Tobacco abuse Smoking cessation discussed.  5. Erectile dysfunction  - viagra 100 mg 1 po prn #5 11 refills    Continue all meds Labs pending Health Maintenance reviewed Diet and exercise encouraged RTO 3 months  Mary-Margaret Martin,  FNP     

## 2015-07-05 LAB — CMP14+EGFR
ALT: 16 IU/L (ref 0–44)
AST: 15 IU/L (ref 0–40)
Albumin/Globulin Ratio: 1.8 (ref 1.1–2.5)
Albumin: 4.5 g/dL (ref 3.5–5.5)
Alkaline Phosphatase: 42 IU/L (ref 39–117)
BILIRUBIN TOTAL: 0.4 mg/dL (ref 0.0–1.2)
BUN / CREAT RATIO: 16 (ref 9–20)
BUN: 16 mg/dL (ref 6–24)
CHLORIDE: 101 mmol/L (ref 97–106)
CO2: 24 mmol/L (ref 18–29)
Calcium: 9.6 mg/dL (ref 8.7–10.2)
Creatinine, Ser: 1.02 mg/dL (ref 0.76–1.27)
GFR calc Af Amer: 94 mL/min/{1.73_m2} (ref >59)
GFR calc non Af Amer: 81 mL/min/{1.73_m2} (ref >59)
GLUCOSE: 86 mg/dL (ref 65–99)
Globulin, Total: 2.5 g/dL (ref 1.5–4.5)
Potassium: 4 mmol/L (ref 3.5–5.2)
Sodium: 141 mmol/L (ref 136–144)
Total Protein: 7 g/dL (ref 6.0–8.5)

## 2015-07-05 LAB — LIPID PANEL
Chol/HDL Ratio: 4.4 ratio units (ref 0.0–5.0)
Cholesterol, Total: 216 mg/dL — ABNORMAL HIGH (ref 100–199)
HDL: 49 mg/dL (ref >39)
LDL Calculated: 135 mg/dL — ABNORMAL HIGH (ref 0–99)
Triglycerides: 158 mg/dL — ABNORMAL HIGH (ref 0–149)
VLDL Cholesterol Cal: 32 mg/dL (ref 5–40)

## 2015-07-07 IMAGING — CR DG ABDOMEN ACUTE W/ 1V CHEST
3 series · 3 of 3 positions shown · non-contrast
Comparison: 06/21/2014

CLINICAL DATA: Status post recent CABG on 06/17/2014. Cough,
congestion, low-grade fever, constipation and abdominal distention.

EXAM:
ACUTE ABDOMEN SERIES (ABDOMEN 2 VIEW & CHEST 1 VIEW)

[w chest pa]
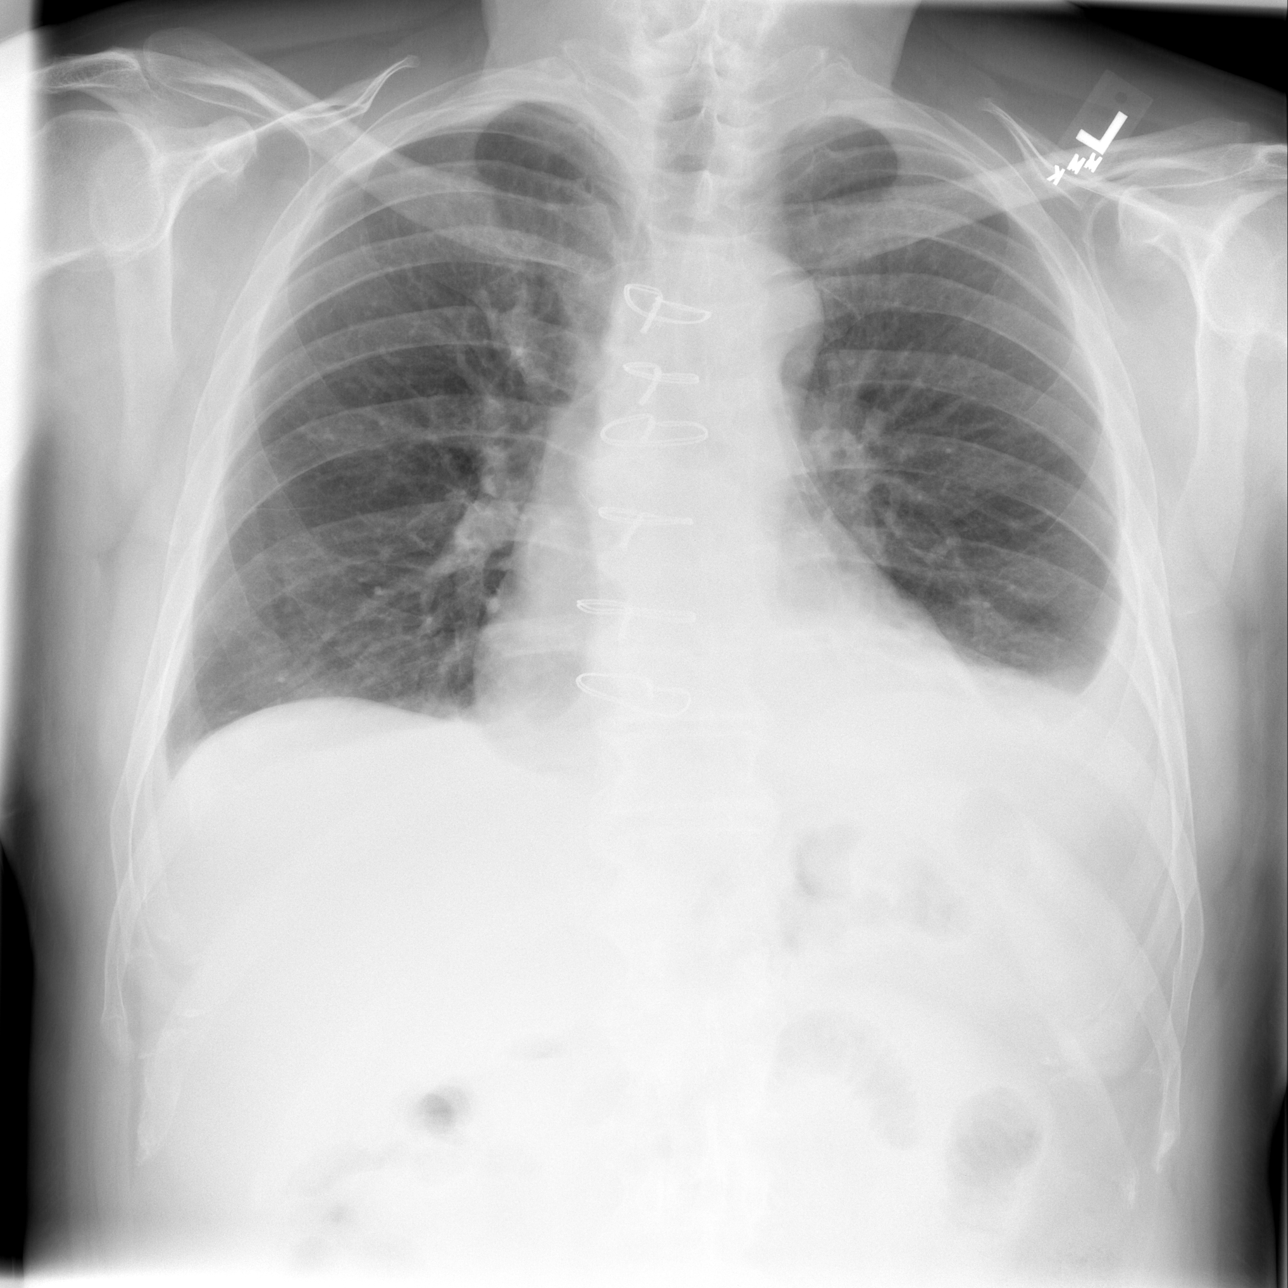

[w abdomen upright]
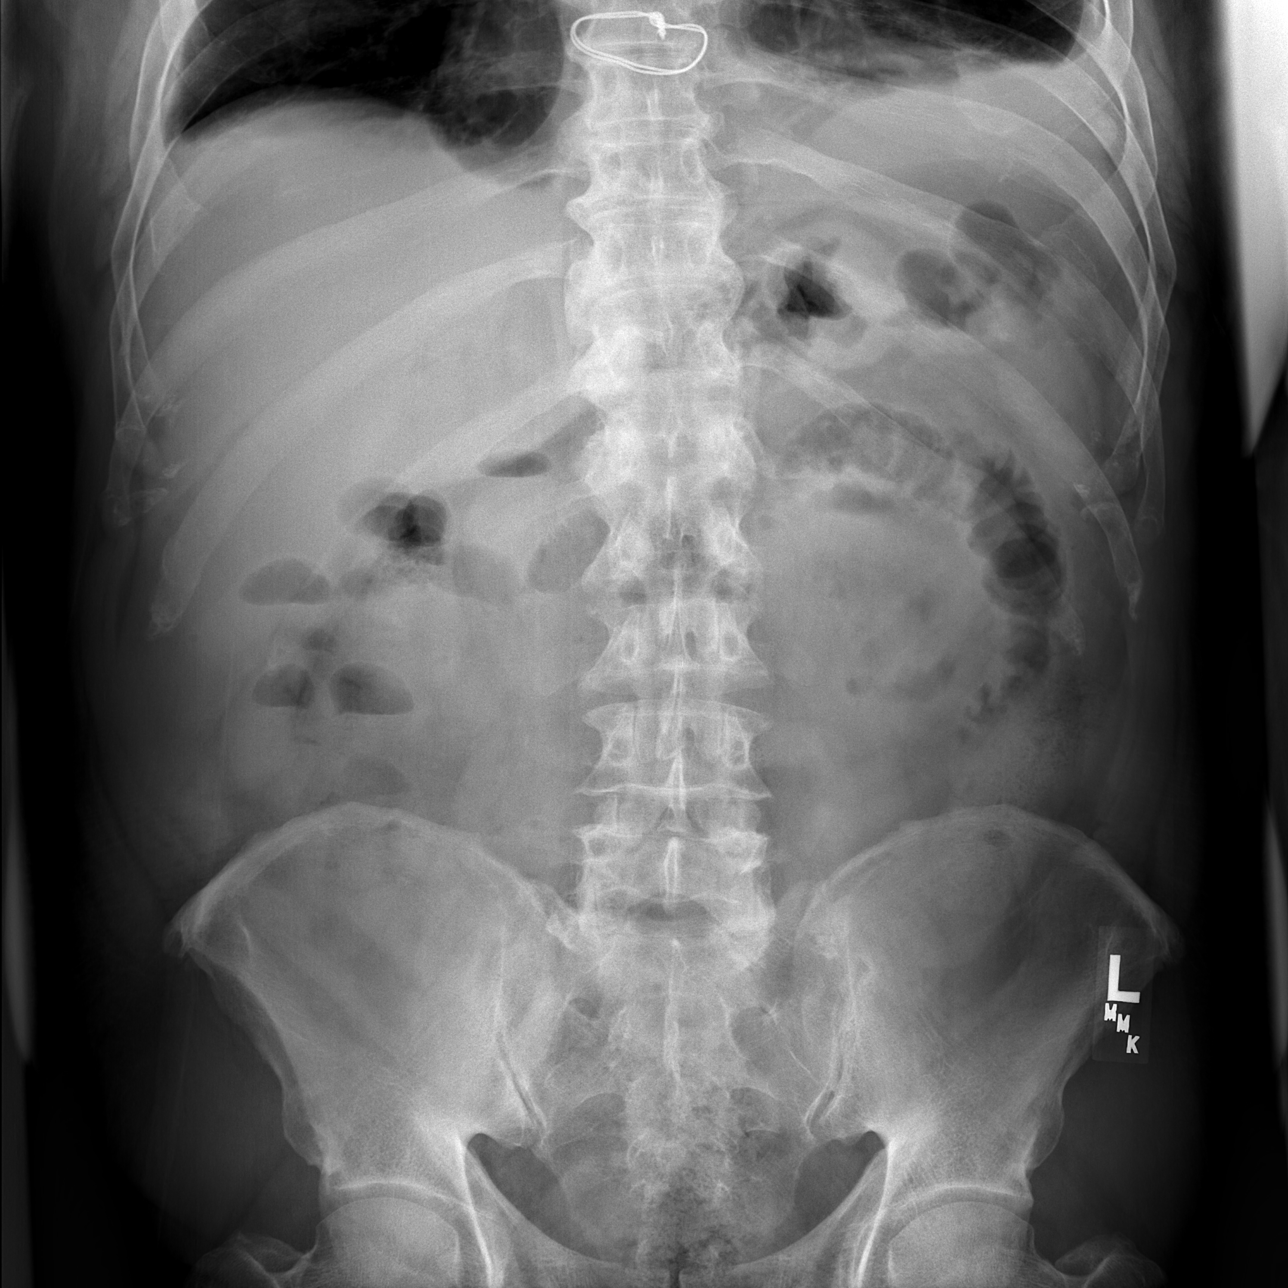

[t abdomen supine]
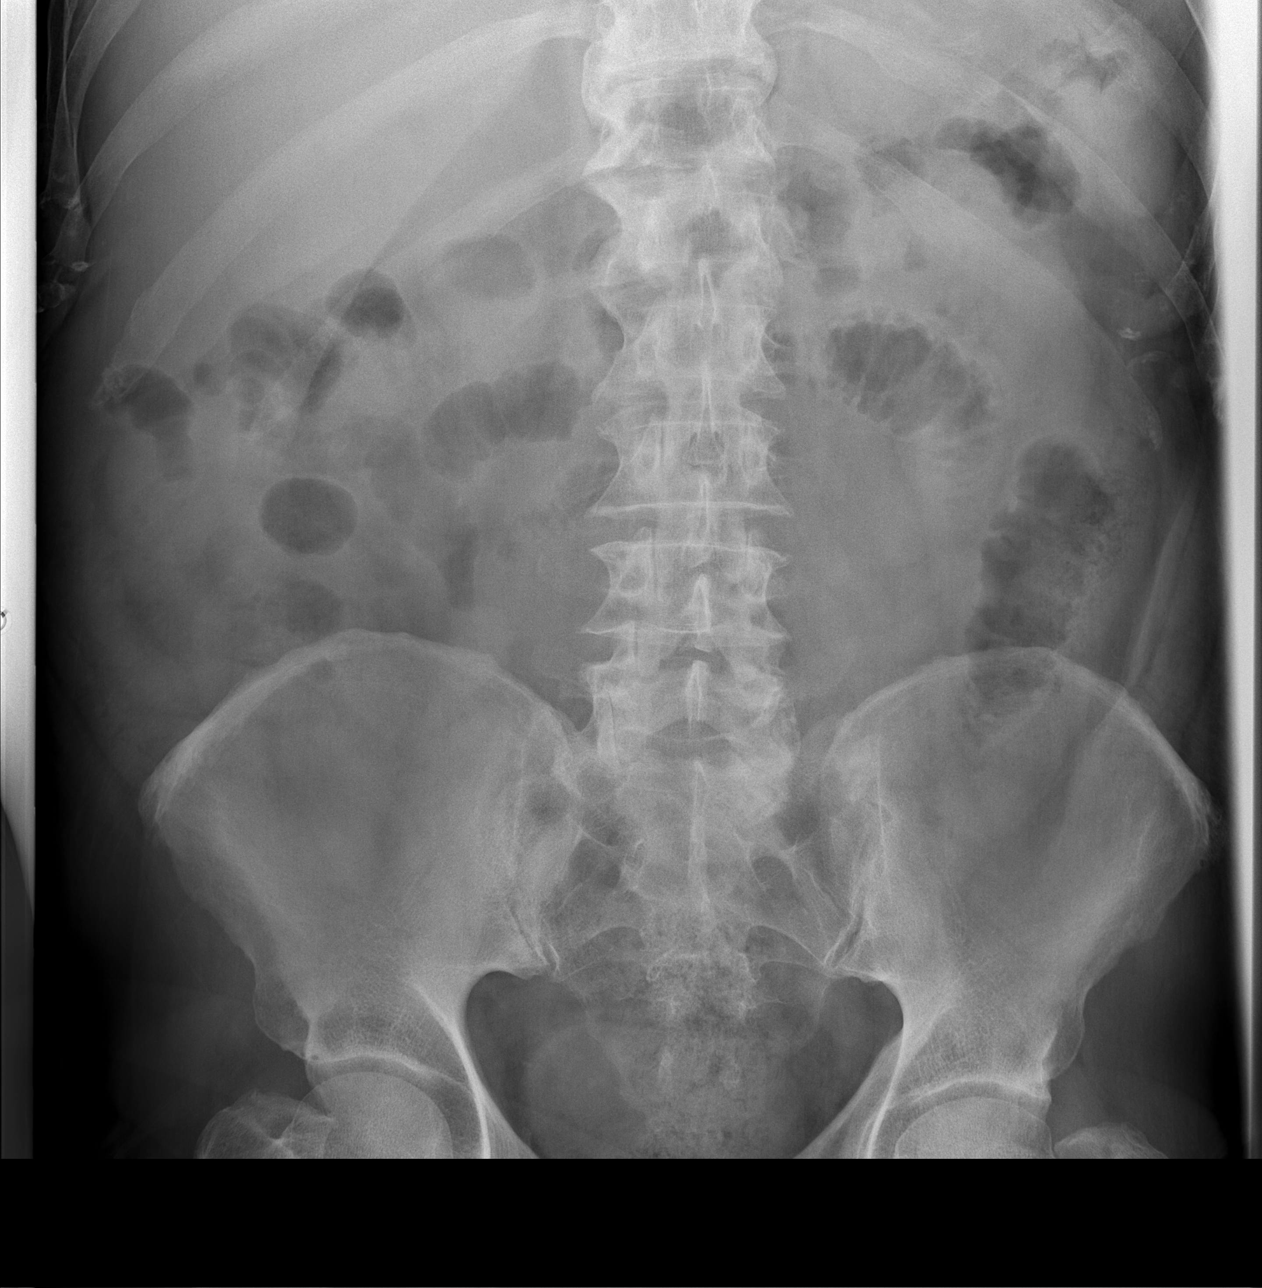

[3 of 3 positions shown; findings below may reference images not displayed]

FINDINGS: Resolution of left pneumothorax. There is a small to moderate left
pleural effusion with associated left basilar atelectasis. The heart
size is normal. No pulmonary edema identified.

Abdominal films show no evidence of bowel obstruction or ileus. No
significant retained fecal material. No abnormal calcifications.
Mild degenerative changes are present throughout the lumbar spine.
IMPRESSION: No evidence of acute bowel obstruction or ileus. Resolution of left
pneumothorax postoperatively with small a moderate left pleural
effusion present.

## 2015-12-19 DIAGNOSIS — K4020 Bilateral inguinal hernia, without obstruction or gangrene, not specified as recurrent: Secondary | ICD-10-CM | POA: Diagnosis not present

## 2015-12-25 ENCOUNTER — Other Ambulatory Visit: Payer: Self-pay | Admitting: *Deleted

## 2015-12-25 DIAGNOSIS — I10 Essential (primary) hypertension: Secondary | ICD-10-CM

## 2015-12-25 MED ORDER — AMLODIPINE BESYLATE 10 MG PO TABS: 10.0000 mg | ORAL_TABLET | Freq: Every day | ORAL | Status: DC

## 2016-01-11 ENCOUNTER — Ambulatory Visit (INDEPENDENT_AMBULATORY_CARE_PROVIDER_SITE_OTHER): Payer: BLUE CROSS/BLUE SHIELD | Admitting: Cardiology

## 2016-01-11 ENCOUNTER — Encounter: Payer: Self-pay | Admitting: Cardiology

## 2016-01-11 VITALS — BP 116/68 | HR 58 | Ht 67.0 in | Wt 192.1 lb

## 2016-01-11 DIAGNOSIS — E785 Hyperlipidemia, unspecified: Secondary | ICD-10-CM | POA: Diagnosis not present

## 2016-01-11 DIAGNOSIS — I10 Essential (primary) hypertension: Secondary | ICD-10-CM | POA: Diagnosis not present

## 2016-01-11 DIAGNOSIS — K4020 Bilateral inguinal hernia, without obstruction or gangrene, not specified as recurrent: Secondary | ICD-10-CM | POA: Diagnosis not present

## 2016-01-11 MED ORDER — METOPROLOL TARTRATE 25 MG PO TABS: 25.0000 mg | ORAL_TABLET | Freq: Two times a day (BID) | ORAL | Status: DC

## 2016-01-11 MED ORDER — PRAVASTATIN SODIUM 40 MG PO TABS: 40.0000 mg | ORAL_TABLET | Freq: Every evening | ORAL | Status: DC

## 2016-01-11 MED ORDER — LISINOPRIL 20 MG PO TABS: 20.0000 mg | ORAL_TABLET | Freq: Every day | ORAL | Status: DC

## 2016-01-11 MED ORDER — AMLODIPINE BESYLATE 10 MG PO TABS: 10.0000 mg | ORAL_TABLET | Freq: Every day | ORAL | Status: DC

## 2016-01-11 MED ORDER — FENOFIBRATE 160 MG PO TABS: 160.0000 mg | ORAL_TABLET | Freq: Every day | ORAL | Status: DC

## 2016-01-11 NOTE — Patient Instructions (Signed)
Your physician wants you to follow-up in: 1 Year. You will receive a reminder letter in the mail two months in advance. If you don't receive a letter, please call our office to schedule the follow-up appointment.  Your physician has recommended you make the following change in your medication: START Pravastatin 40 mg daily and DECREASE Aspirin 81 mg daily

## 2016-01-11 NOTE — Progress Notes (Signed)
HPI The patient returns for yearly  followup after CABG. He says he is doing well. Since I last saw him he's had some mild soreness in his left upper chest which has been there since his surgery. He otherwise is not having any other complaints he had prior to surgery. He does stay tired and at times he has episodes of dizziness in the morning after taking his meds.  He eats some crackers and this goes away.   He denies any palpitations, presyncope or syncope. He denies any shortness of breath, PND or orthopnea. He has had no weight gain or edema.    No Known Allergies  Current Outpatient Prescriptions  Medication Sig Dispense Refill  . ALPRAZolam (XANAX) 0.5 MG tablet TAKE ONE TABLET BY MOUTH TWICE DAILY AS NEEDED 60 tablet 0  . amLODipine (NORVASC) 10 MG tablet Take 1 tablet (10 mg total) by mouth daily. 30 tablet 0  . aspirin EC 325 MG EC tablet Take 1 tablet (325 mg total) by mouth daily.    . cyclobenzaprine (FLEXERIL) 10 MG tablet TAKE ONE TABLET BY MOUTH 3 TIMES DAILY AS NEEDED FOR MUSCLE SPASMS 30 tablet 1  . fenofibrate 160 MG tablet Take 1 tablet (160 mg total) by mouth daily. 90 tablet 2  . fluticasone (FLONASE) 50 MCG/ACT nasal spray Place 2 sprays into both nostrils daily. 16 g 6  . ibuprofen (ADVIL,MOTRIN) 800 MG tablet 1 po TID prn 90 tablet 3  . lisinopril (PRINIVIL,ZESTRIL) 20 MG tablet Take 20 mg twice a day 60 tablet 11  . metoprolol tartrate (LOPRESSOR) 25 MG tablet Take 1 tablet (25 mg total) by mouth 2 (two) times daily. 180 tablet 2  . sildenafil (VIAGRA) 100 MG tablet Take 0.5-1 tablets (50-100 mg total) by mouth daily as needed for erectile dysfunction. 5 tablet 11   No current facility-administered medications for this visit.    Past Medical History  Diagnosis Date  . Hypertension   . Hyperlipemia   . Coronary artery disease   . Tobacco abuse     Past Surgical History  Procedure Laterality Date  . Knee surgery      bilateral arthroscopic  . Coronary  artery bypass graft N/A 06/17/2014    Procedure: CORONARY ARTERY BYPASS GRAFTING (CABG) X 4, RIGHT LEG EVH;  Surgeon: Gaye Pollack, MD;  Location: [facility];  Service: Open Heart Surgery;  Laterality: N/A;  . Tee without cardioversion N/A 06/17/2014    Procedure: TRANSESOPHAGEAL ECHOCARDIOGRAM (TEE);  Surgeon: Gaye Pollack, MD;  Location: [facility];  Service: Open Heart Surgery;  Laterality: N/A;  . Left heart catheterization with coronary angiogram N/A 06/16/2014    Procedure: LEFT HEART CATHETERIZATION WITH CORONARY ANGIOGRAM;  Surgeon: Burnell Blanks, MD;  Location: [facility] CATH LAB;  Service: Cardiovascular;  Laterality: N/A;    ROS:  As stated in the HPI and negative for all other systems.  PHYSICAL EXAM BP 116/68 mmHg  Pulse 58  Ht 5\' 7"  (1.702 m)  Wt 192 lb 2 oz (87.147 kg)  BMI 30.08 kg/m2 GENERAL:  Well appearing HEENT:  Pupils equal round and reactive, fundi not visualized, oral mucosa unremarkable NECK:  No jugular venous distention, waveform within normal limits, carotid upstroke brisk and symmetric, no bruits, no thyromegaly LYMPHATICS:  No cervical, inguinal adenopathy LUNGS:  Clear to auscultation bilaterally BACK:  No CVA tenderness CHEST:  Well healed sternotomy scar. HEART:  PMI not displaced or sustained,S1 and S2 within normal limits, no S3, no S4,  no clicks, no rubs, no  murmurs ABD:  Flat, positive bowel sounds normal in frequency in pitch, no bruits, no rebound, no guarding, no midline pulsatile mass, no hepatomegaly, no splenomegaly EXT:  2 plus pulses throughout, mild right ankle edema, no cyanosis no clubbing  EKG:  Sinus rhythm, rate 58, axis within normal limits, intervals within normal limits, no acute ST-T wave changes.  01/11/2016  ASSESSMENT AND PLAN  CAD/CABG:   The patient is now status post CABG.    No further cardiovascular testing is suggested.   TOBACCO: He has not smoked since Easter!  DYSLIPIDEMIA:   He has tried simvastatin on a couple of  occasions and have muscle aches with his hands cramping. He agrees to try pravastatin and will have a follow-up lipid profile if he can remain on this.  Lab Results  Component Value Date   CHOL 216* 07/04/2015   TRIG 158* 07/04/2015   HDL 49 07/04/2015   LDLCALC 135* 07/04/2015    HTN:   I asked him to keep a blood pressure diary and to check his blood pressure when he's feeling dizzy to see if he has any relationship to high or low blood pressures. Is actually only taking his lisinopril once daily and he can continue on this.

## 2016-02-05 DIAGNOSIS — S83411A Sprain of medial collateral ligament of right knee, initial encounter: Secondary | ICD-10-CM | POA: Diagnosis not present

## 2016-02-06 DIAGNOSIS — M25561 Pain in right knee: Secondary | ICD-10-CM | POA: Diagnosis not present

## 2016-03-28 ENCOUNTER — Other Ambulatory Visit: Payer: Self-pay

## 2016-03-28 DIAGNOSIS — I10 Essential (primary) hypertension: Secondary | ICD-10-CM

## 2016-03-28 MED ORDER — AMLODIPINE BESYLATE 10 MG PO TABS: 10.0000 mg | ORAL_TABLET | Freq: Every day | ORAL | Status: DC

## 2016-04-29 ENCOUNTER — Other Ambulatory Visit: Payer: Self-pay | Admitting: *Deleted

## 2016-04-29 DIAGNOSIS — I10 Essential (primary) hypertension: Secondary | ICD-10-CM

## 2016-04-29 MED ORDER — AMLODIPINE BESYLATE 10 MG PO TABS: 10.0000 mg | ORAL_TABLET | Freq: Every day | ORAL | 2 refills | Status: DC

## 2016-07-05 DIAGNOSIS — J069 Acute upper respiratory infection, unspecified: Secondary | ICD-10-CM | POA: Diagnosis not present

## 2016-07-05 DIAGNOSIS — L237 Allergic contact dermatitis due to plants, except food: Secondary | ICD-10-CM | POA: Diagnosis not present

## 2016-08-27 DIAGNOSIS — J0110 Acute frontal sinusitis, unspecified: Secondary | ICD-10-CM | POA: Diagnosis not present

## 2016-09-11 ENCOUNTER — Other Ambulatory Visit: Payer: Self-pay

## 2016-09-11 MED ORDER — LISINOPRIL 20 MG PO TABS: 20.0000 mg | ORAL_TABLET | Freq: Every day | ORAL | 6 refills | Status: DC

## 2016-09-17 ENCOUNTER — Encounter: Payer: Self-pay | Admitting: Family Medicine

## 2016-09-17 ENCOUNTER — Ambulatory Visit (INDEPENDENT_AMBULATORY_CARE_PROVIDER_SITE_OTHER): Payer: BLUE CROSS/BLUE SHIELD | Admitting: Family Medicine

## 2016-09-17 ENCOUNTER — Telehealth: Payer: Self-pay | Admitting: Nurse Practitioner

## 2016-09-17 VITALS — BP 124/75 | HR 68 | Temp 98.8°F | Wt 187.1 lb

## 2016-09-17 DIAGNOSIS — J101 Influenza due to other identified influenza virus with other respiratory manifestations: Secondary | ICD-10-CM

## 2016-09-17 LAB — VERITOR FLU A/B WAIVED
INFLUENZA B: NEGATIVE
Influenza A: POSITIVE — AB

## 2016-09-17 MED ORDER — OSELTAMIVIR PHOSPHATE 75 MG PO CAPS: 75.0000 mg | ORAL_CAPSULE | Freq: Two times a day (BID) | ORAL | 0 refills | Status: DC

## 2016-09-17 NOTE — Telephone Encounter (Signed)
appt made to be seen  

## 2016-09-17 NOTE — Patient Instructions (Signed)
Great to see you!  Try tylenol for fevers, start tamiflu as soon as possible.   Get plenty of fluids   Influenza, Adult Influenza, more commonly known as "the flu," is a viral infection that primarily affects the respiratory tract. The respiratory tract includes organs that help you breathe, such as the lungs, nose, and throat. The flu causes many common cold symptoms, as well as a high fever and body aches. The flu spreads easily from person to person (is contagious). Getting a flu shot (influenza vaccination) every year is the best way to prevent influenza. What are the causes? Influenza is caused by a virus. You can catch the virus by:  Breathing in droplets from an infected person's cough or sneeze.  Touching something that was recently contaminated with the virus and then touching your mouth, nose, or eyes. What increases the risk? The following factors may make you more likely to get the flu:  Not cleaning your hands frequently with soap and water or alcohol-based hand sanitizer.  Having close contact with many people during cold and flu season.  Touching your mouth, eyes, or nose without washing or sanitizing your hands first.  Not drinking enough fluids or not eating a healthy diet.  Not getting enough sleep or exercise.  Being under a high amount of stress.  Not getting a yearly (annual) flu shot. You may be at a higher risk of complications from the flu, such as a severe lung infection (pneumonia), if you:  Are over the age of 16.  Are pregnant.  Have a weakened disease-fighting system (immune system). You may have a weakened immune system if you:  Have HIV or AIDS.  Are undergoing chemotherapy.  Aretaking medicines that reduce the activity of (suppress) the immune system.  Have a long-term (chronic) illness, such as heart disease, kidney disease, diabetes, or lung disease.  Have a liver disorder.  Are obese.  Have anemia. What are the signs or  symptoms? Symptoms of this condition typically last 4-10 days and may include:  Fever.  Chills.  Headache, body aches, or muscle aches.  Sore throat.  Cough.  Runny or congested nose.  Chest discomfort and cough.  Poor appetite.  Weakness or tiredness (fatigue).  Dizziness.  Nausea or vomiting. How is this diagnosed? This condition may be diagnosed based on your medical history and a physical exam. Your health care provider may do a nose or throat swab test to confirm the diagnosis. How is this treated? If influenza is detected early, you can be treated with antiviral medicine that can reduce the length of your illness and the severity of your symptoms. This medicine may be given by mouth (orally) or through an IV tube that is inserted in one of your veins. The goal of treatment is to relieve symptoms by taking care of yourself at home. This may include taking over-the-counter medicines, drinking plenty of fluids, and adding humidity to the air in your home. In some cases, influenza goes away on its own. Severe influenza or complications from influenza may be treated in a hospital. Follow these instructions at home:  Take over-the-counter and prescription medicines only as told by your health care provider.  Use a cool mist humidifier to add humidity to the air in your home. This can make breathing easier.  Rest as needed.  Drink enough fluid to keep your urine clear or pale yellow.  Cover your mouth and nose when you cough or sneeze.  Wash your hands with soap  and water often, especially after you cough or sneeze. If soap and water are not available, use hand sanitizer.  Stay home from work or school as told by your health care provider. Unless you are visiting your health care provider, try to avoid leaving home until your fever has been gone for 24 hours without the use of medicine.  Keep all follow-up visits as told by your health care provider. This is  important. How is this prevented?  Getting an annual flu shot is the best way to avoid getting the flu. You may get the flu shot in late summer, fall, or winter. Ask your health care provider when you should get your flu shot.  Wash your hands often or use hand sanitizer often.  Avoid contact with people who are sick during cold and flu season.  Eat a healthy diet, drink plenty of fluids, get enough sleep, and exercise regularly. Contact a health care provider if:  You develop new symptoms.  You have:  Chest pain.  Diarrhea.  A fever.  Your cough gets worse.  You produce more mucus.  You feel nauseous or you vomit. Get help right away if:  You develop shortness of breath or difficulty breathing.  Your skin or nails turn a bluish color.  You have severe pain or stiffness in your neck.  You develop a sudden headache or sudden pain in your face or ear.  You cannot stop vomiting. This information is not intended to replace advice given to you by your health care provider. Make sure you discuss any questions you have with your health care provider. Document Released: 08/30/2000 Document Revised: 02/08/2016 Document Reviewed: 06/27/2015 Elsevier Interactive Patient Education  2017 [employer].

## 2016-09-17 NOTE — Progress Notes (Signed)
   HPI  Patient presents today with concern for influenza.  Patient has 3 days of illness including objective fever, chills, headache, runny nose, body aches, nasal congestion, and cough. He denies chest pain or shortness of breath. He's tolerating food and fluids normally.  He has been ill for about 3 days and went back to work today because he has been on vacation for a week.   PMH: Smoking status noted ROS: Per HPI  Objective: BP 124/75   Pulse 68   Temp 98.8 F (37.1 C) (Oral)   Wt 187 lb 2 oz (84.9 kg)   BMI 29.31 kg/m  Gen: NAD, alert, cooperative with exam HEENT: NCAT, nares clear, oropharynx moist and clear CV: RRR, good S1/S2, no murmur Resp: CTABL, no wheezes, non-labored Ext: No edema, warm Neuro: Alert and oriented, No gross deficits  Assessment and plan:  # Influenza a Influenza testing is positive tonight Treated with Tamiflu given cigarette smoker with over 30-pack-year history and coronary artery disease. Discussed supportive care and usual course of illness. One week written out of work, return to clinic with any concerns or worsening symptoms. Also return if symptoms are not improving as expected.     Orders Placed This Encounter  Procedures  . Veritor Flu A/B Waived    Order Specific Question:   Source    Answer:   nasal    Meds ordered this encounter  Medications  . oseltamivir (TAMIFLU) 75 MG capsule    Sig: Take 1 capsule (75 mg total) by mouth 2 (two) times daily.    Dispense:  10 capsule    Refill:  0    Laroy Apple, MD [place] 09/17/2016, 6:58 PM

## 2016-09-20 ENCOUNTER — Telehealth: Payer: Self-pay | Admitting: Nurse Practitioner

## 2016-09-20 NOTE — Telephone Encounter (Signed)
Patient seen Eric Saunders 09/17/16 and tested pos for influenza A. Patient states he has a terrible cough and would like rx sent to [facility]. Covering PCP, please advise.

## 2016-09-23 MED ORDER — BENZONATATE 100 MG PO CAPS: 100.0000 mg | ORAL_CAPSULE | Freq: Three times a day (TID) | ORAL | 0 refills | Status: DC

## 2016-09-23 NOTE — Telephone Encounter (Signed)
Can call in tessalon 200mg  1 perles TID for cough, #30.

## 2016-09-23 NOTE — Telephone Encounter (Signed)
Rx sent over to pharmacy and left detailed message stating rx for cough sent to the pharmacy and to call back with any further questions or concerns.

## 2016-09-24 DIAGNOSIS — S39011A Strain of muscle, fascia and tendon of abdomen, initial encounter: Secondary | ICD-10-CM | POA: Diagnosis not present

## 2017-01-02 ENCOUNTER — Encounter: Payer: Self-pay | Admitting: Nurse Practitioner

## 2017-01-02 ENCOUNTER — Ambulatory Visit (INDEPENDENT_AMBULATORY_CARE_PROVIDER_SITE_OTHER): Payer: BLUE CROSS/BLUE SHIELD | Admitting: Nurse Practitioner

## 2017-01-02 VITALS — BP 126/84 | HR 61 | Temp 97.6°F | Ht 67.0 in | Wt 191.0 lb

## 2017-01-02 DIAGNOSIS — E785 Hyperlipidemia, unspecified: Secondary | ICD-10-CM | POA: Diagnosis not present

## 2017-01-02 DIAGNOSIS — I10 Essential (primary) hypertension: Secondary | ICD-10-CM

## 2017-01-02 DIAGNOSIS — Z720 Tobacco use: Secondary | ICD-10-CM | POA: Diagnosis not present

## 2017-01-02 DIAGNOSIS — L03113 Cellulitis of right upper limb: Secondary | ICD-10-CM | POA: Diagnosis not present

## 2017-01-02 DIAGNOSIS — Z125 Encounter for screening for malignant neoplasm of prostate: Secondary | ICD-10-CM

## 2017-01-02 DIAGNOSIS — K5900 Constipation, unspecified: Secondary | ICD-10-CM

## 2017-01-02 DIAGNOSIS — Z1159 Encounter for screening for other viral diseases: Secondary | ICD-10-CM | POA: Diagnosis not present

## 2017-01-02 DIAGNOSIS — R6889 Other general symptoms and signs: Secondary | ICD-10-CM | POA: Diagnosis not present

## 2017-01-02 DIAGNOSIS — Z951 Presence of aortocoronary bypass graft: Secondary | ICD-10-CM

## 2017-01-02 DIAGNOSIS — F411 Generalized anxiety disorder: Secondary | ICD-10-CM | POA: Diagnosis not present

## 2017-01-02 MED ORDER — ALPRAZOLAM 0.5 MG PO TABS: 0.5000 mg | ORAL_TABLET | Freq: Two times a day (BID) | ORAL | 0 refills | Status: DC | PRN

## 2017-01-02 MED ORDER — ALPRAZOLAM 0.5 MG PO TABS
0.5000 mg | ORAL_TABLET | Freq: Two times a day (BID) | ORAL | 0 refills | Status: DC | PRN
Start: 2017-01-02 — End: 2017-05-22

## 2017-01-02 MED ORDER — IBUPROFEN 800 MG PO TABS: ORAL_TABLET | ORAL | 3 refills | Status: DC

## 2017-01-02 MED ORDER — LOSARTAN POTASSIUM 100 MG PO TABS: 100.0000 mg | ORAL_TABLET | Freq: Every day | ORAL | 1 refills | Status: DC

## 2017-01-02 NOTE — Addendum Note (Signed)
Addended by: [name] on: 01/02/2017 04:46 PM   Modules accepted: Orders

## 2017-01-02 NOTE — Progress Notes (Signed)
Subjective:    Patient ID: Eric Saunders, male    DOB: Dec 11, 1955, 61 y.o.   MRN: [id number]  HPI  OFFIE WAIDE is here today for follow up of chronic medical problem.  Outpatient Encounter Prescriptions as of 01/02/2017  Medication Sig  . ALPRAZolam (XANAX) 0.5 MG tablet TAKE ONE TABLET BY MOUTH TWICE DAILY AS NEEDED  . amLODipine (NORVASC) 10 MG tablet Take 1 tablet (10 mg total) by mouth daily.  Marland Kitchen aspirin 81 MG tablet Take 81 mg by mouth daily.  . cyclobenzaprine (FLEXERIL) 10 MG tablet TAKE ONE TABLET BY MOUTH 3 TIMES DAILY AS NEEDED FOR MUSCLE SPASMS  . fenofibrate 160 MG tablet Take 1 tablet (160 mg total) by mouth daily.  . fluticasone (FLONASE) 50 MCG/ACT nasal spray Place 2 sprays into both nostrils daily.  Marland Kitchen ibuprofen (ADVIL,MOTRIN) 800 MG tablet 1 po TID prn  . lisinopril (PRINIVIL,ZESTRIL) 20 MG tablet Take 1 tablet (20 mg total) by mouth daily.  . metoprolol tartrate (LOPRESSOR) 25 MG tablet Take 1 tablet (25 mg total) by mouth 2 (two) times daily.  . pravastatin (PRAVACHOL) 40 MG tablet Take 1 tablet (40 mg total) by mouth every evening.  . sildenafil (VIAGRA) 100 MG tablet Take 0.5-1 tablets (50-100 mg total) by mouth daily as needed for erectile dysfunction.     1. Constipation, unspecified constipation type  Has had for years- just treats with OTC meds  2. GAD (generalized anxiety disorder)  Has had anxiety his whole life- takes xanax occasionally  3. Hyperlipidemia with target LDL less than 100  Low fat diet not watched  4. Hypertension, poor control  Blood pressure was good at last visit- he does not check at home- No c/o chest pain,SOB or HA- lisinopril is causing a cough  5. S/P CABG x 4  Sees cardiology every 6 months- has occasional chest pain- cardiology aware  6. Tobacco abuse  Smoking 1 pack a day- has quit several times for several days but always seem to pick them back up.    New complaints: No new complaints     Review of Systems    Constitutional: Negative for diaphoresis.  Eyes: Negative for pain.  Respiratory: Negative for shortness of breath.   Cardiovascular: Negative for chest pain, palpitations and leg swelling.  Gastrointestinal: Negative for abdominal pain.  Endocrine: Negative for polydipsia.  Skin: Negative for rash.  Neurological: Negative for dizziness, weakness and headaches.  Hematological: Does not bruise/bleed easily.       Objective:   Physical Exam  Constitutional: He is oriented to person, place, and time. He appears well-developed and well-nourished.  HENT:  Head: Normocephalic.  Right Ear: External ear normal.  Left Ear: External ear normal.  Nose: Nose normal.  Mouth/Throat: Oropharynx is clear and moist.  Eyes: EOM are normal. Pupils are equal, round, and reactive to light.  Neck: Normal range of motion. Neck supple. No JVD present. No thyromegaly present.  Cardiovascular: Normal rate, regular rhythm, normal heart sounds and intact distal pulses.  Exam reveals no gallop and no friction rub.   No murmur heard. Pulmonary/Chest: Effort normal and breath sounds normal. No respiratory distress. He has no wheezes. He has no rales. He exhibits no tenderness.  Abdominal: Soft. Bowel sounds are normal. He exhibits no mass. There is no tenderness.  Genitourinary: Prostate normal and penis normal.  Musculoskeletal: Normal range of motion. He exhibits no edema.  Lymphadenopathy:    He has no cervical adenopathy.  Neurological: He  is alert and oriented to person, place, and time. No cranial nerve deficit.  Skin: Skin is warm and dry.  Psychiatric: He has a normal mood and affect. His behavior is normal. Judgment and thought content normal.   BP 126/84   Pulse 61   Temp 97.6 F (36.4 C) (Oral)   Ht '5\' 7"'  (1.702 m)   Wt 191 lb (86.6 kg)   BMI 29.91 kg/m      Assessment & Plan:  1. Constipation, unspecified constipation type miralax daily  2. GAD (generalized anxiety disorder) Stress  management - ALPRAZolam (XANAX) 0.5 MG tablet; Take 1 tablet (0.5 mg total) by mouth 2 (two) times daily as needed.  Dispense: 60 tablet; Refill: 0  3. Hyperlipidemia with target LDL less than 100 Low fat diet - Lipid panel  4. Essential hypertension Changed lisinopril to lorsartan due  To cough - losartan (COZAAR) 100 MG tablet; Take 1 tablet (100 mg total) by mouth daily.  Dispense: 90 tablet; Refill: 1 - CMP14+EGFR  5. S/P CABG x 4  6. Tobacco abuse Smoking cessation encouraged  - ibuprofen (ADVIL,MOTRIN) 800 MG tablet; 1 po TID prn  Dispense: 90 tablet; Refill: 3  8. Screening for prostate cancer - PSA, total and free    Labs pending Health maintenance reviewed Diet and exercise encouraged Continue all meds Follow up  In 3 months   [place], FNP

## 2017-01-03 LAB — CMP14+EGFR
ALBUMIN: 4.6 g/dL (ref 3.5–5.5)
ALT: 18 IU/L (ref 0–44)
AST: 14 IU/L (ref 0–40)
Albumin/Globulin Ratio: 1.6 (ref 1.2–2.2)
Alkaline Phosphatase: 43 IU/L (ref 39–117)
BUN / CREAT RATIO: 15 (ref 9–20)
BUN: 17 mg/dL (ref 6–24)
Bilirubin Total: 0.4 mg/dL (ref 0.0–1.2)
CALCIUM: 9.9 mg/dL (ref 8.7–10.2)
CO2: 24 mmol/L (ref 18–29)
Chloride: 99 mmol/L (ref 96–106)
Creatinine, Ser: 1.15 mg/dL (ref 0.76–1.27)
GFR calc non Af Amer: 70 mL/min/{1.73_m2} (ref >59)
GFR, EST AFRICAN AMERICAN: 81 mL/min/{1.73_m2} (ref >59)
GLUCOSE: 82 mg/dL (ref 65–99)
Globulin, Total: 2.8 g/dL (ref 1.5–4.5)
Potassium: 4 mmol/L (ref 3.5–5.2)
Sodium: 141 mmol/L (ref 134–144)
TOTAL PROTEIN: 7.4 g/dL (ref 6.0–8.5)

## 2017-01-03 LAB — LIPID PANEL
Chol/HDL Ratio: 4.8 ratio (ref 0.0–5.0)
Cholesterol, Total: 220 mg/dL — ABNORMAL HIGH (ref 100–199)
HDL: 46 mg/dL (ref >39)
LDL CALC: 134 mg/dL — AB (ref 0–99)
Triglycerides: 202 mg/dL — ABNORMAL HIGH (ref 0–149)
VLDL CHOLESTEROL CAL: 40 mg/dL (ref 5–40)

## 2017-01-03 LAB — PSA, TOTAL AND FREE
PROSTATE SPECIFIC AG, SERUM: 2.2 ng/mL (ref 0.0–4.0)
PSA FREE: 0.42 ng/mL
PSA, Free Pct: 19.1 %

## 2017-01-03 LAB — HEPATITIS C ANTIBODY: Hep C Virus Ab: <0.1 s/co ratio (ref 0.0–0.9)

## 2017-01-14 ENCOUNTER — Ambulatory Visit: Payer: BLUE CROSS/BLUE SHIELD | Admitting: Cardiology

## 2017-01-15 NOTE — Progress Notes (Signed)
HPI The patient returns for yearly  followup after CABG. He has a very active job.  The patient denies any new symptoms such as chest discomfort, neck or arm discomfort. There has been no new shortness of breath, PND or orthopnea. There have been no reported palpitations, presyncope or syncope.  He rarely gets some substernal sharp pain.  He has started smoking again.  He was not taking his Pravachol but started when his lipids came back very elevated.    Allergies  Allergen Reactions  . Ace Inhibitors Cough    Current Outpatient Prescriptions  Medication Sig Dispense Refill  . ALPRAZolam (XANAX) 0.5 MG tablet Take 1 tablet (0.5 mg total) by mouth 2 (two) times daily as needed. 60 tablet 0  . amLODipine (NORVASC) 10 MG tablet Take 1 tablet (10 mg total) by mouth daily. 90 tablet 3  . aspirin 81 MG tablet Take 81 mg by mouth daily.    . cyclobenzaprine (FLEXERIL) 10 MG tablet TAKE ONE TABLET BY MOUTH 3 TIMES DAILY AS NEEDED FOR MUSCLE SPASMS 30 tablet 1  . fenofibrate 160 MG tablet Take 1 tablet (160 mg total) by mouth daily. 90 tablet 3  . fluticasone (FLONASE) 50 MCG/ACT nasal spray Place 2 sprays into both nostrils daily. 16 g 6  . ibuprofen (ADVIL,MOTRIN) 800 MG tablet 1 po TID prn 90 tablet 3  . losartan (COZAAR) 100 MG tablet Take 1 tablet (100 mg total) by mouth daily. 90 tablet 1  . pravastatin (PRAVACHOL) 40 MG tablet Take 1 tablet (40 mg total) by mouth every evening. 90 tablet 3  . sildenafil (VIAGRA) 100 MG tablet Take 0.5-1 tablets (50-100 mg total) by mouth daily as needed for erectile dysfunction. 5 tablet 11  . metoprolol succinate (TOPROL XL) 25 MG 24 hr tablet Take 1 tablet (25 mg total) by mouth daily. 90 tablet 3   No current facility-administered medications for this visit.     Past Medical History:  Diagnosis Date  . Coronary artery disease   . Hyperlipemia   . Hypertension   . Tobacco abuse     Past Surgical History:  Procedure Laterality Date  . CORONARY  ARTERY BYPASS GRAFT N/A 06/17/2014   Procedure: CORONARY ARTERY BYPASS GRAFTING (CABG) X 4, RIGHT LEG EVH;  Surgeon: Gaye Pollack, MD;  Location: [facility];  Service: Open Heart Surgery;  Laterality: N/A;  . KNEE SURGERY     bilateral arthroscopic  . LEFT HEART CATHETERIZATION WITH CORONARY ANGIOGRAM N/A 06/16/2014   Procedure: LEFT HEART CATHETERIZATION WITH CORONARY ANGIOGRAM;  Surgeon: Burnell Blanks, MD;  Location: [facility] CATH LAB;  Service: Cardiovascular;  Laterality: N/A;  . TEE WITHOUT CARDIOVERSION N/A 06/17/2014   Procedure: TRANSESOPHAGEAL ECHOCARDIOGRAM (TEE);  Surgeon: Gaye Pollack, MD;  Location: [place];  Service: Open Heart Surgery;  Laterality: N/A;    ROS:    As stated in the HPI and negative for all other systems.  PHYSICAL EXAM BP 118/80   Pulse (!) 56   Ht 5\' 7"  (1.702 m)   Wt 188 lb (85.3 kg)   BMI 29.44 kg/m  GENERAL:  Well appearing, and no distress HEENT:  Pupils equal round and reactive, fundi not visualized, oral mucosa unremarkable NECK:  No jugular venous distention, waveform within normal limits, carotid upstroke brisk and symmetric, no bruits, no thyromegaly LUNGS:  Clear to auscultation bilaterally BACK:  No CVA tenderness CHEST:  Well healed sternotomy scar. HEART:  PMI not displaced or sustained,S1 and S2 within  normal limits, no S3, no S4, no clicks, no rubs, no  murmurs ABD:  Flat, positive bowel sounds normal in frequency in pitch, no bruits, no rebound, no guarding, no midline pulsatile mass, no hepatomegaly, no splenomegaly EXT:  2 plus pulses throughout, mild right ankle edema, no cyanosis no clubbing  Lab Results  Component Value Date   CHOL 220 (H) 01/02/2017   TRIG 202 (H) 01/02/2017   HDL 46 01/02/2017   LDLCALC 134 (H) 01/02/2017    EKG:  Sinus rhythm, rate 56, axis within normal limits, intervals within normal limits, no acute ST-T wave changes.  01/16/2017  ASSESSMENT AND PLAN  CAD/CABG:     The patient is now status post CABG.     No further cardiovascular testing is suggested.   TOBACCO:   He promises again to try to stop smoking.  DYSLIPIDEMIA:    He had lipids as above and now has started back on Pravachol which is the only thing that he will try.  He will get another lipid in 3 months.    HTN:  The blood pressure is at target. No change in medications is indicated. We will continue with therapeutic lifestyle changes (TLC).

## 2017-01-16 ENCOUNTER — Ambulatory Visit (INDEPENDENT_AMBULATORY_CARE_PROVIDER_SITE_OTHER): Payer: BLUE CROSS/BLUE SHIELD | Admitting: Cardiology

## 2017-01-16 ENCOUNTER — Encounter: Payer: Self-pay | Admitting: Cardiology

## 2017-01-16 VITALS — BP 118/80 | HR 56 | Ht 67.0 in | Wt 188.0 lb

## 2017-01-16 DIAGNOSIS — I2510 Atherosclerotic heart disease of native coronary artery without angina pectoris: Secondary | ICD-10-CM | POA: Diagnosis not present

## 2017-01-16 DIAGNOSIS — I10 Essential (primary) hypertension: Secondary | ICD-10-CM | POA: Diagnosis not present

## 2017-01-16 DIAGNOSIS — E785 Hyperlipidemia, unspecified: Secondary | ICD-10-CM | POA: Diagnosis not present

## 2017-01-16 MED ORDER — FENOFIBRATE 160 MG PO TABS: 160.0000 mg | ORAL_TABLET | Freq: Every day | ORAL | 3 refills | Status: DC

## 2017-01-16 MED ORDER — PRAVASTATIN SODIUM 40 MG PO TABS: 40.0000 mg | ORAL_TABLET | Freq: Every evening | ORAL | 3 refills | Status: DC

## 2017-01-16 MED ORDER — AMLODIPINE BESYLATE 10 MG PO TABS: 10.0000 mg | ORAL_TABLET | Freq: Every day | ORAL | 3 refills | Status: DC

## 2017-01-16 MED ORDER — METOPROLOL SUCCINATE ER 25 MG PO TB24: 25.0000 mg | ORAL_TABLET | Freq: Every day | ORAL | 3 refills | Status: DC

## 2017-01-16 NOTE — Patient Instructions (Signed)
Medication Instructions:  STOP- Lopressor(Metoprolol Tart) START- Metoprolol Succinate 25 mg daily  Labwork: None Ordered  Testing/Procedures: None Ordered  Follow-Up: Your physician wants you to follow-up in: 1 Year. You will receive a reminder letter in the mail two months in advance. If you don't receive a letter, please call our office to schedule the follow-up appointment.  Any Other Special Instructions Will Be Listed Below (If Applicable).   If you need a refill on your cardiac medications before your next appointment, please call your pharmacy.

## 2017-04-16 ENCOUNTER — Other Ambulatory Visit: Payer: Self-pay | Admitting: Nurse Practitioner

## 2017-04-16 DIAGNOSIS — L03113 Cellulitis of right upper limb: Secondary | ICD-10-CM

## 2017-05-22 ENCOUNTER — Ambulatory Visit (INDEPENDENT_AMBULATORY_CARE_PROVIDER_SITE_OTHER): Payer: BLUE CROSS/BLUE SHIELD | Admitting: Nurse Practitioner

## 2017-05-22 ENCOUNTER — Encounter: Payer: Self-pay | Admitting: Nurse Practitioner

## 2017-05-22 VITALS — BP 152/86 | HR 55 | Temp 97.3°F | Ht 67.0 in | Wt 186.0 lb

## 2017-05-22 DIAGNOSIS — I25810 Atherosclerosis of coronary artery bypass graft(s) without angina pectoris: Secondary | ICD-10-CM

## 2017-05-22 DIAGNOSIS — F411 Generalized anxiety disorder: Secondary | ICD-10-CM

## 2017-05-22 DIAGNOSIS — I200 Unstable angina: Secondary | ICD-10-CM

## 2017-05-22 DIAGNOSIS — R5383 Other fatigue: Secondary | ICD-10-CM

## 2017-05-22 DIAGNOSIS — I10 Essential (primary) hypertension: Secondary | ICD-10-CM | POA: Diagnosis not present

## 2017-05-22 DIAGNOSIS — K5900 Constipation, unspecified: Secondary | ICD-10-CM

## 2017-05-22 DIAGNOSIS — E785 Hyperlipidemia, unspecified: Secondary | ICD-10-CM

## 2017-05-22 DIAGNOSIS — Z720 Tobacco use: Secondary | ICD-10-CM | POA: Diagnosis not present

## 2017-05-22 MED ORDER — METOPROLOL SUCCINATE ER 25 MG PO TB24: 25.0000 mg | ORAL_TABLET | Freq: Every day | ORAL | 3 refills | Status: DC

## 2017-05-22 MED ORDER — PRAVASTATIN SODIUM 40 MG PO TABS: 40.0000 mg | ORAL_TABLET | Freq: Every evening | ORAL | 3 refills | Status: DC

## 2017-05-22 MED ORDER — FENOFIBRATE 160 MG PO TABS: 160.0000 mg | ORAL_TABLET | Freq: Every day | ORAL | 3 refills | Status: DC

## 2017-05-22 MED ORDER — AMLODIPINE BESYLATE 10 MG PO TABS: 10.0000 mg | ORAL_TABLET | Freq: Every day | ORAL | 3 refills | Status: DC

## 2017-05-22 MED ORDER — LOSARTAN POTASSIUM 100 MG PO TABS: 100.0000 mg | ORAL_TABLET | Freq: Every day | ORAL | 1 refills | Status: DC

## 2017-05-22 MED ORDER — ALPRAZOLAM 0.5 MG PO TABS: 0.5000 mg | ORAL_TABLET | Freq: Two times a day (BID) | ORAL | 0 refills | Status: DC | PRN

## 2017-05-22 NOTE — Patient Instructions (Signed)

## 2017-05-22 NOTE — Progress Notes (Signed)
Subjective:    Patient ID: Eric Saunders, male    DOB: 1955-10-24, 61 y.o.   MRN: [id number]  HPI  Eric Saunders is here today for follow up of chronic medical problem.  Outpatient Encounter Prescriptions as of 05/22/2017  Medication Sig  . ALPRAZolam (XANAX) 0.5 MG tablet Take 1 tablet (0.5 mg total) by mouth 2 (two) times daily as needed.  Marland Kitchen amLODipine (NORVASC) 10 MG tablet Take 1 tablet (10 mg total) by mouth daily.  Marland Kitchen aspirin 81 MG tablet Take 81 mg by mouth daily.  . cyclobenzaprine (FLEXERIL) 10 MG tablet TAKE ONE TABLET BY MOUTH 3 TIMES DAILY AS NEEDED FOR MUSCLE SPASMS  . fenofibrate 160 MG tablet Take 1 tablet (160 mg total) by mouth daily.  . fluticasone (FLONASE) 50 MCG/ACT nasal spray Place 2 sprays into both nostrils daily.  . IBU 800 MG tablet TAKE ONE TABLET BY MOUTH 3 TIMES DAILY AS NEEDED  . losartan (COZAAR) 100 MG tablet Take 1 tablet (100 mg total) by mouth daily.  . metoprolol succinate (TOPROL XL) 25 MG 24 hr tablet Take 1 tablet (25 mg total) by mouth daily.  . pravastatin (PRAVACHOL) 40 MG tablet Take 1 tablet (40 mg total) by mouth every evening.  . sildenafil (VIAGRA) 100 MG tablet Take 0.5-1 tablets (50-100 mg total) by mouth daily as needed for erectile dysfunction.   No facility-administered encounter medications on file as of 05/22/2017.     1. Unstable angina (HCC)  No recent chest pain  2. Constipation, unspecified constipation type  Uses OTC meds which help  3. Hypertension, poor control  No c/o chest pain, sob or heaache. Does not check blood pressure at home  4. Hyperlipidemia with target LDL less than 100  Does not watch diet  5. GAD (generalized anxiety disorder)  Takes xanax daily- helps control anxiety  6. Tobacco abuse  No desire to quit smoking  7. S/P CABG x 4  Doing well- saw DR. Hochrien on 01/16/17    New complaints: He complains of constant fatigue- says it seems to be worsening- he is able to work but as soon as he gets off he  does not have energy to di anything. He spoke to cardiologist about this and he said heart is fine.  Social history: Lives with wife- has a grandchild that is the love of his life.    Review of Systems  Constitutional: Negative for activity change and appetite change.  HENT: Negative.   Eyes: Negative for pain.  Respiratory: Negative for shortness of breath.   Cardiovascular: Negative for chest pain, palpitations and leg swelling.  Gastrointestinal: Negative for abdominal pain.  Endocrine: Negative for polydipsia.  Genitourinary: Negative.   Skin: Negative for rash.  Neurological: Negative for dizziness, weakness and headaches.  Hematological: Does not bruise/bleed easily.  Psychiatric/Behavioral: Negative.   All other systems reviewed and are negative.      Objective:   Physical Exam  Constitutional: He is oriented to person, place, and time. He appears well-developed and well-nourished.  HENT:  Head: Normocephalic.  Right Ear: External ear normal.  Left Ear: External ear normal.  Nose: Nose normal.  Mouth/Throat: Oropharynx is clear and moist.  Eyes: Pupils are equal, round, and reactive to light. EOM are normal.  Neck: Normal range of motion. Neck supple. No JVD present. No thyromegaly present.  Cardiovascular: Normal rate, regular rhythm, normal heart sounds and intact distal pulses.  Exam reveals no gallop and no friction rub.  No murmur heard. Pulmonary/Chest: Effort normal and breath sounds normal. No respiratory distress. He has no wheezes. He has no rales. He exhibits no tenderness.  Abdominal: Soft. Bowel sounds are normal. He exhibits no mass. There is no tenderness.  Genitourinary: Prostate normal and penis normal.  Musculoskeletal: Normal range of motion. He exhibits no edema.  Lymphadenopathy:    He has no cervical adenopathy.  Neurological: He is alert and oriented to person, place, and time. No cranial nerve deficit.  Skin: Skin is warm and dry.    Psychiatric: He has a normal mood and affect. His behavior is normal. Judgment and thought content normal.   BP (!) 152/86 (BP Location: Left Arm, Cuff Size: Normal)   Pulse (!) 55   Temp (!) 97.3 F (36.3 C) (Oral)   Ht _0  (1.702 m)   Wt 186 lb (84.4 kg)   BMI 29.13 kg/m         Assessment & Plan:  1. Unstable angina ([place])  2. Constipation, unspecified constipation type miralax OTC as needed  3. Hypertension, poor control Low sodium diet - amLODipine (NORVASC) 10 MG tablet; Take 1 tablet (10 mg total) by mouth daily.  Dispense: 90 tablet; Refill: 3 - CMP14+EGFR  4. Hyperlipidemia with target LDL less than 100 Low fat diet - fenofibrate 160 MG tablet; Take 1 tablet (160 mg total) by mouth daily.  Dispense: 90 tablet; Refill: 3 - pravastatin (PRAVACHOL) 40 MG tablet; Take 1 tablet (40 mg total) by mouth every evening.  Dispense: 90 tablet; Refill: 3 - Lipid panel  5. GAD (generalized anxiety disorder) Stress management - ALPRAZolam (XANAX) 0.5 MG tablet; Take 1 tablet (0.5 mg total) by mouth 2 (two) times daily as needed.  Dispense: 60 tablet; Refill: 0  6. Tobacco abuse Smoking cessation encouraged  7. Coronary artery disease involving autologous vein coronary bypass graft without angina pectoris - metoprolol succinate (TOPROL XL) 25 MG 24 hr tablet; Take 1 tablet (25 mg total) by mouth daily.  Dispense: 90 tablet; Refill: 3  8. Essential hypertension Low sodium diet - losartan (COZAAR) 100 MG tablet; Take 1 tablet (100 mg total) by mouth daily.  Dispense: 90 tablet; Refill: 1  9. Fatigue, unspecified type Labs pending - Anemia Profile B - Testosterone,Free and Total - Thyroid Panel With TSH - Lyme Ab/Western Blot Reflex    Labs pending Health maintenance reviewed Diet and exercise encouraged Continue all meds Follow up  In 6 months   [place], FNP

## 2017-05-23 LAB — CMP14+EGFR
A/G RATIO: 1.8 (ref 1.2–2.2)
ALBUMIN: 4.8 g/dL (ref 3.5–5.5)
ALK PHOS: 45 IU/L (ref 39–117)
ALT: 14 IU/L (ref 0–44)
AST: 15 IU/L (ref 0–40)
BILIRUBIN TOTAL: 0.5 mg/dL (ref 0.0–1.2)
BUN/Creatinine Ratio: 18 (ref 9–20)
BUN: 19 mg/dL (ref 6–24)
CHLORIDE: 102 mmol/L (ref 96–106)
CO2: 22 mmol/L (ref 20–29)
Calcium: 10.2 mg/dL (ref 8.7–10.2)
Creatinine, Ser: 1.07 mg/dL (ref 0.76–1.27)
GFR calc non Af Amer: 76 mL/min/{1.73_m2} (ref >59)
GFR, EST AFRICAN AMERICAN: 87 mL/min/{1.73_m2} (ref >59)
GLOBULIN, TOTAL: 2.7 g/dL (ref 1.5–4.5)
GLUCOSE: 94 mg/dL (ref 65–99)
POTASSIUM: 5.2 mmol/L (ref 3.5–5.2)
SODIUM: 142 mmol/L (ref 134–144)
TOTAL PROTEIN: 7.5 g/dL (ref 6.0–8.5)

## 2017-05-23 LAB — LIPID PANEL
Chol/HDL Ratio: 3.7 ratio (ref 0.0–5.0)
Cholesterol, Total: 168 mg/dL (ref 100–199)
HDL: 46 mg/dL (ref >39)
LDL CALC: 89 mg/dL (ref 0–99)
Triglycerides: 166 mg/dL — ABNORMAL HIGH (ref 0–149)
VLDL CHOLESTEROL CAL: 33 mg/dL (ref 5–40)

## 2017-05-23 LAB — ANEMIA PROFILE B
BASOS: 0 %
Basophils Absolute: 0 10*3/uL (ref 0.0–0.2)
EOS (ABSOLUTE): 0.3 10*3/uL (ref 0.0–0.4)
Eos: 4 %
FERRITIN: 191 ng/mL (ref 30–400)
Folate: 5 ng/mL (ref >3.0)
Hematocrit: 48.6 % (ref 37.5–51.0)
Hemoglobin: 16.2 g/dL (ref 13.0–17.7)
IRON: 103 ug/dL (ref 38–169)
Immature Grans (Abs): 0 10*3/uL (ref 0.0–0.1)
Immature Granulocytes: 0 %
Iron Saturation: 23 % (ref 15–55)
LYMPHS ABS: 2.4 10*3/uL (ref 0.7–3.1)
Lymphs: 33 %
MCH: 29.5 pg (ref 26.6–33.0)
MCHC: 33.3 g/dL (ref 31.5–35.7)
MCV: 88 fL (ref 79–97)
MONOCYTES: 6 %
Monocytes Absolute: 0.4 10*3/uL (ref 0.1–0.9)
NEUTROS ABS: 4.1 10*3/uL (ref 1.4–7.0)
NEUTROS PCT: 57 %
PLATELETS: 325 10*3/uL (ref 150–379)
RBC: 5.5 x10E6/uL (ref 4.14–5.80)
RDW: 14.2 % (ref 12.3–15.4)
RETIC CT PCT: 1 % (ref 0.6–2.6)
TIBC: 447 ug/dL (ref 250–450)
UIBC: 344 ug/dL — ABNORMAL HIGH (ref 111–343)
VITAMIN B 12: 564 pg/mL (ref 232–1245)
WBC: 7.3 10*3/uL (ref 3.4–10.8)

## 2017-05-23 LAB — THYROID PANEL WITH TSH
Free Thyroxine Index: 2.3 (ref 1.2–4.9)
T3 Uptake Ratio: 27 % (ref 24–39)
T4, Total: 8.6 ug/dL (ref 4.5–12.0)
TSH: 1.44 u[IU]/mL (ref 0.450–4.500)

## 2017-05-23 LAB — LYME AB/WESTERN BLOT REFLEX
LYME DISEASE AB, QUANT, IGM: <0.8 index (ref 0.00–0.79)
Lyme IgG/IgM Ab: <0.91 {ISR} (ref 0.00–0.90)

## 2017-05-23 LAB — TESTOSTERONE,FREE AND TOTAL
TESTOSTERONE FREE: 8 pg/mL (ref 7.2–24.0)
TESTOSTERONE: 575 ng/dL (ref 264–916)

## 2017-05-26 ENCOUNTER — Emergency Department (HOSPITAL_COMMUNITY)
Admission: EM | Admit: 2017-05-26 | Discharge: 2017-05-27 | Disposition: A | Discharge status: Home / Self Care | Payer: BLUE CROSS/BLUE SHIELD | Attending: Emergency Medicine | Admitting: Emergency Medicine

## 2017-05-26 ENCOUNTER — Telehealth: Payer: Self-pay

## 2017-05-26 ENCOUNTER — Telehealth: Payer: Self-pay | Admitting: Nurse Practitioner

## 2017-05-26 ENCOUNTER — Other Ambulatory Visit: Payer: Self-pay | Admitting: Nurse Practitioner

## 2017-05-26 ENCOUNTER — Encounter (HOSPITAL_COMMUNITY): Payer: Self-pay | Admitting: Nurse Practitioner

## 2017-05-26 DIAGNOSIS — R05 Cough: Secondary | ICD-10-CM | POA: Insufficient documentation

## 2017-05-26 DIAGNOSIS — J3489 Other specified disorders of nose and nasal sinuses: Secondary | ICD-10-CM | POA: Diagnosis not present

## 2017-05-26 DIAGNOSIS — I2510 Atherosclerotic heart disease of native coronary artery without angina pectoris: Secondary | ICD-10-CM | POA: Diagnosis not present

## 2017-05-26 DIAGNOSIS — F17210 Nicotine dependence, cigarettes, uncomplicated: Secondary | ICD-10-CM | POA: Insufficient documentation

## 2017-05-26 DIAGNOSIS — R531 Weakness: Secondary | ICD-10-CM | POA: Diagnosis not present

## 2017-05-26 DIAGNOSIS — R5383 Other fatigue: Secondary | ICD-10-CM

## 2017-05-26 DIAGNOSIS — I10 Essential (primary) hypertension: Secondary | ICD-10-CM | POA: Insufficient documentation

## 2017-05-26 DIAGNOSIS — M6281 Muscle weakness (generalized): Secondary | ICD-10-CM | POA: Diagnosis not present

## 2017-05-26 DIAGNOSIS — Z7982 Long term (current) use of aspirin: Secondary | ICD-10-CM | POA: Diagnosis not present

## 2017-05-26 DIAGNOSIS — Z79899 Other long term (current) drug therapy: Secondary | ICD-10-CM | POA: Insufficient documentation

## 2017-05-26 DIAGNOSIS — R0683 Snoring: Secondary | ICD-10-CM

## 2017-05-26 DIAGNOSIS — Z951 Presence of aortocoronary bypass graft: Secondary | ICD-10-CM | POA: Diagnosis not present

## 2017-05-26 DIAGNOSIS — R410 Disorientation, unspecified: Secondary | ICD-10-CM | POA: Insufficient documentation

## 2017-05-26 LAB — CBC
HCT: 43.8 % (ref 39.0–52.0)
Hemoglobin: 14.7 g/dL (ref 13.0–17.0)
MCH: 29.4 pg (ref 26.0–34.0)
MCHC: 33.6 g/dL (ref 30.0–36.0)
MCV: 87.6 fL (ref 78.0–100.0)
PLATELETS: 298 10*3/uL (ref 150–400)
RBC: 5 MIL/uL (ref 4.22–5.81)
RDW: 13.6 % (ref 11.5–15.5)
WBC: 10 10*3/uL (ref 4.0–10.5)

## 2017-05-26 LAB — BASIC METABOLIC PANEL
Anion gap: 10 (ref 5–15)
BUN: 14 mg/dL (ref 6–20)
CALCIUM: 9.5 mg/dL (ref 8.9–10.3)
CHLORIDE: 105 mmol/L (ref 101–111)
CO2: 23 mmol/L (ref 22–32)
CREATININE: 1.16 mg/dL (ref 0.61–1.24)
GFR calc non Af Amer: >60 mL/min (ref >60)
Glucose, Bld: 85 mg/dL (ref 65–99)
Potassium: 3.9 mmol/L (ref 3.5–5.1)
Sodium: 138 mmol/L (ref 135–145)

## 2017-05-26 LAB — I-STAT TROPONIN, ED: TROPONIN I, POC: 0 ng/mL (ref 0.00–0.08)

## 2017-05-26 NOTE — Telephone Encounter (Signed)
Na with patients home number

## 2017-05-26 NOTE — ED Notes (Signed)
Pt at desk stating he is going to sleep in the car and wife will wait for his name to be called. PT is concerned about the wait time. I reassured pt that his labs and EKG are being reviewed by staff.

## 2017-05-26 NOTE — ED Triage Notes (Addendum)
Pt presents with c/o weakness. The weakness began about a week ago. He feels weak all over his body. He's also had some episodes of confusion and chest pain today. He was unable to remember the correct steps for his job at work this morning and a co-worker had to tell him what to do. He reports cough. He denies fevers, weight gain or loss, dizziness, unilateral weakness, slurred speech, shortness of breath, nausea, vomiting, dysuria, constipation, diarrhea. He saw his primary care doctor on this past Friday for the weakness and was told his blood work was normal and to follow up as needed.

## 2017-05-26 NOTE — Telephone Encounter (Signed)
Patient talked to [facility]

## 2017-05-27 ENCOUNTER — Emergency Department (HOSPITAL_COMMUNITY): Payer: BLUE CROSS/BLUE SHIELD

## 2017-05-27 DIAGNOSIS — R410 Disorientation, unspecified: Secondary | ICD-10-CM | POA: Diagnosis not present

## 2017-05-27 LAB — URINALYSIS, ROUTINE W REFLEX MICROSCOPIC
BILIRUBIN URINE: NEGATIVE
Glucose, UA: NEGATIVE mg/dL
Hgb urine dipstick: NEGATIVE
KETONES UR: NEGATIVE mg/dL
Leukocytes, UA: NEGATIVE
NITRITE: NEGATIVE
PH: 5 (ref 5.0–8.0)
Protein, ur: NEGATIVE mg/dL
Specific Gravity, Urine: 1.016 (ref 1.005–1.030)

## 2017-05-27 LAB — RAPID URINE DRUG SCREEN, HOSP PERFORMED
Amphetamines: NOT DETECTED
BARBITURATES: NOT DETECTED
Benzodiazepines: POSITIVE — AB
Cocaine: NOT DETECTED
OPIATES: NOT DETECTED
TETRAHYDROCANNABINOL: NOT DETECTED

## 2017-05-27 LAB — ETHANOL: Alcohol, Ethyl (B): <5 mg/dL (ref <5)

## 2017-05-27 NOTE — ED Notes (Signed)
Pt stable, ambulatory, states understanding of discharge instructions 

## 2017-05-27 NOTE — ED Notes (Signed)
Patient transported to CT 

## 2017-05-27 NOTE — Discharge Instructions (Signed)
You were seen today for generalized weakness and fatigue.  Your work-up is reassuring.  Follow-up with your primary doctor.  You may need to see a neurologist if symptoms continue.

## 2017-05-27 NOTE — ED Provider Notes (Signed)
[facility] DEPT Provider Note   CSN: [id number] Arrival date & time: 05/26/17  1726     History   Chief Complaint Chief Complaint  Patient presents with  . Weakness    HPI Eric Saunders is a 61 y.o. male.  HPI  This is a 61 year old male with history of coronary artery disease, hypertension, hyperlipidemia who presents with generalized fatigue and confusion. Patient reports one week history of generalized fatigue and not feeling well. He saw his primary physician on Friday who ordered lab work which was "normal." He states that he went to work this morning and was confused. He states he couldn't even get the machines working the way he was supposed to. He denies any weakness, tingling, focal symptoms. He does report recent upper respiratory symptoms with cough, rhinorrhea. No fevers. No rashes or known tick bites. He denies any headache, chest pain, shortness of breath. Denies any drug use.  Of note, labs reviewed from primary office. Labs included thyroid studies, Lyme disease screening, testosterone, lipid panel, anemia profile. These were largely reassuring.  Past Medical History:  Diagnosis Date  . Coronary artery disease   . Hyperlipemia   . Hypertension   . Tobacco abuse     Patient Active Problem List   Diagnosis Date Noted  . Tobacco abuse 08/16/2014  . Acute gouty arthritis 06/27/2014  . Constipation 06/26/2014  . Right arm cellulitis 06/26/2014  . CAD (coronary artery disease) 06/17/2014  . Unstable angina ([place]) 06/16/2014  . Abnormal EKG 01/25/2014  . Hypertension, poor control 12/28/2012  . Hyperlipidemia with target LDL less than 100 12/28/2012  . GAD (generalized anxiety disorder) 12/28/2012    Past Surgical History:  Procedure Laterality Date  . CORONARY ARTERY BYPASS GRAFT N/A 06/17/2014   Procedure: CORONARY ARTERY BYPASS GRAFTING (CABG) X 4, RIGHT LEG EVH;  Surgeon: Gaye Pollack, MD;  Location: [facility] OR;  Service: Open Heart Surgery;  Laterality:  N/A;  . KNEE SURGERY     bilateral arthroscopic  . LEFT HEART CATHETERIZATION WITH CORONARY ANGIOGRAM N/A 06/16/2014   Procedure: LEFT HEART CATHETERIZATION WITH CORONARY ANGIOGRAM;  Surgeon: Burnell Blanks, MD;  Location[facility] C[facility] LAB;  Service: Cardiovascular;  Laterality: N/A;  . TEE WITHOUT CARDIOVERSION N/A 06/17/2014   Procedure: TRANSESOPHAGEAL ECHOCARDIOGRAM (TEE);  Surgeon: Gaye Pollack, MD;  Location: [facility];  Service: Open Heart Surgery;  Laterality: N/A;       Home Medications    Prior to Admission medications   Medication Sig Start Date End Date Taking? Authorizing Provider  ALPRAZolam Duanne Moron) 0.5 MG tablet Take 1 tablet (0.5 mg total) by mouth 2 (two) times daily as needed. Patient taking differently: Take 0.5 mg by mouth 2 (two) times daily as needed for anxiety.  05/22/17  Yes Martin, Mary-Margaret, FNP  amLODipine (NORVASC) 10 MG tablet Take 1 tablet (10 mg total) by mouth daily. 05/22/17  Yes Hassell Done, Mary-Margaret, FNP  aspirin 81 MG tablet Take 81 mg by mouth daily.   Yes [provider]  fenofibrate 160 MG tablet Take 1 tablet (160 mg total) by mouth daily. 05/22/17  Yes Martin, Mary-Margaret, FNP  IBU 800 MG tablet TAKE ONE TABLET BY MOUTH 3 TIMES DAILY AS NEEDED Patient taking differently: TAKE ONE TABLET (800mg ) BY MOUTH 3 TIMES DAILY AS NEEDED 04/17/17  Yes Hassell Done, Mary-Margaret, FNP  losartan (COZAAR) 100 MG tablet Take 1 tablet (100 mg total) by mouth daily. 05/22/17  Yes Martin, Mary-Margaret, FNP  metoprolol succinate (TOPROL XL) 25 MG  24 hr tablet Take 1 tablet (25 mg total) by mouth daily. 05/22/17  Yes Martin, Mary-Margaret, FNP  pravastatin (PRAVACHOL) 40 MG tablet Take 1 tablet (40 mg total) by mouth every evening. 05/22/17  Yes Hassell Done, Mary-Margaret, FNP  sildenafil (VIAGRA) 100 MG tablet Take 0.5-1 tablets (50-100 mg total) by mouth daily as needed for erectile dysfunction. Patient not taking: Reported on 05/26/2017 07/04/15   [name], FNP      Family History Family History  Problem Relation Age of Onset  . Drug abuse Sister   . Healthy Brother   . Diabetes Sister   . Healthy Sister   . Healthy Sister   . Healthy Sister   . Healthy Sister   . Healthy Brother     Social History Social History  Substance Use Topics  . Smoking status: Current Every Day Smoker    Packs/day: 1.00    Years: 30.00    Types: Cigarettes    Start date: 12/28/2015  . Smokeless tobacco: Never Used  . Alcohol use 0.0 oz/week     Comment: occasionally     Allergies   Ace inhibitors   Review of Systems Review of Systems  Constitutional: Positive for fatigue. Negative for diaphoresis and fever.  Respiratory: Negative for shortness of breath.   Cardiovascular: Negative for chest pain.  Gastrointestinal: Negative for abdominal pain, nausea and vomiting.  Genitourinary: Negative for dysuria.  Neurological: Negative for dizziness, speech difficulty, weakness and headaches.  All other systems reviewed and are negative.    Physical Exam Updated Vital Signs BP (!) 144/84   Pulse (!) 56   Temp 98.6 F (37 C) (Oral)   Resp 18   SpO2 93%   Physical Exam  Constitutional: He is oriented to person, place, and time. He appears well-developed and well-nourished. No distress.  HENT:  Head: Normocephalic and atraumatic.  Eyes: Pupils are equal, round, and reactive to light.  Cardiovascular: Normal rate, regular rhythm and normal heart sounds.   No murmur heard. Pulmonary/Chest: Effort normal and breath sounds normal. No respiratory distress. He has no wheezes.  Abdominal: Soft. There is no tenderness. There is no rebound.  Musculoskeletal: He exhibits no edema.  Neurological: He is alert and oriented to person, place, and time.  Cranial nerves II through XII intact, 5 out of 5 strength in all 4 extremities, no dysmetria to finger-nose-finger, no drift  Skin: Skin is warm and dry.  Psychiatric: He has a normal mood and affect.  Nursing  note and vitals reviewed.    ED Treatments / Results  Labs (all labs ordered are listed, but only abnormal results are displayed) Labs Reviewed  RAPID URINE DRUG SCREEN, HOSP PERFORMED - Abnormal; Notable for the following:       Result Value   Benzodiazepines POSITIVE (*)    All other components within normal limits  BASIC METABOLIC PANEL  CBC  URINALYSIS, ROUTINE W REFLEX MICROSCOPIC  ETHANOL  I-STAT TROPONIN, ED    EKG  EKG Interpretation  Date/Time:  Monday May 26 2017 23:44:41 EDT Ventricular Rate:  57 PR Interval:    QRS Duration: 99 QT Interval:  436 QTC Calculation: 425 R Axis:   53 Text Interpretation:  Sinus rhythm RSR' in V1 or V2, right VCD or RVH Confirmed by Thayer Jew [phone]) on 05/27/2017 12:33:40 AM       Radiology Ct Head Wo Contrast  Result Date: 05/27/2017 CLINICAL DATA:  Confusion this morning. Subacute neurological deficits. EXAM: CT HEAD WITHOUT CONTRAST TECHNIQUE:  Contiguous axial images were obtained from the base of the skull through the vertex without intravenous contrast. COMPARISON:  None. FINDINGS: Brain: No evidence of acute infarction, hemorrhage, hydrocephalus, extra-axial collection or mass lesion/mass effect. Vascular: Vascular calcifications in the internal carotid arteries. Skull: Calvarium appears intact. Sinuses/Orbits: Mucosal thickening in the paranasal sinuses with multiple small retention cysts in the maxillary antra and sphenoid sinuses bilaterally. No acute air-fluid levels. Periapical erosions around the maxillary molars bilaterally suggesting periodontal disease. Mastoid air cells are not opacified. Postoperative changes in the orbits. Other: None. IMPRESSION: No acute intracranial abnormalities. Electronically Signed   By: Lucienne Capers M.D.   On: 05/27/2017 00:38    Procedures Procedures (including critical care time)  Medications Ordered in ED Medications - No data to display   Initial Impression /  Assessment and Plan / ED Course  I have reviewed the triage vital signs and the nursing notes.  Pertinent labs & imaging results that were available during my care of the patient were reviewed by me and considered in my medical decision making (see chart for details).     Patient presents with generalized fatigue and weakness as well as confusion. Prior workup by primary reviewed and largely reassuring. He is nonfocal on exam. Exam is fairly benign. Basic labwork obtained and reassuring. CT head negative. No indication at this time for MRI. However, if symptoms continue or worsen patient may need to follow-up with neurology for further evaluation. It also appears his primary has set him up for sleep study. Feel this is very reasonable. Patient and his wife were updated. No acute emergent process at this time.  After history, exam, and medical workup I feel the patient has been appropriately medically screened and is safe for discharge home. Pertinent diagnoses were discussed with the patient. Patient was given return precautions.   Final Clinical Impressions(s) / ED Diagnoses   Final diagnoses:  Generalized weakness  Confusion    New Prescriptions New Prescriptions   No medications on file     Merryl Hacker, MD 05/27/17 [phone]

## 2017-05-27 NOTE — ED Notes (Signed)
Pt aware urine sample needed 

## 2017-10-04 ENCOUNTER — Encounter: Payer: Self-pay | Admitting: Family Medicine

## 2017-10-04 ENCOUNTER — Ambulatory Visit (INDEPENDENT_AMBULATORY_CARE_PROVIDER_SITE_OTHER): Payer: BLUE CROSS/BLUE SHIELD | Admitting: Family Medicine

## 2017-10-04 VITALS — BP 157/88 | HR 62 | Temp 97.4°F | Ht 67.0 in | Wt 195.0 lb

## 2017-10-04 DIAGNOSIS — R6889 Other general symptoms and signs: Secondary | ICD-10-CM

## 2017-10-04 DIAGNOSIS — R5383 Other fatigue: Secondary | ICD-10-CM

## 2017-10-04 DIAGNOSIS — R0683 Snoring: Secondary | ICD-10-CM

## 2017-10-04 DIAGNOSIS — J329 Chronic sinusitis, unspecified: Secondary | ICD-10-CM | POA: Diagnosis not present

## 2017-10-04 DIAGNOSIS — J40 Bronchitis, not specified as acute or chronic: Secondary | ICD-10-CM | POA: Diagnosis not present

## 2017-10-04 MED ORDER — LEVOFLOXACIN 500 MG PO TABS: 500.0000 mg | ORAL_TABLET | Freq: Every day | ORAL | 0 refills | Status: DC

## 2017-10-04 NOTE — Progress Notes (Addendum)
Chief Complaint  Patient presents with  . Cough    HA pressure  . Nasal Congestion    x 1 week     HPI  Patient presents today for patient presents with dry cough runny stuffy nose. Diffuse headache of moderate intensity. Patient also has chills and subjective fever. Body aches present in the legs, shoulders, and torso as well. Has sapped his energy . Onset 6 days ago was mild.  Has crescendoed over the last 2 days.  PMH: Smoking status noted ROS: Per HPI  Objective: BP (!) 157/88   Pulse 62   Temp (!) 97.4 F (36.3 C) (Oral)   Ht 5\' 7"  (1.702 m)   Wt 195 lb (88.5 kg)   BMI 30.54 kg/m  Gen: NAD, alert, cooperative with exam HEENT: NCAT, EOMI, PERRL.  Nasal passages are boggy and swollen minimally erythematous CV: RRR, good S1/S2, no murmur Resp: CTABL, no wheezes, non-labored Ext: No edema, warm Neuro: Alert and oriented, No gross deficits  Assessment and plan:  1. Flu-like symptoms   2. Snoring   3. Fatigue, unspecified type   4. Sinobronchitis     Meds ordered this encounter  Medications  . levofloxacin (LEVAQUIN) 500 MG tablet    Sig: Take 1 tablet (500 mg total) by mouth daily. For 10 days    Dispense:  10 tablet    Refill:  0    Orders Placed This Encounter  Procedures  . Veritor Flu A/B Waived    Order Specific Question:   Source    Answer:   nasal    Follow up as needed.  Claretta Fraise, MD

## 2017-10-06 LAB — VERITOR FLU A/B WAIVED
INFLUENZA A: NEGATIVE
INFLUENZA B: NEGATIVE

## 2017-10-14 ENCOUNTER — Ambulatory Visit: Payer: BLUE CROSS/BLUE SHIELD | Admitting: Nurse Practitioner

## 2017-10-14 ENCOUNTER — Ambulatory Visit (INDEPENDENT_AMBULATORY_CARE_PROVIDER_SITE_OTHER): Payer: BLUE CROSS/BLUE SHIELD

## 2017-10-14 ENCOUNTER — Encounter: Payer: Self-pay | Admitting: Nurse Practitioner

## 2017-10-14 VITALS — BP 129/77 | HR 73 | Temp 97.0°F | Ht 67.0 in | Wt 197.0 lb

## 2017-10-14 DIAGNOSIS — F410 Panic disorder [episodic paroxysmal anxiety] without agoraphobia: Secondary | ICD-10-CM | POA: Diagnosis not present

## 2017-10-14 DIAGNOSIS — R0602 Shortness of breath: Secondary | ICD-10-CM | POA: Diagnosis not present

## 2017-10-14 DIAGNOSIS — L03113 Cellulitis of right upper limb: Secondary | ICD-10-CM

## 2017-10-14 DIAGNOSIS — R5383 Other fatigue: Secondary | ICD-10-CM | POA: Diagnosis not present

## 2017-10-14 MED ORDER — ESCITALOPRAM OXALATE 10 MG PO TABS: 10.0000 mg | ORAL_TABLET | Freq: Every day | ORAL | 5 refills | Status: DC

## 2017-10-14 MED ORDER — IBUPROFEN 800 MG PO TABS: ORAL_TABLET | ORAL | 0 refills | Status: DC

## 2017-10-14 NOTE — Addendum Note (Signed)
Addended by: [name] on: 10/14/2017 02:28 PM   Modules accepted: Orders

## 2017-10-14 NOTE — Progress Notes (Signed)
   Subjective:    Patient ID: Eric Saunders, male    DOB: June 30, 1956, 62 y.o.   MRN: [id number]  HPI Patient had an episode at work today where he got really hot, heart racing and blood pressure elevated. He saw nurse at work and she told him he needed to see PCP. He has had a cough for several days but does smoke.  C/O fatigue- he snore bad at night and gasps sometimes  Review of Systems  Constitutional: Negative.   HENT: Negative.   Respiratory: Positive for cough and shortness of breath.   Cardiovascular: Negative for chest pain, palpitations and leg swelling.  Neurological: Negative.   Psychiatric/Behavioral: Negative.   All other systems reviewed and are negative.      Objective:   Physical Exam  Constitutional: He is oriented to person, place, and time. He appears well-developed and well-nourished. No distress.  Cardiovascular: Normal rate and regular rhythm.  Pulmonary/Chest: Effort normal and breath sounds normal.  Neurological: He is alert and oriented to person, place, and time.  Skin: Skin is warm.  Psychiatric: He has a normal mood and affect. His behavior is normal. Judgment and thought content normal.   BP 129/77   Pulse 73   Temp (!) 97 F (36.1 C) (Oral)   Ht 5\' 7"  (1.702 m)   Wt 197 lb (89.4 kg)   SpO2 96%   BMI 30.85 kg/m   Chest xray Edyth Gunnels, FNP      Assessment & Plan:  1. SOB (shortness of breath) - DG Chest 2 View; Future  2. Anxiety attack Stress management Keep diary of episodes - escitalopram (LEXAPRO) 10 MG tablet; Take 1 tablet (10 mg total) by mouth daily.  Dispense: 30 tablet; Refill: 5  3. Fatigue, unspecified type Needs sleep study but patient refuses  Mary-Margaret Hassell Done, FNP

## 2017-10-14 NOTE — Patient Instructions (Signed)
Panic Attacks Panic attacks are sudden, short feelings of great fear or discomfort. You may have them for no reason when you are relaxed, when you are uneasy (anxious), or when you are sleeping. Follow these instructions at home:  Take all your medicines as told.  Check with your doctor before starting new medicines.  Keep all doctor visits. Contact a doctor if:  You are not able to take your medicines as told.  Your symptoms do not get better.  Your symptoms get worse. Get help right away if:  Your attacks seem different than your normal attacks.  You have thoughts about hurting yourself or others.  You take panic attack medicine and you have a side effect. This information is not intended to replace advice given to you by your health care provider. Make sure you discuss any questions you have with your health care provider. Document Released: 10/05/2010 Document Revised: 02/08/2016 Document Reviewed: 04/16/2013 Elsevier Interactive Patient Education  2017 [employer]  

## 2017-12-17 ENCOUNTER — Other Ambulatory Visit: Payer: Self-pay | Admitting: Nurse Practitioner

## 2017-12-17 DIAGNOSIS — I10 Essential (primary) hypertension: Secondary | ICD-10-CM

## 2018-01-21 DIAGNOSIS — M25562 Pain in left knee: Secondary | ICD-10-CM | POA: Insufficient documentation

## 2018-02-06 ENCOUNTER — Encounter: Payer: Self-pay | Admitting: Nurse Practitioner

## 2018-02-12 ENCOUNTER — Telehealth: Payer: Self-pay | Admitting: Nurse Practitioner

## 2018-02-12 NOTE — Telephone Encounter (Signed)
I called hospital to request records for pt. They said I would have to fax cover sheet with our fax number on it. I faxed cover sheet at 2:52 pm

## 2018-02-13 ENCOUNTER — Ambulatory Visit (INDEPENDENT_AMBULATORY_CARE_PROVIDER_SITE_OTHER): Payer: BLUE CROSS/BLUE SHIELD

## 2018-02-13 ENCOUNTER — Ambulatory Visit: Payer: BLUE CROSS/BLUE SHIELD | Admitting: Nurse Practitioner

## 2018-02-13 ENCOUNTER — Encounter: Payer: Self-pay | Admitting: Nurse Practitioner

## 2018-02-13 DIAGNOSIS — S42474S Nondisplaced transcondylar fracture of right humerus, sequela: Secondary | ICD-10-CM | POA: Diagnosis not present

## 2018-02-13 DIAGNOSIS — S42101A Fracture of unspecified part of scapula, right shoulder, initial encounter for closed fracture: Secondary | ICD-10-CM | POA: Insufficient documentation

## 2018-02-13 DIAGNOSIS — S2243XD Multiple fractures of ribs, bilateral, subsequent encounter for fracture with routine healing: Secondary | ICD-10-CM | POA: Diagnosis not present

## 2018-02-13 DIAGNOSIS — S42253A Displaced fracture of greater tuberosity of unspecified humerus, initial encounter for closed fracture: Secondary | ICD-10-CM | POA: Insufficient documentation

## 2018-02-13 DIAGNOSIS — S43004A Unspecified dislocation of right shoulder joint, initial encounter: Secondary | ICD-10-CM | POA: Diagnosis not present

## 2018-02-13 DIAGNOSIS — M25511 Pain in right shoulder: Secondary | ICD-10-CM

## 2018-02-13 DIAGNOSIS — S2239XA Fracture of one rib, unspecified side, initial encounter for closed fracture: Secondary | ICD-10-CM | POA: Diagnosis not present

## 2018-02-13 DIAGNOSIS — Z09 Encounter for follow-up examination after completed treatment for conditions other than malignant neoplasm: Secondary | ICD-10-CM

## 2018-02-13 DIAGNOSIS — S42191D Fracture of other part of scapula, right shoulder, subsequent encounter for fracture with routine healing: Secondary | ICD-10-CM | POA: Diagnosis not present

## 2018-02-13 DIAGNOSIS — M546 Pain in thoracic spine: Secondary | ICD-10-CM | POA: Diagnosis not present

## 2018-02-13 NOTE — Patient Instructions (Signed)
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
## 2018-02-20 DIAGNOSIS — S42101D Fracture of unspecified part of scapula, right shoulder, subsequent encounter for fracture with routine healing: Secondary | ICD-10-CM | POA: Diagnosis not present

## 2018-02-20 DIAGNOSIS — S42254D Nondisplaced fracture of greater tuberosity of right humerus, subsequent encounter for fracture with routine healing: Secondary | ICD-10-CM | POA: Diagnosis not present

## 2018-02-26 DIAGNOSIS — S42254D Nondisplaced fracture of greater tuberosity of right humerus, subsequent encounter for fracture with routine healing: Secondary | ICD-10-CM | POA: Diagnosis not present

## 2018-02-26 DIAGNOSIS — S42101D Fracture of unspecified part of scapula, right shoulder, subsequent encounter for fracture with routine healing: Secondary | ICD-10-CM | POA: Diagnosis not present

## 2018-03-13 ENCOUNTER — Other Ambulatory Visit: Payer: Self-pay | Admitting: Nurse Practitioner

## 2018-03-13 MED ORDER — CYCLOBENZAPRINE HCL 10 MG PO TABS: 10.0000 mg | ORAL_TABLET | Freq: Three times a day (TID) | ORAL | 1 refills | Status: DC | PRN

## 2018-03-16 DIAGNOSIS — S42254D Nondisplaced fracture of greater tuberosity of right humerus, subsequent encounter for fracture with routine healing: Secondary | ICD-10-CM | POA: Diagnosis not present

## 2018-03-20 DIAGNOSIS — S42254D Nondisplaced fracture of greater tuberosity of right humerus, subsequent encounter for fracture with routine healing: Secondary | ICD-10-CM | POA: Diagnosis not present

## 2018-03-23 DIAGNOSIS — S42254D Nondisplaced fracture of greater tuberosity of right humerus, subsequent encounter for fracture with routine healing: Secondary | ICD-10-CM | POA: Diagnosis not present

## 2018-03-24 DIAGNOSIS — S42254D Nondisplaced fracture of greater tuberosity of right humerus, subsequent encounter for fracture with routine healing: Secondary | ICD-10-CM | POA: Diagnosis not present

## 2018-03-24 DIAGNOSIS — S42101D Fracture of unspecified part of scapula, right shoulder, subsequent encounter for fracture with routine healing: Secondary | ICD-10-CM | POA: Diagnosis not present

## 2018-03-26 DIAGNOSIS — S42254D Nondisplaced fracture of greater tuberosity of right humerus, subsequent encounter for fracture with routine healing: Secondary | ICD-10-CM | POA: Diagnosis not present

## 2018-03-31 DIAGNOSIS — S42254D Nondisplaced fracture of greater tuberosity of right humerus, subsequent encounter for fracture with routine healing: Secondary | ICD-10-CM | POA: Diagnosis not present

## 2018-04-01 ENCOUNTER — Other Ambulatory Visit: Payer: Self-pay | Admitting: Nurse Practitioner

## 2018-04-01 DIAGNOSIS — I10 Essential (primary) hypertension: Secondary | ICD-10-CM

## 2018-04-02 DIAGNOSIS — S42254D Nondisplaced fracture of greater tuberosity of right humerus, subsequent encounter for fracture with routine healing: Secondary | ICD-10-CM | POA: Diagnosis not present

## 2018-04-07 DIAGNOSIS — S42254D Nondisplaced fracture of greater tuberosity of right humerus, subsequent encounter for fracture with routine healing: Secondary | ICD-10-CM | POA: Diagnosis not present
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
## 2018-04-09 DIAGNOSIS — S42254D Nondisplaced fracture of greater tuberosity of right humerus, subsequent encounter for fracture with routine healing: Secondary | ICD-10-CM | POA: Diagnosis not present

## 2018-04-10 ENCOUNTER — Ambulatory Visit (INDEPENDENT_AMBULATORY_CARE_PROVIDER_SITE_OTHER): Payer: BLUE CROSS/BLUE SHIELD | Admitting: Cardiology

## 2018-04-10 ENCOUNTER — Encounter: Payer: Self-pay | Admitting: Cardiology

## 2018-04-10 VITALS — BP 130/78 | HR 56 | Ht 68.0 in | Wt 186.8 lb

## 2018-04-10 DIAGNOSIS — Z720 Tobacco use: Secondary | ICD-10-CM

## 2018-04-10 DIAGNOSIS — E785 Hyperlipidemia, unspecified: Secondary | ICD-10-CM | POA: Diagnosis not present

## 2018-04-10 DIAGNOSIS — I25810 Atherosclerosis of coronary artery bypass graft(s) without angina pectoris: Secondary | ICD-10-CM | POA: Diagnosis not present

## 2018-04-10 NOTE — Patient Instructions (Signed)
Medication Instructions:  Continue current medications  If you need a refill on your cardiac medications before your next appointment, please call your pharmacy.  Labwork: None Ordered   Testing/Procedures: None Ordered  Follow-Up: Your physician wants you to follow-up in: 1 Year. You should receive a reminder letter in the mail two months in advance. If you do not receive a letter, please call our office [phone].    Thank you for choosing [facility]!!      

## 2018-04-14 DIAGNOSIS — S42254D Nondisplaced fracture of greater tuberosity of right humerus, subsequent encounter for fracture with routine healing: Secondary | ICD-10-CM | POA: Diagnosis not present

## 2018-04-17 DIAGNOSIS — S42254D Nondisplaced fracture of greater tuberosity of right humerus, subsequent encounter for fracture with routine healing: Secondary | ICD-10-CM | POA: Diagnosis not present

## 2018-04-20 DIAGNOSIS — S42254D Nondisplaced fracture of greater tuberosity of right humerus, subsequent encounter for fracture with routine healing: Secondary | ICD-10-CM | POA: Diagnosis not present

## 2018-04-21 DIAGNOSIS — S42254D Nondisplaced fracture of greater tuberosity of right humerus, subsequent encounter for fracture with routine healing: Secondary | ICD-10-CM | POA: Diagnosis not present

## 2018-04-23 DIAGNOSIS — S42254D Nondisplaced fracture of greater tuberosity of right humerus, subsequent encounter for fracture with routine healing: Secondary | ICD-10-CM | POA: Diagnosis not present

## 2018-04-28 DIAGNOSIS — S42254D Nondisplaced fracture of greater tuberosity of right humerus, subsequent encounter for fracture with routine healing: Secondary | ICD-10-CM | POA: Diagnosis not present

## 2018-04-30 DIAGNOSIS — S42254D Nondisplaced fracture of greater tuberosity of right humerus, subsequent encounter for fracture with routine healing: Secondary | ICD-10-CM | POA: Diagnosis not present

## 2018-05-04 DIAGNOSIS — S42254D Nondisplaced fracture of greater tuberosity of right humerus, subsequent encounter for fracture with routine healing: Secondary | ICD-10-CM | POA: Diagnosis not present

## 2018-05-07 DIAGNOSIS — S42254D Nondisplaced fracture of greater tuberosity of right humerus, subsequent encounter for fracture with routine healing: Secondary | ICD-10-CM | POA: Diagnosis not present

## 2018-05-11 DIAGNOSIS — S42254D Nondisplaced fracture of greater tuberosity of right humerus, subsequent encounter for fracture with routine healing: Secondary | ICD-10-CM | POA: Diagnosis not present

## 2018-05-12 DIAGNOSIS — S42254D Nondisplaced fracture of greater tuberosity of right humerus, subsequent encounter for fracture with routine healing: Secondary | ICD-10-CM | POA: Diagnosis not present

## 2018-05-19 DIAGNOSIS — S42254D Nondisplaced fracture of greater tuberosity of right humerus, subsequent encounter for fracture with routine healing: Secondary | ICD-10-CM | POA: Diagnosis not present

## 2018-05-23 ENCOUNTER — Other Ambulatory Visit: Payer: Self-pay | Admitting: Nurse Practitioner

## 2018-05-23 DIAGNOSIS — I10 Essential (primary) hypertension: Secondary | ICD-10-CM

## 2018-05-23 DIAGNOSIS — E785 Hyperlipidemia, unspecified: Secondary | ICD-10-CM

## 2018-05-23 DIAGNOSIS — I25810 Atherosclerosis of coronary artery bypass graft(s) without angina pectoris: Secondary | ICD-10-CM

## 2018-05-26 DIAGNOSIS — S42254D Nondisplaced fracture of greater tuberosity of right humerus, subsequent encounter for fracture with routine healing: Secondary | ICD-10-CM | POA: Diagnosis not present

## 2018-05-28 DIAGNOSIS — S42254D Nondisplaced fracture of greater tuberosity of right humerus, subsequent encounter for fracture with routine healing: Secondary | ICD-10-CM | POA: Diagnosis not present

## 2018-06-01 DIAGNOSIS — S42254D Nondisplaced fracture of greater tuberosity of right humerus, subsequent encounter for fracture with routine healing: Secondary | ICD-10-CM | POA: Diagnosis not present

## 2018-06-02 DIAGNOSIS — S42254D Nondisplaced fracture of greater tuberosity of right humerus, subsequent encounter for fracture with routine healing: Secondary | ICD-10-CM | POA: Diagnosis not present

## 2018-06-04 DIAGNOSIS — S42254D Nondisplaced fracture of greater tuberosity of right humerus, subsequent encounter for fracture with routine healing: Secondary | ICD-10-CM | POA: Diagnosis not present

## 2018-06-05 ENCOUNTER — Other Ambulatory Visit: Payer: Self-pay | Admitting: Pediatrics

## 2018-06-05 DIAGNOSIS — I10 Essential (primary) hypertension: Secondary | ICD-10-CM

## 2018-06-11 DIAGNOSIS — S42254D Nondisplaced fracture of greater tuberosity of right humerus, subsequent encounter for fracture with routine healing: Secondary | ICD-10-CM | POA: Diagnosis not present

## 2018-06-16 DIAGNOSIS — S42254D Nondisplaced fracture of greater tuberosity of right humerus, subsequent encounter for fracture with routine healing: Secondary | ICD-10-CM | POA: Diagnosis not present

## 2018-06-18 DIAGNOSIS — M25511 Pain in right shoulder: Secondary | ICD-10-CM | POA: Insufficient documentation

## 2018-06-18 DIAGNOSIS — S42254D Nondisplaced fracture of greater tuberosity of right humerus, subsequent encounter for fracture with routine healing: Secondary | ICD-10-CM | POA: Diagnosis not present

## 2018-06-23 DIAGNOSIS — S42254D Nondisplaced fracture of greater tuberosity of right humerus, subsequent encounter for fracture with routine healing: Secondary | ICD-10-CM | POA: Diagnosis not present

## 2018-06-25 DIAGNOSIS — S42254D Nondisplaced fracture of greater tuberosity of right humerus, subsequent encounter for fracture with routine healing: Secondary | ICD-10-CM | POA: Diagnosis not present

## 2018-06-29 DIAGNOSIS — S42254D Nondisplaced fracture of greater tuberosity of right humerus, subsequent encounter for fracture with routine healing: Secondary | ICD-10-CM | POA: Diagnosis not present

## 2018-06-30 DIAGNOSIS — S42254D Nondisplaced fracture of greater tuberosity of right humerus, subsequent encounter for fracture with routine healing: Secondary | ICD-10-CM | POA: Diagnosis not present

## 2018-06-30 DIAGNOSIS — M7501 Adhesive capsulitis of right shoulder: Secondary | ICD-10-CM | POA: Diagnosis not present

## 2018-07-02 DIAGNOSIS — S42254D Nondisplaced fracture of greater tuberosity of right humerus, subsequent encounter for fracture with routine healing: Secondary | ICD-10-CM | POA: Diagnosis not present

## 2018-07-07 DIAGNOSIS — S42254D Nondisplaced fracture of greater tuberosity of right humerus, subsequent encounter for fracture with routine healing: Secondary | ICD-10-CM | POA: Diagnosis not present

## 2018-07-09 DIAGNOSIS — S42254D Nondisplaced fracture of greater tuberosity of right humerus, subsequent encounter for fracture with routine healing: Secondary | ICD-10-CM | POA: Diagnosis not present

## 2018-07-14 ENCOUNTER — Other Ambulatory Visit: Payer: Self-pay | Admitting: Nurse Practitioner

## 2018-07-14 DIAGNOSIS — I10 Essential (primary) hypertension: Secondary | ICD-10-CM

## 2018-07-14 DIAGNOSIS — E785 Hyperlipidemia, unspecified: Secondary | ICD-10-CM

## 2018-07-14 DIAGNOSIS — S42254D Nondisplaced fracture of greater tuberosity of right humerus, subsequent encounter for fracture with routine healing: Secondary | ICD-10-CM | POA: Diagnosis not present

## 2018-07-14 DIAGNOSIS — I25810 Atherosclerosis of coronary artery bypass graft(s) without angina pectoris: Secondary | ICD-10-CM

## 2018-07-14 NOTE — Telephone Encounter (Signed)
Last lipid 06/01/17

## 2018-07-16 DIAGNOSIS — S42254D Nondisplaced fracture of greater tuberosity of right humerus, subsequent encounter for fracture with routine healing: Secondary | ICD-10-CM | POA: Diagnosis not present

## 2018-07-16 NOTE — Telephone Encounter (Signed)
Appointment scheduled.

## 2018-07-16 NOTE — Telephone Encounter (Signed)
Last refill without being seen 

## 2018-07-21 DIAGNOSIS — S42254D Nondisplaced fracture of greater tuberosity of right humerus, subsequent encounter for fracture with routine healing: Secondary | ICD-10-CM | POA: Diagnosis not present

## 2018-07-21 DIAGNOSIS — M7501 Adhesive capsulitis of right shoulder: Secondary | ICD-10-CM | POA: Diagnosis not present

## 2018-07-21 DIAGNOSIS — M7022 Olecranon bursitis, left elbow: Secondary | ICD-10-CM | POA: Diagnosis present

## 2018-07-23 DIAGNOSIS — S42254D Nondisplaced fracture of greater tuberosity of right humerus, subsequent encounter for fracture with routine healing: Secondary | ICD-10-CM | POA: Diagnosis not present

## 2018-07-28 ENCOUNTER — Encounter: Payer: Self-pay | Admitting: Nurse Practitioner

## 2018-07-28 ENCOUNTER — Ambulatory Visit (INDEPENDENT_AMBULATORY_CARE_PROVIDER_SITE_OTHER): Payer: BLUE CROSS/BLUE SHIELD | Admitting: Nurse Practitioner

## 2018-07-28 VITALS — BP 125/74 | HR 53 | Temp 96.9°F | Ht 68.0 in | Wt 194.0 lb

## 2018-07-28 DIAGNOSIS — Z720 Tobacco use: Secondary | ICD-10-CM

## 2018-07-28 DIAGNOSIS — Z125 Encounter for screening for malignant neoplasm of prostate: Secondary | ICD-10-CM

## 2018-07-28 DIAGNOSIS — F411 Generalized anxiety disorder: Secondary | ICD-10-CM | POA: Diagnosis not present

## 2018-07-28 DIAGNOSIS — S42254D Nondisplaced fracture of greater tuberosity of right humerus, subsequent encounter for fracture with routine healing: Secondary | ICD-10-CM | POA: Diagnosis not present

## 2018-07-28 DIAGNOSIS — I25810 Atherosclerosis of coronary artery bypass graft(s) without angina pectoris: Secondary | ICD-10-CM | POA: Diagnosis not present

## 2018-07-28 DIAGNOSIS — I10 Essential (primary) hypertension: Secondary | ICD-10-CM

## 2018-07-28 DIAGNOSIS — F410 Panic disorder [episodic paroxysmal anxiety] without agoraphobia: Secondary | ICD-10-CM

## 2018-07-28 DIAGNOSIS — E785 Hyperlipidemia, unspecified: Secondary | ICD-10-CM

## 2018-07-28 MED ORDER — PRAVASTATIN SODIUM 40 MG PO TABS: ORAL_TABLET | ORAL | 1 refills | Status: DC

## 2018-07-28 MED ORDER — ALPRAZOLAM 0.5 MG PO TABS: 0.5000 mg | ORAL_TABLET | Freq: Two times a day (BID) | ORAL | 5 refills | Status: DC | PRN

## 2018-07-28 MED ORDER — ESCITALOPRAM OXALATE 10 MG PO TABS: 10.0000 mg | ORAL_TABLET | Freq: Every day | ORAL | 1 refills | Status: DC

## 2018-07-28 MED ORDER — METOPROLOL SUCCINATE ER 25 MG PO TB24: 25.0000 mg | ORAL_TABLET | Freq: Every day | ORAL | 1 refills | Status: DC

## 2018-07-28 MED ORDER — AMLODIPINE BESYLATE 10 MG PO TABS: 10.0000 mg | ORAL_TABLET | Freq: Every day | ORAL | 1 refills | Status: DC

## 2018-07-28 MED ORDER — FENOFIBRATE 160 MG PO TABS: 160.0000 mg | ORAL_TABLET | Freq: Every day | ORAL | 1 refills | Status: DC

## 2018-07-28 MED ORDER — LOSARTAN POTASSIUM 100 MG PO TABS: ORAL_TABLET | ORAL | 1 refills | Status: DC
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
## 2018-07-28 NOTE — Patient Instructions (Signed)
DASH Eating Plan DASH stands for "Dietary Approaches to Stop Hypertension." The DASH eating plan is a healthy eating plan that has been shown to reduce high blood pressure (hypertension). It may also reduce your risk for type 2 diabetes, heart disease, and stroke. The DASH eating plan may also help with weight loss. What are tips for following this plan? General guidelines  Avoid eating more than 2,300 mg (milligrams) of salt (sodium) a day. If you have hypertension, you may need to reduce your sodium intake to 1,500 mg a day.  Limit alcohol intake to no more than 1 drink a day for nonpregnant women and 2 drinks a day for men. One drink equals 12 oz of beer, 5 oz of wine, or 1 oz of hard liquor.  Work with your health care provider to maintain a healthy body weight or to lose weight. Ask what an ideal weight is for you.  Get at least 30 minutes of exercise that causes your heart to beat faster (aerobic exercise) most days of the week. Activities may include walking, swimming, or biking.  Work with your health care provider or diet and nutrition specialist (dietitian) to adjust your eating plan to your individual calorie needs. Reading food labels  Check food labels for the amount of sodium per serving. Choose foods with less than 5 percent of the Daily Value of sodium. Generally, foods with less than 300 mg of sodium per serving fit into this eating plan.  To find whole grains, look for the word "whole" as the first word in the ingredient list. Shopping  Buy products labeled as "low-sodium" or "no salt added."  Buy fresh foods. Avoid canned foods and premade or frozen meals. Cooking  Avoid adding salt when cooking. Use salt-free seasonings or herbs instead of table salt or sea salt. Check with your health care provider or pharmacist before using salt substitutes.  Do not fry foods. Cook foods using healthy methods such as baking, boiling, grilling, and broiling instead.  Cook with  heart-healthy oils, such as olive, canola, soybean, or sunflower oil. Meal planning   Eat a balanced diet that includes: ? 5 or more servings of fruits and vegetables each day. At each meal, try to fill half of your plate with fruits and vegetables. ? Up to 6-8 servings of whole grains each day. ? Less than 6 oz of lean meat, poultry, or fish each day. A 3-oz serving of meat is about the same size as a deck of cards. One egg equals 1 oz. ? 2 servings of low-fat dairy each day. ? A serving of nuts, seeds, or beans 5 times each week. ? Heart-healthy fats. Healthy fats called Omega-3 fatty acids are found in foods such as flaxseeds and coldwater fish, like sardines, salmon, and mackerel.  Limit how much you eat of the following: ? Canned or prepackaged foods. ? Food that is high in trans fat, such as fried foods. ? Food that is high in saturated fat, such as fatty meat. ? Sweets, desserts, sugary drinks, and other foods with added sugar. ? Full-fat dairy products.  Do not salt foods before eating.  Try to eat at least 2 vegetarian meals each week.  Eat more home-cooked food and less restaurant, buffet, and fast food.  When eating at a restaurant, ask that your food be prepared with less salt or no salt, if possible. What foods are recommended? The items listed may not be a complete list. Talk with your dietitian about what   dietary choices are best for you. Grains Whole-grain or whole-wheat bread. Whole-grain or whole-wheat pasta. Brown rice. Oatmeal. Quinoa. Bulgur. Whole-grain and low-sodium cereals. Pita bread. Low-fat, low-sodium crackers. Whole-wheat flour tortillas. Vegetables Fresh or frozen vegetables (raw, steamed, roasted, or grilled). Low-sodium or reduced-sodium tomato and vegetable juice. Low-sodium or reduced-sodium tomato sauce and tomato paste. Low-sodium or reduced-sodium canned vegetables. Fruits All fresh, dried, or frozen fruit. Canned fruit in natural juice (without  added sugar). Meat and other protein foods Skinless chicken or turkey. Ground chicken or turkey. Pork with fat trimmed off. Fish and seafood. Egg whites. Dried beans, peas, or lentils. Unsalted nuts, nut butters, and seeds. Unsalted canned beans. Lean cuts of beef with fat trimmed off. Low-sodium, lean deli meat. Dairy Low-fat (1%) or fat-free (skim) milk. Fat-free, low-fat, or reduced-fat cheeses. Nonfat, low-sodium ricotta or cottage cheese. Low-fat or nonfat yogurt. Low-fat, low-sodium cheese. Fats and oils Soft margarine without trans fats. Vegetable oil. Low-fat, reduced-fat, or light mayonnaise and salad dressings (reduced-sodium). Canola, safflower, olive, soybean, and sunflower oils. Avocado. Seasoning and other foods Herbs. Spices. Seasoning mixes without salt. Unsalted popcorn and pretzels. Fat-free sweets. What foods are not recommended? The items listed may not be a complete list. Talk with your dietitian about what dietary choices are best for you. Grains Baked goods made with fat, such as croissants, muffins, or some breads. Dry pasta or rice meal packs. Vegetables Creamed or fried vegetables. Vegetables in a cheese sauce. Regular canned vegetables (not low-sodium or reduced-sodium). Regular canned tomato sauce and paste (not low-sodium or reduced-sodium). Regular tomato and vegetable juice (not low-sodium or reduced-sodium). Pickles. Olives. Fruits Canned fruit in a light or heavy syrup. Fried fruit. Fruit in cream or butter sauce. Meat and other protein foods Fatty cuts of meat. Ribs. Fried meat. Bacon. Sausage. Bologna and other processed lunch meats. Salami. Fatback. Hotdogs. Bratwurst. Salted nuts and seeds. Canned beans with added salt. Canned or smoked fish. Whole eggs or egg yolks. Chicken or turkey with skin. Dairy Whole or 2% milk, cream, and half-and-half. Whole or full-fat cream cheese. Whole-fat or sweetened yogurt. Full-fat cheese. Nondairy creamers. Whipped toppings.  Processed cheese and cheese spreads. Fats and oils Butter. Stick margarine. Lard. Shortening. Ghee. Bacon fat. Tropical oils, such as coconut, palm kernel, or palm oil. Seasoning and other foods Salted popcorn and pretzels. Onion salt, garlic salt, seasoned salt, table salt, and sea salt. Worcestershire sauce. Tartar sauce. Barbecue sauce. Teriyaki sauce. Soy sauce, including reduced-sodium. Steak sauce. Canned and packaged gravies. Fish sauce. Oyster sauce. Cocktail sauce. Horseradish that you find on the shelf. Ketchup. Mustard. Meat flavorings and tenderizers. Bouillon cubes. Hot sauce and Tabasco sauce. Premade or packaged marinades. Premade or packaged taco seasonings. Relishes. Regular salad dressings. Where to find more information:  National Heart, Lung, and Blood Institute: www.nhlbi.nih.gov  American Heart Association: www.heart.org Summary  The DASH eating plan is a healthy eating plan that has been shown to reduce high blood pressure (hypertension). It may also reduce your risk for type 2 diabetes, heart disease, and stroke.  With the DASH eating plan, you should limit salt (sodium) intake to 2,300 mg a day. If you have hypertension, you may need to reduce your sodium intake to 1,500 mg a day.  When on the DASH eating plan, aim to eat more fresh fruits and vegetables, whole grains, lean proteins, low-fat dairy, and heart-healthy fats.  Work with your health care provider or diet and nutrition specialist (dietitian) to adjust your eating plan to your individual   calorie needs. This information is not intended to replace advice given to you by your health care provider. Make sure you discuss any questions you have with your health care provider. Document Released: 08/22/2011 Document Revised: 08/26/2016 Document Reviewed: 08/26/2016 Elsevier Interactive Patient Education  2018 [employer]vier Inc.  

## 2018-07-29 LAB — PSA, TOTAL AND FREE
PROSTATE SPECIFIC AG, SERUM: 1.7 ng/mL (ref 0.0–4.0)
PSA, Free Pct: 22.9 %
PSA, Free: 0.39 ng/mL

## 2018-07-29 LAB — LIPID PANEL
CHOL/HDL RATIO: 3.7 ratio (ref 0.0–5.0)
Cholesterol, Total: 198 mg/dL (ref 100–199)
HDL: 53 mg/dL (ref >39)
LDL CALC: 114 mg/dL — AB (ref 0–99)
Triglycerides: 154 mg/dL — ABNORMAL HIGH (ref 0–149)
VLDL CHOLESTEROL CAL: 31 mg/dL (ref 5–40)

## 2018-07-29 LAB — CMP14+EGFR
A/G RATIO: 1.8 (ref 1.2–2.2)
ALT: 10 IU/L (ref 0–44)
AST: 10 IU/L (ref 0–40)
Albumin: 4.5 g/dL (ref 3.6–4.8)
Alkaline Phosphatase: 48 IU/L (ref 39–117)
BUN/Creatinine Ratio: 21 (ref 10–24)
BUN: 26 mg/dL (ref 8–27)
Bilirubin Total: 0.3 mg/dL (ref 0.0–1.2)
CALCIUM: 9.7 mg/dL (ref 8.6–10.2)
CO2: 24 mmol/L (ref 20–29)
CREATININE: 1.26 mg/dL (ref 0.76–1.27)
Chloride: 101 mmol/L (ref 96–106)
GFR, EST AFRICAN AMERICAN: 71 mL/min/{1.73_m2} (ref >59)
GFR, EST NON AFRICAN AMERICAN: 62 mL/min/{1.73_m2} (ref >59)
Globulin, Total: 2.5 g/dL (ref 1.5–4.5)
Glucose: 82 mg/dL (ref 65–99)
POTASSIUM: 4.2 mmol/L (ref 3.5–5.2)
Sodium: 141 mmol/L (ref 134–144)
TOTAL PROTEIN: 7 g/dL (ref 6.0–8.5)

## 2018-07-30 DIAGNOSIS — S42254D Nondisplaced fracture of greater tuberosity of right humerus, subsequent encounter for fracture with routine healing: Secondary | ICD-10-CM | POA: Diagnosis not present

## 2018-07-31 DIAGNOSIS — R200 Anesthesia of skin: Secondary | ICD-10-CM | POA: Diagnosis not present

## 2018-07-31 DIAGNOSIS — M7501 Adhesive capsulitis of right shoulder: Secondary | ICD-10-CM | POA: Diagnosis not present

## 2018-08-10 DIAGNOSIS — G5601 Carpal tunnel syndrome, right upper limb: Secondary | ICD-10-CM | POA: Diagnosis not present

## 2018-08-17 DIAGNOSIS — S42254D Nondisplaced fracture of greater tuberosity of right humerus, subsequent encounter for fracture with routine healing: Secondary | ICD-10-CM | POA: Diagnosis not present

## 2018-08-17 DIAGNOSIS — G5601 Carpal tunnel syndrome, right upper limb: Secondary | ICD-10-CM | POA: Diagnosis not present

## 2018-08-18 ENCOUNTER — Telehealth: Payer: Self-pay | Admitting: Nurse Practitioner

## 2018-08-18 NOTE — Telephone Encounter (Signed)
Pt aware of results 

## 2018-08-28 DIAGNOSIS — Z4789 Encounter for other orthopedic aftercare: Secondary | ICD-10-CM | POA: Insufficient documentation

## 2018-09-10 DIAGNOSIS — M7501 Adhesive capsulitis of right shoulder: Secondary | ICD-10-CM | POA: Diagnosis not present

## 2018-09-10 DIAGNOSIS — G8918 Other acute postprocedural pain: Secondary | ICD-10-CM | POA: Diagnosis not present

## 2018-09-10 DIAGNOSIS — M94211 Chondromalacia, right shoulder: Secondary | ICD-10-CM | POA: Diagnosis not present

## 2018-09-10 DIAGNOSIS — M7521 Bicipital tendinitis, right shoulder: Secondary | ICD-10-CM | POA: Diagnosis not present

## 2018-09-10 DIAGNOSIS — M24111 Other articular cartilage disorders, right shoulder: Secondary | ICD-10-CM | POA: Diagnosis not present

## 2018-09-11 DIAGNOSIS — S42254D Nondisplaced fracture of greater tuberosity of right humerus, subsequent encounter for fracture with routine healing: Secondary | ICD-10-CM | POA: Diagnosis not present

## 2018-09-12 DIAGNOSIS — S42254D Nondisplaced fracture of greater tuberosity of right humerus, subsequent encounter for fracture with routine healing: Secondary | ICD-10-CM | POA: Diagnosis not present

## 2018-09-14 DIAGNOSIS — S42254D Nondisplaced fracture of greater tuberosity of right humerus, subsequent encounter for fracture with routine healing: Secondary | ICD-10-CM | POA: Diagnosis not present

## 2018-09-15 DIAGNOSIS — S42254D Nondisplaced fracture of greater tuberosity of right humerus, subsequent encounter for fracture with routine healing: Secondary | ICD-10-CM | POA: Diagnosis not present

## 2018-09-17 DIAGNOSIS — S42254D Nondisplaced fracture of greater tuberosity of right humerus, subsequent encounter for fracture with routine healing: Secondary | ICD-10-CM | POA: Diagnosis not present

## 2018-09-22 DIAGNOSIS — S42254D Nondisplaced fracture of greater tuberosity of right humerus, subsequent encounter for fracture with routine healing: Secondary | ICD-10-CM | POA: Diagnosis not present

## 2018-09-24 DIAGNOSIS — S42254D Nondisplaced fracture of greater tuberosity of right humerus, subsequent encounter for fracture with routine healing: Secondary | ICD-10-CM | POA: Diagnosis not present

## 2018-09-29 DIAGNOSIS — M25561 Pain in right knee: Secondary | ICD-10-CM | POA: Diagnosis not present

## 2018-09-29 DIAGNOSIS — S42254D Nondisplaced fracture of greater tuberosity of right humerus, subsequent encounter for fracture with routine healing: Secondary | ICD-10-CM | POA: Diagnosis not present

## 2018-10-01 DIAGNOSIS — S42254D Nondisplaced fracture of greater tuberosity of right humerus, subsequent encounter for fracture with routine healing: Secondary | ICD-10-CM | POA: Diagnosis not present

## 2018-10-06 DIAGNOSIS — S42254D Nondisplaced fracture of greater tuberosity of right humerus, subsequent encounter for fracture with routine healing: Secondary | ICD-10-CM | POA: Diagnosis not present

## 2018-10-13 DIAGNOSIS — S42254D Nondisplaced fracture of greater tuberosity of right humerus, subsequent encounter for fracture with routine healing: Secondary | ICD-10-CM | POA: Diagnosis not present

## 2018-10-15 DIAGNOSIS — S42254D Nondisplaced fracture of greater tuberosity of right humerus, subsequent encounter for fracture with routine healing: Secondary | ICD-10-CM | POA: Diagnosis not present

## 2018-10-20 DIAGNOSIS — S42254D Nondisplaced fracture of greater tuberosity of right humerus, subsequent encounter for fracture with routine healing: Secondary | ICD-10-CM | POA: Diagnosis not present

## 2018-10-22 DIAGNOSIS — S42254D Nondisplaced fracture of greater tuberosity of right humerus, subsequent encounter for fracture with routine healing: Secondary | ICD-10-CM | POA: Diagnosis not present

## 2018-10-27 DIAGNOSIS — S42254D Nondisplaced fracture of greater tuberosity of right humerus, subsequent encounter for fracture with routine healing: Secondary | ICD-10-CM | POA: Diagnosis not present

## 2018-10-29 DIAGNOSIS — S42254D Nondisplaced fracture of greater tuberosity of right humerus, subsequent encounter for fracture with routine healing: Secondary | ICD-10-CM | POA: Diagnosis not present

## 2018-10-30 ENCOUNTER — Other Ambulatory Visit: Payer: Self-pay | Admitting: Nurse Practitioner

## 2018-10-30 DIAGNOSIS — F410 Panic disorder [episodic paroxysmal anxiety] without agoraphobia: Secondary | ICD-10-CM

## 2018-11-03 DIAGNOSIS — S42254D Nondisplaced fracture of greater tuberosity of right humerus, subsequent encounter for fracture with routine healing: Secondary | ICD-10-CM | POA: Diagnosis not present

## 2019-01-19 ENCOUNTER — Other Ambulatory Visit: Payer: Self-pay | Admitting: Nurse Practitioner

## 2019-01-19 DIAGNOSIS — F410 Panic disorder [episodic paroxysmal anxiety] without agoraphobia: Secondary | ICD-10-CM

## 2019-01-27 ENCOUNTER — Other Ambulatory Visit: Payer: Self-pay | Admitting: *Deleted

## 2019-01-29 DIAGNOSIS — S42254D Nondisplaced fracture of greater tuberosity of right humerus, subsequent encounter for fracture with routine healing: Secondary | ICD-10-CM | POA: Diagnosis not present

## 2019-02-04 ENCOUNTER — Other Ambulatory Visit: Payer: Self-pay | Admitting: Nurse Practitioner

## 2019-02-04 DIAGNOSIS — E785 Hyperlipidemia, unspecified: Secondary | ICD-10-CM

## 2019-02-04 DIAGNOSIS — I25810 Atherosclerosis of coronary artery bypass graft(s) without angina pectoris: Secondary | ICD-10-CM

## 2019-02-04 DIAGNOSIS — I10 Essential (primary) hypertension: Secondary | ICD-10-CM

## 2019-02-04 NOTE — Telephone Encounter (Signed)
MMM. NTBS for 6 mos FU. 30 days sent to pharmacy
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
## 2019-03-06 ENCOUNTER — Other Ambulatory Visit: Payer: Self-pay | Admitting: Nurse Practitioner

## 2019-03-06 DIAGNOSIS — I10 Essential (primary) hypertension: Secondary | ICD-10-CM

## 2019-03-08 NOTE — Telephone Encounter (Signed)
Aware ntbs

## 2019-03-08 NOTE — Telephone Encounter (Signed)
MMM. NTBS 30 days given 02/04/19

## 2019-03-25 ENCOUNTER — Other Ambulatory Visit: Payer: Self-pay | Admitting: Nurse Practitioner

## 2019-03-25 DIAGNOSIS — I10 Essential (primary) hypertension: Secondary | ICD-10-CM

## 2019-03-25 MED ORDER — LOSARTAN POTASSIUM 100 MG PO TABS: 100.0000 mg | ORAL_TABLET | Freq: Every day | ORAL | 0 refills | Status: DC

## 2019-04-05 ENCOUNTER — Other Ambulatory Visit: Payer: Self-pay

## 2019-04-06 ENCOUNTER — Encounter: Payer: Self-pay | Admitting: Nurse Practitioner

## 2019-04-06 ENCOUNTER — Ambulatory Visit: Payer: BLUE CROSS/BLUE SHIELD | Admitting: Nurse Practitioner

## 2019-04-06 VITALS — BP 148/80 | HR 59 | Temp 97.7°F | Ht 68.0 in | Wt 192.0 lb

## 2019-04-06 DIAGNOSIS — Z720 Tobacco use: Secondary | ICD-10-CM

## 2019-04-06 DIAGNOSIS — F411 Generalized anxiety disorder: Secondary | ICD-10-CM | POA: Diagnosis not present

## 2019-04-06 DIAGNOSIS — I10 Essential (primary) hypertension: Secondary | ICD-10-CM | POA: Diagnosis not present

## 2019-04-06 DIAGNOSIS — E785 Hyperlipidemia, unspecified: Secondary | ICD-10-CM

## 2019-04-06 DIAGNOSIS — E669 Obesity, unspecified: Secondary | ICD-10-CM | POA: Insufficient documentation

## 2019-04-06 DIAGNOSIS — E66811 Obesity, class 1: Secondary | ICD-10-CM | POA: Insufficient documentation

## 2019-04-06 DIAGNOSIS — F410 Panic disorder [episodic paroxysmal anxiety] without agoraphobia: Secondary | ICD-10-CM

## 2019-04-06 DIAGNOSIS — K5900 Constipation, unspecified: Secondary | ICD-10-CM

## 2019-04-06 DIAGNOSIS — I25810 Atherosclerosis of coronary artery bypass graft(s) without angina pectoris: Secondary | ICD-10-CM | POA: Diagnosis not present

## 2019-04-06 DIAGNOSIS — Z6829 Body mass index (BMI) 29.0-29.9, adult: Secondary | ICD-10-CM

## 2019-04-06 MED ORDER — AMLODIPINE BESYLATE 10 MG PO TABS: 10.0000 mg | ORAL_TABLET | Freq: Every day | ORAL | 1 refills | Status: DC

## 2019-04-06 MED ORDER — HYDROCHLOROTHIAZIDE 25 MG PO TABS: 25.0000 mg | ORAL_TABLET | Freq: Every day | ORAL | 1 refills | Status: DC

## 2019-04-06 MED ORDER — ALPRAZOLAM 0.5 MG PO TABS: 0.5000 mg | ORAL_TABLET | Freq: Two times a day (BID) | ORAL | 1 refills | Status: DC | PRN

## 2019-04-06 MED ORDER — METOPROLOL SUCCINATE ER 25 MG PO TB24: 25.0000 mg | ORAL_TABLET | Freq: Every day | ORAL | 1 refills | Status: DC

## 2019-04-06 MED ORDER — ESCITALOPRAM OXALATE 10 MG PO TABS: 10.0000 mg | ORAL_TABLET | Freq: Every day | ORAL | 1 refills | Status: DC

## 2019-04-06 MED ORDER — LOSARTAN POTASSIUM 100 MG PO TABS: 100.0000 mg | ORAL_TABLET | Freq: Every day | ORAL | 0 refills | Status: DC

## 2019-04-06 MED ORDER — FENOFIBRATE 160 MG PO TABS: 160.0000 mg | ORAL_TABLET | Freq: Every day | ORAL | 1 refills | Status: DC

## 2019-04-06 MED ORDER — PRAVASTATIN SODIUM 40 MG PO TABS: 40.0000 mg | ORAL_TABLET | Freq: Every day | ORAL | 1 refills | Status: DC
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
## 2019-04-06 NOTE — Patient Instructions (Signed)
Steps to Quit Smoking Smoking tobacco is the leading cause of preventable death. It can affect almost every organ in the body. Smoking puts you and people around you at risk for many serious, long-lasting (chronic) diseases. Quitting smoking can be hard, but it is one of the best things that you can do for your health. It is never too late to quit. How do I get ready to quit? When you decide to quit smoking, make a plan to help you succeed. Before you quit:  Pick a date to quit. Set a date within the next 2 weeks to give you time to prepare.  Write down the reasons why you are quitting. Keep this list in places where you will see it often.  Tell your family, friends, and co-workers that you are quitting. Their support is important.  Talk with your doctor about the choices that may help you quit.  Find out if your health insurance will pay for these treatments.  Know the people, places, things, and activities that make you want to smoke (triggers). Avoid them. What first steps can I take to quit smoking?  Throw away all cigarettes at home, at work, and in your car.  Throw away the things that you use when you smoke, such as ashtrays and lighters.  Clean your car. Make sure to empty the ashtray.  Clean your home, including curtains and carpets. What can I do to help me quit smoking? Talk with your doctor about taking medicines and seeing a counselor at the same time. You are more likely to succeed when you do both.  If you are pregnant or breastfeeding, talk with your doctor about counseling or other ways to quit smoking. Do not take medicine to help you quit smoking unless your doctor tells you to do so. To quit smoking: Quit right away  Quit smoking totally, instead of slowly cutting back on how much you smoke over a period of time.  Go to counseling. You are more likely to quit if you go to counseling sessions regularly. Take medicine You may take medicines to help you quit. Some  medicines need a prescription, and some you can buy over-the-counter. Some medicines may contain a drug called nicotine to replace the nicotine in cigarettes. Medicines may:  Help you to stop having the desire to smoke (cravings).  Help to stop the problems that come when you stop smoking (withdrawal symptoms). Your doctor may ask you to use:  Nicotine patches, gum, or lozenges.  Nicotine inhalers or sprays.  Non-nicotine medicine that is taken by mouth. Find resources Find resources and other ways to help you quit smoking and remain smoke-free after you quit. These resources are most helpful when you use them often. They include:  Online chats with a counselor.  Phone quitlines.  Printed self-help materials.  Support groups or group counseling.  Text messaging programs.  Mobile phone apps. Use apps on your mobile phone or tablet that can help you stick to your quit plan. There are many free apps for mobile phones and tablets as well as websites. Examples include Quit Guide from the CDC and smokefree.gov  What things can I do to make it easier to quit?   Talk to your family and friends. Ask them to support and encourage you.  Call a phone quitline (1-800-QUIT-NOW), reach out to support groups, or work with a counselor.  Ask people who smoke to not smoke around you.  Avoid places that make you want to smoke,   such as: ? Bars. ? Parties. ? Smoke-break areas at work.  Spend time with people who do not smoke.  Lower the stress in your life. Stress can make you want to smoke. Try these things to help your stress: ? Getting regular exercise. ? Doing deep-breathing exercises. ? Doing yoga. ? Meditating. ? Doing a body scan. To do this, close your eyes, focus on one area of your body at a time from head to toe. Notice which parts of your body are tense. Try to relax the muscles in those areas. How will I feel when I quit smoking? Day 1 to 3 weeks Within the first 24 hours,  you may start to have some problems that come from quitting tobacco. These problems are very bad 2-3 days after you quit, but they do not often last for more than 2-3 weeks. You may get these symptoms:  Mood swings.  Feeling restless, nervous, angry, or annoyed.  Trouble concentrating.  Dizziness.  Strong desire for high-sugar foods and nicotine.  Weight gain.  Trouble pooping (constipation).  Feeling like you may vomit (nausea).  Coughing or a sore throat.  Changes in how the medicines that you take for other issues work in your body.  Depression.  Trouble sleeping (insomnia). Week 3 and afterward After the first 2-3 weeks of quitting, you may start to notice more positive results, such as:  Better sense of smell and taste.  Less coughing and sore throat.  Slower heart rate.  Lower blood pressure.  Clearer skin.  Better breathing.  Fewer sick days. Quitting smoking can be hard. Do not give up if you fail the first time. Some people need to try a few times before they succeed. Do your best to stick to your quit plan, and talk with your doctor if you have any questions or concerns. Summary  Smoking tobacco is the leading cause of preventable death. Quitting smoking can be hard, but it is one of the best things that you can do for your health.  When you decide to quit smoking, make a plan to help you succeed.  Quit smoking right away, not slowly over a period of time.  When you start quitting, seek help from your doctor, family, or friends. This information is not intended to replace advice given to you by your health care provider. Make sure you discuss any questions you have with your health care provider. Document Released: 06/29/2009 Document Revised: 11/20/2018 Document Reviewed: 11/21/2018 Elsevier Patient Education  2020 Elsevier Inc.  

## 2019-04-06 NOTE — Addendum Note (Signed)
Addended by: [name] on: 04/06/2019 09:03 AM   Modules accepted: Orders

## 2019-04-07 LAB — LIPID PANEL
Chol/HDL Ratio: 4.8 ratio (ref 0.0–5.0)
Cholesterol, Total: 205 mg/dL — ABNORMAL HIGH (ref 100–199)
HDL: 43 mg/dL (ref >39)
LDL Calculated: 127 mg/dL — ABNORMAL HIGH (ref 0–99)
Triglycerides: 173 mg/dL — ABNORMAL HIGH (ref 0–149)
VLDL Cholesterol Cal: 35 mg/dL (ref 5–40)

## 2019-04-07 LAB — CMP14+EGFR
ALT: 20 IU/L (ref 0–44)
AST: 16 IU/L (ref 0–40)
Albumin/Globulin Ratio: 1.8 (ref 1.2–2.2)
Albumin: 4.8 g/dL (ref 3.8–4.9)
Alkaline Phosphatase: 54 IU/L (ref 39–117)
BUN/Creatinine Ratio: 20 (ref 10–24)
BUN: 23 mg/dL (ref 8–27)
Bilirubin Total: 0.2 mg/dL (ref 0.0–1.2)
CO2: 20 mmol/L (ref 20–29)
Calcium: 9.9 mg/dL (ref 8.6–10.2)
Chloride: 103 mmol/L (ref 96–106)
Creatinine, Ser: 1.13 mg/dL (ref 0.76–1.27)
GFR calc Af Amer: 81 mL/min/{1.73_m2} (ref >59)
GFR calc non Af Amer: 70 mL/min/{1.73_m2} (ref >59)
Globulin, Total: 2.6 g/dL (ref 1.5–4.5)
Glucose: 89 mg/dL (ref 65–99)
Potassium: 4.7 mmol/L (ref 3.5–5.2)
Sodium: 143 mmol/L (ref 134–144)
Total Protein: 7.4 g/dL (ref 6.0–8.5)

## 2019-04-16 ENCOUNTER — Other Ambulatory Visit: Payer: Self-pay | Admitting: Nurse Practitioner

## 2019-04-16 DIAGNOSIS — I10 Essential (primary) hypertension: Secondary | ICD-10-CM

## 2019-05-07 DIAGNOSIS — S42254D Nondisplaced fracture of greater tuberosity of right humerus, subsequent encounter for fracture with routine healing: Secondary | ICD-10-CM | POA: Diagnosis not present
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
## 2019-05-11 ENCOUNTER — Ambulatory Visit: Payer: BC Managed Care – PPO | Admitting: Cardiology

## 2019-05-11 ENCOUNTER — Encounter: Payer: Self-pay | Admitting: Cardiology

## 2019-05-11 ENCOUNTER — Other Ambulatory Visit: Payer: Self-pay

## 2019-05-11 VITALS — BP 132/70 | HR 55 | Ht 68.0 in | Wt 192.0 lb

## 2019-05-11 DIAGNOSIS — I10 Essential (primary) hypertension: Secondary | ICD-10-CM

## 2019-05-11 DIAGNOSIS — R0602 Shortness of breath: Secondary | ICD-10-CM | POA: Diagnosis not present

## 2019-05-11 DIAGNOSIS — I25119 Atherosclerotic heart disease of native coronary artery with unspecified angina pectoris: Secondary | ICD-10-CM

## 2019-05-11 DIAGNOSIS — R0609 Other forms of dyspnea: Secondary | ICD-10-CM | POA: Diagnosis not present

## 2019-05-11 DIAGNOSIS — Z720 Tobacco use: Secondary | ICD-10-CM | POA: Diagnosis not present

## 2019-05-11 DIAGNOSIS — R0600 Dyspnea, unspecified: Secondary | ICD-10-CM

## 2019-05-11 DIAGNOSIS — E785 Hyperlipidemia, unspecified: Secondary | ICD-10-CM

## 2019-05-11 MED ORDER — VARENICLINE TARTRATE 0.5 MG X 11 & 1 MG X 42 PO MISC: ORAL | 0 refills | Status: DC

## 2019-05-11 MED ORDER — VARENICLINE TARTRATE 1 MG PO TABS: 1.0000 mg | ORAL_TABLET | Freq: Two times a day (BID) | ORAL | 3 refills | Status: DC

## 2019-05-11 NOTE — Patient Instructions (Addendum)
Medication Instructions:  START CHANTIX AS DIRECTED ON PACKAGE FOR FIRST MONTH  THEN CHANTIX TWICE DAILY ONCE COMPLETED STARTER PACK   If you need a refill on your cardiac medications before your next appointment, please call your pharmacy.   Lab work: FASTING LIPID WHEN YOU RETURN FOR TREADMILL  COVID SCREENING 3 DAYS BEFORE YOUR TREADMILL AT [place]   If you have labs (blood work) drawn today and your tests are completely normal, you will receive your results only by: Marland Kitchen MyChart Message (if you have MyChart) OR . A paper copy in the mail If you have any lab test that is abnormal or we need to change your treatment, we will call you to review the results.  Testing/Procedures: Your physician has requested that you have an exercise tolerance test. For further information please visit [email or web address]. Please also follow instruction sheet, as given.  Follow-Up: At [facility], you and your health needs are our priority.  As part of our continuing mission to provide you with exceptional heart care, we have created designated Provider Care Teams.  These Care Teams include your primary Cardiologist (physician) and Advanced Practice Providers (APPs -  Physician Assistants and Nurse Practitioners) who all work together to provide you with the care you need, when you need it. You will need a follow up appointment in 6 months.  Please call our office 2 months in advance to schedule this appointment.  You may see DR [facility]  or one of the following Advanced Practice Providers on your designated Care Team:   Rosaria Ferries, PA-C . Jory Sims, DNP, ANP

## 2019-05-11 NOTE — Addendum Note (Signed)
Addended by: Earvin Hansen on: 05/11/2019 06:34 PM   Modules accepted: Orders

## 2019-05-26 ENCOUNTER — Encounter (HOSPITAL_COMMUNITY): Payer: BC Managed Care – PPO

## 2019-06-01 ENCOUNTER — Other Ambulatory Visit (HOSPITAL_COMMUNITY)
Admission: RE | Admit: 2019-06-01 | Discharge: 2019-06-01 | Disposition: A | Discharge status: Home / Self Care | Payer: BC Managed Care – PPO | Source: Ambulatory Visit | Attending: Cardiology | Admitting: Cardiology

## 2019-06-01 DIAGNOSIS — Z01812 Encounter for preprocedural laboratory examination: Secondary | ICD-10-CM | POA: Insufficient documentation

## 2019-06-01 DIAGNOSIS — Z20828 Contact with and (suspected) exposure to other viral communicable diseases: Secondary | ICD-10-CM | POA: Diagnosis not present

## 2019-06-02 ENCOUNTER — Telehealth (HOSPITAL_COMMUNITY): Payer: Self-pay

## 2019-06-02 LAB — NOVEL CORONAVIRUS, NAA (HOSP ORDER, SEND-OUT TO REF LAB; TAT 18-24 HRS): SARS-CoV-2, NAA: NOT DETECTED

## 2019-06-02 NOTE — Telephone Encounter (Signed)
Encounter complete. 

## 2019-06-04 ENCOUNTER — Ambulatory Visit (HOSPITAL_COMMUNITY)
Admission: RE | Admit: 2019-06-04 | Discharge: 2019-06-04 | Disposition: A | Discharge status: Home / Self Care | Payer: BC Managed Care – PPO | Source: Ambulatory Visit | Attending: Cardiology | Admitting: Cardiology

## 2019-06-04 ENCOUNTER — Other Ambulatory Visit: Payer: Self-pay

## 2019-06-04 DIAGNOSIS — R0609 Other forms of dyspnea: Secondary | ICD-10-CM | POA: Diagnosis not present

## 2019-06-04 DIAGNOSIS — R0600 Dyspnea, unspecified: Secondary | ICD-10-CM

## 2019-06-04 LAB — EXERCISE TOLERANCE TEST
Estimated workload: 11.7 METS
Exercise duration (min): 10 min
Exercise duration (sec): 0 s
MPHR: 160 {beats}/min
Peak HR: 139 {beats}/min
Percent HR: 87 %
Rest HR: 50 {beats}/min

## 2019-06-09 ENCOUNTER — Telehealth: Payer: Self-pay | Admitting: Cardiology

## 2019-06-09 NOTE — Telephone Encounter (Signed)
New Message       [facility] Pre-operative Risk Assessment    Request for surgical clearance:  1. What type of surgery is being performed? Functional Compassity Evaluation  2. When is this surgery scheduled? TBD  3. What type of clearance is required (medical clearance vs. Pharmacy clearance to hold med vs. Both)? Medical  4. Are there any medications that need to be held prior to surgery and how long? No  5. Practice name and name of physician performing surgery? [facility] Dr. Freddi Che  6. What is your office phone number [phone]   7.   What is your office fax number [phone]  8.   Anesthesia type (None, local, MAC, general) ? None   Andree Coss 06/09/2019, 12:06 PM  _________________________________________________________________   (provider comments below)  =

## 2019-06-09 NOTE — Telephone Encounter (Signed)
New message   Patient would like a call to go over results for the ETT.

## 2019-06-09 NOTE — Telephone Encounter (Signed)
Attempted to return call to patient to provide results below (that were released to [facility]). Phone rang but no answer   Notes recorded by Minus Breeding, MD on 06/06/2019 at 2:12 PM EDT  No evidence of ischemia. No change in therapy. No further testing. Call Mr. Steger with the results and send results to [name], FNP

## 2019-06-10 NOTE — Telephone Encounter (Signed)
   Primary Cardiologist: Minus Breeding, MD  Chart reviewed as part of pre-operative protocol coverage. Patient was contacted 06/10/2019 in reference to pre-operative risk assessment for pending surgery as outlined below.  Eric Saunders was last seen on 05/11/19 by Dr. Percival Spanish.  Since that day, Eric Saunders has done well. Due to some reported DOE, he underwent exercise tolerance test that was low risk. He has also not smoked in 18 days. Congratulated him on his success.  Therefore, based on ACC/AHA guidelines, the patient would be at acceptable risk for the planned procedure without further cardiovascular testing.   I will route this recommendation to the requesting party via Epic fax function and remove from pre-op pool.  Please call with questions.  Tami Lin Duke, PA 06/10/2019, 8:45 AM

## 2019-06-11 NOTE — Telephone Encounter (Signed)
Pt notified of release for Functional Compassity Evaluation with [facility]

## 2019-06-11 NOTE — Telephone Encounter (Signed)
Pt notified of Dr Hochrein's  Notes and ok to have Functional Compassity Evaluation with [facility](see note dated 06-09-19)

## 2019-06-30 DIAGNOSIS — M25511 Pain in right shoulder: Secondary | ICD-10-CM | POA: Diagnosis not present

## 2019-07-07 DIAGNOSIS — M7501 Adhesive capsulitis of right shoulder: Secondary | ICD-10-CM | POA: Diagnosis not present

## 2019-08-02 DIAGNOSIS — M25512 Pain in left shoulder: Secondary | ICD-10-CM | POA: Diagnosis not present

## 2019-08-02 DIAGNOSIS — M545 Low back pain, unspecified: Secondary | ICD-10-CM | POA: Insufficient documentation

## 2019-08-02 DIAGNOSIS — M7542 Impingement syndrome of left shoulder: Secondary | ICD-10-CM | POA: Insufficient documentation

## 2019-08-09 DIAGNOSIS — M545 Low back pain: Secondary | ICD-10-CM | POA: Diagnosis not present

## 2019-08-10 ENCOUNTER — Other Ambulatory Visit: Payer: Self-pay | Admitting: Orthopedic Surgery

## 2019-08-10 DIAGNOSIS — M545 Low back pain, unspecified: Secondary | ICD-10-CM

## 2019-08-18 DIAGNOSIS — M545 Low back pain: Secondary | ICD-10-CM | POA: Diagnosis not present

## 2019-08-18 DIAGNOSIS — M7542 Impingement syndrome of left shoulder: Secondary | ICD-10-CM | POA: Diagnosis not present

## 2019-08-19 ENCOUNTER — Ambulatory Visit
Admission: RE | Admit: 2019-08-19 | Discharge: 2019-08-19 | Disposition: A | Discharge status: Home / Self Care | Payer: BC Managed Care – PPO | Source: Ambulatory Visit | Attending: Orthopedic Surgery | Admitting: Orthopedic Surgery

## 2019-08-19 ENCOUNTER — Other Ambulatory Visit (HOSPITAL_COMMUNITY): Payer: Self-pay | Admitting: Orthopedic Surgery

## 2019-08-19 ENCOUNTER — Other Ambulatory Visit: Payer: Self-pay

## 2019-08-19 ENCOUNTER — Other Ambulatory Visit: Payer: Self-pay | Admitting: Orthopedic Surgery

## 2019-08-19 DIAGNOSIS — M545 Low back pain, unspecified: Secondary | ICD-10-CM

## 2019-08-19 DIAGNOSIS — M999 Biomechanical lesion, unspecified: Secondary | ICD-10-CM

## 2019-08-21 DIAGNOSIS — M999 Biomechanical lesion, unspecified: Secondary | ICD-10-CM | POA: Diagnosis not present

## 2019-08-23 ENCOUNTER — Ambulatory Visit (INDEPENDENT_AMBULATORY_CARE_PROVIDER_SITE_OTHER): Payer: BC Managed Care – PPO | Admitting: Family Medicine

## 2019-08-23 ENCOUNTER — Ambulatory Visit (INDEPENDENT_AMBULATORY_CARE_PROVIDER_SITE_OTHER): Payer: BC Managed Care – PPO

## 2019-08-23 ENCOUNTER — Other Ambulatory Visit: Payer: Self-pay

## 2019-08-23 ENCOUNTER — Encounter: Payer: Self-pay | Admitting: Family Medicine

## 2019-08-23 VITALS — BP 179/90 | HR 66 | Temp 99.3°F | Ht 68.0 in | Wt 193.8 lb

## 2019-08-23 DIAGNOSIS — S93492A Sprain of other ligament of left ankle, initial encounter: Secondary | ICD-10-CM

## 2019-08-23 DIAGNOSIS — R0989 Other specified symptoms and signs involving the circulatory and respiratory systems: Secondary | ICD-10-CM

## 2019-08-23 DIAGNOSIS — R1310 Dysphagia, unspecified: Secondary | ICD-10-CM | POA: Diagnosis not present

## 2019-08-23 DIAGNOSIS — R55 Syncope and collapse: Secondary | ICD-10-CM | POA: Diagnosis not present

## 2019-08-23 DIAGNOSIS — R198 Other specified symptoms and signs involving the digestive system and abdomen: Secondary | ICD-10-CM

## 2019-08-23 DIAGNOSIS — K219 Gastro-esophageal reflux disease without esophagitis: Secondary | ICD-10-CM | POA: Diagnosis not present

## 2019-08-23 DIAGNOSIS — M79672 Pain in left foot: Secondary | ICD-10-CM | POA: Diagnosis not present

## 2019-08-23 DIAGNOSIS — M19072 Primary osteoarthritis, left ankle and foot: Secondary | ICD-10-CM | POA: Diagnosis not present

## 2019-08-23 DIAGNOSIS — M999 Biomechanical lesion, unspecified: Secondary | ICD-10-CM | POA: Diagnosis not present

## 2019-08-23 MED ORDER — OMEPRAZOLE 20 MG PO CPDR: 20.0000 mg | DELAYED_RELEASE_CAPSULE | Freq: Every day | ORAL | 3 refills | Status: DC

## 2019-08-23 NOTE — Progress Notes (Signed)
BP (!) 179/90    Pulse 66    Temp 99.3 F (37.4 C) (Temporal)    Ht '5\' 8"'  (1.727 m)    Wt 193 lb 12.8 oz (87.9 kg)    SpO2 99%    BMI 29.47 kg/m    Subjective:   Patient ID: Eric Saunders, male    DOB: 11/04/1955, 63 y.o.   MRN: [id number]  HPI: Eric Saunders is a 63 y.o. male presenting on 08/23/2019 for passed out (Patient states last night while he was cooking out he passed out.  States he fell and c/o left foot pain.  EMS was called- states that his esophageus needs to be cheked since patient felt like there was something caught in his throat)   HPI Patient comes in complaining of a syncope episode that occurred yesterday.  He says he was eating and sitting down when this occurred and he felt like he got choked up on something and started coughing significantly and then passed out and hit the floor.  He came right to almost immediately per family and he was coherent and trying to clean up almost immediately.  Patient's family called EMT and was evaluated by EMT.  He says that he has had some acid reflux issues for quite some time in his life and is not taking any medication for it.  He says he gets a lot of belching and burping and occasionally gets some food stuck in his throat but not consistently.  He says he has had some coughing spells like this before because of it but nothing consistent.  Patient has ENT tracing and had blood sugar blood pressure checked there in his home yesterday.  He also feels like somehow he hurt his foot and the whole process has a little bit of pain on the outside of his left foot, he feels like he twisted it but thought he might as well get an x-ray since he was here.  He has had no lightheadedness or dizziness since yesterday or further syncope.  He does still have a mild cough and feels like something is in his throat.  Relevant past medical, surgical, family and social history reviewed and updated as indicated. Interim medical history since our last visit  reviewed. Allergies and medications reviewed and updated.  Review of Systems  Constitutional: Negative for chills and fever.  HENT: Positive for trouble swallowing.   Eyes: Negative for visual disturbance.  Respiratory: Positive for cough. Negative for shortness of breath and wheezing.   Cardiovascular: Negative for chest pain and leg swelling.  Musculoskeletal: Positive for arthralgias and joint swelling.  Neurological: Positive for syncope. Negative for dizziness, weakness, light-headedness and numbness.  All other systems reviewed and are negative.   Per HPI unless specifically indicated above   Allergies as of 08/23/2019      Reactions   Ace Inhibitors Cough   Other       Medication List       Accurate as of August 23, 2019 10:40 AM. If you have any questions, ask your nurse or doctor.        ALPRAZolam 0.5 MG tablet Commonly known as: Xanax Take 1 tablet (0.5 mg total) by mouth 2 (two) times daily as needed for anxiety.   amLODipine 10 MG tablet Commonly known as: NORVASC Take 1 tablet (10 mg total) by mouth daily.   aspirin 81 MG tablet Take 81 mg by mouth daily.   cyclobenzaprine 10 MG tablet Commonly known  as: FLEXERIL Take 1 tablet (10 mg total) by mouth 3 (three) times daily as needed for muscle spasms.   escitalopram 10 MG tablet Commonly known as: LEXAPRO Take 1 tablet (10 mg total) by mouth daily.   fenofibrate 160 MG tablet Take 1 tablet (160 mg total) by mouth daily.   hydrochlorothiazide 25 MG tablet Commonly known as: HYDRODIURIL Take 1 tablet (25 mg total) by mouth daily.   ibuprofen 800 MG tablet Commonly known as: IBU TAKE ONE TABLET BY MOUTH 3 TIMES DAILY AS NEEDED   losartan 100 MG tablet Commonly known as: COZAAR TAKE 1 TABLET BY MOUTH EVERY DAY   metoprolol succinate 25 MG 24 hr tablet Commonly known as: TOPROL-XL Take 1 tablet (25 mg total) by mouth daily.   omeprazole 20 MG capsule Commonly known as: PRILOSEC Take 1  capsule (20 mg total) by mouth daily. Started by: Fransisca Kaufmann Shanekqua Schaper, MD   pravastatin 40 MG tablet Commonly known as: PRAVACHOL Take 1 tablet (40 mg total) by mouth daily.   sildenafil 100 MG tablet Commonly known as: Viagra Take 0.5-1 tablets (50-100 mg total) by mouth daily as needed for erectile dysfunction.   varenicline 0.5 MG X 11 & 1 MG X 42 tablet Commonly known as: CHANTIX PAK Take one 0.5 mg tablet by mouth once daily X 3 days, then one 0.5 mg tablet twice daily X 4 days, then one 1 mg tablet twice daily.   varenicline 1 MG tablet Commonly known as: Chantix Continuing Month Pak Take 1 tablet (1 mg total) by mouth 2 (two) times daily.        Objective:   BP (!) 179/90    Pulse 66    Temp 99.3 F (37.4 C) (Temporal)    Ht '5\' 8"'  (1.727 m)    Wt 193 lb 12.8 oz (87.9 kg)    SpO2 99%    BMI 29.47 kg/m   Wt Readings from Last 3 Encounters:  08/23/19 193 lb 12.8 oz (87.9 kg)  05/11/19 192 lb (87.1 kg)  04/06/19 192 lb (87.1 kg)    Physical Exam Vitals signs and nursing note reviewed.  Constitutional:      General: He is not in acute distress.    Appearance: He is well-developed. He is not diaphoretic.  HENT:     Mouth/Throat:     Mouth: Mucous membranes are moist.     Pharynx: No oropharyngeal exudate or posterior oropharyngeal erythema.  Eyes:     General: No scleral icterus.       Right eye: No discharge.     Conjunctiva/sclera: Conjunctivae normal.  Neck:     Musculoskeletal: Neck supple.     Thyroid: No thyromegaly.  Cardiovascular:     Rate and Rhythm: Normal rate and regular rhythm.     Heart sounds: Normal heart sounds. No murmur.  Pulmonary:     Effort: Pulmonary effort is normal. No respiratory distress.     Breath sounds: Normal breath sounds. No wheezing.  Musculoskeletal: Normal range of motion.     Left ankle: He exhibits swelling. He exhibits normal range of motion and no deformity. Tenderness. AITFL tenderness found. Achilles tendon exhibits  no pain and no defect.  Lymphadenopathy:     Cervical: No cervical adenopathy.  Skin:    General: Skin is warm and dry.     Findings: No rash.  Neurological:     Mental Status: He is alert and oriented to person, place, and time.  Cranial Nerves: No cranial nerve deficit.     Sensory: No sensory deficit.     Motor: No weakness.     Coordination: Coordination normal.     Gait: Gait normal.  Psychiatric:        Behavior: Behavior normal.    Left ankle x-ray and soft tissue neck x-ray showed no acute abnormalities, await final read from radiology EMT tracing/EKG is normal sinus rhythm  Assessment & Plan:   Problem List Items Addressed This Visit    None    Visit Diagnoses    Vasovagal syncope    -  Primary   Relevant Orders   CBC with Differential/Platelet   BMP8+EGFR   Sprain of anterior talofibular ligament of left ankle, initial encounter       Relevant Orders   DG Foot Complete Left   Gastroesophageal reflux disease without esophagitis       Relevant Medications   omeprazole (PRILOSEC) 20 MG capsule   Other Relevant Orders   DG Neck Soft Tissue   Globus sensation       Relevant Medications   omeprazole (PRILOSEC) 20 MG capsule   Other Relevant Orders   CBC with Differential/Platelet   BMP8+EGFR      Patient had a coughing spell that led to syncope, likely vasovagal, it sounds like he fights GERD a lot and will try him on omeprazole and if that does not show improvement then he may need to go for EGD Follow up plan: Return in about 4 weeks (around 09/20/2019), or if symptoms worsen or fail to improve, for Hypertension follow-up with PCP.  Counseling provided for all of the vaccine components Orders Placed This Encounter  Procedures   DG Neck Soft Tissue   DG Foot Complete Left   CBC with Differential/Platelet   BMP8+EGFR    Caryl Pina, MD [facility] 08/23/2019, 10:40 AM

## 2019-08-23 NOTE — Patient Instructions (Signed)

## 2019-08-24 ENCOUNTER — Encounter (HOSPITAL_COMMUNITY)
Admission: RE | Admit: 2019-08-24 | Discharge: 2019-08-24 | Disposition: A | Discharge status: Home / Self Care | Payer: BC Managed Care – PPO | Source: Ambulatory Visit | Attending: Orthopedic Surgery | Admitting: Orthopedic Surgery

## 2019-08-24 DIAGNOSIS — M999 Biomechanical lesion, unspecified: Secondary | ICD-10-CM

## 2019-08-24 DIAGNOSIS — M545 Low back pain: Secondary | ICD-10-CM | POA: Diagnosis not present

## 2019-08-24 MED ORDER — TECHNETIUM TC 99M MEDRONATE IV KIT
20.0000 | PACK | Freq: Once | INTRAVENOUS | Status: AC | PRN
  Administered 2019-08-24: 12:00:00 20 via INTRAVENOUS

## 2019-08-31 DIAGNOSIS — M25571 Pain in right ankle and joints of right foot: Secondary | ICD-10-CM | POA: Diagnosis not present

## 2019-08-31 DIAGNOSIS — M545 Low back pain: Secondary | ICD-10-CM | POA: Diagnosis not present

## 2019-09-08 ENCOUNTER — Other Ambulatory Visit: Payer: Self-pay | Admitting: Family Medicine

## 2019-09-08 ENCOUNTER — Telehealth: Payer: Self-pay | Admitting: Nurse Practitioner

## 2019-09-08 DIAGNOSIS — R05 Cough: Secondary | ICD-10-CM

## 2019-09-08 DIAGNOSIS — R059 Cough, unspecified: Secondary | ICD-10-CM

## 2019-09-08 MED ORDER — BENZONATATE 100 MG PO CAPS: 100.0000 mg | ORAL_CAPSULE | Freq: Three times a day (TID) | ORAL | 0 refills | Status: DC | PRN

## 2019-09-08 NOTE — Telephone Encounter (Signed)
RX sent, if symptoms persist or worsen, he needs to be seen

## 2019-09-08 NOTE — Telephone Encounter (Signed)
Patient is c/o of cough he is taken Mucinex not helping much. He is not wanting to do a televisit he is asking for a refill for Benzonatel sent to [facility] in [facility]

## 2019-09-08 NOTE — Telephone Encounter (Signed)
Left detailed message.   

## 2019-09-23 ENCOUNTER — Other Ambulatory Visit: Payer: Self-pay | Admitting: Nurse Practitioner

## 2019-09-23 DIAGNOSIS — I10 Essential (primary) hypertension: Secondary | ICD-10-CM

## 2019-09-29 DIAGNOSIS — M7542 Impingement syndrome of left shoulder: Secondary | ICD-10-CM | POA: Diagnosis not present

## 2019-10-01 ENCOUNTER — Other Ambulatory Visit: Payer: Self-pay | Admitting: Nurse Practitioner

## 2019-10-01 DIAGNOSIS — I25810 Atherosclerosis of coronary artery bypass graft(s) without angina pectoris: Secondary | ICD-10-CM

## 2019-10-01 DIAGNOSIS — E785 Hyperlipidemia, unspecified: Secondary | ICD-10-CM

## 2019-10-01 DIAGNOSIS — I10 Essential (primary) hypertension: Secondary | ICD-10-CM

## 2019-10-06 ENCOUNTER — Other Ambulatory Visit: Payer: Self-pay

## 2019-10-07 ENCOUNTER — Encounter: Payer: Self-pay | Admitting: Nurse Practitioner

## 2019-10-07 ENCOUNTER — Other Ambulatory Visit: Payer: Self-pay

## 2019-10-07 ENCOUNTER — Ambulatory Visit (INDEPENDENT_AMBULATORY_CARE_PROVIDER_SITE_OTHER): Payer: BC Managed Care – PPO

## 2019-10-07 ENCOUNTER — Ambulatory Visit: Payer: BC Managed Care – PPO | Admitting: Nurse Practitioner

## 2019-10-07 VITALS — BP 146/88 | HR 60 | Temp 98.2°F | Resp 20 | Ht 68.0 in | Wt 207.0 lb

## 2019-10-07 DIAGNOSIS — Z720 Tobacco use: Secondary | ICD-10-CM | POA: Diagnosis not present

## 2019-10-07 DIAGNOSIS — R059 Cough, unspecified: Secondary | ICD-10-CM

## 2019-10-07 DIAGNOSIS — I10 Essential (primary) hypertension: Secondary | ICD-10-CM

## 2019-10-07 DIAGNOSIS — J411 Mucopurulent chronic bronchitis: Secondary | ICD-10-CM

## 2019-10-07 DIAGNOSIS — F411 Generalized anxiety disorder: Secondary | ICD-10-CM

## 2019-10-07 DIAGNOSIS — R05 Cough: Secondary | ICD-10-CM

## 2019-10-07 DIAGNOSIS — F17210 Nicotine dependence, cigarettes, uncomplicated: Secondary | ICD-10-CM | POA: Diagnosis not present

## 2019-10-07 DIAGNOSIS — I25810 Atherosclerosis of coronary artery bypass graft(s) without angina pectoris: Secondary | ICD-10-CM | POA: Diagnosis not present

## 2019-10-07 DIAGNOSIS — R198 Other specified symptoms and signs involving the digestive system and abdomen: Secondary | ICD-10-CM

## 2019-10-07 DIAGNOSIS — Z6831 Body mass index (BMI) 31.0-31.9, adult: Secondary | ICD-10-CM

## 2019-10-07 DIAGNOSIS — K219 Gastro-esophageal reflux disease without esophagitis: Secondary | ICD-10-CM

## 2019-10-07 DIAGNOSIS — R0989 Other specified symptoms and signs involving the circulatory and respiratory systems: Secondary | ICD-10-CM

## 2019-10-07 DIAGNOSIS — E785 Hyperlipidemia, unspecified: Secondary | ICD-10-CM | POA: Diagnosis not present

## 2019-10-07 DIAGNOSIS — F410 Panic disorder [episodic paroxysmal anxiety] without agoraphobia: Secondary | ICD-10-CM

## 2019-10-07 MED ORDER — PRAVASTATIN SODIUM 40 MG PO TABS: 40.0000 mg | ORAL_TABLET | Freq: Every day | ORAL | 1 refills | Status: DC

## 2019-10-07 MED ORDER — LEVALBUTEROL HCL 1.25 MG/3ML IN NEBU
1.2500 mg | INHALATION_SOLUTION | Freq: Once | RESPIRATORY_TRACT | Status: AC
  Administered 2019-10-07: 10:00:00 1.25 mg via RESPIRATORY_TRACT

## 2019-10-07 MED ORDER — BENZONATATE 100 MG PO CAPS: 100.0000 mg | ORAL_CAPSULE | Freq: Three times a day (TID) | ORAL | 0 refills | Status: DC | PRN

## 2019-10-07 MED ORDER — FENOFIBRATE 160 MG PO TABS: 160.0000 mg | ORAL_TABLET | Freq: Every day | ORAL | 1 refills | Status: DC

## 2019-10-07 MED ORDER — AMLODIPINE BESYLATE 10 MG PO TABS: 10.0000 mg | ORAL_TABLET | Freq: Every day | ORAL | 1 refills | Status: DC

## 2019-10-07 MED ORDER — BREO ELLIPTA 100-25 MCG/INH IN AEPB: 1.0000 | INHALATION_SPRAY | Freq: Every morning | RESPIRATORY_TRACT | 5 refills | Status: DC

## 2019-10-07 MED ORDER — HYDROCHLOROTHIAZIDE 25 MG PO TABS: 25.0000 mg | ORAL_TABLET | Freq: Every day | ORAL | 1 refills | Status: DC

## 2019-10-07 MED ORDER — LOSARTAN POTASSIUM 100 MG PO TABS: 100.0000 mg | ORAL_TABLET | Freq: Every day | ORAL | 1 refills | Status: DC

## 2019-10-07 MED ORDER — OMEPRAZOLE 20 MG PO CPDR: 20.0000 mg | DELAYED_RELEASE_CAPSULE | Freq: Every day | ORAL | 3 refills | Status: DC

## 2019-10-07 MED ORDER — ESCITALOPRAM OXALATE 10 MG PO TABS: 10.0000 mg | ORAL_TABLET | Freq: Every day | ORAL | 1 refills | Status: DC

## 2019-10-07 MED ORDER — METOPROLOL SUCCINATE ER 25 MG PO TB24: 25.0000 mg | ORAL_TABLET | Freq: Every day | ORAL | 1 refills | Status: DC

## 2019-10-07 MED ORDER — ALPRAZOLAM 0.5 MG PO TABS: 0.5000 mg | ORAL_TABLET | Freq: Two times a day (BID) | ORAL | 1 refills | Status: DC | PRN

## 2019-10-07 NOTE — Patient Instructions (Signed)
Chronic Obstructive Pulmonary Disease Chronic obstructive pulmonary disease (COPD) is a long-term (chronic) lung problem. When you have COPD, it is hard for air to get in and out of your lungs. Usually the condition gets worse over time, and your lungs will never return to normal. There are things you can do to keep yourself as healthy as possible.  Your doctor may treat your condition with: ? Medicines. ? Oxygen. ? Lung surgery.  Your doctor may also recommend: ? Rehabilitation. This includes steps to make your body work better. It may involve a team of specialists. ? Quitting smoking, if you smoke. ? Exercise and changes to your diet. ? Comfort measures (palliative care). Follow these instructions at home: Medicines  Take over-the-counter and prescription medicines only as told by your doctor.  Talk to your doctor before taking any cough or allergy medicines. You may need to avoid medicines that cause your lungs to be dry. Lifestyle  If you smoke, stop. Smoking makes the problem worse. If you need help quitting, ask your doctor.  Avoid being around things that make your breathing worse. This may include smoke, chemicals, and fumes.  Stay active, but remember to rest as well.  Learn and use tips on how to relax.  Make sure you get enough sleep. Most adults need at least 7 hours of sleep every night.  Eat healthy foods. Eat smaller meals more often. Rest before meals. Controlled breathing Learn and use tips on how to control your breathing as told by your doctor. Try:  Breathing in (inhaling) through your nose for 1 second. Then, pucker your lips and breath out (exhale) through your lips for 2 seconds.  Putting one hand on your belly (abdomen). Breathe in slowly through your nose for 1 second. Your hand on your belly should move out. Pucker your lips and breathe out slowly through your lips. Your hand on your belly should move in as you breathe out.  Controlled coughing Learn  and use controlled coughing to clear mucus from your lungs. Follow these steps: 1. Lean your head a little forward. 2. Breathe in deeply. 3. Try to hold your breath for 3 seconds. 4. Keep your mouth slightly open while coughing 2 times. 5. Spit any mucus out into a tissue. 6. Rest and do the steps again 1 or 2 times as needed. General instructions  Make sure you get all the shots (vaccines) that your doctor recommends. Ask your doctor about a flu shot and a pneumonia shot.  Use oxygen therapy and pulmonary rehabilitation if told by your doctor. If you need home oxygen therapy, ask your doctor if you should buy a tool to measure your oxygen level (oximeter).  Make a COPD action plan with your doctor. This helps you to know what to do if you feel worse than usual.  Manage any other conditions you have as told by your doctor.  Avoid going outside when it is very hot, cold, or humid.  Avoid people who have a sickness you can catch (contagious).  Keep all follow-up visits as told by your doctor. This is important. Contact a doctor if:  You cough up more mucus than usual.  There is a change in the color or thickness of the mucus.  It is harder to breathe than usual.  Your breathing is faster than usual.  You have trouble sleeping.  You need to use your medicines more often than usual.  You have trouble doing your normal activities such as getting dressed   or walking around the house. Get help right away if:  You have shortness of breath while resting.  You have shortness of breath that stops you from: ? Being able to talk. ? Doing normal activities.  Your chest hurts for longer than 5 minutes.  Your skin color is more blue than usual.  Your pulse oximeter shows that you have low oxygen for longer than 5 minutes.  You have a fever.  You feel too tired to breathe normally. Summary  Chronic obstructive pulmonary disease (COPD) is a long-term lung problem.  The way your  lungs work will never return to normal. Usually the condition gets worse over time. There are things you can do to keep yourself as healthy as possible.  Take over-the-counter and prescription medicines only as told by your doctor.  If you smoke, stop. Smoking makes the problem worse. This information is not intended to replace advice given to you by your health care provider. Make sure you discuss any questions you have with your health care provider. Document Revised: 08/15/2017 Document Reviewed: 10/07/2016 Elsevier Patient Education  2020 Elsevier Inc.  
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
## 2019-10-11 ENCOUNTER — Encounter (HOSPITAL_COMMUNITY): Payer: Self-pay | Admitting: *Deleted

## 2019-10-11 NOTE — Progress Notes (Signed)
Patient was referred to our LDCT lung cancer screening program.  I attempted to call patient today and was unable to leave a message on his mobile and his home phone is not in service.

## 2019-10-12 ENCOUNTER — Other Ambulatory Visit (HOSPITAL_COMMUNITY): Payer: Self-pay | Admitting: *Deleted

## 2019-10-12 ENCOUNTER — Encounter (HOSPITAL_COMMUNITY): Payer: Self-pay | Admitting: *Deleted

## 2019-10-12 NOTE — Progress Notes (Signed)
Received referral for initial lung cancer screening scan by [name], FNP.   I contacted patient and obtained smoking history (started smoking at age 64, at that time was more of a fad, He started smoking 1 ppd at age 41, he started smoking 2 ppd around age 50 and then at age 53 dropped back down to 1ppd, he is a current smoker, 51 pack year history) as well as answering questions related to the screening process.  Patient denies signs/symptoms of lung cancer such as weight loss or hemoptysis.  Patient denies comorbidity that would prevent curative treatment if lung cancer were to be found.  Patient is scheduled for shared decision making visit and CT scan on 2/2 at 0950.

## 2019-10-17 DIAGNOSIS — Z7189 Other specified counseling: Secondary | ICD-10-CM | POA: Insufficient documentation

## 2019-10-17 DIAGNOSIS — Z0000 Encounter for general adult medical examination without abnormal findings: Secondary | ICD-10-CM | POA: Insufficient documentation

## 2019-10-17 DIAGNOSIS — Z708 Other sex counseling: Secondary | ICD-10-CM | POA: Insufficient documentation

## 2019-10-17 NOTE — Progress Notes (Signed)
Cardiology Office Note   Date:  10/19/2019   ID:  Eric, Saunders [birth year], MRN [id number]  PCP:  Eric Pretty, FNP  Cardiologist:   Minus Breeding, MD   Chief Complaint  Patient presents with  . Shortness of Breath     History of Present Illness: Eric Saunders is a 64 y.o. male who presents for followup after CABG.  Last year he had some increased dyspnea.  I sent him for a POET (Plain Old Exercise Treadmill) and he had no evidence of ischemia.   He did have a bit of a hypertensive blood pressure response.  However, he had no evidence of ischemia.  Is not really had any chest discomfort.  He has had syncope and actually reports the EMTs were called once because he was eating and felt like the food went down the wrong pipe and he started coughing and he passed out.  He has had some cough syncope before.  He has had a couple of episodes like this before and has sensations of coughing equally particularly when eating or drinking.  He had a chest x-ray which I reviewed which was unremarkable.  CT was done today but the results are not available.  He has not been having any palpitations.  Otherwise he is able to walk inclines and walks with his daughter frequently.  He gets short of breath climbing halfway up the hill he can keep going.  This does not bring on coughing or chest discomfort or other symptoms.  He thinks he gets appropriately short of breath with his activity we can keep going when he gets to the top.  He is not having any PND or orthopnea.  He is not having any leg swelling or weight gain    Past Medical History:  Diagnosis Date  . Coronary artery disease   . Hyperlipemia   . Hypertension   . Tobacco abuse     Past Surgical History:  Procedure Laterality Date  . CORONARY ARTERY BYPASS GRAFT N/A 06/17/2014   Procedure: CORONARY ARTERY BYPASS GRAFTING (CABG) X 4, RIGHT LEG EVH;  Surgeon: Gaye Pollack, MD;  Location: [place] OR;  Service: Open Heart Surgery;   Laterality: N/A;  . KNEE SURGERY     bilateral arthroscopic  . LEFT HEART CATHETERIZATION WITH CORONARY ANGIOGRAM N/A 06/16/2014   Procedure: LEFT HEART CATHETERIZATION WITH CORONARY ANGIOGRAM;  Surgeon: Burnell Blanks, MD;  Location[facility] CATH LAB;  Service: Cardiovascular;  Laterality: N/A;  . TEE WITHOUT CARDIOVERSION N/A 06/17/2014   Procedure: TRANSESOPHAGEAL ECHOCARDIOGRAM (TEE);  Surgeon: Gaye Pollack, MD;  Location: [place];  Service: Open Heart Surgery;  Laterality: N/A;     Current Outpatient Medications  Medication Sig Dispense Refill  . ALPRAZolam (XANAX) 0.5 MG tablet Take 1 tablet (0.5 mg total) by mouth 2 (two) times daily as needed for anxiety. 60 tablet 1  . amLODipine (NORVASC) 10 MG tablet Take 1 tablet (10 mg total) by mouth daily. 90 tablet 1  . aspirin 81 MG tablet Take 81 mg by mouth daily.    . benzonatate (TESSALON PERLES) 100 MG capsule Take 1 capsule (100 mg total) by mouth 3 (three) times daily as needed for cough. 20 capsule 0  . cyclobenzaprine (FLEXERIL) 10 MG tablet Take 1 tablet (10 mg total) by mouth 3 (three) times daily as needed for muscle spasms. 30 tablet 1  . escitalopram (LEXAPRO) 10 MG tablet Take 1 tablet (10 mg total) by mouth daily.  90 tablet 1  . fenofibrate 160 MG tablet Take 1 tablet (160 mg total) by mouth daily. 90 tablet 1  . fluticasone furoate-vilanterol (BREO ELLIPTA) 100-25 MCG/INH AEPB Inhale 1 puff into the lungs every morning. 1 each 5  . hydrochlorothiazide (HYDRODIURIL) 25 MG tablet Take 1 tablet (25 mg total) by mouth daily. 90 tablet 1  . ibuprofen (IBU) 800 MG tablet TAKE ONE TABLET BY MOUTH 3 TIMES DAILY AS NEEDED 90 tablet 0  . losartan (COZAAR) 100 MG tablet Take 1 tablet (100 mg total) by mouth daily. 90 tablet 1  . metoprolol succinate (TOPROL-XL) 25 MG 24 hr tablet Take 1 tablet (25 mg total) by mouth daily. 90 tablet 1  . omeprazole (PRILOSEC) 20 MG capsule Take 1 capsule (20 mg total) by mouth daily. 30 capsule 3  .  pravastatin (PRAVACHOL) 80 MG tablet Take 1 tablet (80 mg total) by mouth daily. 90 tablet 3  . sildenafil (VIAGRA) 100 MG tablet Take 0.5-1 tablets (50-100 mg total) by mouth daily as needed for erectile dysfunction. 5 tablet 11  . varenicline (CHANTIX CONTINUING MONTH PAK) 1 MG tablet Take 1 tablet (1 mg total) by mouth 2 (two) times daily. 60 tablet 3  . varenicline (CHANTIX PAK) 0.5 MG X 11 & 1 MG X 42 tablet Take one 0.5 mg tablet by mouth once daily X 3 days, then one 0.5 mg tablet twice daily X 4 days, then one 1 mg tablet twice daily. 53 tablet 0   No current facility-administered medications for this visit.    Allergies:   Ace inhibitors and Other    ROS:  Please see the history of present illness.   Otherwise, review of systems are positive for none.   All other systems are reviewed and negative.    PHYSICAL EXAM: VS:  BP 132/82   Pulse (!) 54   Ht 5\' 8"  (1.727 m)   Wt 205 lb (93 kg)   BMI 31.17 kg/m  , BMI Body mass index is 31.17 kg/m. GENERAL:  Well appearing NECK:  No jugular venous distention, waveform within normal limits, carotid upstroke brisk and symmetric, no bruits, no thyromegaly LUNGS:  Clear to auscultation bilaterally CHEST:  Well healed sternotomy scar. HEART:  PMI not displaced or sustained,S1 and S2 within normal limits, no S3, no S4, no clicks, no rubs, no murmurs ABD:  Flat, positive bowel sounds normal in frequency in pitch, no bruits, no rebound, no guarding, no midline pulsatile mass, no hepatomegaly, no splenomegaly EXT:  2 plus pulses throughout, no edema, no cyanosis no clubbing   EKG:  EKG is ordered today. The ekg ordered today demonstrates sinus rhythm, rate 54, axis within normal limits,   Recent Labs: 04/06/2019: ALT 20; BUN 23; Creatinine, Ser 1.13; Potassium 4.7; Sodium 143    Lipid Panel    Component Value Date/Time   CHOL 205 (H) 04/06/2019 0908   CHOL 166 12/28/2012 0932   TRIG 173 (H) 04/06/2019 0908   TRIG 157 (H) 11/23/2014  1549   TRIG 129 12/28/2012 0932   HDL 43 04/06/2019 0908   HDL 44 11/23/2014 1549   HDL 42 12/28/2012 0932   CHOLHDL 4.8 04/06/2019 0908   LDLCALC 127 (H) 04/06/2019 0908   LDLCALC 90 07/06/2014 0000   LDLCALC 98 12/28/2012 0932      Wt Readings from Last 3 Encounters:  10/19/19 205 lb (93 kg)  10/07/19 207 lb (93.9 kg)  08/23/19 193 lb 12.8 oz (87.9 kg)  Other studies Reviewed: Additional studies/ records that were reviewed today include: CXR. Review of the above records demonstrates:  Please see elsewhere in the note.     ASSESSMENT AND PLAN:  CAD/CABG:     The patient had no evidence of ischemia on his treadmill and he had a good exercise tolerance.  He did have some hypertensive blood pressure response but he is on good medical therapy with well-controlled blood pressures otherwise.  No change in therapy.    TOBACCO:   He quit smoking for a while after I saw him he was restarted.  He still has a prescription for Chantix.  DYSLIPIDEMIA:   LDL was actually 127 in the summer.  He does not want to take a lot of medicines but I will increase him to Pravachol 80 which he has tolerated.  We can repeat a lipid profile later this year.  COUGH: He has cough and I think resulted in syncope.  This almost definitely sounds like it is related to his swallowing problem.  I sent a message to his primary provider today to ask about considering a swallowing study versus a GI study.  He has the CT outstanding.  I will defer to Eric Pretty, FNP  HTN:  The blood pressure is at target.  No change in therapy.    COVID EDUCATION: We talked briefly about the vaccine.   Current medicines are reviewed at length with the patient today.  The patient does not have concerns regarding medicines.  The following changes have been made:  no change  Labs/ tests ordered today include: None  Orders Placed This Encounter  Procedures  . EKG 12-Lead     Disposition:   FU with me in one  year.     Signed, Minus Breeding, MD  10/19/2019 3:19 PM    [facility]

## 2019-10-19 ENCOUNTER — Telehealth: Payer: Self-pay

## 2019-10-19 ENCOUNTER — Ambulatory Visit: Payer: BC Managed Care – PPO | Admitting: Cardiology

## 2019-10-19 ENCOUNTER — Encounter (HOSPITAL_COMMUNITY): Payer: Self-pay | Admitting: *Deleted

## 2019-10-19 ENCOUNTER — Inpatient Hospital Stay (HOSPITAL_COMMUNITY): Payer: BC Managed Care – PPO | Attending: Nurse Practitioner | Admitting: Nurse Practitioner

## 2019-10-19 ENCOUNTER — Ambulatory Visit (HOSPITAL_COMMUNITY)
Admission: RE | Admit: 2019-10-19 | Discharge: 2019-10-19 | Disposition: A | Discharge status: Home / Self Care | Payer: BC Managed Care – PPO | Source: Ambulatory Visit | Attending: Nurse Practitioner | Admitting: Nurse Practitioner

## 2019-10-19 ENCOUNTER — Encounter: Payer: Self-pay | Admitting: Cardiology

## 2019-10-19 ENCOUNTER — Other Ambulatory Visit: Payer: Self-pay

## 2019-10-19 VITALS — BP 132/82 | HR 54 | Ht 68.0 in | Wt 205.0 lb

## 2019-10-19 DIAGNOSIS — Z122 Encounter for screening for malignant neoplasm of respiratory organs: Secondary | ICD-10-CM

## 2019-10-19 DIAGNOSIS — Z720 Tobacco use: Secondary | ICD-10-CM

## 2019-10-19 DIAGNOSIS — I10 Essential (primary) hypertension: Secondary | ICD-10-CM | POA: Diagnosis not present

## 2019-10-19 DIAGNOSIS — I2510 Atherosclerotic heart disease of native coronary artery without angina pectoris: Secondary | ICD-10-CM

## 2019-10-19 DIAGNOSIS — E785 Hyperlipidemia, unspecified: Secondary | ICD-10-CM

## 2019-10-19 DIAGNOSIS — Z7189 Other specified counseling: Secondary | ICD-10-CM

## 2019-10-19 DIAGNOSIS — F17210 Nicotine dependence, cigarettes, uncomplicated: Secondary | ICD-10-CM | POA: Diagnosis not present

## 2019-10-19 MED ORDER — PRAVASTATIN SODIUM 80 MG PO TABS: 80.0000 mg | ORAL_TABLET | Freq: Every day | ORAL | 3 refills | Status: DC

## 2019-10-19 NOTE — Progress Notes (Signed)
[facility] NOTE  Patient Care Team: [name], FNP as PCP - General (Nurse Practitioner) Minus Breeding, MD as PCP - Cardiology (Cardiology) Minus Breeding, MD as Consulting Physician (Cardiology)  CHIEF COMPLAINTS/PURPOSE OF CONSULTATION: Shared decision making visit for lung cancer screening  HISTORY OF PRESENTING ILLNESS:  Eric Saunders 64 y.o. male presents today for a shared decision-making visit for lung cancer screening.  He has a past medical history significant for CAD, CABG, hypertension, and tobacco abuse.  Patient states he started smoking at the age of 73.  He has smoked anywhere from a half a pack to a pack and a half a day.  He currently smokes about a pack a day.  Total years smoked 49 years.  He reports that he has coughing spells so intense that he passes out and loses consciousness for 5 seconds.  Patient reports he has a productive cough that is clear sputum.  He denies any hemoptysis.  He does report shortness of breath on exertion.  It resolves at rest.  He denies any alcohol or illicit drug use.  MEDICAL HISTORY:  Past Medical History:  Diagnosis Date  . Coronary artery disease   . Hyperlipemia   . Hypertension   . Tobacco abuse     SURGICAL HISTORY: Past Surgical History:  Procedure Laterality Date  . CORONARY ARTERY BYPASS GRAFT N/A 06/17/2014   Procedure: CORONARY ARTERY BYPASS GRAFTING (CABG) X 4, RIGHT LEG EVH;  Surgeon: Gaye Pollack, MD;  Location: [place] OR;  Service: Open Heart Surgery;  Laterality: N/A;  . KNEE SURGERY     bilateral arthroscopic  . LEFT HEART CATHETERIZATION WITH CORONARY ANGIOGRAM N/A 06/16/2014   Procedure: LEFT HEART CATHETERIZATION WITH CORONARY ANGIOGRAM;  Surgeon: Burnell Blanks, MD;  Location: [facility] CATH LAB;  Service: Cardiovascular;  Laterality: N/A;  . TEE WITHOUT CARDIOVERSION N/A 06/17/2014   Procedure: TRANSESOPHAGEAL ECHOCARDIOGRAM (TEE);  Surgeon: Gaye Pollack, MD;  Location: [place];  Service: Open Heart Surgery;  Laterality: N/A;    SOCIAL HISTORY: Social History   Socioeconomic History  . Marital status: Married    Spouse name: Not on file  . Number of children: Not on file  . Years of education: Not on file  . Highest education level: Not on file  Occupational History  . Not on file  Tobacco Use  . Smoking status: Current Every Day Smoker    Packs/day: 1.00    Years: 30.00    Pack years: 30.00    Types: Cigarettes    Start date: 12/28/2015  . Smokeless tobacco: Never Used  Substance and Sexual Activity  . Alcohol use: Yes    Alcohol/week: 0.0 standard drinks    Comment: occasionally  . Drug use: No  . Sexual activity: Not on file  Other Topics Concern  . Not on file  Social History Narrative  . Not on file   Social Determinants of Health   Financial Resource Strain:   . Difficulty of Paying Living Expenses: Not on file  Food Insecurity:   . Worried About Charity fundraiser in the Last Year: Not on file  . Ran Out of Food in the Last Year: Not on file  Transportation Needs:   . Lack of Transportation (Medical): Not on file  . Lack of Transportation (Non-Medical): Not on file  Physical Activity:   . Days of Exercise per Week: Not on file  . Minutes of Exercise per Session: Not on file  Stress:   . Feeling of Stress : Not on file  Social Connections:   . Frequency of Communication with Friends and Family: Not on file  . Frequency of Social Gatherings with Friends and Family: Not on file  . Attends Religious Services: Not on file  . Active Member of Clubs or Organizations: Not on file  . Attends Archivist Meetings: Not on file  . Marital Status: Not on file  Intimate Partner Violence:   . Fear of Current or Ex-Partner: Not on file  . Emotionally Abused: Not on file  . Physically Abused: Not on file  . Sexually Abused: Not on file    FAMILY HISTORY: Family History  Problem Relation Age of Onset  . Drug abuse Sister    . Healthy Brother   . Diabetes Sister   . Healthy Sister   . Healthy Sister   . Healthy Sister   . Healthy Sister   . Healthy Brother     ALLERGIES:  is allergic to ace inhibitors and other.  MEDICATIONS:  Current Outpatient Medications  Medication Sig Dispense Refill  . ALPRAZolam (XANAX) 0.5 MG tablet Take 1 tablet (0.5 mg total) by mouth 2 (two) times daily as needed for anxiety. 60 tablet 1  . amLODipine (NORVASC) 10 MG tablet Take 1 tablet (10 mg total) by mouth daily. 90 tablet 1  . aspirin 81 MG tablet Take 81 mg by mouth daily.    . benzonatate (TESSALON PERLES) 100 MG capsule Take 1 capsule (100 mg total) by mouth 3 (three) times daily as needed for cough. 20 capsule 0  . cyclobenzaprine (FLEXERIL) 10 MG tablet Take 1 tablet (10 mg total) by mouth 3 (three) times daily as needed for muscle spasms. 30 tablet 1  . escitalopram (LEXAPRO) 10 MG tablet Take 1 tablet (10 mg total) by mouth daily. 90 tablet 1  . fenofibrate 160 MG tablet Take 1 tablet (160 mg total) by mouth daily. 90 tablet 1  . fluticasone furoate-vilanterol (BREO ELLIPTA) 100-25 MCG/INH AEPB Inhale 1 puff into the lungs every morning. 1 each 5  . hydrochlorothiazide (HYDRODIURIL) 25 MG tablet Take 1 tablet (25 mg total) by mouth daily. 90 tablet 1  . ibuprofen (IBU) 800 MG tablet TAKE ONE TABLET BY MOUTH 3 TIMES DAILY AS NEEDED 90 tablet 0  . losartan (COZAAR) 100 MG tablet Take 1 tablet (100 mg total) by mouth daily. 90 tablet 1  . metoprolol succinate (TOPROL-XL) 25 MG 24 hr tablet Take 1 tablet (25 mg total) by mouth daily. 90 tablet 1  . omeprazole (PRILOSEC) 20 MG capsule Take 1 capsule (20 mg total) by mouth daily. 30 capsule 3  . pravastatin (PRAVACHOL) 40 MG tablet Take 1 tablet (40 mg total) by mouth daily. 90 tablet 1  . sildenafil (VIAGRA) 100 MG tablet Take 0.5-1 tablets (50-100 mg total) by mouth daily as needed for erectile dysfunction. 5 tablet 11  . varenicline (CHANTIX CONTINUING MONTH PAK) 1 MG  tablet Take 1 tablet (1 mg total) by mouth 2 (two) times daily. (Patient not taking: Reported on 10/07/2019) 60 tablet 3  . varenicline (CHANTIX PAK) 0.5 MG X 11 & 1 MG X 42 tablet Take one 0.5 mg tablet by mouth once daily X 3 days, then one 0.5 mg tablet twice daily X 4 days, then one 1 mg tablet twice daily. (Patient not taking: Reported on 10/07/2019) 53 tablet 0   No current facility-administered medications for this visit.    REVIEW  OF SYSTEMS:   Constitutional: Denies fevers, chills or abnormal night sweats Respiratory: + Cough,+ shortness of breath with exertion Cardiovascular: Denies palpitation, chest discomfort or lower extremity swelling Gastrointestinal:  Denies nausea, heartburn or change in bowel habits Skin: Denies abnormal skin rashes Lymphatics: Denies new lymphadenopathy or easy bruising Neurological:Denies numbness, tingling or new weaknesses Behavioral/Psych: Mood is stable, no new changes  All other systems were reviewed with the patient and are negative.  PHYSICAL EXAMINATION: ECOG PERFORMANCE STATUS: 1 - Symptomatic but completely ambulatory  No vital signs for this visit  GENERAL:alert, no distress and comfortable SKIN: skin color, texture, turgor are normal, no rashes or significant lesions NECK: supple, thyroid normal size, non-tender, without nodularity LYMPH:  no palpable lymphadenopathy in the cervical, axillary or inguinal LUNGS: clear to auscultation and percussion with normal breathing effort HEART: regular rate & rhythm and no murmurs and no lower extremity edema ABDOMEN:abdomen soft, non-tender and normal bowel sounds Musculoskeletal:no cyanosis of digits and no clubbing  PSYCH: alert & oriented x 3 with fluent speech NEURO: no focal motor/sensory deficits  LABORATORY DATA:  I have reviewed the data as listed Lab Results  Component Value Date   WBC 10.0 05/26/2017   HGB 14.7 05/26/2017   HCT 43.8 05/26/2017   MCV 87.6 05/26/2017   PLT 298  05/26/2017     Chemistry      Component Value Date/Time   NA 143 04/06/2019 0908   K 4.7 04/06/2019 0908   CL 103 04/06/2019 0908   CO2 20 04/06/2019 0908   BUN 23 04/06/2019 0908   CREATININE 1.13 04/06/2019 0908   CREATININE 1.04 08/10/2014 0913      Component Value Date/Time   CALCIUM 9.9 04/06/2019 0908   ALKPHOS 54 04/06/2019 0908   AST 16 04/06/2019 0908   ALT 20 04/06/2019 0908   BILITOT 0.2 04/06/2019 0908       RADIOGRAPHIC STUDIES: I have personally reviewed the radiological images as listed and agreed with the findings in the report. DG Chest 2 View  Result Date: 10/07/2019 CLINICAL DATA:  Tobacco use EXAM: CHEST - 2 VIEW COMPARISON:  Feb 13, 2018 FINDINGS: Lungs are clear. Heart size and pulmonary vascularity are normal. No adenopathy. Patient is status post coronary artery bypass grafting. There is mild degenerative change in the thoracic spine. IMPRESSION: Lungs clear. Heart size normal. No adenopathy. Status post coronary artery bypass grafting. Electronically Signed   By: Lowella Grip III M.D.   On: 10/07/2019 08:31    ASSESSMENT & PLAN:  Encounter for screening for lung cancer 1.  Tobacco abuse: -This patient meets criteria for low-dose CT lung cancer screening.  He is asymptomatic for any signs or symptoms of lung cancer. -The shared decision making visit discussion includes risk and benefits of screening, potential for follow-up, diagnostic testing for abnormal scans, potential for false positive test, over diagnosis, and discussion about total radiation exposure. -Patient stated willingness to undergo diagnostic and treatment as needed. -Patient was counseled on smoking cessation to decrease risk of lung cancer, pulmonary disease, heart disease and stroke. -Patient was given a resource card with information on receiving free nicotine replacement therapy, and information about free smoking cessation classes. -Patient will present for LDCT scan today and  follow-up with PCP.  No orders of the defined types were placed in this encounter.   All questions were answered. The patient knows to call the clinic with any problems, questions or concerns. I spent 22 minutes counseling the patient face to  face. The total time spent in the appointment was 28 minutes and more than 50% was on counseling.     Glennie Isle, NP-C 10/19/2019 10:39 AM

## 2019-10-19 NOTE — Assessment & Plan Note (Signed)
1.  Tobacco abuse: -This patient meets criteria for low-dose CT lung cancer screening.  He is asymptomatic for any signs or symptoms of lung cancer. -The shared decision making visit discussion includes risk and benefits of screening, potential for follow-up, diagnostic testing for abnormal scans, potential for false positive test, over diagnosis, and discussion about total radiation exposure. -Patient stated willingness to undergo diagnostic and treatment as needed. -Patient was counseled on smoking cessation to decrease risk of lung cancer, pulmonary disease, heart disease and stroke. -Patient was given a resource card with information on receiving free nicotine replacement therapy, and information about free smoking cessation classes. -Patient will present for LDCT scan today and follow-up with PCP.

## 2019-10-19 NOTE — Telephone Encounter (Signed)
-----   Message from [name], [place] sent at 10/19/2019  2:58 PM EST ----- Dr. Warren Lacy wants him to have GI workup since his black outs occur when coughing- will do gi referral. Has he seen GI in the past? ----- Message ----- From: Minus Breeding, MD Sent: 10/19/2019   2:22 PM EST To: [name], FNP  Do you think he needs a swallow study or GI appt .  He has syncope secondary to cough and cough sounds like GI in origin. ?  Thanks.  Eric Saunders

## 2019-10-19 NOTE — Telephone Encounter (Signed)
Spoke with patient regarding referral. He wants to wait until his recent test results come back and then go from there before he sees a GI. Advised patient we would hold off on referral. Patient verbalized understanding

## 2019-10-19 NOTE — Patient Instructions (Signed)
You were seen today for your shared decision making visit and a low-dose CT scan for lung cancer screening.    

## 2019-10-19 NOTE — Patient Instructions (Addendum)
Medication Instructions:  Increase pravastatin to 80mg  daily *If you need a refill on your cardiac medications before your next appointment, please call your pharmacy*  Lab Work: None  Testing/Procedures: None  Follow-Up: At [facility], you and your health needs are our priority.  As part of our continuing mission to provide you with exceptional heart care, we have created designated Provider Care Teams.  These Care Teams include your primary Cardiologist (physician) and Advanced Practice Providers (APPs -  Physician Assistants and Nurse Practitioners) who all work together to provide you with the care you need, when you need it.  Your next appointment:   1 year(s) You will receive a reminder letter in the mail two months in advance. If you don't receive a letter, please call our office to schedule the follow-up appointment.   The format for your next appointment:   In Person  Provider:   Minus Breeding, MD

## 2019-10-20 ENCOUNTER — Encounter (HOSPITAL_COMMUNITY): Payer: Self-pay | Admitting: *Deleted

## 2019-10-20 NOTE — Progress Notes (Signed)
Patient notified via mail of LDCT lung cancer screening results with recommendations to follow up in 12 months.  Also notified of incidental findings and need to follow up with PCP.  Patient's referring provider was sent a copy of results.    IMPRESSION: Lung-RADS Category 1, negative. Continue annual screening with low-dose chest CT without contrast in 12 months.  Aortic Atherosclerosis (ICD10-I70.0) and Emphysema (ICD10-J43.9).

## 2019-11-25 DIAGNOSIS — M7542 Impingement syndrome of left shoulder: Secondary | ICD-10-CM | POA: Diagnosis not present

## 2019-11-30 ENCOUNTER — Other Ambulatory Visit: Payer: Self-pay | Admitting: Nurse Practitioner

## 2019-11-30 ENCOUNTER — Telehealth: Payer: Self-pay | Admitting: Nurse Practitioner

## 2019-11-30 DIAGNOSIS — R05 Cough: Secondary | ICD-10-CM

## 2019-11-30 DIAGNOSIS — R053 Chronic cough: Secondary | ICD-10-CM

## 2019-11-30 NOTE — Telephone Encounter (Signed)
  REFERRAL REQUEST Telephone Note 11/30/2019  What type of referral do you need? Needs a referral to GI for Endoscopy  Has a coughing spells and passes out. Feels weak today. 4-5 spells since December  Have you been seen at our office for this problem? YES (Advise that they may need an appointment with their PCP before a referral can be done)  Is there a particular doctor or location that you prefer? [place]   Patient notified that referrals can take up to a week or longer to process. If they haven't heard anything within a week they should call back and speak with the referral department.

## 2019-11-30 NOTE — Telephone Encounter (Signed)
Aware of referral in process. 

## 2019-11-30 NOTE — Telephone Encounter (Signed)
Mmm, what do you think about this - This was noted in the OV with you in JAN 2021.

## 2019-11-30 NOTE — Telephone Encounter (Signed)
Strated on BREO to see is wuld help with cough because he is a smoker. I will go ahead and do GI referral

## 2019-12-01 ENCOUNTER — Encounter: Payer: Self-pay | Admitting: Physician Assistant

## 2019-12-09 DIAGNOSIS — M5136 Other intervertebral disc degeneration, lumbar region: Secondary | ICD-10-CM | POA: Diagnosis not present

## 2019-12-09 DIAGNOSIS — M47816 Spondylosis without myelopathy or radiculopathy, lumbar region: Secondary | ICD-10-CM | POA: Diagnosis not present

## 2019-12-10 ENCOUNTER — Encounter: Payer: Self-pay | Admitting: Physician Assistant

## 2019-12-10 ENCOUNTER — Ambulatory Visit: Payer: BC Managed Care – PPO | Admitting: Physician Assistant

## 2019-12-10 VITALS — BP 170/100 | HR 70 | Temp 98.2°F | Ht 68.0 in | Wt 203.0 lb

## 2019-12-10 DIAGNOSIS — K219 Gastro-esophageal reflux disease without esophagitis: Secondary | ICD-10-CM

## 2019-12-10 DIAGNOSIS — R198 Other specified symptoms and signs involving the digestive system and abdomen: Secondary | ICD-10-CM

## 2019-12-10 DIAGNOSIS — R0989 Other specified symptoms and signs involving the circulatory and respiratory systems: Secondary | ICD-10-CM | POA: Diagnosis not present

## 2019-12-10 DIAGNOSIS — R054 Cough syncope: Secondary | ICD-10-CM | POA: Insufficient documentation

## 2019-12-10 DIAGNOSIS — R55 Syncope and collapse: Secondary | ICD-10-CM | POA: Insufficient documentation

## 2019-12-10 DIAGNOSIS — R05 Cough: Secondary | ICD-10-CM

## 2019-12-10 MED ORDER — OMEPRAZOLE 20 MG PO CPDR: 20.0000 mg | DELAYED_RELEASE_CAPSULE | Freq: Two times a day (BID) | ORAL | 3 refills | Status: DC

## 2019-12-10 NOTE — Progress Notes (Signed)
Subjective:    Patient ID: Eric Saunders, male    DOB: 14-May-1956, 64 y.o.   MRN: [id number]  HPI Eric Saunders is a pleasant 64 year old white male, new to GI today referred by [facility] family [facility]/Mary-Margaret Hassell Done FNP, for evaluation of recurrent episodes of coughing spasms followed by syncope. Patient has not had any prior GI evaluation.  He does have history of coronary artery disease and is status post CABG , also has history of hypertension.  He is a smoker. Patient says that he had initial episode in 2015, and has had a couple of episodes since then.  However since December 2020 he has had 3 episodes.  The last episode occurred about 2 weeks ago on a Sunday while he was eating.  He does not recall having a sensation of dysphagia or food lodging.  He just starts having hard coughing and then passes out.  These episodes have all been witnessed by his family, apparently happen very quickly and he is only out for 15 or 20 seconds.  He says he feels drained after these episodes.  He is not aware of any warning symptoms.  The episode before that occurred while he was sitting down in a chair on his laptop.  He was not eating, and does not remember having a sensation of choking on his saliva or strangling sensation.  He says it is almost like his throat wants to close up and he is immediately coughing/choking hard then again syncopized this. He generally does not have a sensation of dysphagia or odynophagia with eating though at times he does have a sensation that "liquid wants to come back up".  He generally does not have indigestion but does have occasional heartburn.  He has recently been on Prilosec 20 mg p.o. daily which seems to help but has not altered the coughing episodes or syncope. He denies any abdominal pain, changes in bowel habits melena or hematochezia.  No prior colonoscopy and has been reluctant to have colonoscopy done. In between these acute episodes he does occasionally have some  coughing from smoking but says he is not having any regular ongoing coughing during the daytime or at night. He had CT of the chest done on 10/19/2019 which showed mild changes of COPD, otherwise negative. Plain films of the neck/soft tissue were done in December 2020 showing a normal airway, cervical spondylosis with mild to moderate anterior osteophytes.  Review of Systems Pertinent positive and negative review of systems were noted in the above HPI section.  All other review of systems was otherwise negative.  Outpatient Encounter Medications as of 12/10/2019  Medication Sig  . ALPRAZolam (XANAX) 0.5 MG tablet Take 1 tablet (0.5 mg total) by mouth 2 (two) times daily as needed for anxiety.  Marland Kitchen amLODipine (NORVASC) 10 MG tablet Take 1 tablet (10 mg total) by mouth daily.  Marland Kitchen aspirin 81 MG tablet Take 81 mg by mouth daily.  . benzonatate (TESSALON PERLES) 100 MG capsule Take 1 capsule (100 mg total) by mouth 3 (three) times daily as needed for cough.  . cyclobenzaprine (FLEXERIL) 10 MG tablet Take 1 tablet (10 mg total) by mouth 3 (three) times daily as needed for muscle spasms.  Marland Kitchen escitalopram (LEXAPRO) 10 MG tablet Take 1 tablet (10 mg total) by mouth daily.  . fenofibrate 160 MG tablet Take 1 tablet (160 mg total) by mouth daily.  . fluticasone furoate-vilanterol (BREO ELLIPTA) 100-25 MCG/INH AEPB Inhale 1 puff into the lungs every morning.  Marland Kitchen  hydrochlorothiazide (HYDRODIURIL) 25 MG tablet Take 1 tablet (25 mg total) by mouth daily.  Marland Kitchen ibuprofen (IBU) 800 MG tablet TAKE ONE TABLET BY MOUTH 3 TIMES DAILY AS NEEDED  . losartan (COZAAR) 100 MG tablet Take 1 tablet (100 mg total) by mouth daily.  . metoprolol succinate (TOPROL-XL) 25 MG 24 hr tablet Take 1 tablet (25 mg total) by mouth daily.  Marland Kitchen omeprazole (PRILOSEC) 20 MG capsule Take 1 capsule (20 mg total) by mouth in the morning and at bedtime.  . pravastatin (PRAVACHOL) 80 MG tablet Take 1 tablet (80 mg total) by mouth daily.  . sildenafil  (VIAGRA) 100 MG tablet Take 0.5-1 tablets (50-100 mg total) by mouth daily as needed for erectile dysfunction.  . [DISCONTINUED] omeprazole (PRILOSEC) 20 MG capsule Take 1 capsule (20 mg total) by mouth daily.  . [DISCONTINUED] varenicline (CHANTIX CONTINUING MONTH PAK) 1 MG tablet Take 1 tablet (1 mg total) by mouth 2 (two) times daily.  . [DISCONTINUED] varenicline (CHANTIX PAK) 0.5 MG X 11 & 1 MG X 42 tablet Take one 0.5 mg tablet by mouth once daily X 3 days, then one 0.5 mg tablet twice daily X 4 days, then one 1 mg tablet twice daily.   No facility-administered encounter medications on file as of 12/10/2019.   Allergies  Allergen Reactions  . Ace Inhibitors Cough  . Other    Patient Active Problem List   Diagnosis Date Noted  . Cough syncope 12/10/2019  . Encounter for screening for lung cancer 10/19/2019  . Educated about COVID-19 virus infection 10/17/2019  . BMI 29.0-29.9,adult 04/06/2019  . Essential hypertension 04/06/2019  . Tobacco abuse 08/16/2014  . Acute gouty arthritis 06/27/2014  . Constipation 06/26/2014  . CAD (coronary artery disease) 06/17/2014  . Abnormal EKG 01/25/2014  . Hyperlipidemia with target LDL less than 100 12/28/2012  . GAD (generalized anxiety disorder) 12/28/2012   Social History   Socioeconomic History  . Marital status: Married    Spouse name: Not on file  . Number of children: 2  . Years of education: Not on file  . Highest education level: Not on file  Occupational History  . Occupation: Dealer  Tobacco Use  . Smoking status: Current Every Day Smoker    Packs/day: 1.00    Years: 30.00    Pack years: 30.00    Types: Cigarettes    Start date: 12/28/2015  . Smokeless tobacco: Never Used  Substance and Sexual Activity  . Alcohol use: Yes    Alcohol/week: 0.0 standard drinks    Comment: occasionally  . Drug use: No  . Sexual activity: Not on file  Other Topics Concern  . Not on file  Social History Narrative  . Not on file    Social Determinants of Health   Financial Resource Strain:   . Difficulty of Paying Living Expenses:   Food Insecurity:   . Worried About Charity fundraiser in the Last Year:   . Arboriculturist in the Last Year:   Transportation Needs:   . Film/video editor (Medical):   Marland Kitchen Lack of Transportation (Non-Medical):   Physical Activity:   . Days of Exercise per Week:   . Minutes of Exercise per Session:   Stress:   . Feeling of Stress :   Social Connections:   . Frequency of Communication with Friends and Family:   . Frequency of Social Gatherings with Friends and Family:   . Attends Religious Services:   . Active  Member of Clubs or Organizations:   . Attends Archivist Meetings:   Marland Kitchen Marital Status:   Intimate Partner Violence:   . Fear of Current or Ex-Partner:   . Emotionally Abused:   Marland Kitchen Physically Abused:   . Sexually Abused:     Eric Saunders family history includes Diabetes in his sister; Drug abuse in his sister; Healthy in his brother, brother, sister, sister, sister, and sister.      Objective:    Vitals:   12/10/19 0828 12/10/19 0927  BP: (!) 170/100   Pulse: (!) 148 70  Temp: 98.2 F (36.8 C)     Physical Exam Well-developed well-nourished older Wmale in no acute distress.  Height, Weight,203 BMI 30.8  HEENT; nontraumatic normocephalic, EOMI, PE RR LA, sclera anicteric. Oropharynx; not examined Neck; supple, no JVD, nontender, no palpable mass or adenopathy Cardiovascular; regular rate and rhythm with S1-S2, no murmur rub or gallop, renal incisional scars Pulmonary; Clear bilaterally Abdomen; soft, obese, nontender, nondistended, no palpable mass or hepatosplenomegaly, bowel sounds are active Rectal; not done today Skin; benign exam, no jaundice rash or appreciable lesions Extremities; no clubbing cyanosis or edema skin warm and dry Neuro/Psych; alert and oriented x4, grossly nonfocal mood and affect appropriate       Assessment &  Plan:   #72 64 year old white male with coronary artery disease status post CABG x4, with rare episodes of coughing spasms followed by syncope.  Initial episode 2015.  He had 1 or 2 episodes over the next couple of years.  Now since December 2020 he has had 3 episodes. Some of these episodes have been associated with eating, others not. No ongoing dysphagia or odynophagia, he may have some reflux symptoms, with sensation of sour brash.  Etiology of coughing episodes is not clear, rule out aspiration, rule out underlying esophageal dysmotility, doubt stricture but cannot rule out, consider laryngospasm or other laryngeal abnormality.  #2 hypertension #3 smoker #4 colon cancer screening-no prior colonoscopy.  He says he may be amenable to a colonoscopy once his above issues are sorted out.  Plan; Patient will be scheduled for a barium swallow with tablet.  He may require EGD based on findings.  We also discussed possible speech pathology swallowing eval.  Continue Prilosec 20 mg but increase to twice daily dosing. Encourage patient to eat slowly, chew carefully and cut food into small bites. Patient will be established with Dr. Rush Landmark.  Eric Saunders Genia Harold PA-C 12/10/2019   Cc: Hassell Done, Mary-Margaret, *

## 2019-12-10 NOTE — Patient Instructions (Signed)
We have sent the following medications to your pharmacy for you to pick up at your convenience:  Prilosec  You have been scheduled for a Barium Esophogram at [facility] (1st floor of the hospital) on 12/15/2019 at 9:30am. Please arrive 15 minutes prior to your appointment for registration. Make certain not to have anything to eat or drink 3 hours prior to your test. If you need to reschedule for any reason, please contact radiology at [phone] to do so. __________________________________________________________________ A barium swallow is an examination that concentrates on views of the esophagus. This tends to be a double contrast exam (barium and two liquids which, when combined, create a gas to distend the wall of the oesophagus) or single contrast (non-ionic iodine based). The study is usually tailored to your symptoms so a good history is essential. Attention is paid during the study to the form, structure and configuration of the esophagus, looking for functional disorders (such as aspiration, dysphagia, achalasia, motility and reflux) EXAMINATION You may be asked to change into a gown, depending on the type of swallow being performed. A radiologist and radiographer will perform the procedure. The radiologist will advise you of the type of contrast selected for your procedure and direct you during the exam. You will be asked to stand, sit or lie in several different positions and to hold a small amount of fluid in your mouth before being asked to swallow while the imaging is performed .In some instances you may be asked to swallow barium coated marshmallows to assess the motility of a solid food bolus. The exam can be recorded as a digital or video fluoroscopy procedure. POST PROCEDURE It will take 1-2 days for the barium to pass through your system. To facilitate this, it is important, unless otherwise directed, to increase your fluids for the next 24-48hrs and to resume your normal  diet.  This test typically takes about 30 minutes to perform.  Eat slowly, chew carefully, cut food into small bites

## 2019-12-12 NOTE — Progress Notes (Signed)
Attending Physician's Attestation   I have reviewed the chart.   I agree with the Advanced Practitioner's note, impression, and recommendations with any updates as below.  Does not clearly sound to be a structural issue but agreed that rule out of aspiration and a stricture is reasonable.  If barium swallow and modified barium swallow are unremarkable then EGD is next step.  Patient may need a manometry as well.  Colon cancer screening in future also needed.  Look forward to seeing what the barium studies show.  [name], MD [facility] Office # [phone]

## 2019-12-14 DIAGNOSIS — M5136 Other intervertebral disc degeneration, lumbar region: Secondary | ICD-10-CM | POA: Diagnosis not present

## 2019-12-15 ENCOUNTER — Ambulatory Visit (HOSPITAL_COMMUNITY)
Admission: RE | Admit: 2019-12-15 | Discharge: 2019-12-15 | Disposition: A | Discharge status: Home / Self Care | Payer: BC Managed Care – PPO | Source: Ambulatory Visit | Attending: Physician Assistant | Admitting: Physician Assistant

## 2019-12-15 ENCOUNTER — Other Ambulatory Visit: Payer: Self-pay

## 2019-12-15 DIAGNOSIS — R054 Cough syncope: Secondary | ICD-10-CM

## 2019-12-15 DIAGNOSIS — R198 Other specified symptoms and signs involving the digestive system and abdomen: Secondary | ICD-10-CM

## 2019-12-15 DIAGNOSIS — R0989 Other specified symptoms and signs involving the circulatory and respiratory systems: Secondary | ICD-10-CM

## 2019-12-15 DIAGNOSIS — K219 Gastro-esophageal reflux disease without esophagitis: Secondary | ICD-10-CM | POA: Insufficient documentation

## 2019-12-15 DIAGNOSIS — R55 Syncope and collapse: Secondary | ICD-10-CM

## 2019-12-15 DIAGNOSIS — R05 Cough: Secondary | ICD-10-CM | POA: Insufficient documentation

## 2019-12-15 DIAGNOSIS — K449 Diaphragmatic hernia without obstruction or gangrene: Secondary | ICD-10-CM | POA: Diagnosis not present

## 2019-12-16 DIAGNOSIS — M47816 Spondylosis without myelopathy or radiculopathy, lumbar region: Secondary | ICD-10-CM | POA: Insufficient documentation

## 2019-12-21 DIAGNOSIS — M5136 Other intervertebral disc degeneration, lumbar region: Secondary | ICD-10-CM | POA: Diagnosis not present

## 2019-12-28 DIAGNOSIS — M5136 Other intervertebral disc degeneration, lumbar region: Secondary | ICD-10-CM | POA: Diagnosis not present

## 2019-12-30 DIAGNOSIS — M47816 Spondylosis without myelopathy or radiculopathy, lumbar region: Secondary | ICD-10-CM | POA: Diagnosis not present

## 2020-01-02 ENCOUNTER — Other Ambulatory Visit: Payer: Self-pay

## 2020-01-02 ENCOUNTER — Observation Stay (HOSPITAL_COMMUNITY)
Admission: EM | Admit: 2020-01-02 | Discharge: 2020-01-04 | Disposition: A | Discharge status: Home / Self Care | Payer: BC Managed Care – PPO | Attending: Internal Medicine | Admitting: Internal Medicine

## 2020-01-02 DIAGNOSIS — K219 Gastro-esophageal reflux disease without esophagitis: Secondary | ICD-10-CM | POA: Insufficient documentation

## 2020-01-02 DIAGNOSIS — T1490XA Injury, unspecified, initial encounter: Secondary | ICD-10-CM | POA: Diagnosis not present

## 2020-01-02 DIAGNOSIS — M8588 Other specified disorders of bone density and structure, other site: Secondary | ICD-10-CM | POA: Diagnosis not present

## 2020-01-02 DIAGNOSIS — K449 Diaphragmatic hernia without obstruction or gangrene: Secondary | ICD-10-CM | POA: Insufficient documentation

## 2020-01-02 DIAGNOSIS — Z888 Allergy status to other drugs, medicaments and biological substances status: Secondary | ICD-10-CM | POA: Insufficient documentation

## 2020-01-02 DIAGNOSIS — F17200 Nicotine dependence, unspecified, uncomplicated: Secondary | ICD-10-CM | POA: Diagnosis present

## 2020-01-02 DIAGNOSIS — M545 Low back pain: Secondary | ICD-10-CM | POA: Diagnosis not present

## 2020-01-02 DIAGNOSIS — Z6830 Body mass index (BMI) 30.0-30.9, adult: Secondary | ICD-10-CM | POA: Insufficient documentation

## 2020-01-02 DIAGNOSIS — W19XXXA Unspecified fall, initial encounter: Secondary | ICD-10-CM | POA: Insufficient documentation

## 2020-01-02 DIAGNOSIS — F411 Generalized anxiety disorder: Secondary | ICD-10-CM | POA: Diagnosis present

## 2020-01-02 DIAGNOSIS — J449 Chronic obstructive pulmonary disease, unspecified: Secondary | ICD-10-CM | POA: Diagnosis present

## 2020-01-02 DIAGNOSIS — E669 Obesity, unspecified: Secondary | ICD-10-CM | POA: Insufficient documentation

## 2020-01-02 DIAGNOSIS — Z7951 Long term (current) use of inhaled steroids: Secondary | ICD-10-CM | POA: Diagnosis not present

## 2020-01-02 DIAGNOSIS — R05 Cough: Secondary | ICD-10-CM | POA: Diagnosis not present

## 2020-01-02 DIAGNOSIS — M7022 Olecranon bursitis, left elbow: Secondary | ICD-10-CM | POA: Diagnosis not present

## 2020-01-02 DIAGNOSIS — R251 Tremor, unspecified: Secondary | ICD-10-CM | POA: Diagnosis not present

## 2020-01-02 DIAGNOSIS — Z951 Presence of aortocoronary bypass graft: Secondary | ICD-10-CM | POA: Insufficient documentation

## 2020-01-02 DIAGNOSIS — R55 Syncope and collapse: Principal | ICD-10-CM | POA: Diagnosis present

## 2020-01-02 DIAGNOSIS — M542 Cervicalgia: Secondary | ICD-10-CM | POA: Diagnosis not present

## 2020-01-02 DIAGNOSIS — M71169 Other infective bursitis, unspecified knee: Secondary | ICD-10-CM | POA: Diagnosis not present

## 2020-01-02 DIAGNOSIS — Z7982 Long term (current) use of aspirin: Secondary | ICD-10-CM | POA: Insufficient documentation

## 2020-01-02 DIAGNOSIS — S52022A Displaced fracture of olecranon process without intraarticular extension of left ulna, initial encounter for closed fracture: Secondary | ICD-10-CM | POA: Diagnosis not present

## 2020-01-02 DIAGNOSIS — I10 Essential (primary) hypertension: Secondary | ICD-10-CM | POA: Diagnosis present

## 2020-01-02 DIAGNOSIS — F17210 Nicotine dependence, cigarettes, uncomplicated: Secondary | ICD-10-CM | POA: Insufficient documentation

## 2020-01-02 DIAGNOSIS — I2510 Atherosclerotic heart disease of native coronary artery without angina pectoris: Secondary | ICD-10-CM | POA: Insufficient documentation

## 2020-01-02 DIAGNOSIS — Z79899 Other long term (current) drug therapy: Secondary | ICD-10-CM | POA: Diagnosis not present

## 2020-01-02 DIAGNOSIS — M25522 Pain in left elbow: Secondary | ICD-10-CM | POA: Diagnosis not present

## 2020-01-02 DIAGNOSIS — M7989 Other specified soft tissue disorders: Secondary | ICD-10-CM | POA: Diagnosis not present

## 2020-01-02 DIAGNOSIS — M79632 Pain in left forearm: Secondary | ICD-10-CM | POA: Diagnosis not present

## 2020-01-02 DIAGNOSIS — M7110 Other infective bursitis, unspecified site: Secondary | ICD-10-CM

## 2020-01-02 DIAGNOSIS — R5381 Other malaise: Secondary | ICD-10-CM | POA: Diagnosis not present

## 2020-01-02 DIAGNOSIS — E785 Hyperlipidemia, unspecified: Secondary | ICD-10-CM | POA: Diagnosis not present

## 2020-01-02 DIAGNOSIS — S52025A Nondisplaced fracture of olecranon process without intraarticular extension of left ulna, initial encounter for closed fracture: Secondary | ICD-10-CM | POA: Insufficient documentation

## 2020-01-02 DIAGNOSIS — R4020 Unspecified coma: Secondary | ICD-10-CM

## 2020-01-02 DIAGNOSIS — Z20822 Contact with and (suspected) exposure to covid-19: Secondary | ICD-10-CM | POA: Diagnosis not present

## 2020-01-02 DIAGNOSIS — E66811 Obesity, class 1: Secondary | ICD-10-CM | POA: Diagnosis present

## 2020-01-02 DIAGNOSIS — Z720 Tobacco use: Secondary | ICD-10-CM | POA: Diagnosis present

## 2020-01-02 HISTORY — DX: Panic disorder (episodic paroxysmal anxiety): F41.0

## 2020-01-02 HISTORY — DX: Gastro-esophageal reflux disease without esophagitis: K21.9

## 2020-01-02 LAB — CBC
HCT: 49 % (ref 39.0–52.0)
Hemoglobin: 15.8 g/dL (ref 13.0–17.0)
MCH: 29.3 pg (ref 26.0–34.0)
MCHC: 32.2 g/dL (ref 30.0–36.0)
MCV: 90.7 fL (ref 80.0–100.0)
Platelets: 451 10*3/uL — ABNORMAL HIGH (ref 150–400)
RBC: 5.4 MIL/uL (ref 4.22–5.81)
RDW: 13.2 % (ref 11.5–15.5)
WBC: 7.9 10*3/uL (ref 4.0–10.5)
nRBC: 0 % (ref 0.0–0.2)

## 2020-01-02 LAB — CBG MONITORING, ED: Glucose-Capillary: 74 mg/dL (ref 70–99)

## 2020-01-02 MED ORDER — SODIUM CHLORIDE 0.9% FLUSH
3.0000 mL | Freq: Once | INTRAVENOUS | Status: AC
  Administered 2020-01-03: 10:00:00 3 mL via INTRAVENOUS

## 2020-01-02 NOTE — ED Triage Notes (Signed)
Pt said he passed out tonight and has happen 2 times before in march. Pt said pressures were low when he checked. Pt said has been to  Several dr's to have checked for his syncopal episodes but has had nothing come back negative

## 2020-01-03 ENCOUNTER — Observation Stay (HOSPITAL_COMMUNITY): Payer: BC Managed Care – PPO

## 2020-01-03 ENCOUNTER — Emergency Department (HOSPITAL_COMMUNITY): Payer: BC Managed Care – PPO

## 2020-01-03 ENCOUNTER — Encounter (HOSPITAL_COMMUNITY): Payer: Self-pay | Admitting: Internal Medicine

## 2020-01-03 DIAGNOSIS — M545 Low back pain: Secondary | ICD-10-CM | POA: Diagnosis not present

## 2020-01-03 DIAGNOSIS — M7989 Other specified soft tissue disorders: Secondary | ICD-10-CM | POA: Diagnosis not present

## 2020-01-03 DIAGNOSIS — S52025A Nondisplaced fracture of olecranon process without intraarticular extension of left ulna, initial encounter for closed fracture: Secondary | ICD-10-CM | POA: Diagnosis not present

## 2020-01-03 DIAGNOSIS — F411 Generalized anxiety disorder: Secondary | ICD-10-CM

## 2020-01-03 DIAGNOSIS — I10 Essential (primary) hypertension: Secondary | ICD-10-CM | POA: Diagnosis not present

## 2020-01-03 DIAGNOSIS — M79632 Pain in left forearm: Secondary | ICD-10-CM | POA: Diagnosis not present

## 2020-01-03 DIAGNOSIS — R4020 Unspecified coma: Secondary | ICD-10-CM | POA: Diagnosis not present

## 2020-01-03 DIAGNOSIS — J449 Chronic obstructive pulmonary disease, unspecified: Secondary | ICD-10-CM | POA: Diagnosis present

## 2020-01-03 DIAGNOSIS — E669 Obesity, unspecified: Secondary | ICD-10-CM | POA: Diagnosis not present

## 2020-01-03 DIAGNOSIS — R569 Unspecified convulsions: Secondary | ICD-10-CM | POA: Diagnosis not present

## 2020-01-03 DIAGNOSIS — M25522 Pain in left elbow: Secondary | ICD-10-CM | POA: Diagnosis not present

## 2020-01-03 DIAGNOSIS — Z720 Tobacco use: Secondary | ICD-10-CM

## 2020-01-03 DIAGNOSIS — R55 Syncope and collapse: Secondary | ICD-10-CM

## 2020-01-03 DIAGNOSIS — M7022 Olecranon bursitis, left elbow: Secondary | ICD-10-CM | POA: Diagnosis present

## 2020-01-03 DIAGNOSIS — S52022A Displaced fracture of olecranon process without intraarticular extension of left ulna, initial encounter for closed fracture: Secondary | ICD-10-CM | POA: Diagnosis present

## 2020-01-03 DIAGNOSIS — M542 Cervicalgia: Secondary | ICD-10-CM | POA: Diagnosis not present

## 2020-01-03 LAB — HIV ANTIBODY (ROUTINE TESTING W REFLEX): HIV Screen 4th Generation wRfx: NONREACTIVE

## 2020-01-03 LAB — URINALYSIS, ROUTINE W REFLEX MICROSCOPIC
Bilirubin Urine: NEGATIVE
Glucose, UA: NEGATIVE mg/dL
Hgb urine dipstick: NEGATIVE
Ketones, ur: NEGATIVE mg/dL
Leukocytes,Ua: NEGATIVE
Nitrite: NEGATIVE
Protein, ur: NEGATIVE mg/dL
Specific Gravity, Urine: 1.017 (ref 1.005–1.030)
pH: 6 (ref 5.0–8.0)

## 2020-01-03 LAB — BASIC METABOLIC PANEL
Anion gap: 14 (ref 5–15)
BUN: 21 mg/dL (ref 8–23)
CO2: 22 mmol/L (ref 22–32)
Calcium: 9.2 mg/dL (ref 8.9–10.3)
Chloride: 101 mmol/L (ref 98–111)
Creatinine, Ser: 1.05 mg/dL (ref 0.61–1.24)
GFR calc Af Amer: >60 mL/min (ref >60)
GFR calc non Af Amer: >60 mL/min (ref >60)
Glucose, Bld: 81 mg/dL (ref 70–99)
Potassium: 3.6 mmol/L (ref 3.5–5.1)
Sodium: 137 mmol/L (ref 135–145)

## 2020-01-03 LAB — HEMOGLOBIN A1C
Hgb A1c MFr Bld: 6 % — ABNORMAL HIGH (ref 4.8–5.6)
Mean Plasma Glucose: 125.5 mg/dL

## 2020-01-03 LAB — TROPONIN I (HIGH SENSITIVITY)
Troponin I (High Sensitivity): 5 ng/L (ref <18)
Troponin I (High Sensitivity): 5 ng/L (ref <18)

## 2020-01-03 LAB — SARS CORONAVIRUS 2 (TAT 6-24 HRS): SARS Coronavirus 2: NEGATIVE

## 2020-01-03 LAB — TSH: TSH: 0.841 u[IU]/mL (ref 0.350–4.500)

## 2020-01-03 MED ORDER — ALPRAZOLAM 0.5 MG PO TABS: 0.5000 mg | ORAL_TABLET | Freq: Two times a day (BID) | ORAL | Status: DC | PRN

## 2020-01-03 MED ORDER — ACETAMINOPHEN 325 MG PO TABS: 650.0000 mg | ORAL_TABLET | Freq: Four times a day (QID) | ORAL | Status: DC | PRN

## 2020-01-03 MED ORDER — ACETAMINOPHEN 650 MG RE SUPP: 650.0000 mg | Freq: Four times a day (QID) | RECTAL | Status: DC | PRN

## 2020-01-03 MED ORDER — ENOXAPARIN SODIUM 40 MG/0.4ML ~~LOC~~ SOLN
40.0000 mg | SUBCUTANEOUS | Status: DC
  Administered 2020-01-03: 16:00:00 40 mg via SUBCUTANEOUS
  Filled 2020-01-03: qty 0.4

## 2020-01-03 MED ORDER — CEFAZOLIN SODIUM-DEXTROSE 2-4 GM/100ML-% IV SOLN
2.0000 g | Freq: Three times a day (TID) | INTRAVENOUS | Status: DC
  Administered 2020-01-03 – 2020-01-04 (×2): 2 g via INTRAVENOUS
  Filled 2020-01-03 (×4): qty 100

## 2020-01-03 MED ORDER — ONDANSETRON HCL 4 MG/2ML IJ SOLN: 4.0000 mg | Freq: Four times a day (QID) | INTRAMUSCULAR | Status: DC | PRN

## 2020-01-03 MED ORDER — TETANUS-DIPHTH-ACELL PERTUSSIS 5-2.5-18.5 LF-MCG/0.5 IM SUSP: 0.5000 mL | Freq: Once | INTRAMUSCULAR | Status: DC

## 2020-01-03 MED ORDER — GADOBUTROL 1 MMOL/ML IV SOLN
9.0000 mL | Freq: Once | INTRAVENOUS | Status: AC | PRN
  Administered 2020-01-03: 9 mL via INTRAVENOUS

## 2020-01-03 MED ORDER — METOPROLOL SUCCINATE ER 25 MG PO TB24
25.0000 mg | ORAL_TABLET | Freq: Every day | ORAL | Status: DC
  Administered 2020-01-04: 25 mg via ORAL
  Filled 2020-01-03: qty 1

## 2020-01-03 MED ORDER — ALBUTEROL SULFATE (2.5 MG/3ML) 0.083% IN NEBU: 2.5000 mg | INHALATION_SOLUTION | RESPIRATORY_TRACT | Status: DC | PRN

## 2020-01-03 MED ORDER — NICOTINE 21 MG/24HR TD PT24
21.0000 mg | MEDICATED_PATCH | Freq: Every day | TRANSDERMAL | Status: DC
  Filled 2020-01-03: qty 1

## 2020-01-03 MED ORDER — PANTOPRAZOLE SODIUM 40 MG PO TBEC
40.0000 mg | DELAYED_RELEASE_TABLET | Freq: Every day | ORAL | Status: DC
  Administered 2020-01-03 – 2020-01-04 (×2): 40 mg via ORAL
  Filled 2020-01-03 (×2): qty 1

## 2020-01-03 MED ORDER — SODIUM CHLORIDE 0.9 % IV BOLUS
1000.0000 mL | Freq: Once | INTRAVENOUS | Status: AC
  Administered 2020-01-03: 1000 mL via INTRAVENOUS

## 2020-01-03 MED ORDER — CEFAZOLIN SODIUM-DEXTROSE 1-4 GM/50ML-% IV SOLN
1.0000 g | Freq: Once | INTRAVENOUS | Status: AC
  Administered 2020-01-03: 1 g via INTRAVENOUS
  Filled 2020-01-03: qty 50

## 2020-01-03 MED ORDER — ESCITALOPRAM OXALATE 10 MG PO TABS
10.0000 mg | ORAL_TABLET | Freq: Every day | ORAL | Status: DC
  Administered 2020-01-03 – 2020-01-04 (×2): 10 mg via ORAL
  Filled 2020-01-03 (×2): qty 1

## 2020-01-03 MED ORDER — LACTATED RINGERS IV SOLN
INTRAVENOUS | Status: DC
  Administered 2020-01-03: 1000 mL via INTRAVENOUS

## 2020-01-03 MED ORDER — ONDANSETRON HCL 4 MG PO TABS: 4.0000 mg | ORAL_TABLET | Freq: Four times a day (QID) | ORAL | Status: DC | PRN

## 2020-01-03 MED ORDER — ASPIRIN EC 81 MG PO TBEC
81.0000 mg | DELAYED_RELEASE_TABLET | Freq: Every day | ORAL | Status: DC
  Administered 2020-01-04: 81 mg via ORAL
  Filled 2020-01-03: qty 1

## 2020-01-03 MED ORDER — PRAVASTATIN SODIUM 40 MG PO TABS
80.0000 mg | ORAL_TABLET | Freq: Every day | ORAL | Status: DC
  Administered 2020-01-04: 80 mg via ORAL
  Filled 2020-01-03 (×2): qty 2

## 2020-01-03 MED ORDER — LIDOCAINE HCL (PF) 1 % IJ SOLN
5.0000 mL | Freq: Once | INTRAMUSCULAR | Status: AC
  Administered 2020-01-03: 13:00:00 5 mL
  Filled 2020-01-03: qty 5

## 2020-01-03 MED ORDER — SODIUM CHLORIDE 0.9% FLUSH
3.0000 mL | Freq: Two times a day (BID) | INTRAVENOUS | Status: DC
  Administered 2020-01-03: 3 mL via INTRAVENOUS

## 2020-01-03 MED ORDER — FLUTICASONE FUROATE-VILANTEROL 100-25 MCG/INH IN AEPB
1.0000 | INHALATION_SPRAY | Freq: Every morning | RESPIRATORY_TRACT | Status: DC
  Filled 2020-01-03: qty 28

## 2020-01-03 MED ORDER — GUAIFENESIN ER 600 MG PO TB12
1200.0000 mg | ORAL_TABLET | Freq: Two times a day (BID) | ORAL | Status: DC
  Administered 2020-01-03 – 2020-01-04 (×2): 1200 mg via ORAL
  Filled 2020-01-03 (×2): qty 2

## 2020-01-03 NOTE — Progress Notes (Signed)
EEG Completed; Results Pending  

## 2020-01-03 NOTE — Consult Note (Addendum)
Neurology Consultation  Reason for Consult: Syncope Referring Physician: Charlesetta Shanks, MD  CC: Multiple episodes of syncope  History is obtained from: Patient  HPI: Eric Saunders is a 64 y.o. male with history of tobacco abuse, hypertension, hyperlipidemia, CAD and multiple syncopal episodes of unknown origin.  Patient came to the emergency department today secondary to having another syncopal episode.  Neurology was asked to further evaluate as cardiology has seen patient does not believe it is cardiogenic.  Episodes are often when he is sitting down but they can happen when he is standing up.  All of the episodes are preceded with significant harsh coughing and a sensation as though his throat is closing up.  Patient states that he has had 5 episodes in the past week.  One of the episodes he states he woke and had significant ringing in his ears but this only occurred once.  All of the episodes are stereotypical, and all the episodes last for only few seconds to less than a minute.Marland Kitchen  Upon wakening he is aware of his surroundings, and he comes to pretty quick without any prolonged confusion.  His most recent episode which was noticed by his son who is in the room, he had his coughing spell followed by syncopal event which he was noted to have bilateral upper and lower extremity jerking which he thought was similar to what he has seen for people with convulsions.  No history of seizures in the past.  No family history of seizures.  He also describes one of the episodes when he was sitting with his family, he started coughing however his family was able to calm him down and the episode did not occur.  He is on Xanax at this time for anxiety.  He states that he does not take it all the time.  Does not abuse Xanax.   ED course  CT head with no acute findings  Chart review-patient has been seen by cardiology who is done a stress test which showed low risk of cardiac event.  He is also been  seen by gastroenterology which placed him on Benzonata for his cough.    Past Medical History:  Diagnosis Date  . Coronary artery disease   . Hyperlipemia   . Hypertension   . Tobacco abuse      Family History  Problem Relation Age of Onset  . Drug abuse Sister   . Healthy Brother   . Diabetes Sister   . Healthy Sister   . Healthy Sister   . Healthy Sister   . Healthy Sister   . Healthy Brother    Social History:   reports that he has been smoking cigarettes. He started smoking about 4 years ago. He has a 30.00 pack-year smoking history. He has never used smokeless tobacco. He reports current alcohol use. He reports that he does not use drugs.   Medications  Current Facility-Administered Medications:  .  lidocaine (PF) (XYLOCAINE) 1 % injection 5 mL, 5 mL, Infiltration, Once, Petrucelli, Samantha R, PA-C  Current Outpatient Medications:  .  ALPRAZolam (XANAX) 0.5 MG tablet, Take 1 tablet (0.5 mg total) by mouth 2 (two) times daily as needed for anxiety., Disp: 60 tablet, Rfl: 1 .  amLODipine (NORVASC) 10 MG tablet, Take 1 tablet (10 mg total) by mouth daily., Disp: 90 tablet, Rfl: 1 .  aspirin 81 MG tablet, Take 81 mg by mouth daily., Disp: , Rfl:  .  escitalopram (LEXAPRO) 10 MG tablet, Take 1  tablet (10 mg total) by mouth daily., Disp: 90 tablet, Rfl: 1 .  fenofibrate 160 MG tablet, Take 1 tablet (160 mg total) by mouth daily., Disp: 90 tablet, Rfl: 1 .  fluticasone furoate-vilanterol (BREO ELLIPTA) 100-25 MCG/INH AEPB, Inhale 1 puff into the lungs every morning., Disp: 1 each, Rfl: 5 .  hydrochlorothiazide (HYDRODIURIL) 25 MG tablet, Take 1 tablet (25 mg total) by mouth daily., Disp: 90 tablet, Rfl: 1 .  losartan (COZAAR) 100 MG tablet, Take 1 tablet (100 mg total) by mouth daily., Disp: 90 tablet, Rfl: 1 .  metoprolol succinate (TOPROL-XL) 25 MG 24 hr tablet, Take 1 tablet (25 mg total) by mouth daily., Disp: 90 tablet, Rfl: 1 .  omeprazole (PRILOSEC) 20 MG capsule, Take  1 capsule (20 mg total) by mouth in the morning and at bedtime. (Patient taking differently: Take 20 mg by mouth 2 (two) times daily before a meal. ), Disp: 60 capsule, Rfl: 3 .  pravastatin (PRAVACHOL) 80 MG tablet, Take 1 tablet (80 mg total) by mouth daily., Disp: 90 tablet, Rfl: 3 .  sildenafil (VIAGRA) 100 MG tablet, Take 0.5-1 tablets (50-100 mg total) by mouth daily as needed for erectile dysfunction., Disp: 5 tablet, Rfl: 11 .  benzonatate (TESSALON PERLES) 100 MG capsule, Take 1 capsule (100 mg total) by mouth 3 (three) times daily as needed for cough. (Patient not taking: Reported on 01/03/2020), Disp: 20 capsule, Rfl: 0 .  cyclobenzaprine (FLEXERIL) 10 MG tablet, Take 1 tablet (10 mg total) by mouth 3 (three) times daily as needed for muscle spasms. (Patient not taking: Reported on 01/03/2020), Disp: 30 tablet, Rfl: 1 .  ibuprofen (IBU) 800 MG tablet, TAKE ONE TABLET BY MOUTH 3 TIMES DAILY AS NEEDED (Patient not taking: Reported on 01/03/2020), Disp: 90 tablet, Rfl: 0  ROS:    General ROS: negative for - chills, fatigue, fever, night sweats, weight gain or weight loss Psychological ROS: negative for - behavioral disorder, hallucinations, memory difficulties, mood swings or suicidal ideation Ophthalmic ROS: negative for - blurry vision, double vision, eye pain or loss of vision ENT ROS: negative for - epistaxis, nasal discharge, oral lesions, sore throat, tinnitus or vertigo Allergy and Immunology ROS: negative for - hives or itchy/watery eyes Hematological and Lymphatic ROS: negative for - bleeding problems, bruising or swollen lymph nodes Endocrine ROS: negative for - galactorrhea, hair pattern changes, polydipsia/polyuria or temperature intolerance Respiratory ROS: Positive for - cough Cardiovascular ROS: negative for - chest pain, dyspnea on exertion, edema or irregular heartbeat Gastrointestinal ROS: negative for - abdominal pain, diarrhea, hematemesis, nausea/vomiting or stool  incontinence Genito-Urinary ROS: negative for - dysuria, hematuria, incontinence or urinary frequency/urgency Musculoskeletal ROS: negative for - joint swelling or muscular weakness Neurological ROS: as noted in HPI Dermatological ROS: Positive for multiple lacerations to the left arm from his fall  Exam: Current vital signs: BP 96/60   Pulse 65   Temp 98.3 F (36.8 C) (Oral)   Resp 15   SpO2 94%  Vital signs in last 24 hours: Temp:  [98.3 F (36.8 C)-98.5 F (36.9 C)] 98.3 F (36.8 C) (04/19 [id number]) Pulse Rate:  [56-72] 65 (04/19 0945) Resp:  [15-19] 15 (04/19 0945) BP: (96-149)/(60-104) 96/60 (04/19 0715) SpO2:  [92 %-99 %] 94 % (04/19 0945)  Constitutional: Appears well-developed and well-nourished.  Psych: Affect appropriate to situation Eyes: No scleral injection HENT: No OP obstrucion Head: Normocephalic.  Cardiovascular: Normal rate and regular rhythm.  Respiratory: Effort normal, non-labored breathing GI: Soft.  No  distension. There is no tenderness.  Skin: left arm is multiple lacerations secondary to fall  Neuro: Mental Status: Patient is awake, alert, oriented to person, place, month, year, and situation. Speech-within normal limits, able to name, repeat and comprehension intact. Patient is able to give a clear and coherent history. Cranial Nerves: II: Visual Fields are full.  III,IV, VI: EOMI without ptosis or diploplia. Pupils equal, round and reactive to light V: Facial sensation is symmetric to temperature VII: Facial movement is symmetric.  VIII: hearing is intact to voice X: Palat elevates symmetrically XI: Shoulder shrug is symmetric. XII: tongue is midline without atrophy or fasciculations.  Motor: Tone is normal. Bulk is normal. 5/5 strength was present in all four extremities with the exception of the left arm that has a fracture in is unable to really move.  No drift No aterixis Sensory: Sensation is symmetric to light touch and temperature in  the arms and legs. Deep Tendon Reflexes: 2+ and symmetric in the biceps and patellae.  Plantars: Toes are downgoing bilaterally.  Cerebellar: FNF and HKS are intact bilaterally  Labs I have reviewed labs in epic and the results pertinent to this consultation are: CBC    Component Value Date/Time   WBC 7.9 01/02/2020 2312   RBC 5.40 01/02/2020 2312   HGB 15.8 01/02/2020 2312   HGB 16.2 05/22/2017 0950   HCT 49.0 01/02/2020 2312   HCT 48.6 05/22/2017 0950   PLT 451 (H) 01/02/2020 2312   PLT 325 05/22/2017 0950   MCV 90.7 01/02/2020 2312   MCV 88 05/22/2017 0950   MCH 29.3 01/02/2020 2312   MCHC 32.2 01/02/2020 2312   RDW 13.2 01/02/2020 2312   RDW 14.2 05/22/2017 0950   LYMPHSABS 2.4 05/22/2017 0950   MONOABS 0.7 06/26/2014 1323   EOSABS 0.3 05/22/2017 0950   BASOSABS 0.0 05/22/2017 0950    CMP     Component Value Date/Time   NA 137 01/02/2020 2312   NA 143 04/06/2019 0908   K 3.6 01/02/2020 2312   CL 101 01/02/2020 2312   CO2 22 01/02/2020 2312   GLUCOSE 81 01/02/2020 2312   BUN 21 01/02/2020 2312   BUN 23 04/06/2019 0908   CREATININE 1.05 01/02/2020 2312   CREATININE 1.04 08/10/2014 0913   CALCIUM 9.2 01/02/2020 2312   PROT 7.4 04/06/2019 0908   ALBUMIN 4.8 04/06/2019 0908   AST 16 04/06/2019 0908   ALT 20 04/06/2019 0908   ALKPHOS 54 04/06/2019 0908   BILITOT 0.2 04/06/2019 0908   GFRNONAA >60 01/02/2020 2312   GFRNONAA 59 (L) 06/10/2014 1739   GFRAA >60 01/02/2020 2312   GFRAA 69 06/10/2014 1739    Lipid Panel     Component Value Date/Time   CHOL 205 (H) 04/06/2019 0908   CHOL 166 12/28/2012 0932   TRIG 173 (H) 04/06/2019 0908   TRIG 157 (H) 11/23/2014 1549   TRIG 129 12/28/2012 0932   HDL 43 04/06/2019 0908   HDL 44 11/23/2014 1549   HDL 42 12/28/2012 0932   CHOLHDL 4.8 04/06/2019 0908   LDLCALC 127 (H) 04/06/2019 0908   LDLCALC 90 07/06/2014 0000   LDLCALC 98 12/28/2012 0932     Imaging I have reviewed the images obtained:  CT-scan  of the brain -no acute changes CT of the C-spine with no C-spine fracture or subluxation.  Multilevel DJD.  Etta Quill PA-C [facility] [phone]  M-F  (9:00 am- 5:00 PM)  01/03/2020, 10:47 AM   Attending addendum  Patient seen and examined History dependently obtained.  Independently examined. Imaging independently reviewed.   Assessment:  64 year old male with multiple episodes of passing out associated with significant coughing.  Each spell appears to be stereotypical.  Patient has had a cardiovascular work-up along with GI work-up with no etiology.  Each event patient loses consciousness for only few seconds to less than a minute and becomes quickly aware of his surroundings after without prolonged ensuing confusion.   Given patient's stereotypic events along with episodes of jerking of whole body, at this time differential diagnosis would include syncopal convulsion versus seizure.  Impression: -Syncopal conversion versus  possible seizure activity  Recommendations: -EEG -MRI brain with and without contrast. -I would do an echocardiogram. -Keep overnight for observation -Will not initiate antiepileptic medication at this time until all studies have returned and we will update recommendations after the above testing has been completed  Plan was discussed with the ED provider  We will follow with you  -- Amie Portland, MD [facility] Pager: [phone] If 7pm to 7am, please call on call as listed on AMION.

## 2020-01-03 NOTE — Consult Note (Signed)
Reason for Consult:Left elbow injury Referring Physician: Justice Saunders is an 64 y.o. male.  HPI: Eric Saunders came to the ED with c/o syncopal events. He fell about 4 weeks ago and hurt his left elbow. About 2 weeks ago he noticed it was burning and it proceeded over the next week to worsen. He was able to squeeze it until he was able to get some watery pus out of it. It has remained stable but not really improved since then. He denies fevers, chills, sweats, N/V. He is RHD.  Past Medical History:  Diagnosis Date  . Coronary artery disease   . GERD (gastroesophageal reflux disease)   . Hyperlipemia   . Hypertension   . Panic disorder   . Tobacco abuse     Past Surgical History:  Procedure Laterality Date  . CORONARY ARTERY BYPASS GRAFT N/A 06/17/2014   Procedure: CORONARY ARTERY BYPASS GRAFTING (CABG) X 4, RIGHT LEG EVH;  Surgeon: Gaye Pollack, MD;  Location: [facility];  Service: Open Heart Surgery;  Laterality: N/A;  . KNEE SURGERY     bilateral arthroscopic  . LEFT HEART CATHETERIZATION WITH CORONARY ANGIOGRAM N/A 06/16/2014   Procedure: LEFT HEART CATHETERIZATION WITH CORONARY ANGIOGRAM;  Surgeon: Burnell Blanks, MD;  Location: [facility] CATH LAB;  Service: Cardiovascular;  Laterality: N/A;  . TEE WITHOUT CARDIOVERSION N/A 06/17/2014   Procedure: TRANSESOPHAGEAL ECHOCARDIOGRAM (TEE);  Surgeon: Gaye Pollack, MD;  Location: [facility];  Service: Open Heart Surgery;  Laterality: N/A;    Family History  Problem Relation Age of Onset  . Drug abuse Sister   . Healthy Brother   . Diabetes Sister   . Healthy Sister   . Healthy Sister   . Healthy Sister   . Healthy Sister   . Healthy Brother     Social History:  reports that he quit smoking about 70 years ago. His smoking use included cigarettes. He has a 45.00 pack-year smoking history. He has never used smokeless tobacco. He reports current alcohol use. He reports that he does not use drugs.  Allergies:  Allergies  Allergen  Reactions  . Ace Inhibitors Cough  . Other     Medications: I have reviewed the patient's current medications.  Results for orders placed or performed during the hospital encounter of 01/02/20 (from the past 48 hour(s))  Basic metabolic panel     Status: None   Collection Time: 01/02/20 11:12 PM  Result Value Ref Range   Sodium 137 135 - 145 mmol/L   Potassium 3.6 3.5 - 5.1 mmol/L   Chloride 101 98 - 111 mmol/L   CO2 22 22 - 32 mmol/L   Glucose, Bld 81 70 - 99 mg/dL    Comment: Glucose reference range applies only to samples taken after fasting for at least 8 hours.   BUN 21 8 - 23 mg/dL   Creatinine, Ser 1.05 0.61 - 1.24 mg/dL   Calcium 9.2 8.9 - 10.3 mg/dL   GFR calc non Af Amer >60 >60 mL/min   GFR calc Af Amer >60 >60 mL/min   Anion gap 14 5 - 15    Comment: Performed at [facility] [address] [place] [address]  CBC     Status: Abnormal   Collection Time: 01/02/20 11:12 PM  Result Value Ref Range   WBC 7.9 4.0 - 10.5 K/uL   RBC 5.40 4.22 - 5.81 MIL/uL   Hemoglobin 15.8 13.0 - 17.0 g/dL  HCT 49.0 39.0 - 52.0 %   MCV 90.7 80.0 - 100.0 fL   MCH 29.3 26.0 - 34.0 pg   MCHC 32.2 30.0 - 36.0 g/dL   RDW 13.2 11.5 - 15.5 %   Platelets 451 (H) 150 - 400 K/uL   nRBC 0.0 0.0 - 0.2 %    Comment: Performed at [facility] [address] [place], [place] [address]  CBG monitoring, ED     Status: None   Collection Time: 01/02/20 11:13 PM  Result Value Ref Range   Glucose-Capillary 74 70 - 99 mg/dL    Comment: Glucose reference range applies only to samples taken after fasting for at least 8 hours.   Comment 1 Notify RN    Comment 2 Document in Chart   Troponin I (High Sensitivity)     Status: None   Collection Time: 01/03/20  8:24 AM  Result Value Ref Range   Troponin I (High Sensitivity) 5 <18 ng/L    Comment: (NOTE) Elevated high sensitivity troponin I (hsTnI) values and significant  changes across serial measurements may suggest ACS but many  other  chronic and acute conditions are known to elevate hsTnI results.  Refer to the "Links" section for chest pain algorithms and additional  guidance. Performed at [facility], [place] [address], [place], [place] [address]   Troponin I (High Sensitivity)     Status: None   Collection Time: 01/03/20  9:07 AM  Result Value Ref Range   Troponin I (High Sensitivity) 5 <18 ng/L    Comment: (NOTE) Elevated high sensitivity troponin I (hsTnI) values and significant  changes across serial measurements may suggest ACS but many other  chronic and acute conditions are known to elevate hsTnI results.  Refer to the "Links" section for chest pain algorithms and additional  guidance. Performed at [facility], [place] [address] [place], [place] [address]   Urinalysis, Routine w reflex microscopic     Status: None   Collection Time: 01/03/20  9:44 AM  Result Value Ref Range   Color, Urine YELLOW YELLOW   APPearance CLEAR CLEAR   Specific Gravity, Urine 1.017 1.005 - 1.030   pH 6.0 5.0 - 8.0   Glucose, UA NEGATIVE NEGATIVE mg/dL   Hgb urine dipstick NEGATIVE NEGATIVE   Bilirubin Urine NEGATIVE NEGATIVE   Ketones, ur NEGATIVE NEGATIVE mg/dL   Protein, ur NEGATIVE NEGATIVE mg/dL   Nitrite NEGATIVE NEGATIVE   Leukocytes,Ua NEGATIVE NEGATIVE    Comment: Performed at [facility] [address] [place], [place] [address]    No results found.  Review of Systems  Constitutional: Negative for chills, diaphoresis and fever.  HENT: Negative for ear discharge, ear pain, hearing loss and tinnitus.   Eyes: Negative for photophobia and pain.  Respiratory: Positive for cough. Negative for shortness of breath.   Cardiovascular: Negative for chest pain.  Gastrointestinal: Negative for abdominal pain, nausea and vomiting.  Genitourinary: Negative for dysuria, flank pain, frequency and urgency.  Musculoskeletal: Positive for arthralgias (Left elbow). Negative for back pain, myalgias and  neck pain.  Neurological: Positive for syncope. Negative for dizziness and headaches.  Hematological: Does not bruise/bleed easily.  Psychiatric/Behavioral: The patient is not nervous/anxious.    Blood pressure 96/60, pulse 65, temperature 98.3 F (36.8 C), temperature source Oral, resp. rate 15, SpO2 94 %. Physical Exam  Constitutional: He appears well-developed and well-nourished. No distress.  HENT:  Head: Normocephalic and atraumatic.  Eyes: Conjunctivae  are normal. Right eye exhibits no discharge. Left eye exhibits no discharge. No scleral icterus.  Cardiovascular: Normal rate and regular rhythm.  Respiratory: Effort normal. No respiratory distress.  Musculoskeletal:     Cervical back: Normal range of motion.     Comments: Left shoulder, elbow, wrist, digits- Multiple abrasions/lacs in various stages of healing, elbow hot, TTP, swollen, no instability, no blocks to motion  Sens  Ax/R/M/U intact  Mot   Ax/ R/ PIN/ M/ AIN/ U intact  Rad 2+  Neurological: He is alert.  Skin: Skin is warm and dry. He is not diaphoretic.  Psychiatric: He has a normal mood and affect. His behavior is normal.    Assessment/Plan: Left elbow fxs -- These fxs can be WBAT. Left olecranon bursitis -- Will see if he responds to abx therapy. If he fails to improve over the next 48h may need I&D and bursectomy but optimistic that can be avoided. He should f/u with Dr. Stann Mainland upon discharge. Multiple medical problems including CAD s/p CAB x 4, HTN, hyperlipidemia, gout, generalized anxiety disorder, panic attacks, tobacco use disorder, and alcohol use disorder -- per primary service    Lisette Abu, PA-C Orthopedic Surgery [phone] 01/03/2020, 2:08 PM

## 2020-01-03 NOTE — ED Provider Notes (Signed)
[facility] EMERGENCY DEPARTMENT Provider Note   CSN: [id number] Arrival date & time: 01/02/20  2252     History Chief Complaint  Patient presents with  . Loss of Consciousness    Eric Saunders is a 64 y.o. male with history of CAD s/p CAB x 4, HTN, hyperlipidemia, gout, generalized anxiety disorder, panic attacks, tobacco use disorder, and alcohol use disorder who presents to the emergency department with a chief complaint of syncope.  The patient has been having syncopal episodes since December 2020 that have been increasing in frequency over the last month.  He has had 5 episodes over the last 4 weeks.  His most recent episode was 4/18.  Although episodes began having regularly since December 2020, he notes that his first episode was in 2015.  During his most recent syncopal episode, he fell onto his left side is endorsing left arm pain and has several skin tears to the left forearm.  He also lost his footing earlier this week and fell backwards and hit his head.  He did not have a loss of consciousness at that time.  Each of the episodes began with a severe coughing fit.  The patient will then feel as if he is choking, start to see white stars in his vision, become dizzy and lightheaded, and will develop a tremor in the left arm before passing out.  He is unsure how long the episodes last.    The patient's son is at bedside and reports that he has only witnessed one of the episodes, back in December, and the patient was noted to have jerking and shaking of his bilateral arms and legs.  He denies chest pain, shortness of breath, numbness, weakness, headache, abdominal pain, nausea, vomiting, diarrhea, incontinence associated with the episodes.  He notes that the cough he has prior to the episode seems "different" than his usual smoker's cough.  He has not discovered any aggravating factors.  He has been able to prevent episodes from happening by taking slow, deep  breaths once the coughing begins.  He has developed bilateral tinnitus that has persisted for the last month.  He has had a global headache over the last 24 hours, but reports that he does not have headaches associated with his episodes.   He also has been having bilateral low back pain that has been worsening over the last few months. He was seen by Dr. Gladstone Lighter in December 2020 and had an MRI of the spine that demonstrated an indeterminate sclerotic L1 lesion.  He underwent a bone scan that had indeterminate findings as there was no abnormal bone scan activity within the lesion.  There was possible indeterminate activity in the mid sacrum on the posterior images without clear correlation on recent lumbar MRI and it was felt to be possibly posttraumatic.  A repeat lumbar MRI was recommended in 6 months.  His only medication changes over the last few months include starting omeprazole and his pravastatin was changed from 40 mg to 80 mg then back down to 40 mg.  He is not taking any over-the-counter supplements or vitamins.  08/23/19-  Seen by primary care for symptoms.  He had basic labs completed as well as a soft tissue x-ray of the neck that was unremarkable.  He was started on 20 mg of omeprazole daily as the patient was endorsing significant GERD.  Coughing was thought to be vasovagal.  10/07/19- Seen again by primary care.  He had a chest x-ray  and CT chest for lung cancer screening ordered.  He was recommended for evaluation of bronchospasm. He was restarted on Xanax for panic attacks.  He was given Ladona Ridgel for cough.  He was started on Breo for mucopurulent chronic bronchitis.  10/19/19-followed up with Dr. Percival Spanish with cardiology.  He had an exercise treadmill stress test in 2020 that did not show any evidence of ischemia.  He did have some hypertensive blood pressure response, but no ischemia.  He recommended considering a swallowing study versus a GI study.  He was given a referral to  gastroenterology.  12/10/19-he was seen by GI purely Dr. Rush Landmark that it did not clearly sound to be a structural issue but agreed to rule out aspiration and a stricture.  Felt if barium swallow and modified barium swallow are unremarkable then EGD as next step.  He may also need a manometry as well.  Colon cancer screening the future is also needed.  Barium swallow with a small reducible hiatal hernia with mild gastroesophageal reflux.  There is no evidence of esophageal stricture, ulceration, or stricture.  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  The patient reports that he is being referred to ENT, but has not yet been evaluated.   The patient endorses tobacco use.  Reports he is a 1.0-1.5 ppd smoker.  He reports that he has increased his tobacco use over the last few months.  He also endorses alcohol use.  Of note, patient reports that prior to his last syncopal episode on 4/18 that he had consumed approximately 7-8 beers.  He is adamant that alcohol has not been associated with previous episodes.  No history of alcohol withdrawal or withdrawal seizures.  He denies any other illicit or recreational substance use.   The history is provided by the patient and medical records. No language interpreter was used.       Past Medical History:  Diagnosis Date  . Coronary artery disease   . Hyperlipemia   . Hypertension   . Tobacco abuse     Patient Active Problem List   Diagnosis Date Noted  . Cough syncope 12/10/2019  . Encounter for screening for lung cancer 10/19/2019  . Educated about COVID-19 virus infection 10/17/2019  . BMI 29.0-29.9,adult 04/06/2019  . Essential hypertension 04/06/2019  . Tobacco abuse 08/16/2014  . Acute gouty arthritis 06/27/2014  . Constipation 06/26/2014  . CAD (coronary artery disease) 06/17/2014  . Abnormal EKG 01/25/2014  . Hyperlipidemia with target LDL less than 100 12/28/2012    . GAD (generalized anxiety disorder) 12/28/2012    Past Surgical History:  Procedure Laterality Date  . CORONARY ARTERY BYPASS GRAFT N/A 06/17/2014   Procedure: CORONARY ARTERY BYPASS GRAFTING (CABG) X 4, RIGHT LEG EVH;  Surgeon: Gaye Pollack, MD;  Location: [facility];  Service: Open Heart Surgery;  Laterality: N/A;  . KNEE SURGERY     bilateral arthroscopic  . LEFT HEART CATHETERIZATION WITH CORONARY ANGIOGRAM N/A 06/16/2014   Procedure: LEFT HEART CATHETERIZATION WITH CORONARY ANGIOGRAM;  Surgeon: Burnell Blanks, MD;  Location: [facility] CATH LAB;  Service: Cardiovascular;  Laterality: N/A;  . TEE WITHOUT CARDIOVERSION N/A 06/17/2014   Procedure: TRANSESOPHAGEAL ECHOCARDIOGRAM (TEE);  Surgeon: Gaye Pollack, MD;  Location: [facility];  Service: Open Heart Surgery;  Laterality: N/A;       Family History  Problem Relation Age of Onset  . Drug abuse Sister   . Healthy Brother   .  Diabetes Sister   . Healthy Sister   . Healthy Sister   . Healthy Sister   . Healthy Sister   . Healthy Brother     Social History   Tobacco Use  . Smoking status: Current Every Day Smoker    Packs/day: 1.00    Years: 30.00    Pack years: 30.00    Types: Cigarettes    Start date: 12/28/2015  . Smokeless tobacco: Never Used  Substance Use Topics  . Alcohol use: Yes    Alcohol/week: 0.0 standard drinks    Comment: occasionally  . Drug use: No    Home Medications Prior to Admission medications   Medication Sig Start Date End Date Taking? Authorizing Provider  ALPRAZolam Duanne Moron) 0.5 MG tablet Take 1 tablet (0.5 mg total) by mouth 2 (two) times daily as needed for anxiety. 10/07/19  Yes Martin, Mary-Margaret, FNP  amLODipine (NORVASC) 10 MG tablet Take 1 tablet (10 mg total) by mouth daily. 10/07/19  Yes Hassell Done, Mary-Margaret, FNP  aspirin 81 MG tablet Take 81 mg by mouth daily.   Yes [provider]  escitalopram (LEXAPRO) 10 MG tablet Take 1 tablet (10 mg total) by mouth daily. 10/07/19  Yes  Martin, Mary-Margaret, FNP  fenofibrate 160 MG tablet Take 1 tablet (160 mg total) by mouth daily. 10/07/19  Yes Martin, Mary-Margaret, FNP  fluticasone furoate-vilanterol (BREO ELLIPTA) 100-25 MCG/INH AEPB Inhale 1 puff into the lungs every morning. 10/07/19  Yes Hassell Done, Mary-Margaret, FNP  hydrochlorothiazide (HYDRODIURIL) 25 MG tablet Take 1 tablet (25 mg total) by mouth daily. 10/07/19  Yes Martin, Mary-Margaret, FNP  losartan (COZAAR) 100 MG tablet Take 1 tablet (100 mg total) by mouth daily. 10/07/19  Yes Hassell Done, Mary-Margaret, FNP  metoprolol succinate (TOPROL-XL) 25 MG 24 hr tablet Take 1 tablet (25 mg total) by mouth daily. 10/07/19  Yes Hassell Done, Mary-Margaret, FNP  omeprazole (PRILOSEC) 20 MG capsule Take 1 capsule (20 mg total) by mouth in the morning and at bedtime. Patient taking differently: Take 20 mg by mouth 2 (two) times daily before a meal.  12/10/19  Yes Esterwood, Amy S, PA-C  pravastatin (PRAVACHOL) 80 MG tablet Take 1 tablet (80 mg total) by mouth daily. 10/19/19  Yes Minus Breeding, MD  sildenafil (VIAGRA) 100 MG tablet Take 0.5-1 tablets (50-100 mg total) by mouth daily as needed for erectile dysfunction. 07/04/15  Yes Martin, Mary-Margaret, FNP  benzonatate (TESSALON PERLES) 100 MG capsule Take 1 capsule (100 mg total) by mouth 3 (three) times daily as needed for cough. Patient not taking: Reported on 01/03/2020 10/07/19   [name], FNP  cyclobenzaprine (FLEXERIL) 10 MG tablet Take 1 tablet (10 mg total) by mouth 3 (three) times daily as needed for muscle spasms. Patient not taking: Reported on 01/03/2020 03/13/18   [name], FNP  ibuprofen (IBU) 800 MG tablet TAKE ONE TABLET BY MOUTH 3 TIMES DAILY AS NEEDED Patient not taking: Reported on 01/03/2020 10/14/17   [name], FNP    Allergies    Ace inhibitors and Other  Review of Systems   Review of Systems  Constitutional: Negative for chills, diaphoresis, fatigue and fever.  HENT: Negative for  congestion, ear pain, sinus pressure, sinus pain and sore throat.   Eyes: Positive for visual disturbance ("white stars in his vision"). Negative for pain.  Respiratory: Positive for cough and choking. Negative for shortness of breath, wheezing and stridor.   Cardiovascular: Negative for chest pain and palpitations.  Gastrointestinal: Negative for abdominal pain, blood in stool,  diarrhea and vomiting.  Genitourinary: Negative for dysuria, hematuria and urgency.  Musculoskeletal: Positive for back pain. Negative for arthralgias, gait problem, joint swelling, myalgias, neck pain and neck stiffness.  Skin: Positive for wound. Negative for color change and rash.  Neurological: Positive for dizziness, tremors (left arm), syncope, light-headedness and headaches (today only). Negative for seizures, speech difficulty, weakness and numbness.  Psychiatric/Behavioral: Negative for confusion.  All other systems reviewed and are negative.   Physical Exam Updated Vital Signs BP 96/60   Pulse (!) 56   Temp 98.3 F (36.8 C) (Oral)   Resp 19   SpO2 92%   Physical Exam Vitals and nursing note reviewed.  Constitutional:      General: He is not in acute distress.    Appearance: He is well-developed. He is not ill-appearing, toxic-appearing or diaphoretic.  HENT:     Head: Normocephalic and atraumatic.     Right Ear: Ear canal normal. There is no impacted cerumen.     Left Ear: Ear canal normal. There is no impacted cerumen.     Ears:     Comments: Effusion noted to the left TM.  Normal exam of the right TM.  No mastoid tenderness bilaterally.  No cerumen impaction.    Nose: Nose normal.     Mouth/Throat:     Mouth: Mucous membranes are moist.     Pharynx: No oropharyngeal exudate or posterior oropharyngeal erythema.  Eyes:     General: No scleral icterus.    Extraocular Movements: Extraocular movements intact.     Conjunctiva/sclera: Conjunctivae normal.  Cardiovascular:     Rate and Rhythm:  Normal rate and regular rhythm.     Pulses: Normal pulses.     Heart sounds: Normal heart sounds. No murmur. No friction rub. No gallop.   Pulmonary:     Effort: Pulmonary effort is normal. No respiratory distress.     Breath sounds: Normal breath sounds. No stridor. No wheezing, rhonchi or rales.     Comments: Lungs are clear to auscultation bilaterally. Chest:     Chest wall: No tenderness.  Abdominal:     General: There is no distension.     Palpations: Abdomen is soft. There is no mass.     Tenderness: There is no abdominal tenderness. There is no right CVA tenderness, left CVA tenderness, guarding or rebound.     Hernia: No hernia is present.     Comments: Abdomen is soft, nontender.  Musculoskeletal:     Cervical back: Normal range of motion and neck supple. No rigidity or tenderness.     Comments: No tenderness to palpation to the cervical or thoracic spinous processes or bilateral paraspinal muscles.  There is midline tenderness to the spinous processes of the lumbar spine, with maximal tenderness around L5.  No crepitus or step-offs.  Tender to palpation to the left elbow, forearm, left upper arm, and left shoulder.  Pain with range of motion of the left elbow.  Full active and passive range of motion without pain to the left wrist.  Full active and passive range of motion of the shoulder with minimal pain.  Mild swelling is noted to the right knee.  No tenderness to the medial or lateral joint line.  No tenderness of the patella.  Full active and passive range of motion.  There is stiffness with range of motion.  Normal exam of all other joints of the bilateral lower extremities.  Lymphadenopathy:     Cervical: No cervical adenopathy.  Skin:    General: Skin is warm and dry.     Capillary Refill: Capillary refill takes less than 2 seconds.     Coloration: Skin is not pale.     Findings: Rash present. No erythema.     Comments: Multiple mildly oozing skin tears are noted to the  left forearm.  There appears to be a tophus noted to the left elbow.  There is a light maculopapular rash noted to the skin around to the bilateral heels of the feet.  Neurological:     Mental Status: He is alert.     Comments: Alert and oriented x4.  Cranial nerves II through XII are grossly intact.  Sensation is intact and equal to the bilateral upper and lower extremities.  5-5 strength against resistance of the bilateral upper and lower extremities.  No clonus bilaterally.  Gait is not ataxic.  Negative Romberg.  No pronator drift.  No dysmetria with finger-to-nose bilaterally.  Normal heel-to-shin.  Normal rapid alternating movements.  Psychiatric:        Behavior: Behavior normal.     ED Results / Procedures / Treatments   Labs (all labs ordered are listed, but only abnormal results are displayed) Labs Reviewed  CBC - Abnormal; Notable for the following components:      Result Value   Platelets 451 (*)    All other components within normal limits  BASIC METABOLIC PANEL  URINALYSIS, ROUTINE W REFLEX MICROSCOPIC  CBG MONITORING, ED  TROPONIN I (HIGH SENSITIVITY)    EKG EKG Interpretation  Date/Time:  Sunday January 02 2020 23:04:41 EDT Ventricular Rate:  68 PR Interval:  204 QRS Duration: 96 QT Interval:  406 QTC Calculation: 431 R Axis:   56 Text Interpretation: Sinus rhythm with Premature atrial complexes in a pattern of bigeminy Otherwise normal ECG When compared with ECG of 05/26/2017, Premature atrial complexes are now present Confirmed by Glick, David ([id number]) on 01/02/2020 11:39:51 PM   Radiology No results found.  Procedures Procedures (including critical care time)  Medications Ordered in ED Medications  sodium chloride flush (NS) 0.9 % injection 3 mL (has no administration in time range)  Tdap (BOOSTRIX) injection 0.5 mL (has no administration in time range)    ED Course  I have reviewed the triage vital signs and the nursing notes.  Pertinent labs &  imaging results that were available during my care of the patient were reviewed by me and considered in my medical decision making (see chart for details).    MDM Rules/Calculators/A&P                      63  year old male with history of CAD s/p CAB x 4, HTN, hyperlipidemia, gout, generalized anxiety disorder, panic attacks, tobacco use disorder, and alcohol use disorder who has been having episodes of cough syncope over the last 4 months that have been increasing in frequency over the last month.  He describes these episodes as having a severe coughing fit, feeling as if he is choking, becoming dizzy and lightheaded, seeing white stars in his vision, and then noticing a tremor of his left upper arm.  He has been previously evaluated by primary care, cardiology, has been seen initially by GI, and has a pending referral to ENT.  His primary concern that has episodes are becoming more frequent, he is starting to have more injuries from the episodes.  Most recently, he fell onto his left arm.  He also notes that he  fell backwards and hit his head, not related to a syncopal episode, over the weekend.  Will order CT imaging of the head and cervical spine as well as imaging of his left upper arm.  He has no electrolyte derangements.  CBC is unremarkable.  Hemoglobin is stable.  Will add on troponin and EKG.   Given the tremor of the left arm, this can be seen with syncope, but could also consider that these episodes may be seizures.  The son does note that when he witnessed an episode several months ago that the patient had shaking and jerking of the bilateral arms and legs.  I reviewed the patient's record from his outpatient care, and it is mentioned that other episodes have been observed by family and there did not appear to be any instances of seizure-like activity.  It may be beneficial to obtain a neurology consult while the patient is being evaluated in the ER today.  Patient care transferred to [place] at the end of my shift to follow-up on pending labs and imaging studies and reevaluate the patient. Patient presentation, ED course, and plan of care discussed with review of all pertinent labs and imaging. Please see his/her note for further details regarding further ED course and disposition.   Final Clinical Impression(s) / ED Diagnoses Final diagnoses:  None    Rx / DC Orders ED Discharge Orders    None       Joanne Gavel, PA-C 01/03/20 0826    Ripley Fraise, MD 01/04/20 [phone]

## 2020-01-03 NOTE — H&P (Addendum)
History and Physical    Eric Saunders [id number] DOB: 05-18-1956 DOA: 01/02/2020  PCP: [name], FNP Consultants:  Nelva Bush - orthopedics; Hochrein - cardiology; Mansouraty - GI Patient coming from:  Home - lives with wife; NOK: Son or daughter, [phone]  Chief Complaint: Syncope  HPI: Eric Saunders is a 64 y.o. male with medical history significant of HTN; HLD; panic disorder; and CAD presenting with syncope.  In the last 5 weeks, he has passed out 5 times from coughing.  He had one episode yesterday, the prior on Friday.  When he falls down, if he isn't sitting it tears up his arms (lacs).  It seems like it happens  A lot on Sundays.  It is not his usual cough, but he starts coughing and it makes him pass out.  It happened on 3/21 and he was sitting in his chair and when he woke up the ringing in his ears was super loud and has not stopped since.  His son has seen it one time and his whole body was shaking for a few seconds and it was resolved when EMS arrived.  EMS didn't think it looked like a seizure and did not transport him (that was in December and he followed up on Monday).  He has been to GI and cardiology for evaluation about these issues.  He had a normal esophogram and is being sent to ENT.  He has a chronic cough and continues to smoke.  His cough before these episodes is "like I'm choking on phlegm I'm choking so hard" and he sees stars.  He feels exhausted afterwards but is not confused.  The episode lasts maybe 30 seconds and the exhaustion persists for hours afterward.  He has not bitten his tongue in an episode, although he has never bitten his lip.  He has not had urinary incontinence but did have fecal incontinence one time.  He notices his left arm twitches at times when this happens but not all the time.  He takes Xanax as needed for panic attacks.  He took a Xanax after the last episode.  He has not worked in 2 years due to a bad accident on the job - has  significant R-sided MSK injuries and a pneumothorax.  He has chronic LBP as a result.  His elbow does not really hurt unless he hits it on something.  Chronic LLL medial numbness.    ED Course:  Syncope vs. Seizures.  Calling it progressive, recurrent syncope - 5 in the last month - cough, feels like choking, LUE tremors, loses consciousness.  Neurology consulted.  GI, Cardiology have seen.  MRI and EEG ordered.  Has left ulnar fracture from fall associated with one of these episodes - ?septic bursitis, may need to go to OR, started on Ancef.  Review of Systems: As per HPI; otherwise review of systems reviewed and negative.   Ambulatory Status:  Ambulates without assistance  COVID Vaccine Status:  None  Past Medical History:  Diagnosis Date  . Coronary artery disease   . GERD (gastroesophageal reflux disease)   . Hyperlipemia   . Hypertension   . Panic disorder   . Tobacco abuse     Past Surgical History:  Procedure Laterality Date  . CORONARY ARTERY BYPASS GRAFT N/A 06/17/2014   Procedure: CORONARY ARTERY BYPASS GRAFTING (CABG) X 4, RIGHT LEG EVH;  Surgeon: Gaye Pollack, MD;  Location: [facility] OR;  Service: Open Heart Surgery;  Laterality: N/A;  . KNEE  SURGERY     bilateral arthroscopic  . LEFT HEART CATHETERIZATION WITH CORONARY ANGIOGRAM N/A 06/16/2014   Procedure: LEFT HEART CATHETERIZATION WITH CORONARY ANGIOGRAM;  Surgeon: Burnell Blanks, MD;  Location: [facility] CATH LAB;  Service: Cardiovascular;  Laterality: N/A;  . TEE WITHOUT CARDIOVERSION N/A 06/17/2014   Procedure: TRANSESOPHAGEAL ECHOCARDIOGRAM (TEE);  Surgeon: Gaye Pollack, MD;  Location: [facility];  Service: Open Heart Surgery;  Laterality: N/A;    Social History   Socioeconomic History  . Marital status: Married    Spouse name: Not on file  . Number of children: 2  . Years of education: Not on file  . Highest education level: Not on file  Occupational History  . Occupation: applying for disability  Tobacco Use    . Smoking status: Former Smoker    Packs/day: 1.50    Years: 30.00    Pack years: 45.00    Types: Cigarettes    Quit date: 1951    Years since quitting: 70.3  . Smokeless tobacco: Never Used  Substance and Sexual Activity  . Alcohol use: Yes    Alcohol/week: 0.0 standard drinks    Comment: binge drinking - 6 yesterday, but "I drink very  little"  . Drug use: No  . Sexual activity: Not on file  Other Topics Concern  . Not on file  Social History Narrative  . Not on file   Social Determinants of Health   Financial Resource Strain:   . Difficulty of Paying Living Expenses:   Food Insecurity:   . Worried About Charity fundraiser in the Last Year:   . Arboriculturist in the Last Year:   Transportation Needs:   . Film/video editor (Medical):   Marland Kitchen Lack of Transportation (Non-Medical):   Physical Activity:   . Days of Exercise per Week:   . Minutes of Exercise per Session:   Stress:   . Feeling of Stress :   Social Connections:   . Frequency of Communication with Friends and Family:   . Frequency of Social Gatherings with Friends and Family:   . Attends Religious Services:   . Active Member of Clubs or Organizations:   . Attends Archivist Meetings:   Marland Kitchen Marital Status:   Intimate Partner Violence:   . Fear of Current or Ex-Partner:   . Emotionally Abused:   Marland Kitchen Physically Abused:   . Sexually Abused:     Allergies  Allergen Reactions  . Ace Inhibitors Cough  . Other     Family History  Problem Relation Age of Onset  . Drug abuse Sister   . Healthy Brother   . Diabetes Sister   . Healthy Sister   . Healthy Sister   . Healthy Sister   . Healthy Sister   . Healthy Brother     Prior to Admission medications   Medication Sig Start Date End Date Taking? Authorizing Provider  ALPRAZolam Duanne Moron) 0.5 MG tablet Take 1 tablet (0.5 mg total) by mouth 2 (two) times daily as needed for anxiety. 10/07/19  Yes Martin, Mary-Margaret, FNP  amLODipine (NORVASC)  10 MG tablet Take 1 tablet (10 mg total) by mouth daily. 10/07/19  Yes Hassell Done, Mary-Margaret, FNP  aspirin 81 MG tablet Take 81 mg by mouth daily.   Yes [provider]  escitalopram (LEXAPRO) 10 MG tablet Take 1 tablet (10 mg total) by mouth daily. 10/07/19  Yes Hassell Done, Mary-Margaret, FNP  fenofibrate 160 MG tablet Take 1 tablet (160  mg total) by mouth daily. 10/07/19  Yes Martin, Mary-Margaret, FNP  fluticasone furoate-vilanterol (BREO ELLIPTA) 100-25 MCG/INH AEPB Inhale 1 puff into the lungs every morning. 10/07/19  Yes Hassell Done, Mary-Margaret, FNP  hydrochlorothiazide (HYDRODIURIL) 25 MG tablet Take 1 tablet (25 mg total) by mouth daily. 10/07/19  Yes Martin, Mary-Margaret, FNP  losartan (COZAAR) 100 MG tablet Take 1 tablet (100 mg total) by mouth daily. 10/07/19  Yes Hassell Done, Mary-Margaret, FNP  metoprolol succinate (TOPROL-XL) 25 MG 24 hr tablet Take 1 tablet (25 mg total) by mouth daily. 10/07/19  Yes Hassell Done, Mary-Margaret, FNP  omeprazole (PRILOSEC) 20 MG capsule Take 1 capsule (20 mg total) by mouth in the morning and at bedtime. Patient taking differently: Take 20 mg by mouth 2 (two) times daily before a meal.  12/10/19  Yes Esterwood, Amy S, PA-C  pravastatin (PRAVACHOL) 80 MG tablet Take 1 tablet (80 mg total) by mouth daily. 10/19/19  Yes Minus Breeding, MD  sildenafil (VIAGRA) 100 MG tablet Take 0.5-1 tablets (50-100 mg total) by mouth daily as needed for erectile dysfunction. 07/04/15  Yes Martin, Mary-Margaret, FNP  benzonatate (TESSALON PERLES) 100 MG capsule Take 1 capsule (100 mg total) by mouth 3 (three) times daily as needed for cough. Patient not taking: Reported on 01/03/2020 10/07/19   [name], FNP  cyclobenzaprine (FLEXERIL) 10 MG tablet Take 1 tablet (10 mg total) by mouth 3 (three) times daily as needed for muscle spasms. Patient not taking: Reported on 01/03/2020 03/13/18   [name], FNP  ibuprofen (IBU) 800 MG tablet TAKE ONE TABLET BY MOUTH 3 TIMES  DAILY AS NEEDED Patient not taking: Reported on 01/03/2020 10/14/17   [name], FNP    Physical Exam: Vitals:   01/03/20 1630 01/03/20 1715 01/03/20 1730 01/03/20 1745  BP: 140/73 139/85 (!) 144/83 135/77  Pulse: 64 61 (!) 59 62  Resp: 18 19 18 18   Temp:      TempSrc:      SpO2: 95% 93% 90% 90%     . General:  Appears calm and comfortable and is NAD; very conversant . Eyes:  PERRL, EOMI, normal lids, iris . ENT:  grossly normal hearing, lips & tongue, mmm; appropriate dentition . Neck:  no LAD, masses or thyromegaly; no carotid bruits . Cardiovascular:  RRR, no m/r/g. No LE edema.  Marland Kitchen Respiratory:   CTA bilaterally with no wheezes/rales/rhonchi.  Normal respiratory effort. . Abdomen:  soft, NT, ND, NABS . Skin:  no rash or induration seen on limited exam . Musculoskeletal:  grossly normal tone BUE/BLE, good ROM, no bony abnormality . Lower extremity:  No LE edema.  Limited foot exam with no ulcerations.  2+ distal pulses. Marland Kitchen Psychiatric:  grossly normal mood and affect, speech fluent and appropriate, AOx3 . Neurologic:  CN 2-12 grossly intact, moves all extremities in coordinated fashion    Radiological Exams on Admission: DG Lumbar Spine Complete  Result Date: 01/03/2020 CLINICAL DATA:  Chronic low back pain. Multiple syncopal episodes. EXAM: LUMBAR SPINE - COMPLETE 4+ VIEW COMPARISON:  Lumbar spine MR dated 08/19/2019 FINDINGS: Transitional L1 vertebra. The hypointense lesion seen in the L1 vertebral body on the previous MRI is sclerotic on the radiographs today. This measures 2 cm in maximum diameter. Large anterior spurs at the T12-L1 level. Additional lateral and anterior spurs at multiple levels of the lumbar and lower thoracic spine. Mid and lower lumbar spine facet degenerative changes. No fractures, pars defects or subluxations. Atheromatous arterial calcifications without visible aneurysm. IMPRESSION: 1. 2  cm nonspecific sclerotic lesion in the L1 vertebral body.  This could represent a sclerotic metastasis or large bone island. This could be further assessed with a radionuclide bone scan. 2. Multilevel degenerative changes. 3. No fracture or subluxation. 4. Aortic atherosclerosis. Electronically Signed   By: Claudie Revering M.D.   On: 01/03/2020 08:32   DG Elbow Complete Left  Result Date: 01/03/2020 CLINICAL DATA:  Left elbow pain. Multiple recent syncopal episodes. EXAM: LEFT ELBOW - COMPLETE 3+ VIEW COMPARISON:  Right humerus and forearm radiographs obtained at the same time. FINDINGS: Previously described nondisplaced olecranon and olecranon enthesophyte fractures with associated posterior soft tissue swelling. No additional fractures and no dislocations. No effusion seen. IMPRESSION: Previously described nondisplaced olecranon and olecranon enthesophyte fractures with associated posterior soft tissue swelling. Electronically Signed   By: Claudie Revering M.D.   On: 01/03/2020 08:37   DG Forearm Left  Result Date: 01/03/2020 CLINICAL DATA:  Left forearm pain following multiple recent syncopal episodes. EXAM: LEFT FOREARM - 2 VIEW COMPARISON:  Left humerus radiographs obtained at the same time. FINDINGS: Previously described nondisplaced olecranon fracture and nondisplaced olecranon enthesophyte fracture with associated posterior soft tissue swelling. No additional fractures or dislocations seen. A corticated ossicle is noted distal to the ulnar styloid. IMPRESSION: Previously described nondisplaced olecranon fracture and nondisplaced olecranon enthesophyte fracture with associated posterior soft tissue swelling. Electronically Signed   By: Claudie Revering M.D.   On: 01/03/2020 08:35   CT Head Wo Contrast  Result Date: 01/03/2020 CLINICAL DATA:  Headache and neck pain following a syncopal episode. Two additional recent syncopal episodes. EXAM: CT HEAD WITHOUT CONTRAST CT CERVICAL SPINE WITHOUT CONTRAST TECHNIQUE: Multidetector CT imaging of the head and cervical spine  was performed following the standard protocol without intravenous contrast. Multiplanar CT image reconstructions of the cervical spine were also generated. COMPARISON:  Head CT dated 05/27/2017 FINDINGS: CT HEAD FINDINGS Brain: Normal appearing cerebral hemispheres and posterior fossa structures. Normal size and position of the ventricles. No intracranial hemorrhage, mass lesion or CT evidence of acute infarction. Vascular: No hyperdense vessel or unexpected calcification. Skull: Normal. Negative for fracture or focal lesion. Sinuses/Orbits: Bilateral sphenoid and maxillary sinus retention cysts. Two symmetrical areas of calcification or postsurgical changes in the posterior aspect of each globe, possibly due to previous retinal detachment repairs. Other: None. CT CERVICAL SPINE FINDINGS Alignment: Normal. Skull base and vertebrae: No acute fracture. No primary bone lesion or focal pathologic process. Soft tissues and spinal canal: No prevertebral fluid or swelling. No visible canal hematoma. Disc levels:  Multilevel degenerative changes. Upper chest: Clear lung apices. 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 Other: Bilateral carotid artery calcifications. IMPRESSION: 1. No skull fracture or intracranial hemorrhage. 2. No cervical spine fracture or subluxation. 3. Multilevel cervical spine degenerative changes. 4. Bilateral carotid artery atheromatous calcifications. Electronically Signed   By: Claudie Revering M.D.   On: 01/03/2020 08:14   CT Cervical Spine Wo Contrast  Result Date: 01/03/2020 CLINICAL DATA:  Headache and neck pain following a syncopal episode. Two additional recent syncopal episodes. EXAM: CT HEAD WITHOUT CONTRAST CT CERVICAL SPINE WITHOUT CONTRAST TECHNIQUE: Multidetector CT imaging of the head and cervical spine was performed following the standard protocol without intravenous contrast. Multiplanar CT image reconstructions of the cervical spine were also generated.  COMPARISON:  Head CT dated 05/27/2017 FINDINGS: CT HEAD FINDINGS Brain: Normal appearing cerebral hemispheres and posterior fossa structures. Normal size and position of the ventricles. No intracranial hemorrhage, mass lesion or  CT evidence of acute infarction. Vascular: No hyperdense vessel or unexpected calcification. Skull: Normal. Negative for fracture or focal lesion. Sinuses/Orbits: Bilateral sphenoid and maxillary sinus retention cysts. Two symmetrical areas of calcification or postsurgical changes in the posterior aspect of each globe, possibly due to previous retinal detachment repairs. Other: None. CT CERVICAL SPINE FINDINGS Alignment: Normal. Skull base and vertebrae: No acute fracture. No primary bone lesion or focal pathologic process. Soft tissues and spinal canal: No prevertebral fluid or swelling. No visible canal hematoma. Disc levels:  Multilevel degenerative changes. Upper chest: Clear lung apices. 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 Other: Bilateral carotid artery calcifications. IMPRESSION: 1. No skull fracture or intracranial hemorrhage. 2. No cervical spine fracture or subluxation. 3. Multilevel cervical spine degenerative changes. 4. Bilateral carotid artery atheromatous calcifications. Electronically Signed   By: Claudie Revering M.D.   On: 01/03/2020 08:14   MR Brain W and Wo Contrast  Result Date: 01/03/2020 CLINICAL DATA:  Recurrent syncope versus seizure activity, patient coughs, see stars, tremulous activity in left upper extremity prior to passing out. EXAM: MRI HEAD WITHOUT AND WITH CONTRAST TECHNIQUE: Multiplanar, multiecho pulse sequences of the brain and surrounding structures were obtained without and with intravenous contrast. CONTRAST:  57mL GADAVIST GADOBUTROL 1 MMOL/ML IV SOLN COMPARISON:  CT head 01/03/2020, CT head 12/25/2016. FINDINGS: Brain: Mild intermittent motion degradation. There is no evidence of acute infarct. No evidence of  intracranial mass. No midline shift or extra-axial fluid collection. There are few nonspecific punctate foci of chronic microhemorrhage within the bilateral cerebral hemispheres. Otherwise, no focal parenchymal signal abnormality is identified. No abnormal intracranial enhancement is demonstrated. Cerebral volume is normal for age. Vascular: Flow voids maintained within the proximal large arterial vessels. Skull and upper cervical spine: No focal marrow lesion. Sinuses/Orbits: Visualized orbits demonstrate no acute abnormality. Small mucous retention cysts within the right sphenoid and bilateral maxillary sinuses. No significant mastoid effusion. IMPRESSION: Mildly motion degraded examination. No evidence of acute intracranial abnormality. There are few nonspecific punctate microhemorrhages within bilateral cerebral hemispheres. Small mucous retention cysts within the right sphenoid and bilateral maxillary sinuses. Electronically Signed   By: Kellie Simmering DO   On: 01/03/2020 12:04   DG Humerus Left  Result Date: 01/03/2020 CLINICAL DATA:  Left arm pain following recent syncopal episodes. EXAM: LEFT HUMERUS - 2+ VIEW COMPARISON:  None. FINDINGS: Mild hyperostosis of the superior aspect of the acromion. No humeral fracture or dislocation seen. Large olecranon enthesophyte with a fracture in the proximal portion of the enthesophyte. There is also a nondisplaced fracture proximal, anterior aspect of the olecranon. Posterior soft tissue swelling is noted centered at the elbow, most pronounced overlying the olecranon enthesophyte. IMPRESSION: 1. Nondisplaced fracture in the proximal, anterior aspect of the olecranon. 2. Large olecranon enthesophyte with a fracture in the proximal portion of the enthesophyte. 3. Posterior soft tissue swelling centered at the level of the elbow. Electronically Signed   By: Claudie Revering M.D.   On: 01/03/2020 08:34    EKG: Independently reviewed.  NSR with rate 68; PACs with no  evidence of acute ischemia   Labs on Admission: I have personally reviewed the available labs and imaging studies at the time of the admission.  Pertinent labs:   Normal BMP WBC 7.9 Platelets 451 HS troponin 5, 5 UA WNL   Assessment/Plan Principal Problem:   Syncope Active Problems:   GAD (generalized anxiety disorder)   Tobacco abuse   Obesity (BMI 30.0-34.9)   Essential  hypertension   Olecranon fracture, left, closed, initial encounter   Olecranon bursitis of left elbow   COPD (chronic obstructive pulmonary disease) (HCC)   Syncope -Each episode appears to be triggered by cough. -Etiology is not clear. The differential diagnosis is broad, including vasovagal syncope, seizure, TIA/stroke, arrhythmia, ACS, alcohol intoxication, drug abuse, orthostatic status, carotid artery stenosis, panic -Due to recurrent episodes, will observe in the hospital. -Will monitor on telemetry -Orthostatic vital signs now and in AM -Carotid doppler, MRI-brain -2d echo -EEG -Neuro checks  -check A1c, FLP, TSH, UDS -Continue ASA -PT/OT eval and treat -Already has PCP appt scheduled for Thursday  L elbow fracture/olecranon bursitis -Seen by ortho, no need for splinting and WBAT for fracture -Joint aspiration in ER -Continue Ancef for now, transition to PO antibiotics prior to d/c -If not improving, consider I&D with bursectomy; otherwise, ortho outpatient f/u  HTN -Hold BP meds due to low BP in ER, likely ok to resume in AM -Home meds include Norvasc, HCTZ, Cozaar, and Toprol -Toprol was continued in AM already  Anxiety -Patient reports having panic attacks but these episodes are different than his usual panic -Continue Lexapro  COPD with ongoing tobacco dependence -Patient with long-standing tobacco history -Chronic cough appears to be due to COPD -Cough may be irritating his vagus nerve and causing episodes -Continue Breo and add prn Albuterol -Will add Mucinex for cough  suppression -Tobacco Dependence: encourage cessation; this was discussed with the patient and should be reviewed on an ongoing basis.   -Patch ordered  Obesity -BMI 30.9 -Weight loss should be encouraged -Outpatient PCP/bariatric medicine encouraged    Note: This patient has been tested and is negative for the novel coronavirus COVID-19.  DVT prophylaxis:   Lovenox  Code Status:  Full - confirmed with patient/family Family Communication: Son was present throughout evaluation. Disposition Plan:  The patient is from: home  Anticipated d/c is to: home without [facility] services  Anticipated d/c date will depend on clinical response to treatment, but possibly as early as tomorrow  Patient is currently: medically stable Consults called: Neuro; orthopedics; PT/OT Admission status:  It is my clinical opinion that referral for OBSERVATION is reasonable and necessary in this patient based on the above information provided. The aforementioned taken together are felt to place the patient at high risk for further clinical deterioration. However it is anticipated that the patient may be medically stable for discharge from the hospital within 24 to 48 hours.    Karmen Bongo MD [facility]   How to contact the [facility] Attending or Consulting provider [place] or covering provider during after hours [place], for this patient?  1. Check the care team in [facility] and look for a) attending/consulting [facility] provider listed and b) the [facility] team listed 2. Log into [email or web address] and use [place] universal password to access. If you do not have the password, please contact the hospital operator. 3. Locate the [facility] provider you are looking for under [facility] and page to a number that you can be directly reached. 4. If you still have difficulty reaching the provider, please page the [facility] (Director on Call) for the Hospitalists listed on amion for assistance.   01/03/2020, 6:27 PM

## 2020-01-03 NOTE — Progress Notes (Signed)
New Admission Note:   Arrival Method:  Stretcher  Mental Orientation: Alert and Oriented  Telemetry: [address] 17  Assessment: Completed Skin: bruising on arm  IV: Right hand  Pain: 0/10  Tubes: none  Safety Measures: Safety Fall Prevention Plan has been given, discussed and signed Admission: Completed [facility] [place] Orientation: Patient has been orientated to the room, unit and staff.  Family: none   Orders have been reviewed and implemented. Will continue to monitor the patient. Call light has been placed within reach and bed alarm has been activated.   Quentyn Kolbeck RN [place] [facility] Phone: [phone]

## 2020-01-03 NOTE — ED Provider Notes (Signed)
07:00: Assumed care of patient from [place] PA-C at change of shift pending UA, imaging, & likely consult to neurology.   Please see prior provider note for full H&P.  Briefly patient is a 64 year old with a history of hypertension, hyperlipidemia, & CAD who presents to the ED with complaints of recurrent LOC. Patient has had recurrent LOC episodes for the past 5 months. Each episode seems to be fairly similar that he begins to cough aggressively, feels like he is choking, sees stars, and develops tremulous activity in the LUE with subsequent syncopal episode. These are typically unwitnessed, his son witnessed 1 episode and there was concern for generalized shaking/jerking at that time, his daughter witnessed one and reported LUE shaking. These are occurring more frequently and he has been injuring himself which prompted ED visit. Has had 5 in the past 1 month. Has been seen by cardiology & GI outpatient for these episodes.   Physical Exam  BP (!) 149/104 (BP Location: Right Arm)   Pulse 62   Temp 98.3 F (36.8 C) (Oral)   Resp 16   SpO2 98%   Physical Exam Vitals and nursing note reviewed.  Constitutional:      General: He is not in acute distress.    Appearance: He is well-developed.  HENT:     Head: Normocephalic.  Eyes:     General:        Right eye: No discharge.        Left eye: No discharge.     Conjunctiva/sclera: Conjunctivae normal.  Cardiovascular:     Rate and Rhythm: Normal rate.     Comments: 2+ symmetric radial pulses. Pulmonary:     Effort: Pulmonary effort is normal.  Musculoskeletal:     Comments: Patient has multiple skin tears/abrasions to the left upper extremity.  There is an area of erythema and increased swelling with a central wound to the left olecranon bursa region.  It is tender to palpation.  No expressible purulent drainage.  Intact range of motion throughout. neurovascularly intact distally.  Neurological:     Mental Status: He is alert.   Comments: Clear speech.  Sensation grossly intact bilateral upper and lower extremities.  5 out of 5 symmetric grip strength.  5 out of 5 strength with plantar dorsiflexion bilaterally.  Psychiatric:        Behavior: Behavior normal.        Thought Content: Thought content normal.       ED Course/Procedures     Aspiration of blood/fluid  Date/Time: 01/03/2020 12:45 PM Performed by: Amaryllis Dyke, PA-C Authorized by: Amaryllis Dyke, PA-C  Consent: Verbal consent obtained. Risks and benefits: risks, benefits and alternatives were discussed Consent given by: patient Patient understanding: patient states understanding of the procedure being performed Patient consent: the patient's understanding of the procedure matches consent given Procedure consent: procedure consent matches procedure scheduled Relevant documents: relevant documents present and verified Test results: test results available and properly labeled Site marked: the operative site was marked Imaging studies: imaging studies available Required items: required blood products, implants, devices, and special equipment available Patient identity confirmed: verbally with patient Time out: Immediately prior to procedure a "time out" was called to verify the correct patient, procedure, equipment, support staff and site/side marked as required. Preparation: Patient was prepped and draped in the usual sterile fashion. Local anesthesia used: yes Anesthesia: local infiltration  Anesthesia: Local anesthesia used: yes Local Anesthetic: lidocaine 1% without epinephrine Anesthetic total: 2  mL  Sedation: Patient sedated: no  Patient tolerance: patient tolerated the procedure well with no immediate complications     Results for orders placed or performed during the hospital encounter of [id number]  Basic metabolic panel  Result Value Ref Range   Sodium 137 135 - 145 mmol/L   Potassium 3.6 3.5 - 5.1 mmol/L    Chloride 101 98 - 111 mmol/L   CO2 22 22 - 32 mmol/L   Glucose, Bld 81 70 - 99 mg/dL   BUN 21 8 - 23 mg/dL   Creatinine, Ser 1.05 0.61 - 1.24 mg/dL   Calcium 9.2 8.9 - 10.3 mg/dL   GFR calc non Af Amer >60 >60 mL/min   GFR calc Af Amer >60 >60 mL/min   Anion gap 14 5 - 15  CBC  Result Value Ref Range   WBC 7.9 4.0 - 10.5 K/uL   RBC 5.40 4.22 - 5.81 MIL/uL   Hemoglobin 15.8 13.0 - 17.0 g/dL   HCT 49.0 39.0 - 52.0 %   MCV 90.7 80.0 - 100.0 fL   MCH 29.3 26.0 - 34.0 pg   MCHC 32.2 30.0 - 36.0 g/dL   RDW 13.2 11.5 - 15.5 %   Platelets 451 (H) 150 - 400 K/uL   nRBC 0.0 0.0 - 0.2 %  CBG monitoring, ED  Result Value Ref Range   Glucose-Capillary 74 70 - 99 mg/dL   Comment 1 Notify RN    Comment 2 Document in Chart    DG ESOPHAGUS W DOUBLE CM (HD)  Result Date: 12/15/2019 CLINICAL DATA:  Recurrent choking episodes. Syncope. EXAM: ESOPHOGRAM / BARIUM SWALLOW / BARIUM TABLET STUDY TECHNIQUE: Combined double contrast and single contrast examination performed using effervescent crystals, thick barium liquid, and thin barium liquid. The patient was observed with fluoroscopy swallowing a 13 mm barium sulphate tablet. FLUOROSCOPY TIME:  Fluoroscopy Time: 1 minutes and 24 seconds of low-dose pulsed fluoroscopy Radiation Exposure Index (if provided by the fluoroscopic device): 14.6 mGy Number of Acquired Spot Images: 0 COMPARISON:  Chest CT 10/19/2019 FINDINGS: The patient swallowed the barium without difficulty. Rapid sequence imaging of the pharynx in the AP and lateral projections demonstrates no mucosal abnormalities or laryngeal penetration. 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 No esophageal ulceration, mass lesion or stricture identified. There is a small reducible hiatal hernia associated with mild gastroesophageal reflux with the water siphon test. A 13 mm barium tablet was administered and passed  with minimal delay into the stomach. IMPRESSION: 1. Small reducible hiatal hernia with mild gastroesophageal reflux. 2. No evidence of esophageal stricture, ulceration or stricture. 3. Aberrant right subclavian artery noted on prior CT without significant mass effect on the esophagus. Electronically Signed   By: Richardean Sale M.D.   On: 12/15/2019 10:18    Imaging not crossing over in computer system.  EXAM: LEFT HUMERUS - 2+ VIEW  COMPARISON: None.  FINDINGS: Mild hyperostosis of the superior aspect of the acromion. No humeral fracture or dislocation seen. Large olecranon enthesophyte with a fracture in the proximal portion of the enthesophyte. There is also a nondisplaced fracture proximal, anterior aspect of the olecranon. Posterior soft tissue swelling is noted centered at the elbow, most pronounced overlying the olecranon enthesophyte.  IMPRESSION: 1. Nondisplaced fracture in the proximal, anterior aspect of the olecranon. 2. Large olecranon enthesophyte with a fracture in the proximal portion of the enthesophyte. 3. Posterior soft tissue swelling  centered at the level of the elbow.   Electronically Signed By: Claudie Revering M.D. On: 01/03/2020 08:34  EXAM: LEFT ELBOW - COMPLETE 3+ VIEW  COMPARISON: Right humerus and forearm radiographs obtained at the same time.  FINDINGS: Previously described nondisplaced olecranon and olecranon enthesophyte fractures with associated posterior soft tissue swelling. No additional fractures and no dislocations. No effusion seen.  IMPRESSION: Previously described nondisplaced olecranon and olecranon enthesophyte fractures with associated posterior soft tissue swelling.   Electronically Signed By: Claudie Revering M.D. On: 01/03/2020 08:37  EXAM: LEFT FOREARM - 2 VIEW  COMPARISON: Left humerus radiographs obtained at the same time.  FINDINGS: Previously described nondisplaced olecranon fracture and nondisplaced olecranon  enthesophyte fracture with associated posterior soft tissue swelling. No additional fractures or dislocations seen. A corticated ossicle is noted distal to the ulnar styloid.  IMPRESSION: Previously described nondisplaced olecranon fracture and nondisplaced olecranon enthesophyte fracture with associated posterior soft tissue swelling.   Electronically Signed By: Claudie Revering M.D. On: 01/03/2020 08:35  EXAM: LUMBAR SPINE - COMPLETE 4+ VIEW  COMPARISON: Lumbar spine MR dated 08/19/2019  FINDINGS: Transitional L1 vertebra. The hypointense lesion seen in the L1 vertebral body on the previous MRI is sclerotic on the radiographs today. This measures 2 cm in maximum diameter. Large anterior spurs at the T12-L1 level. Additional lateral and anterior spurs at multiple levels of the lumbar and lower thoracic spine. Mid and lower lumbar spine facet degenerative changes. No fractures, pars defects or subluxations. Atheromatous arterial calcifications without visible aneurysm.  IMPRESSION: 1. 2 cm nonspecific sclerotic lesion in the L1 vertebral body. This could represent a sclerotic metastasis or large bone island. This could be further assessed with a radionuclide bone scan. 2. Multilevel degenerative changes. 3. No fracture or subluxation. 4. Aortic atherosclerosis.   Electronically Signed By: Claudie Revering M.D. On: 01/03/2020 08:32   EXAM: CT HEAD WITHOUT CONTRAST  CT CERVICAL SPINE WITHOUT CONTRAST  TECHNIQUE: Multidetector CT imaging of the head and cervical spine was performed following the standard protocol without intravenous contrast. Multiplanar CT image reconstructions of the cervical spine were also generated.  COMPARISON: Head CT dated 05/27/2017  FINDINGS: CT HEAD FINDINGS  Brain: Normal appearing cerebral hemispheres and posterior fossa structures. Normal size and position of the ventricles. No intracranial hemorrhage, mass lesion or CT evidence of  acute infarction.  Vascular: No hyperdense vessel or unexpected calcification.  Skull: Normal. Negative for fracture or focal lesion.  Sinuses/Orbits: Bilateral sphenoid and maxillary sinus retention cysts. Two symmetrical areas of calcification or postsurgical changes in the posterior aspect of each globe, possibly due to previous retinal detachment repairs.  Other: None.  CT CERVICAL SPINE FINDINGS  Alignment: Normal.  Skull base and vertebrae: No acute fracture. No primary bone lesion or focal pathologic process.  Soft tissues and spinal canal: No prevertebral fluid or swelling. No visible canal hematoma.  Disc levels: Multilevel degenerative changes.  Upper chest: Clear lung apices. 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  Other: Bilateral carotid artery calcifications.  IMPRESSION: 1. No skull fracture or intracranial hemorrhage. 2. No cervical spine fracture or subluxation. 3. Multilevel cervical spine degenerative changes. 4. Bilateral carotid artery atheromatous calcifications.   Electronically Signed By: Claudie Revering M.D. On: 01/03/2020 08:14  MDM  Patient here with recurrent LOC.  Patient chart reviewed for additional history:   Seen by cardiology 10/19/19:  Per note A&P:  COUGH: He has cough and I think resulted in syncope.  This almost definitely sounds like it is related  to his swallowing problem.  I sent a message to his primary provider today to ask about considering a swallowing study versus a GI study.  He has the CT outstanding.  I will defer to [name], FNP.   Seen by GI 12/10/19: Plan for barium swallow with tablet, may require EGD, speech pathology eval also being considered.  12/15/19: DG esophagus with double CM:  IMPRESSION: 1. Small reducible hiatal hernia with mild gastroesophageal reflux. 2. No evidence of esophageal stricture, ulceration or stricture. 3. Aberrant right subclavian artery  noted on prior CT without significant mass effect on the esophagus.  Labs reviewed and interpreted, fairly unremarkable. Negative orthostatic vital signs.  CT head/C-spine without acute injury or obvious mass. L-spine x-ray with sclerotic lesion, this has been seen previously, patient had an NM bone scan of the whole body 08/24/2019:  IMPRESSION: 1. There is no abnormal bone scan activity within the sclerotic L1 lesion seen on lumbar MRI. This finding is indeterminate. Suggest follow-up lumbar MRI in approximately 6 months. 2. There is no osseous activity highly suspicious for metastatic disease. Possible indeterminate activity in the mid sacrum on the posterior images, without clear correlate on recent lumbar MRI, possibly posttraumatic. 3. Scattered arthropathic activity as described.  Left upper extremity x-rays notable for nondisplaced fracture in the proximal, anterior aspect of the olecranon.  There is also a large olecranon enthesophyte with a fracture in the proximal portion.  All imaging reviewed and personally interpreted.  09:33: CONSULT: Discussed with neurologist Dr. Rory Percy- recommends MRI brain w/wo contrast, neurology team will come see patient in the ED and provide further recommendations.  09:52: CONSULT: Discussed with orthopedics team Hilbert Odor PA-C- reviewed images- if patient can extend elbow does not need splint, can use as tolerated. Agrees that image in media of L elbow are concerning for a bursitis- recommends needle aspiration to obtain culture, broad spectrum abx, will likely need formal I&D at some point. Patient may be admitted to hospital for non-orthopedics purposes- will call back if this is the case.  10:40: CONSULT: Re-discussed with neurology team- recommendation for EEG & observation overnight.   10:50: CONSULT: Re-discussed with orthopedics team, in agreement with cefazolin for antibiotic selection, will see patient in consultation.   11:08:  CONSULT: Discussed with hospitalist Dr. Lorin Mercy- accepts admission.   Patient & son @ bedside updated on results & plan of care, in agreement.   Findings and plan of care discussed with supervising physician Dr. Johnney Killian who is in agreement.       Leafy Kindle 01/03/20 1319    Charlesetta Shanks, MD 01/04/20 1513

## 2020-01-03 NOTE — Procedures (Signed)
Patient Name: Eric Saunders  MRN: [id number]  Epilepsy Attending: Lora Havens  Referring Physician/Provider: Kennith Maes, PA Date: 01/03/2020 Duration: 23.28 mins  Patient history: 64 year old male with multiple stereotypical episodes of passing out associated with coughing and whole-body jerking.  EEG evaluate for seizures.  Level of alertness: Awake  AEDs during EEG study: None  Technical aspects: This EEG study was done with scalp electrodes positioned according to the 10-20 International system of electrode placement. Electrical activity was acquired at a sampling rate of 500Hz  and reviewed with a high frequency filter of 70Hz  and a low frequency filter of 1Hz . EEG data were recorded continuously and digitally stored.   Description:  The posterior dominant rhythm consists of 9-10 Hz activity of moderate voltage (25-35 uV) seen predominantly in posterior head regions, symmetric and reactive to eye opening and eye closing.  Sleep was characterized by vertex waves, sleep spindles (12 to 14 Hz), maximal frontocentral region.  Physiologic photic driving was seen during photic stimulation.  Hyperventilation was not performed.  IMPRESSION: This study is within normal limits. No seizures or epileptiform discharges were seen throughout the recording.  Carle Fenech Barbra Sarks

## 2020-01-04 ENCOUNTER — Observation Stay (HOSPITAL_COMMUNITY): Payer: BC Managed Care – PPO

## 2020-01-04 ENCOUNTER — Observation Stay (HOSPITAL_BASED_OUTPATIENT_CLINIC_OR_DEPARTMENT_OTHER): Payer: BC Managed Care – PPO

## 2020-01-04 DIAGNOSIS — I10 Essential (primary) hypertension: Secondary | ICD-10-CM | POA: Diagnosis not present

## 2020-01-04 DIAGNOSIS — J449 Chronic obstructive pulmonary disease, unspecified: Secondary | ICD-10-CM | POA: Diagnosis not present

## 2020-01-04 DIAGNOSIS — Z20822 Contact with and (suspected) exposure to covid-19: Secondary | ICD-10-CM | POA: Diagnosis not present

## 2020-01-04 DIAGNOSIS — M7022 Olecranon bursitis, left elbow: Secondary | ICD-10-CM | POA: Diagnosis not present

## 2020-01-04 DIAGNOSIS — R569 Unspecified convulsions: Secondary | ICD-10-CM | POA: Diagnosis not present

## 2020-01-04 DIAGNOSIS — M7989 Other specified soft tissue disorders: Secondary | ICD-10-CM | POA: Diagnosis not present

## 2020-01-04 DIAGNOSIS — M25512 Pain in left shoulder: Secondary | ICD-10-CM | POA: Diagnosis not present

## 2020-01-04 DIAGNOSIS — M79645 Pain in left finger(s): Secondary | ICD-10-CM | POA: Diagnosis not present

## 2020-01-04 DIAGNOSIS — R9431 Abnormal electrocardiogram [ECG] [EKG]: Secondary | ICD-10-CM | POA: Diagnosis not present

## 2020-01-04 DIAGNOSIS — R55 Syncope and collapse: Secondary | ICD-10-CM | POA: Diagnosis not present

## 2020-01-04 DIAGNOSIS — S6992XA Unspecified injury of left wrist, hand and finger(s), initial encounter: Secondary | ICD-10-CM | POA: Diagnosis not present

## 2020-01-04 DIAGNOSIS — S4992XA Unspecified injury of left shoulder and upper arm, initial encounter: Secondary | ICD-10-CM | POA: Diagnosis not present

## 2020-01-04 DIAGNOSIS — S52025A Nondisplaced fracture of olecranon process without intraarticular extension of left ulna, initial encounter for closed fracture: Secondary | ICD-10-CM | POA: Diagnosis not present

## 2020-01-04 DIAGNOSIS — F411 Generalized anxiety disorder: Secondary | ICD-10-CM | POA: Diagnosis not present

## 2020-01-04 LAB — CBC
HCT: 43.9 % (ref 39.0–52.0)
Hemoglobin: 14.4 g/dL (ref 13.0–17.0)
MCH: 29.2 pg (ref 26.0–34.0)
MCHC: 32.8 g/dL (ref 30.0–36.0)
MCV: 89 fL (ref 80.0–100.0)
Platelets: 394 10*3/uL (ref 150–400)
RBC: 4.93 MIL/uL (ref 4.22–5.81)
RDW: 13.2 % (ref 11.5–15.5)
WBC: 7.8 10*3/uL (ref 4.0–10.5)
nRBC: 0 % (ref 0.0–0.2)

## 2020-01-04 LAB — BASIC METABOLIC PANEL
Anion gap: 11 (ref 5–15)
BUN: 19 mg/dL (ref 8–23)
CO2: 26 mmol/L (ref 22–32)
Calcium: 8.7 mg/dL — ABNORMAL LOW (ref 8.9–10.3)
Chloride: 102 mmol/L (ref 98–111)
Creatinine, Ser: 1.17 mg/dL (ref 0.61–1.24)
GFR calc Af Amer: >60 mL/min (ref >60)
GFR calc non Af Amer: >60 mL/min (ref >60)
Glucose, Bld: 92 mg/dL (ref 70–99)
Potassium: 3.8 mmol/L (ref 3.5–5.1)
Sodium: 139 mmol/L (ref 135–145)

## 2020-01-04 LAB — LIPID PANEL
Cholesterol: 167 mg/dL (ref 0–200)
HDL: 30 mg/dL — ABNORMAL LOW (ref >40)
LDL Cholesterol: 73 mg/dL (ref 0–99)
Total CHOL/HDL Ratio: 5.6 RATIO
Triglycerides: 318 mg/dL — ABNORMAL HIGH (ref <150)
VLDL: 64 mg/dL — ABNORMAL HIGH (ref 0–40)

## 2020-01-04 LAB — ECHOCARDIOGRAM COMPLETE

## 2020-01-04 MED ORDER — CEPHALEXIN 500 MG PO CAPS: 1000.0000 mg | ORAL_CAPSULE | Freq: Two times a day (BID) | ORAL | 0 refills | Status: AC

## 2020-01-04 MED ORDER — GUAIFENESIN ER 600 MG PO TB12: 1200.0000 mg | ORAL_TABLET | Freq: Two times a day (BID) | ORAL | 0 refills | Status: AC

## 2020-01-04 NOTE — Progress Notes (Signed)
Neurology Progress Note   S:// NAE   O:// Current vital signs: BP (!) 150/82 (BP Location: Left Arm)   Pulse (!) 59   Temp 98.2 F (36.8 C) (Oral)   Resp 18   SpO2 96%  Vital signs in last 24 hours: Temp:  [97.8 F (36.6 C)-98.7 F (37.1 C)] 98.2 F (36.8 C) (04/20 0911) Pulse Rate:  [59-91] 59 (04/20 0911) Resp:  [17-21] 18 (04/20 0911) BP: (125-159)/(70-127) 150/82 (04/20 0911) SpO2:  [90 %-96 %] 96 % (04/20 0911) GENERAL: Awake, alert in NAD HEENT: - Normocephalic and atraumatic, dry mm, no LN++, no Thyromegally LUNGS - Clear to auscultation bilaterally with no wheezes CV - S1S2 RRR, no m/r/g, equal pulses bilaterally. ABDOMEN - Soft, nontender, nondistended with normoactive BS Ext: warm, well perfused, intact peripheral pulses, no edema NEURO:  Mental Status: AA&Ox3  Language: speech is clear.  Naming, repetition, fluency, and comprehension intact. Cranial Nerves: PERRL. EOMI, visual fields full, no facial asymmetry, facial sensation intact, hearing intact, tongue/uvula/soft palate midline, normal sternocleidomastoid and trapezius muscle strength. No evidence of tongue atrophy or fibrillations Motor: 5/5 all 4s Tone: is normal and bulk is normal Sensation- Intact to light touch bilaterally Coordination: FTN intact bilaterally, no ataxia in BLE. Gait- deferred  NIHSS-0  Medications  Current Facility-Administered Medications:  .  acetaminophen (TYLENOL) tablet 650 mg, 650 mg, Oral, Q6H PRN **OR** acetaminophen (TYLENOL) suppository 650 mg, 650 mg, Rectal, Q6H PRN, Karmen Bongo, MD .  albuterol (PROVENTIL) (2.5 MG/3ML) 0.083% nebulizer solution 2.5 mg, 2.5 mg, Nebulization, Q2H PRN, Karmen Bongo, MD .  ALPRAZolam Duanne Moron) tablet 0.5 mg, 0.5 mg, Oral, BID PRN, Karmen Bongo, MD .  aspirin EC tablet 81 mg, 81 mg, Oral, Daily, Karmen Bongo, MD, 81 mg at 01/04/20 0957 .  ceFAZolin (ANCEF) IVPB 2g/100 mL premix, 2 g, Intravenous, Q8H, Karmen Bongo, MD, Last  Rate: 200 mL/hr at 01/04/20 0632, 2 g at 01/04/20 [id number] .  enoxaparin (LOVENOX) injection 40 mg, 40 mg, Subcutaneous, Q24H, Karmen Bongo, MD, 40 mg at 01/03/20 1614 .  escitalopram (LEXAPRO) tablet 10 mg, 10 mg, Oral, Daily, Karmen Bongo, MD, 10 mg at 01/04/20 0957 .  fluticasone furoate-vilanterol (BREO ELLIPTA) 100-25 MCG/INH 1 puff, 1 puff, Inhalation, q morning - 10a, Karmen Bongo, MD .  guaiFENesin [facility]) 12 hr tablet 1,200 mg, 1,200 mg, Oral, BID, Karmen Bongo, MD, 1,200 mg at 01/04/20 0957 .  lactated ringers infusion, , Intravenous, Continuous, Karmen Bongo, MD, Last Rate: 75 mL/hr at 01/04/20 0926, New Bag at 01/04/20 0926 .  metoprolol succinate (TOPROL-XL) 24 hr tablet 25 mg, 25 mg, Oral, Daily, Karmen Bongo, MD, 25 mg at 01/04/20 0957 .  nicotine (NICODERM CQ - dosed in mg/24 hours) patch 21 mg, 21 mg, Transdermal, Daily, Karmen Bongo, MD .  ondansetron [facility]) tablet 4 mg, 4 mg, Oral, Q6H PRN **OR** ondansetron (ZOFRAN) injection 4 mg, 4 mg, Intravenous, Q6H PRN, Karmen Bongo, MD .  pantoprazole (PROTONIX) EC tablet 40 mg, 40 mg, Oral, Daily, Karmen Bongo, MD, 40 mg at 01/04/20 0957 .  pravastatin (PRAVACHOL) tablet 80 mg, 80 mg, Oral, Daily, Karmen Bongo, MD, 80 mg at 01/04/20 0957 .  sodium chloride flush (NS) 0.9 % injection 3 mL, 3 mL, Intravenous, Q12H, Karmen Bongo, MD, 3 mL at 01/03/20 1616 Labs CBC    Component Value Date/Time   WBC 7.8 01/04/2020 0210   RBC 4.93 01/04/2020 0210   HGB 14.4 01/04/2020 0210   HGB 16.2 05/22/2017 0950   HCT 43.9  01/04/2020 0210   HCT 48.6 05/22/2017 0950   PLT 394 01/04/2020 0210   PLT 325 05/22/2017 0950   MCV 89.0 01/04/2020 0210   MCV 88 05/22/2017 0950   MCH 29.2 01/04/2020 0210   MCHC 32.8 01/04/2020 0210   RDW 13.2 01/04/2020 0210   RDW 14.2 05/22/2017 0950   LYMPHSABS 2.4 05/22/2017 0950   MONOABS 0.7 06/26/2014 1323   EOSABS 0.3 05/22/2017 0950   BASOSABS 0.0 05/22/2017 0950    CMP      Component Value Date/Time   NA 139 01/04/2020 0210   NA 143 04/06/2019 0908   K 3.8 01/04/2020 0210   CL 102 01/04/2020 0210   CO2 26 01/04/2020 0210   GLUCOSE 92 01/04/2020 0210   BUN 19 01/04/2020 0210   BUN 23 04/06/2019 0908   CREATININE 1.17 01/04/2020 0210   CREATININE 1.04 08/10/2014 0913   CALCIUM 8.7 (L) 01/04/2020 0210   PROT 7.4 04/06/2019 0908   ALBUMIN 4.8 04/06/2019 0908   AST 16 04/06/2019 0908   ALT 20 04/06/2019 0908   ALKPHOS 54 04/06/2019 0908   BILITOT 0.2 04/06/2019 0908   GFRNONAA >60 01/04/2020 0210   GFRNONAA 59 (L) 06/10/2014 1739   GFRAA >60 01/04/2020 0210   GFRAA 69 06/10/2014 1739    glycosylated hemoglobin  Lipid Panel     Component Value Date/Time   CHOL 167 01/04/2020 0210   CHOL 205 (H) 04/06/2019 0908   CHOL 166 12/28/2012 0932   TRIG 318 (H) 01/04/2020 0210   TRIG 157 (H) 11/23/2014 1549   TRIG 129 12/28/2012 0932   HDL 30 (L) 01/04/2020 0210   HDL 43 04/06/2019 0908   HDL 44 11/23/2014 1549   HDL 42 12/28/2012 0932   CHOLHDL 5.6 01/04/2020 0210   VLDL 64 (H) 01/04/2020 0210   LDLCALC 73 01/04/2020 0210   LDLCALC 127 (H) 04/06/2019 0908   LDLCALC 90 07/06/2014 0000   LDLCALC 98 12/28/2012 0932    EEG negative   Imaging I have reviewed images in epic and the results pertinent to this consultation are: MRI brain with no acute changes.  Some scattered chronic microhemorrhages likely from history of tobacco abuse and high blood pressure  Assessment: 64 year old male with episodes of passing out associated with significant coughing.  Due to stereotypical nature, there is always a concern for this being an electrographic abnormality like a seizure-but EEG was done-negative for seizures/electrographic abnormalities. MRI also negative with the exception of some scattered microhemorrhages likely from the history of tobacco abuse and smoking. Has cardiology and GI on board for further work-up. From a neurological standpoint, these do  not sound like seizures-likely vasovagal in etiology versus panic attacks.  Family did mention some anxiety and panic attacks situations in the past.  Recommendations: Do maintain seizure precautions although I will not start any antiepileptics. Do not drive unless 6 months free of any episodes of loss of consciousness Follow-up with outpatient neurology as needed Follow-up with cardiology and GI If these episodes recur without any clear explanation and do not have any benefit with starting a low-dose benzo, if he is admitted again, consider doing long-term EEG for further characterization of the spells.  I had a detailed discussion about the neurological work-up including imaging and EEG findings with the patient and his daughter and wife at bedside.  I also explained the precautions especially no driving for 6 months for the state law and they all verbalized understanding.  Discussed my plan with Dr.  Ezenduka.   -- Amie Portland, MD [facility] Pager: [phone] If 7pm to 7am, please call on call as listed on AMION.

## 2020-01-04 NOTE — Progress Notes (Signed)
DISCHARGE NOTE HOME Eric Saunders to be discharged Home per MD order. Discussed prescriptions and follow up appointments with the patient. Prescriptions given to patient; medication list explained in detail. Patient verbalized understanding.  Skin clean, dry and intact without evidence of skin break down, no evidence of skin tears noted. IV catheter discontinued intact. Site without signs and symptoms of complications. Dressing and pressure applied. Pt denies pain at the site currently. No complaints noted.  Patient free of lines, drains, and wounds.   An After Visit Summary (AVS) was printed and given to the patient. Patient escorted via wheelchair, and discharged home via private auto.  Arlyss Repress, RN

## 2020-01-04 NOTE — Evaluation (Signed)
Physical Therapy Evaluation and Discharge Patient Details Name: Eric Saunders MRN: [id number] DOB: 28-Feb-1956 Today's Date: 01/04/2020   History of Present Illness  Eric Saunders is a 64 y.o. male with medical history significant of HTN; HLD; panic disorder; and CAD presenting with syncope.  In the last 5 weeks, he has passed out 5 times from coughing.  He had one episode 4/18 within a week of another;  When he falls down, if he isn't sitting it tears up his arms (lacs). Ortho consulted for Elbow wound, concern for infection.  Clinical Impression   Patient evaluated by Physical Therapy with no further acute PT needs identified. All education has been completed and the patient has no further questions. Overall walking well, no episode of coughing and syncope this session; Reports more L hand/finger and shoulder pain in addition to elbow injury -- recommend Outpt Ortho follow up with possible Outpt PT for LUE;  See below for any follow-up Physical Therapy or equipment needs. PT is signing off. Thank you for this referral.     Follow Up Recommendations Outpatient PT(The potential need for Outpatient PT can be addressed at Ortho follow-up appointments. )    Equipment Recommendations  None recommended by PT    Recommendations for Other Services       Precautions / Restrictions Precautions Precautions: Other (comment) Precaution Comments: Fall risk if he begins a coughing spell      Mobility  Bed Mobility Overal bed mobility: Independent                Transfers Overall transfer level: Independent                  Ambulation/Gait Ambulation/Gait assistance: Independent Gait Distance (Feet): 1000 Feet Assistive device: None;IV Pole Gait Pattern/deviations: WFL(Within Functional Limits)   Gait velocity interpretation: >4.37 ft/sec, indicative of normal walking speed General Gait Details: No difficulty  Stairs            Wheelchair Mobility    Modified  Rankin (Stroke Patients Only)       Balance Overall balance assessment: No apparent balance deficits (not formally assessed)                                           Pertinent Vitals/Pain Pain Assessment: 0-10 Pain Score: 8  Pain Location: L shoulder and index finger; elbow as well Pain Descriptors / Indicators: Aching(Occasional sharp pain when moves the wrong way, subsides quickly) Pain Intervention(s): Monitored during session    Home Living Family/patient expects to be discharged to:: Private residence Living Arrangements: Spouse/significant other Available Help at Discharge: Family Type of Home: House Home Access: Stairs to enter Entrance Stairs-Rails: Right Entrance Stairs-Number of Steps: 6 Home Layout: One level Home Equipment: None      Prior Function Level of Independence: Independent         Comments: Enjoys working in the yard, goes on neighborhood walks regularly     Hand Dominance   Dominant Hand: Left    Extremity/Trunk Assessment   Upper Extremity Assessment Upper Extremity Assessment: LUE deficits/detail LUE Deficits / Details: L hand and finger pain with gross fist-making flexion; decr Pain free shoulder ROM    Lower Extremity Assessment Lower Extremity Assessment: Overall WFL for tasks assessed       Communication   Communication: No difficulties  Cognition Arousal/Alertness: Awake/alert Behavior During Therapy:  WFL for tasks assessed/performed Overall Cognitive Status: Within Functional Limits for tasks assessed                                        General Comments      Exercises     Assessment/Plan    PT Assessment All further PT needs can be met in the next venue of care  PT Problem List Decreased strength;Decreased range of motion;Decreased activity tolerance;Pain       PT Treatment Interventions      PT Goals (Current goals can be found in the Care Plan section)  Acute Rehab PT  Goals Patient Stated Goal: Hopes to go home today; wants these episodes to stop PT Goal Formulation: All assessment and education complete, DC therapy    Frequency     Barriers to discharge        Co-evaluation               AM-PAC PT "6 Clicks" Mobility  Outcome Measure Help needed turning from your back to your side while in a flat bed without using bedrails?: None Help needed moving from lying on your back to sitting on the side of a flat bed without using bedrails?: None Help needed moving to and from a bed to a chair (including a wheelchair)?: None Help needed standing up from a chair using your arms (e.g., wheelchair or bedside chair)?: None Help needed to walk in hospital room?: None Help needed climbing 3-5 steps with a railing? : None 6 Click Score: 24    End of Session   Activity Tolerance: Patient tolerated treatment well Patient left: in bed;with call bell/phone within reach Nurse Communication: Mobility status PT Visit Diagnosis: History of falling (Z91.81);Pain Pain - Right/Left: Left Pain - part of body: Arm;Hand    Time: 1229-1250 PT Time Calculation (min) (ACUTE ONLY): 21 min   Charges:   PT Evaluation $PT Eval Low Complexity: [place], PT  Acute Rehabilitation Services Pager [phone] Office [phone]   [place] 01/04/2020, 2:01 PM

## 2020-01-04 NOTE — Progress Notes (Signed)
Carotid artery duplex has been completed. Preliminary results can be found in CV Proc through chart review.   01/04/20 8:57 AM Carlos Levering RVT

## 2020-01-04 NOTE — Discharge Summary (Signed)
Discharge Summary  Eric Saunders [id number] DOB: [birth year]  PCP: [name], FNP  Admit date: 01/02/2020 Discharge date: 01/04/2020  Time spent: 30 mins   Recommendations for Outpatient Follow-up:  1. PCP in 1 week 2. Orthopedics as scheduled 3. Follow-up with cardiology as scheduled 4. Follow-up with GI as scheduled 5. Neurology as needed    Discharge Diagnoses:  Active Hospital Problems   Diagnosis Date Noted  . Syncope 01/03/2020  . Olecranon fracture, left, closed, initial encounter 01/03/2020  . Olecranon bursitis of left elbow 01/03/2020  . COPD (chronic obstructive pulmonary disease) ([facility]) 01/03/2020  . Obesity (BMI 30.0-34.9) 04/06/2019  . Essential hypertension 04/06/2019  . Tobacco abuse 08/16/2014  . GAD (generalized anxiety disorder) 12/28/2012    Resolved Hospital Problems  No resolved problems to display.    Discharge Condition: Stable  Diet recommendation: Heart healthy  Vitals:   01/04/20 0636 01/04/20 0911  BP: (!) 142/70 (!) 150/82  Pulse: 61 (!) 59  Resp: 18 18  Temp: 98.2 F (36.8 C) 98.2 F (36.8 C)  SpO2: 93% 96%    History of present illness:  Eric Saunders is a 64 y.o. male with medical history significant of HTN; HLD; panic disorder; and CAD presenting with syncope.  In the last 5 weeks, he has passed out 5 times from coughing. It is not his usual cough, but he starts coughing and it makes him pass out. Pt has been to GI and cardiology for evaluation about these issues.  He had a normal esophogram and is being sent to ENT.  He has a chronic cough and continues to smoke.  His cough before these episodes is "like I'm choking on phlegm I'm choking so hard" and he sees stars.  He feels exhausted afterwards but is not confused.  The episode lasts maybe 30 seconds and the exhaustion persists for hours afterward.  He has not bitten his tongue or urinary incontinence but did have fecal incontinence one time. He notices his left arm  twitches at times when this happens but not all the time.  He takes Xanax as needed for panic attacks.  He took a Xanax after the last episode.  He has not worked in 2 years due to a bad accident on the job. He has chronic LBP as a result.  His elbow does not really hurt unless he hits it on something.  Chronic LLL medial numbness.  Patient admitted for further management.  Neurology was consulted.    Today, patient denies any syncopal episodes since admission, but patient and family does report it usually happens on Sundays.  According to family, episodes are getting more frequent.  Patient with persistent cough but still continues to smoke cigarettes, and refuses to use his inhaler.  Patient currently denies any chest pain, abdominal pain, nausea/vomiting, fever/chills.  Does report left-sided elbow, shoulder and hand pain most likely from recent fall.  Patient had extensive neurologic work-up done, all unremarkable.  Stable to discharge from neurology standpoint.  Discussed with patient about abstaining from driving unless 6 months free of any episodes of loss of consciousness.  Patient was able to ambulate with PT/OT up and down the hallway, no further needs required    Hospital Course:  Principal Problem:   Syncope Active Problems:   GAD (generalized anxiety disorder)   Tobacco abuse   Obesity (BMI 30.0-34.9)   Essential hypertension   Olecranon fracture, left, closed, initial encounter   Olecranon bursitis of left elbow   COPD (  chronic obstructive pulmonary disease) ([facility])   Syncopal conversion versus possible seizure activity Unclear etiology Each episode appears to be triggered by cough, history of panic attacks Extensive neurologic work-up including MRI brain, EEG, carotid Dopplers, 2D echo unremarkable Neurology consulted, no need for any antiepileptic medication, abstain from driving for 6 months unless free of any seizure-like activity/loss of consciousness Follow-up with  neurology as needed Follow-up with PCP as scheduled  L elbow fracture/olecranon bursitis/left shoulder/hand pain Likely from fall Currently afebrile, with no leukocytosis Seen by ortho, no need for splinting and WBAT for fracture Joint aspiration in ER, currently no growth Left shoulder and hand pain negative for any fractures Status post Ancef, will transition to p.o. Keflex for 7 days Orthopedics consulted, plan to follow-up with them in 1 week for further management and to follow-up on cultures  Prediabetes A1c 6 Follow-up with PCP  Hypertriglyceridemia TG 318, LDL 73, HDL 30 Likely due to alcohol consumption Advised to quit  HTN Continue home regimen  Anxiety Patient reports having panic attacks but these episodes are different than his usual panic Continue Lexapro  COPD with ongoing tobacco dependence Chronic cough appears to be due to COPD Continue Breo and add prn Albuterol Mucinex for cough suppression Smokes about 1-1/2 pack/day, reluctant to quit.  Discussed to quit  Obesity BMI 30.9 Lifestyle modification advised         Malnutrition Type:      Malnutrition Characteristics:      Nutrition Interventions:      Estimated body mass index is 30.87 kg/m as calculated from the following:   Height as of 12/10/19: 5\' 8"  (1.727 m).   Weight as of 12/10/19: 92.1 kg.    Procedures:  None  Consultations:  Neurology  Discharge Exam: BP (!) 150/82 (BP Location: Left Arm)   Pulse (!) 59   Temp 98.2 F (36.8 C) (Oral)   Resp 18   SpO2 96%   General: NAD Cardiovascular: S1, S2 present Respiratory: Diminished breath sounds bilaterally Neurology: Strength equal in all extremities, no focal neurologic deficits noted     Discharge Instructions You were cared for by a hospitalist during your hospital stay. If you have any questions about your discharge medications or the care you received while you were in the hospital after you are  discharged, you can call the unit and asked to speak with the hospitalist on call if the hospitalist that took care of you is not available. Once you are discharged, your primary care physician will handle any further medical issues. Please note that NO REFILLS for any discharge medications will be authorized once you are discharged, as it is imperative that you return to your primary care physician (or establish a relationship with a primary care physician if you do not have one) for your aftercare needs so that they can reassess your need for medications and monitor your lab values.  Discharge Instructions    Diet - low sodium heart healthy   Complete by: As directed    Increase activity slowly   Complete by: As directed      Allergies as of 01/04/2020      Reactions   Ace Inhibitors Cough   Other       Medication List    TAKE these medications   ALPRAZolam 0.5 MG tablet Commonly known as: Xanax Take 1 tablet (0.5 mg total) by mouth 2 (two) times daily as needed for anxiety.   amLODipine 10 MG tablet Commonly known as: NORVASC  Take 1 tablet (10 mg total) by mouth daily.   aspirin 81 MG tablet Take 81 mg by mouth daily.   Breo Ellipta 100-25 MCG/INH Aepb Generic drug: fluticasone furoate-vilanterol Inhale 1 puff into the lungs every morning.   cephALEXin 500 MG capsule Commonly known as: KEFLEX Take 2 capsules (1,000 mg total) by mouth 2 (two) times daily for 7 days.   escitalopram 10 MG tablet Commonly known as: LEXAPRO Take 1 tablet (10 mg total) by mouth daily.   fenofibrate 160 MG tablet Take 1 tablet (160 mg total) by mouth daily.   guaiFENesin 600 MG 12 hr tablet Commonly known as: MUCINEX Take 2 tablets (1,200 mg total) by mouth 2 (two) times daily for 7 days.   hydrochlorothiazide 25 MG tablet Commonly known as: HYDRODIURIL Take 1 tablet (25 mg total) by mouth daily.   losartan 100 MG tablet Commonly known as: COZAAR Take 1 tablet (100 mg total) by mouth  daily.   metoprolol succinate 25 MG 24 hr tablet Commonly known as: TOPROL-XL Take 1 tablet (25 mg total) by mouth daily.   omeprazole 20 MG capsule Commonly known as: PRILOSEC Take 1 capsule (20 mg total) by mouth in the morning and at bedtime. What changed: when to take this   pravastatin 80 MG tablet Commonly known as: PRAVACHOL Take 1 tablet (80 mg total) by mouth daily.   sildenafil 100 MG tablet Commonly known as: Viagra Take 0.5-1 tablets (50-100 mg total) by mouth daily as needed for erectile dysfunction.      Allergies  Allergen Reactions  . Ace Inhibitors Cough  . Other    Follow-up Information    [name], FNP. Schedule an appointment as soon as possible for a visit in 1 week(s).   Specialty: Family Medicine Contact information: [place] [address] [phone]        Minus Breeding, MD .   Specialty: Cardiology Contact information: [address] [place] [address] [place] [address] [phone]        Nicholes Stairs, MD. Schedule an appointment as soon as possible for a visit in 1 week(s).   Specialty: Orthopedic Surgery Why: To follow up on your elbow Contact information: [address] [place] [address] [phone]            The results of significant diagnostics from this hospitalization (including imaging, microbiology, ancillary and laboratory) are listed below for reference.    Significant Diagnostic Studies: DG Lumbar Spine Complete  Result Date: 01/03/2020 CLINICAL DATA:  Chronic low back pain. Multiple syncopal episodes. EXAM: LUMBAR SPINE - COMPLETE 4+ VIEW COMPARISON:  Lumbar spine MR dated 08/19/2019 FINDINGS: Transitional L1 vertebra. The hypointense lesion seen in the L1 vertebral body on the previous MRI is sclerotic on the radiographs today. This measures 2 cm in maximum diameter. Large anterior spurs at the T12-L1 level. Additional lateral and anterior spurs at  multiple levels of the lumbar and lower thoracic spine. Mid and lower lumbar spine facet degenerative changes. No fractures, pars defects or subluxations. Atheromatous arterial calcifications without visible aneurysm. IMPRESSION: 1. 2 cm nonspecific sclerotic lesion in the L1 vertebral body. This could represent a sclerotic metastasis or large bone island. This could be further assessed with a radionuclide bone scan. 2. Multilevel degenerative changes. 3. No fracture or subluxation. 4. Aortic atherosclerosis. Electronically Signed   By: Claudie Revering M.D.   On: 01/03/2020 08:32   DG Elbow Complete Left  Result Date: 01/03/2020 CLINICAL DATA:  Left elbow pain. Multiple recent syncopal episodes. EXAM: LEFT ELBOW - COMPLETE 3+ VIEW COMPARISON:  Right humerus and forearm radiographs obtained at the same time. FINDINGS: Previously described nondisplaced olecranon and olecranon enthesophyte fractures with associated posterior soft tissue swelling. No additional fractures and no dislocations. No effusion seen. IMPRESSION: Previously described nondisplaced olecranon and olecranon enthesophyte fractures with associated posterior soft tissue swelling. Electronically Signed   By: Claudie Revering M.D.   On: 01/03/2020 08:37   DG Forearm Left  Result Date: 01/03/2020 CLINICAL DATA:  Left forearm pain following multiple recent syncopal episodes. EXAM: LEFT FOREARM - 2 VIEW COMPARISON:  Left humerus radiographs obtained at the same time. FINDINGS: Previously described nondisplaced olecranon fracture and nondisplaced olecranon enthesophyte fracture with associated posterior soft tissue swelling. No additional fractures or dislocations seen. A corticated ossicle is noted distal to the ulnar styloid. IMPRESSION: Previously described nondisplaced olecranon fracture and nondisplaced olecranon enthesophyte fracture with associated posterior soft tissue swelling. Electronically Signed   By: Claudie Revering M.D.   On: 01/03/2020 08:35    CT Head Wo Contrast  Result Date: 01/03/2020 CLINICAL DATA:  Headache and neck pain following a syncopal episode. Two additional recent syncopal episodes. EXAM: CT HEAD WITHOUT CONTRAST CT CERVICAL SPINE WITHOUT CONTRAST TECHNIQUE: Multidetector CT imaging of the head and cervical spine was performed following the standard protocol without intravenous contrast. Multiplanar CT image reconstructions of the cervical spine were also generated. COMPARISON:  Head CT dated 05/27/2017 FINDINGS: CT HEAD FINDINGS Brain: Normal appearing cerebral hemispheres and posterior fossa structures. Normal size and position of the ventricles. No intracranial hemorrhage, mass lesion or CT evidence of acute infarction. Vascular: No hyperdense vessel or unexpected calcification. Skull: Normal. Negative for fracture or focal lesion. Sinuses/Orbits: Bilateral sphenoid and maxillary sinus retention cysts. Two symmetrical areas of calcification or postsurgical changes in the posterior aspect of each globe, possibly due to previous retinal detachment repairs. Other: None. CT CERVICAL SPINE FINDINGS Alignment: Normal. Skull base and vertebrae: No acute fracture. No primary bone lesion or focal pathologic process. Soft tissues and spinal canal: No prevertebral fluid or swelling. No visible canal hematoma. Disc levels:  Multilevel degenerative changes. Upper chest: Clear lung apices. 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 Other: Bilateral carotid artery calcifications. IMPRESSION: 1. No skull fracture or intracranial hemorrhage. 2. No cervical spine fracture or subluxation. 3. Multilevel cervical spine degenerative changes. 4. Bilateral carotid artery atheromatous calcifications. Electronically Signed   By: Claudie Revering M.D.   On: 01/03/2020 08:14   CT Cervical Spine Wo Contrast  Result Date: 01/03/2020 CLINICAL DATA:  Headache and neck pain following a syncopal episode. Two additional recent syncopal  episodes. EXAM: CT HEAD WITHOUT CONTRAST CT CERVICAL SPINE WITHOUT CONTRAST TECHNIQUE: Multidetector CT imaging of the head and cervical spine was performed following the standard protocol without intravenous contrast. Multiplanar CT image reconstructions of the cervical spine were also generated. COMPARISON:  Head CT dated 05/27/2017 FINDINGS: CT HEAD FINDINGS Brain: Normal appearing cerebral hemispheres and posterior fossa structures. Normal size and position of the ventricles. No intracranial hemorrhage, mass lesion or CT evidence of acute infarction. Vascular: No hyperdense vessel or unexpected calcification. Skull: Normal. Negative for fracture or focal lesion. Sinuses/Orbits: Bilateral sphenoid and maxillary sinus retention cysts. Two symmetrical areas of calcification or postsurgical changes in the posterior aspect of each globe, possibly due to previous retinal detachment repairs. Other: None. CT CERVICAL SPINE FINDINGS Alignment: Normal. Skull base and vertebrae: No acute fracture. No primary bone lesion  or focal pathologic process. Soft tissues and spinal canal: No prevertebral fluid or swelling. No visible canal hematoma. Disc levels:  Multilevel degenerative changes. Upper chest: Clear lung apices. 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 Other: Bilateral carotid artery calcifications. IMPRESSION: 1. No skull fracture or intracranial hemorrhage. 2. No cervical spine fracture or subluxation. 3. Multilevel cervical spine degenerative changes. 4. Bilateral carotid artery atheromatous calcifications. Electronically Signed   By: Claudie Revering M.D.   On: 01/03/2020 08:14   MR Brain W and Wo Contrast  Result Date: 01/03/2020 CLINICAL DATA:  Recurrent syncope versus seizure activity, patient coughs, see stars, tremulous activity in left upper extremity prior to passing out. EXAM: MRI HEAD WITHOUT AND WITH CONTRAST TECHNIQUE: Multiplanar, multiecho pulse sequences of the brain and  surrounding structures were obtained without and with intravenous contrast. CONTRAST:  12mL GADAVIST GADOBUTROL 1 MMOL/ML IV SOLN COMPARISON:  CT head 01/03/2020, CT head 12/25/2016. FINDINGS: Brain: Mild intermittent motion degradation. There is no evidence of acute infarct. No evidence of intracranial mass. No midline shift or extra-axial fluid collection. There are few nonspecific punctate foci of chronic microhemorrhage within the bilateral cerebral hemispheres. Otherwise, no focal parenchymal signal abnormality is identified. No abnormal intracranial enhancement is demonstrated. Cerebral volume is normal for age. Vascular: Flow voids maintained within the proximal large arterial vessels. Skull and upper cervical spine: No focal marrow lesion. Sinuses/Orbits: Visualized orbits demonstrate no acute abnormality. Small mucous retention cysts within the right sphenoid and bilateral maxillary sinuses. No significant mastoid effusion. IMPRESSION: Mildly motion degraded examination. No evidence of acute intracranial abnormality. There are few nonspecific punctate microhemorrhages within bilateral cerebral hemispheres. Small mucous retention cysts within the right sphenoid and bilateral maxillary sinuses. Electronically Signed   By: Kellie Simmering DO   On: 01/03/2020 12:04   DG Hand 2 View Left  Result Date: 01/04/2020 CLINICAL DATA:  Left ring finger and index finger pain following a fall. EXAM: LEFT HAND - 2 VIEW COMPARISON:  None. FINDINGS: Dorsal soft tissue swelling at the level of the MCP joints. Dorsal bandage material at the level of the mid metacarpals. No fracture, dislocation or radiopaque foreign body. Mild degenerative changes involving the 2nd through 5th PIP joints, 2nd and 3rd MCP joints and moderate degenerative changes involving the 1st IP joint. IMPRESSION: 1. No fracture. 2. Dorsal soft tissue swelling and degenerative changes, as described above. Electronically Signed   By: Claudie Revering M.D.   On:  01/04/2020 14:18   DG Shoulder Left Port  Result Date: 01/04/2020 CLINICAL DATA:  Left shoulder pain following a fall. EXAM: LEFT SHOULDER COMPARISON:  None. FINDINGS: Mild inferior acromioclavicular spur formation. Minimal inferior glenohumeral spur formation. No fracture or dislocation. IMPRESSION: No fracture or dislocation. Mild degenerative changes. Electronically Signed   By: Claudie Revering M.D.   On: 01/04/2020 14:17   DG Humerus Left  Result Date: 01/03/2020 CLINICAL DATA:  Left arm pain following recent syncopal episodes. EXAM: LEFT HUMERUS - 2+ VIEW COMPARISON:  None. FINDINGS: Mild hyperostosis of the superior aspect of the acromion. No humeral fracture or dislocation seen. Large olecranon enthesophyte with a fracture in the proximal portion of the enthesophyte. There is also a nondisplaced fracture proximal, anterior aspect of the olecranon. Posterior soft tissue swelling is noted centered at the elbow, most pronounced overlying the olecranon enthesophyte. IMPRESSION: 1. Nondisplaced fracture in the proximal, anterior aspect of the olecranon. 2. Large olecranon enthesophyte with a fracture in the proximal portion of the enthesophyte. 3. Posterior  soft tissue swelling centered at the level of the elbow. Electronically Signed   By: Claudie Revering M.D.   On: 01/03/2020 08:34   ECHOCARDIOGRAM COMPLETE  Result Date: 01/04/2020    ECHOCARDIOGRAM REPORT   Patient Name:   Eric Saunders Date of Exam: 01/04/2020 Medical Rec #:  [id number]      Height:       68.0 in Accession #:    [id number]     Weight:       203.0 lb Date of Birth:  05/06/1956      BSA:          2.057 m Patient Age:    9 years       BP:           142/70 mmHg Patient Gender: M              HR:           61 bpm. Exam Location:  Inpatient Procedure: 2D Echo Indications:    Abnormal EKG  History:        Patient has no prior history of Echocardiogram examinations.                 CAD, COPD; Risk Factors:Dyslipidemia, Current Smoker,                  Hypertension and Obesity.  Sonographer:    Vikki Ports Turrentine Referring Phys: [place]  1. Normal LV systolic and diastolic function.  2. Left ventricular ejection fraction, by estimation, is 55 to 60%. The left ventricle has normal function. The left ventricle has no regional wall motion abnormalities. Left ventricular diastolic parameters were normal.  3. Right ventricular systolic function is normal. The right ventricular size is normal.  4. The mitral valve is normal in structure. No evidence of mitral valve regurgitation. No evidence of mitral stenosis.  5. The aortic valve is tricuspid. Aortic valve regurgitation is not visualized. Mild aortic valve sclerosis is present, with no evidence of aortic valve stenosis.  6. The inferior vena cava is normal in size with greater than 50% respiratory variability, suggesting right atrial pressure of 3 mmHg. FINDINGS  Left Ventricle: Left ventricular ejection fraction, by estimation, is 55 to 60%. The left ventricle has normal function. The left ventricle has no regional wall motion abnormalities. The left ventricular internal cavity size was normal in size. There is  no left ventricular hypertrophy. Left ventricular diastolic parameters were normal. Right Ventricle: The right ventricular size is normal.Right ventricular systolic function is normal. Left Atrium: Left atrial size was normal in size. Right Atrium: Right atrial size was normal in size. Pericardium: There is no evidence of pericardial effusion. Mitral Valve: The mitral valve is normal in structure. Normal mobility of the mitral valve leaflets. No evidence of mitral valve regurgitation. No evidence of mitral valve stenosis. Tricuspid Valve: The tricuspid valve is normal in structure. Tricuspid valve regurgitation is trivial. No evidence of tricuspid stenosis. Aortic Valve: The aortic valve is tricuspid. Aortic valve regurgitation is not visualized. Mild aortic valve sclerosis is  present, with no evidence of aortic valve stenosis. Pulmonic Valve: The pulmonic valve was normal in structure. Pulmonic valve regurgitation is not visualized. No evidence of pulmonic stenosis. Aorta: The aortic root is normal in size and structure. Venous: The inferior vena cava is normal in size with greater than 50% respiratory variability, suggesting right atrial pressure of 3 mmHg. IAS/Shunts: No atrial level shunt detected by color flow  Doppler. Additional Comments: Normal LV systolic and diastolic function.  LEFT VENTRICLE PLAX 2D LVIDd:         4.40 cm  Diastology LVIDs:         3.00 cm  LV e' lateral:   11.90 cm/s LV PW:         1.10 cm  LV E/e' lateral: 7.9 LV IVS:        1.10 cm  LV e' medial:    8.05 cm/s LVOT diam:     2.10 cm  LV E/e' medial:  11.7 LV SV:         79 LV SV Index:   38 LVOT Area:     3.46 cm  RIGHT VENTRICLE RV S prime:     10.20 cm/s TAPSE (M-mode): 1.5 cm LEFT ATRIUM             Index       RIGHT ATRIUM           Index LA diam:        4.00 cm 1.94 cm/m  RA Area:     20.10 cm LA Vol (A2C):   38.2 ml 18.57 ml/m RA Volume:   59.10 ml  28.74 ml/m LA Vol (A4C):   57.3 ml 27.86 ml/m LA Biplane Vol: 49.3 ml 23.97 ml/m  AORTIC VALVE LVOT Vmax:   103.00 cm/s LVOT Vmean:  71.900 cm/s LVOT VTI:    0.227 m  AORTA Ao Root diam: 3.30 cm MITRAL VALVE MV Area (PHT): 3.34 cm    SHUNTS MV Decel Time: 227 msec    Systemic VTI:  0.23 m MV E velocity: 94.30 cm/s  Systemic Diam: 2.10 cm MV A velocity: 79.30 cm/s MV E/A ratio:  1.19 Kirk Ruths MD Electronically signed by Kirk Ruths MD Signature Date/Time: 01/04/2020/12:07:12 PM    Final    VAS US CAROTID  Result Date: 01/04/2020 Carotid Arterial Duplex Study Indications:       Syncope. Risk Factors:      Hypertension, current smoker. Comparison Study:  No prior studies. Performing Technologist: Oliver Hum RVT  Examination Guidelines: A complete evaluation includes B-mode imaging, spectral Doppler, color Doppler, and power Doppler as  needed of all accessible portions of each vessel. Bilateral testing is considered an integral part of a complete examination. Limited examinations for reoccurring indications may be performed as noted.  Right Carotid Findings: +----------+--------+-------+--------+--------------------------------+--------+           PSV cm/sEDV    StenosisPlaque Description              Comments                   cm/s                                                    +----------+--------+-------+--------+--------------------------------+--------+ CCA Prox  99      21             smooth and heterogenous                  +----------+--------+-------+--------+--------------------------------+--------+ CCA Distal87      20             smooth and heterogenous                  +----------+--------+-------+--------+--------------------------------+--------+ ICA Prox  111     24             smooth, heterogenous and                                                  calcific                                 +----------+--------+-------+--------+--------------------------------+--------+ ICA Distal93      31                                                      +----------+--------+-------+--------+--------------------------------+--------+ ECA       153     26                                                      +----------+--------+-------+--------+--------------------------------+--------+ +----------+--------+-------+--------+-------------------+           PSV cm/sEDV cmsDescribeArm Pressure (mmHG) +----------+--------+-------+--------+-------------------+ Subclavian118                                        +----------+--------+-------+--------+-------------------+ +---------+--------+--+--------+--+---------+ VertebralPSV cm/s45EDV cm/s10Antegrade +---------+--------+--+--------+--+---------+  Left Carotid Findings:  +----------+--------+--------+--------+-----------------------+--------+           PSV cm/sEDV cm/sStenosisPlaque Description     Comments +----------+--------+--------+--------+-----------------------+--------+ CCA Prox  99      24              smooth and heterogenous         +----------+--------+--------+--------+-----------------------+--------+ CCA Distal63      17              smooth and heterogenous         +----------+--------+--------+--------+-----------------------+--------+ ICA Prox  74      21              smooth and heterogenous         +----------+--------+--------+--------+-----------------------+--------+ ICA Distal103     36                                     tortuous +----------+--------+--------+--------+-----------------------+--------+ ECA       123     31                                              +----------+--------+--------+--------+-----------------------+--------+ +----------+--------+--------+--------+-------------------+           PSV cm/sEDV cm/sDescribeArm Pressure (mmHG) +----------+--------+--------+--------+-------------------+ [id number]                                         +----------+--------+--------+--------+-------------------+ +---------+--------+--+--------+-+---------+ VertebralPSV cm/s32EDV cm/s9Antegrade +---------+--------+--+--------+-+---------+   Summary: Right Carotid: Velocities in  the right ICA are consistent with a 1-39% stenosis. Left Carotid: Velocities in the left ICA are consistent with a 1-39% stenosis. Vertebrals: Bilateral vertebral arteries demonstrate antegrade flow. *See table(s) above for measurements and observations.     Preliminary    DG ESOPHAGUS W DOUBLE CM (HD)  Result Date: 12/15/2019 CLINICAL DATA:  Recurrent choking episodes. Syncope. EXAM: ESOPHOGRAM / BARIUM SWALLOW / BARIUM TABLET STUDY TECHNIQUE: Combined double contrast and single contrast examination performed using  effervescent crystals, thick barium liquid, and thin barium liquid. The patient was observed with fluoroscopy swallowing a 13 mm barium sulphate tablet. FLUOROSCOPY TIME:  Fluoroscopy Time: 1 minutes and 24 seconds of low-dose pulsed fluoroscopy Radiation Exposure Index (if provided by the fluoroscopic device): 14.6 mGy Number of Acquired Spot Images: 0 COMPARISON:  Chest CT 10/19/2019 FINDINGS: The patient swallowed the barium without difficulty. Rapid sequence imaging of the pharynx in the AP and lateral projections demonstrates no mucosal abnormalities or laryngeal penetration. 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 No esophageal ulceration, mass lesion or stricture identified. There is a small reducible hiatal hernia associated with mild gastroesophageal reflux with the water siphon test. A 13 mm barium tablet was administered and passed with minimal delay into the stomach. IMPRESSION: 1. Small reducible hiatal hernia with mild gastroesophageal reflux. 2. No evidence of esophageal stricture, ulceration or stricture. 3. Aberrant right subclavian artery noted on prior CT without significant mass effect on the esophagus. Electronically Signed   By: Richardean Sale M.D.   On: 12/15/2019 10:18    Microbiology: Recent Results (from the past 240 hour(s))  Wound or Superficial Culture     Status: None (Preliminary result)   Collection Time: 01/03/20 10:52 AM   Specimen: Wound  Result Value Ref Range Status   Specimen Description WOUND BURSA  Final   Special Requests NONE  Final   Gram Stain   Final    RARE WBC PRESENT, PREDOMINANTLY PMN NO ORGANISMS SEEN    Culture   Final    CULTURE REINCUBATED FOR BETTER GROWTH Performed at [facility], [address] [place], [place] [address]    Report Status PENDING  Incomplete  SARS CORONAVIRUS 2 (TAT 6-24 HRS) Nasopharyngeal Nasopharyngeal Swab     Status:  None   Collection Time: 01/03/20 12:57 PM   Specimen: Nasopharyngeal Swab  Result Value Ref Range Status   SARS Coronavirus 2 NEGATIVE NEGATIVE Final    Comment: (NOTE) SARS-CoV-2 target nucleic acids are NOT DETECTED. The SARS-CoV-2 RNA is generally detectable in upper and lower respiratory specimens during the acute phase of infection. Negative results do not preclude SARS-CoV-2 infection, do not rule out co-infections with other pathogens, and should not be used as the sole basis for treatment or other patient management decisions. Negative results must be combined with clinical observations, patient history, and epidemiological information. The expected result is Negative. Fact Sheet for Patients: SugarRoll.be Fact Sheet for Healthcare Providers: https://www.woods-mathews.com/ This test is not yet approved or cleared by the [place] FDA and  has been authorized for detection and/or diagnosis of SARS-CoV-2 by FDA under an Emergency Use Authorization (EUA). This EUA will remain  in effect (meaning this test can be used) for the duration of the COVID-19 declaration under Section 56 4(b)(1) of the Act, 21 U.S.C. section 360bbb-3(b)(1), unless the authorization is terminated or revoked sooner. Performed at [facility], [address] [place],  [place] [address]      Labs: Basic Metabolic Panel: Recent Labs  Lab 01/02/20 2312 01/04/20 0210  NA 137 139  K 3.6 3.8  CL 101 102  CO2 22 26  GLUCOSE 81 92  BUN 21 19  CREATININE 1.05 1.17  CALCIUM 9.2 8.7*   Liver Function Tests: No results for input(s): AST, ALT, ALKPHOS, BILITOT, PROT, ALBUMIN in the last 168 hours. No results for input(s): LIPASE, AMYLASE in the last 168 hours. No results for input(s): AMMONIA in the last 168 hours. CBC: Recent Labs  Lab 01/02/20 2312 01/04/20 0210  WBC 7.9 7.8  HGB 15.8 14.4  HCT 49.0 43.9  MCV 90.7 89.0  PLT 451* 394    Cardiac Enzymes: No results for input(s): CKTOTAL, CKMB, CKMBINDEX, TROPONINI in the last 168 hours. BNP: BNP (last 3 results) No results for input(s): BNP in the last 8760 hours.  ProBNP (last 3 results) No results for input(s): PROBNP in the last 8760 hours.  CBG: Recent Labs  Lab 01/02/20 2313  GLUCAP 74       Signed:  Alma Friendly, MD [facility] 01/04/2020, 4:20 PM

## 2020-01-04 NOTE — Progress Notes (Signed)
PT Cancellation Note  Patient Details Name: Eric Saunders MRN: [id number] DOB: 17-Jun-1956   Cancelled Treatment:    Reason Eval/Treat Not Completed: Patient at procedure or test/unavailable   Was off the floor earlier this morning, when  attempted PT evaluation;   Will follow up later today as time allows;  Otherwise, will follow up for PT tomorrow;   Thank you,  Roney Marion, PT  Acute Rehabilitation Services Pager [phone] Office [phone]     Colletta Maryland 01/04/2020, 12:20 PM

## 2020-01-04 NOTE — Consult Note (Signed)
[facility] Nurse Consult Note: Patient receiving care in [place].  Consult completed remotely after review of record, including photo of wound. Orthopedics has also been consulted and evaluated for this area. Reason for Consult: L arm wound, elbow septic bursitis Wound type: infectious Pressure Injury POA: Yes/No/NA Measurement: Wound bed: see photo Drainage (amount, consistency, odor) see orthopedic note  Periwound: edematous, erythematous per photo Dressing procedure/placement/frequency:  Place Aquacel Kellie Simmering [id number]) over the left elbow wound. Secure with kerlex. Change daily and prn. Monitor the wound area(s) for worsening of condition such as: Signs/symptoms of infection,  Increase in size,  Development of or worsening of odor, Development of pain, or increased pain at the affected locations.  Notify the medical team if any of these develop.  Thank you for the consult.  [facility] nurse will not follow at this time.  Please re-consult the [facility] team if needed.  Val Riles, RN, MSN, CWOCN, CNS-BC, pager [phone]

## 2020-01-04 NOTE — Progress Notes (Signed)
OT Cancellation Note  Patient Details Name: Eric Saunders MRN: [id number] DOB: 21-Oct-1955   Cancelled Treatment:    Reason Eval/Treat Not Completed: Patient at procedure or test/ unavailable; pt currently off floor, will follow up for OT eval as able.  Lou Cal, OT Acute Rehabilitation Services Pager [phone] Office [phone]   Raymondo Band 01/04/2020, 8:20 AM

## 2020-01-04 NOTE — Evaluation (Addendum)
Occupational Therapy Evaluation Patient Details Name: Eric Saunders MRN: [id number] DOB: 1956/01/23 Today's Date: 01/04/2020    History of Present Illness Eric Saunders is a 64 y.o. male with medical history significant of HTN; HLD; panic disorder; and CAD presenting with syncope.  In the last 5 weeks, he has passed out 5 times from coughing.  He had one episode 4/18 within a week of another;  When he falls down, if he isn't sitting it tears up his arms (lacs). Ortho consulted for Elbow wound, concern for infection.   Clinical Impression   This 64 y/o male presents with the above. PTA pt reports independence with ADL and functional mobility. Pt mostly with limitations due to dominant LUE pain (pt reports pain most notably in L shoulder, first digit, and some in L elbow) and limited UE ROM due to pain. Pt reports able to perform ADL tasks but requiring increased time/effort to perform given LUE deficits, has been up walking independently per pt/PT report. Educated pt in gentle ROM/HEP for improving LUE function as well as for pain and edema management. Pt with family present in room who pt reports can assist intermittently PRN after discharge. Recommend pt have close to 24hr supervision initially due to recent coughing/syncopy episodes to maximize his safety during ADL/mobility tasks. Pt is hopeful for d/c today.     Follow Up Recommendations  Outpatient OT(may need outpt OT for LUE)    Equipment Recommendations  None recommended by OT    Recommendations for Other Services       Precautions / Restrictions Precautions Precautions: Other (comment) Precaution Comments: Fall risk if he begins a coughing spell Restrictions Weight Bearing Restrictions: No Other Position/Activity Restrictions: WBAT LUE      Mobility Bed Mobility Overal bed mobility: Independent                Transfers Overall transfer level: Independent                    Balance Overall balance  assessment: No apparent balance deficits (not formally assessed)                                         ADL either performed or assessed with clinical judgement   ADL Overall ADL's : At baseline                                       General ADL Comments: did not perform ADL today but pt verbally reporting only minor difficulty with ADL completion given LUE pain, walking independently with PT earlier today                          Pertinent Vitals/Pain Pain Assessment: Faces Pain Score: 8  Faces Pain Scale: Hurts even more Pain Location: L shoulder and index finger; elbow as well Pain Descriptors / Indicators: Aching(Occasional sharp pain when moves the wrong way) Pain Intervention(s): Limited activity within patient's tolerance;Monitored during session;Repositioned     Hand Dominance Left   Extremity/Trunk Assessment Upper Extremity Assessment Upper Extremity Assessment: LUE deficits/detail LUE Deficits / Details: L hand and finger pain with gross fist-making flexion; decr Pain free shoulder ROM - able to abduct to grossly 90* before painful, shoulder flexion grossly to 100*  before painful, bruised/swollen L elbow with multiple abrasions noted  LUE Sensation: WNL LUE Coordination: decreased fine motor;decreased gross motor(grossly)   Lower Extremity Assessment Lower Extremity Assessment: Defer to PT evaluation       Communication Communication Communication: No difficulties   Cognition Arousal/Alertness: Awake/alert Behavior During Therapy: WFL for tasks assessed/performed Overall Cognitive Status: Within Functional Limits for tasks assessed                                     General Comments       Exercises Exercises: General Upper Extremity General Exercises - Upper Extremity Shoulder Flexion: AROM;Left(within pain free range) Shoulder ABduction: AROM;Left(within pain free range) Elbow Flexion:  AROM;Left Elbow Extension: AROM;Left Wrist Flexion: AROM;Left Wrist Extension: AROM;Left Digit Composite Flexion: AROM;Left(within pain free range) Composite Extension: AROM;Left   Shoulder Instructions      Home Living Family/patient expects to be discharged to:: Private residence Living Arrangements: Spouse/significant other Available Help at Discharge: Family Type of Home: House Home Access: Stairs to enter [employer] of Steps: 6 Entrance Stairs-Rails: Right Home Layout: One level     Bathroom Shower/Tub: Tub/shower unit;Walk-in Psychologist, prison and probation services: Standard     Home Equipment: Shower seat - built in          Prior Functioning/Environment Level of Independence: Independent        Comments: Enjoys working in the yard, goes on neighborhood walks regularly        OT Problem List: Decreased range of motion;Pain;Impaired UE functional use;Increased edema      OT Treatment/Interventions: Self-care/ADL training;Therapeutic exercise;DME and/or AE instruction;Therapeutic activities;Patient/family education;Balance training    OT Goals(Current goals can be found in the care plan section) Acute Rehab OT Goals Patient Stated Goal: Hopes to go home today; wants these episodes to stop OT Goal Formulation: With patient Time For Goal Achievement: 01/18/20 Potential to Achieve Goals: Good  OT Frequency: Min 2X/week   Barriers to D/C:            Co-evaluation              AM-PAC OT "6 Clicks" Daily Activity     Outcome Measure Help from another person eating meals?: None Help from another person taking care of personal grooming?: None Help from another person toileting, which includes using toliet, bedpan, or urinal?: A Little Help from another person bathing (including washing, rinsing, drying)?: A Little Help from another person to put on and taking off regular upper body clothing?: A Little Help from another person to put on and taking off  regular lower body clothing?: A Little 6 Click Score: 20   End of Session Nurse Communication: Mobility status  Activity Tolerance: Patient tolerated treatment well Patient left: in bed;with call bell/phone within reach;with family/visitor present  OT Visit Diagnosis: Pain;History of falling (Z91.81) Pain - Right/Left: Left Pain - part of body: Shoulder;Hand;Arm                Time: [id number] OT Time Calculation (min): 12 min Charges:  OT General Charges $OT Visit: 1 Visit OT Evaluation $OT Eval Moderate Complexity: 1 Mod  Lou Cal, OT Acute Rehabilitation Services Pager [phone] Office [phone]   Raymondo Band 01/04/2020, 3:37 PM

## 2020-01-04 NOTE — Progress Notes (Signed)
  Echocardiogram 2D Echocardiogram has been performed.  Searcy Miyoshi A Stashia Sia 01/04/2020, 8:42 AM

## 2020-01-05 ENCOUNTER — Telehealth: Payer: Self-pay | Admitting: Nurse Practitioner

## 2020-01-05 LAB — AEROBIC CULTURE W GRAM STAIN (SUPERFICIAL SPECIMEN)

## 2020-01-05 NOTE — Telephone Encounter (Signed)
He still need sti be seen

## 2020-01-05 NOTE — Telephone Encounter (Signed)
Please advise 

## 2020-01-06 ENCOUNTER — Ambulatory Visit: Payer: Self-pay | Admitting: Nurse Practitioner

## 2020-01-06 NOTE — Telephone Encounter (Signed)
Left message informing patient that Eric Saunders still needs to evaluate patient. He is scheduled 01/20/2020. It is for a chronic follow up.

## 2020-01-13 DIAGNOSIS — S5002XA Contusion of left elbow, initial encounter: Secondary | ICD-10-CM | POA: Insufficient documentation

## 2020-01-18 ENCOUNTER — Encounter: Payer: Self-pay | Admitting: Gastroenterology

## 2020-01-18 ENCOUNTER — Ambulatory Visit (INDEPENDENT_AMBULATORY_CARE_PROVIDER_SITE_OTHER): Payer: BC Managed Care – PPO | Admitting: Gastroenterology

## 2020-01-18 VITALS — BP 142/80 | HR 81 | Temp 98.5°F | Ht 68.0 in | Wt 196.0 lb

## 2020-01-18 DIAGNOSIS — R05 Cough: Secondary | ICD-10-CM | POA: Diagnosis not present

## 2020-01-18 DIAGNOSIS — F17200 Nicotine dependence, unspecified, uncomplicated: Secondary | ICD-10-CM

## 2020-01-18 DIAGNOSIS — Z8719 Personal history of other diseases of the digestive system: Secondary | ICD-10-CM

## 2020-01-18 DIAGNOSIS — Z87898 Personal history of other specified conditions: Secondary | ICD-10-CM | POA: Diagnosis not present

## 2020-01-18 DIAGNOSIS — K219 Gastro-esophageal reflux disease without esophagitis: Secondary | ICD-10-CM

## 2020-01-18 DIAGNOSIS — H9313 Tinnitus, bilateral: Secondary | ICD-10-CM | POA: Diagnosis not present

## 2020-01-18 DIAGNOSIS — R0989 Other specified symptoms and signs involving the circulatory and respiratory systems: Secondary | ICD-10-CM

## 2020-01-18 DIAGNOSIS — R198 Other specified symptoms and signs involving the digestive system and abdomen: Secondary | ICD-10-CM

## 2020-01-18 DIAGNOSIS — R054 Cough syncope: Secondary | ICD-10-CM

## 2020-01-18 DIAGNOSIS — H9319 Tinnitus, unspecified ear: Secondary | ICD-10-CM

## 2020-01-18 DIAGNOSIS — R55 Syncope and collapse: Secondary | ICD-10-CM

## 2020-01-18 MED ORDER — OMEPRAZOLE 20 MG PO CPDR: 20.0000 mg | DELAYED_RELEASE_CAPSULE | Freq: Two times a day (BID) | ORAL | 4 refills | Status: DC

## 2020-01-18 NOTE — Patient Instructions (Signed)
We have sent the following medications to your pharmacy for you to pick up at your convenience: Omeprazole  We will send referral to Dr. Radene Journey- ENT. Their office will contact you to schedule appointment.   Please keep follow up in 4-6 weeks with Dr. Rush Landmark.   If you are age 62 or older, your body mass index should be between 23-30. Your Body mass index is 29.8 kg/m. If this is out of the aforementioned range listed, please consider follow up with your Primary Care Provider.  If you are age 21 or younger, your body mass index should be between 19-25. Your Body mass index is 29.8 kg/m. If this is out of the aformentioned range listed, please consider follow up with your Primary Care Provider.    Thank you for choosing me and [facility].  Dr. Rush Landmark

## 2020-01-18 NOTE — Progress Notes (Signed)
[facility] VISIT   Primary Care Provider [name], [address] [place] [address] [phone]  Patient Profile: Eric Saunders is a 64 y.o. male with a pmh significant for CAD, hypertension, hyperlipidemia, tobacco use disorder, anxiety/panic disorder, GERD, recurrent idiopathic syncope ?cough syncope.  The patient presents to the [facility] for an evaluation and management of problem(s) noted below:  Problem List 1. History of syncope   2. Tinnitus of both ears   3. Cough syncope syndrome   4. Gastroesophageal reflux disease, unspecified whether esophagitis present   5. Globus sensation   6. Choking episode   7. Tobacco use disorder     History of Present Illness Please see initial consultation note by PA [place] for full details of HPI.  Interval History Patient recently admitted with Syncope.  He coughed and passed out.  Was to be sent and seen by ENT, though the patient got confused and thought that today's visit was with ENT rather than GI.  He apologized for this.  By being on higher dose PPI twice daily, the patient has had improvement in his GERD symptoms.  When truly trying to understand his symptoms it is not clear that he has overt esophageal dysphagia and he does not have overt globus.  No significant changes in bowel habits.  Patient has never had colon cancer screening.  He denies rectal bleeding.  Denies changes in his bowel habits.  GI Review of Systems Positive as above Negative for odynophagia, abdominal pain, nausea, vomiting  Review of Systems General: Denies fevers/chills/unintentional weight loss HEENT: Denies oral lesions Cardiovascular: Denies chest pain/palpitations Pulmonary: Denies shortness of breath Gastroenterological: See HPI Genitourinary: Denies darkened urine Hematological: Denies easy bruising/bleeding Dermatological: Denies jaundice Psychological: Mood is anxious  to find out what the problem is   Medications Current Outpatient Medications  Medication Sig Dispense Refill  . ALPRAZolam (XANAX) 0.5 MG tablet Take 1 tablet (0.5 mg total) by mouth 2 (two) times daily as needed for anxiety. 60 tablet 1  . amLODipine (NORVASC) 10 MG tablet Take 1 tablet (10 mg total) by mouth daily. 90 tablet 1  . aspirin 81 MG tablet Take 81 mg by mouth daily.    [place] Kitchen escitalopram (LEXAPRO) 10 MG tablet Take 1 tablet (10 mg total) by mouth daily. 90 tablet 1  . fenofibrate 160 MG tablet Take 1 tablet (160 mg total) by mouth daily. 90 tablet 1  . fluticasone furoate-vilanterol (BREO ELLIPTA) 100-25 MCG/INH AEPB Inhale 1 puff into the lungs every morning. 1 each 5  . hydrochlorothiazide (HYDRODIURIL) 25 MG tablet Take 1 tablet (25 mg total) by mouth daily. 90 tablet 1  . losartan (COZAAR) 100 MG tablet Take 1 tablet (100 mg total) by mouth daily. 90 tablet 1  . metoprolol succinate (TOPROL-XL) 25 MG 24 hr tablet Take 1 tablet (25 mg total) by mouth daily. 90 tablet 1  . omeprazole (PRILOSEC) 20 MG capsule Take 1 capsule (20 mg total) by mouth 2 (two) times daily before a meal. 60 capsule 4  . pravastatin (PRAVACHOL) 80 MG tablet Take 1 tablet (80 mg total) by mouth daily. (Patient taking differently: Take 40 mg by mouth daily. ) 90 tablet 3  . sildenafil (VIAGRA) 100 MG tablet Take 0.5-1 tablets (50-100 mg total) by mouth daily as needed for erectile dysfunction. 5 tablet 11   No current facility-administered medications for this visit.    Allergies Allergies  Allergen Reactions  . Ace Inhibitors Cough  .  Other     Histories Past Medical History:  Diagnosis Date  . Coronary artery disease   . GERD (gastroesophageal reflux disease)   . Hyperlipemia   . Hypertension   . Panic disorder   . Tobacco abuse    Past Surgical History:  Procedure Laterality Date  . CORONARY ARTERY BYPASS GRAFT N/A 06/17/2014   Procedure: CORONARY ARTERY BYPASS GRAFTING (CABG) X 4, RIGHT  LEG EVH;  Surgeon: Gaye Pollack, MD;  Location: [facility];  Service: Open Heart Surgery;  Laterality: N/A;  . KNEE SURGERY     bilateral arthroscopic  . LEFT HEART CATHETERIZATION WITH CORONARY ANGIOGRAM N/A 06/16/2014   Procedure: LEFT HEART CATHETERIZATION WITH CORONARY ANGIOGRAM;  Surgeon: Burnell Blanks, MD;  Location: [facility] CATH LAB;  Service: Cardiovascular;  Laterality: N/A;  . TEE WITHOUT CARDIOVERSION N/A 06/17/2014   Procedure: TRANSESOPHAGEAL ECHOCARDIOGRAM (TEE);  Surgeon: Gaye Pollack, MD;  Location: [facility];  Service: Open Heart Surgery;  Laterality: N/A;   Social History   Socioeconomic History  . Marital status: Married    Spouse name: Not on file  . Number of children: 2  . Years of education: Not on file  . Highest education level: Not on file  Occupational History  . Occupation: applying for disability  Tobacco Use  . Smoking status: Current Every Day Smoker    Packs/day: 1.50    Years: 30.00    Pack years: 45.00    Types: Cigarettes    Last attempt to quit: 1951    Years since quitting: 70.3  . Smokeless tobacco: Never Used  Substance and Sexual Activity  . Alcohol use: Yes    Alcohol/week: 6.0 standard drinks    Types: 6 Cans of beer per week    Comment: 6 weekly  . Drug use: No  . Sexual activity: Not on file  Other Topics Concern  . Not on file  Social History Narrative  . Not on file   Social Determinants of Health   Financial Resource Strain:   . Difficulty of Paying Living Expenses:   Food Insecurity:   . Worried About Charity fundraiser in the Last Year:   . Arboriculturist in the Last Year:   Transportation Needs:   . Film/video editor (Medical):   [place] Kitchen Lack of Transportation (Non-Medical):   Physical Activity:   . Days of Exercise per Week:   . Minutes of Exercise per Session:   Stress:   . Feeling of Stress :   Social Connections:   . Frequency of Communication with Friends and Family:   . Frequency of Social Gatherings with  Friends and Family:   . Attends Religious Services:   . Active Member of Clubs or Organizations:   . Attends Archivist Meetings:   [place] Kitchen Marital Status:   Intimate Partner Violence:   . Fear of Current or Ex-Partner:   . Emotionally Abused:   [place] Physically Abused:   . Sexually Abused:    Family History  Problem Relation Age of Onset  . Drug abuse Sister   . Healthy Brother   . Diabetes Sister   . Healthy Sister   . Healthy Sister   . Healthy Sister   . Healthy Sister   . Healthy Brother   . Colon cancer Neg Hx   . Esophageal cancer Neg Hx   . Inflammatory bowel disease Neg Hx   . Liver disease Neg Hx   . Pancreatic cancer Neg  Hx   . Rectal cancer Neg Hx   . Stomach cancer Neg Hx    I have reviewed his medical, social, and family history in detail and updated the electronic medical record as necessary.    PHYSICAL EXAMINATION  BP (!) 142/80   Pulse 81   Temp 98.5 F (36.9 C)   Ht 5\' 8"  (1.727 m)   Wt 196 lb (88.9 kg)   SpO2 98%   BMI 29.80 kg/m  Wt Readings from Last 3 Encounters:  01/18/20 196 lb (88.9 kg)  12/10/19 203 lb (92.1 kg)  10/19/19 205 lb (93 kg)  GEN: NAD, appears stated age, doesn't appear chronically ill PSYCH: Cooperative, without pressured speech EYE: Conjunctivae pink, sclerae anicteric ENT: MMM CV: Nontachycardic RESP: No audible wheezing GI: Soft, nontender, nondistended, without rebound or guarding  MSK/EXT: No lower extremity edema SKIN: No jaundice NEURO:  Alert & Oriented x 3, no focal deficits   REVIEW OF DATA  I reviewed the following data at the time of this encounter:  GI Procedures and Studies  No relevant GI studies to review  Laboratory Studies  Reviewed those in epic  Imaging Studies  March 2021 Barium Esophagram IMPRESSION: 1. Small reducible hiatal hernia with mild gastroesophageal reflux. 2. No evidence of esophageal stricture, ulceration or stricture. 3. 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   ASSESSMENT  Mr. Weyer is a 64 y.o. male with a pmh significant for CAD, hypertension, hyperlipidemia, tobacco use disorder, anxiety/panic disorder, GERD, recurrent idiopathic syncope ?cough syncope.  The patient is seen today for evaluation and management of:  1. History of syncope   2. Tinnitus of both ears   3. Cough syncope syndrome   4. Gastroesophageal reflux disease, unspecified whether esophagitis present   5. Globus sensation   6. Choking episode   7. Tobacco use disorder    The patient is hemodynamically stable at this time.  Etiology of his symptoms do not seem to be GERD related overall as his GERD symptoms have improved but he continues to have cough and subsequent syncope.  Is not clear that he is actually having overt dysphagia or globus but rather seems almost as if he is having aspiration episodes at times.  He continues to smoke however.  I am all right with continuing his current dosing of 20 mg twice daily Prilosec.  We certainly can increase him to 40 twice a day and see if that makes any further difference but not clear to me that it well and his actual pyrosis is improved on twice daily.  I do think considering an upper endoscopy and potential empiric dilation is not unreasonable but it is not clear to me that his symptoms are esophageal in origin.  He would like to hold on that for now until he is seen by ENT.  He greatly apologized thinking that today was an ENT evaluation rather than a follow-up from a GI perspective.  We also briefly discussed the role of colon cancer screening which he may be amenable to but wants to try to sort out these other issues first.  We will plan to replace a referral to ENT, Dr. Lucia Gaskins, for further evaluation and follow-up.  He will return in approximately 6 weeks and we will discuss moving forward with endoscopic evaluation from above and below at that time.  All patient questions were  answered, to the best of my ability, and the patient agrees to  the aforementioned plan of action with follow-up as indicated.   PLAN  Continue omeprazole 20 mg twice daily Replaced referral to ENT for evaluation of ?Cough syncope We will consider diagnostic endoscopy with empiric dilation though seems less likely to be an issue leading to his recurrent syncope Colonoscopy versus Cologuard discussion continues for colon cancer screening in future he is not ready to decide on that now   Orders Placed This Encounter  Procedures  . Ambulatory referral to ENT    New Prescriptions   No medications on file   Modified Medications   Modified Medication Previous Medication   OMEPRAZOLE (PRILOSEC) 20 MG CAPSULE omeprazole (PRILOSEC) 20 MG capsule      Take 1 capsule (20 mg total) by mouth 2 (two) times daily before a meal.    Take 1 capsule (20 mg total) by mouth in the morning and at bedtime.    Planned Follow Up No follow-ups on file.   Total Time in Face-to-Face and in Coordination of Care for patient including independent/personal interpretation/review of prior testing, medical history, examination, medication adjustment, communicating results with the patient directly, and documentation with the EHR is 25 minutes.   [name], MD [facility] # [phone]

## 2020-01-19 ENCOUNTER — Encounter: Payer: Self-pay | Admitting: Gastroenterology

## 2020-01-19 DIAGNOSIS — R054 Cough syncope: Secondary | ICD-10-CM | POA: Insufficient documentation

## 2020-01-19 DIAGNOSIS — R198 Other specified symptoms and signs involving the digestive system and abdomen: Secondary | ICD-10-CM | POA: Insufficient documentation

## 2020-01-19 DIAGNOSIS — R0989 Other specified symptoms and signs involving the circulatory and respiratory systems: Secondary | ICD-10-CM | POA: Insufficient documentation

## 2020-01-19 DIAGNOSIS — R05 Cough: Secondary | ICD-10-CM | POA: Insufficient documentation

## 2020-01-19 DIAGNOSIS — H9313 Tinnitus, bilateral: Secondary | ICD-10-CM | POA: Insufficient documentation

## 2020-01-19 DIAGNOSIS — Z87898 Personal history of other specified conditions: Secondary | ICD-10-CM | POA: Insufficient documentation

## 2020-01-19 DIAGNOSIS — R55 Syncope and collapse: Secondary | ICD-10-CM | POA: Insufficient documentation

## 2020-01-19 DIAGNOSIS — K219 Gastro-esophageal reflux disease without esophagitis: Secondary | ICD-10-CM | POA: Insufficient documentation

## 2020-01-20 ENCOUNTER — Encounter: Payer: Self-pay | Admitting: Nurse Practitioner

## 2020-01-20 ENCOUNTER — Other Ambulatory Visit: Payer: Self-pay

## 2020-01-20 ENCOUNTER — Ambulatory Visit: Payer: BC Managed Care – PPO | Admitting: Nurse Practitioner

## 2020-01-20 VITALS — BP 139/74 | HR 59 | Temp 97.9°F | Ht 68.0 in | Wt 197.8 lb

## 2020-01-20 DIAGNOSIS — R054 Cough syncope: Secondary | ICD-10-CM

## 2020-01-20 DIAGNOSIS — I25810 Atherosclerosis of coronary artery bypass graft(s) without angina pectoris: Secondary | ICD-10-CM

## 2020-01-20 DIAGNOSIS — K219 Gastro-esophageal reflux disease without esophagitis: Secondary | ICD-10-CM

## 2020-01-20 DIAGNOSIS — F410 Panic disorder [episodic paroxysmal anxiety] without agoraphobia: Secondary | ICD-10-CM

## 2020-01-20 DIAGNOSIS — I10 Essential (primary) hypertension: Secondary | ICD-10-CM | POA: Diagnosis not present

## 2020-01-20 DIAGNOSIS — R0989 Other specified symptoms and signs involving the circulatory and respiratory systems: Secondary | ICD-10-CM

## 2020-01-20 DIAGNOSIS — R05 Cough: Secondary | ICD-10-CM

## 2020-01-20 DIAGNOSIS — R198 Other specified symptoms and signs involving the digestive system and abdomen: Secondary | ICD-10-CM

## 2020-01-20 DIAGNOSIS — R55 Syncope and collapse: Secondary | ICD-10-CM

## 2020-01-20 DIAGNOSIS — J449 Chronic obstructive pulmonary disease, unspecified: Secondary | ICD-10-CM | POA: Diagnosis not present

## 2020-01-20 DIAGNOSIS — E785 Hyperlipidemia, unspecified: Secondary | ICD-10-CM

## 2020-01-20 DIAGNOSIS — F411 Generalized anxiety disorder: Secondary | ICD-10-CM

## 2020-01-20 DIAGNOSIS — E669 Obesity, unspecified: Secondary | ICD-10-CM

## 2020-01-20 MED ORDER — HYDROCHLOROTHIAZIDE 25 MG PO TABS: 25.0000 mg | ORAL_TABLET | Freq: Every day | ORAL | 1 refills | Status: DC

## 2020-01-20 MED ORDER — ALPRAZOLAM 0.5 MG PO TABS: 0.5000 mg | ORAL_TABLET | Freq: Two times a day (BID) | ORAL | 2 refills | Status: DC | PRN

## 2020-01-20 MED ORDER — AMLODIPINE BESYLATE 10 MG PO TABS: 10.0000 mg | ORAL_TABLET | Freq: Every day | ORAL | 1 refills | Status: DC

## 2020-01-20 MED ORDER — LOSARTAN POTASSIUM 100 MG PO TABS: 100.0000 mg | ORAL_TABLET | Freq: Every day | ORAL | 1 refills | Status: DC

## 2020-01-20 MED ORDER — METOPROLOL SUCCINATE ER 25 MG PO TB24: 25.0000 mg | ORAL_TABLET | Freq: Every day | ORAL | 1 refills | Status: DC

## 2020-01-20 MED ORDER — FENOFIBRATE 160 MG PO TABS: 160.0000 mg | ORAL_TABLET | Freq: Every day | ORAL | 1 refills | Status: DC

## 2020-01-20 MED ORDER — ESCITALOPRAM OXALATE 10 MG PO TABS: 10.0000 mg | ORAL_TABLET | Freq: Every day | ORAL | 1 refills | Status: DC

## 2020-01-20 NOTE — Patient Instructions (Signed)
Bradycardia, Adult Bradycardia is a slower-than-normal heartbeat. A normal resting heart rate for an adult ranges from 60 to 100 beats per minute. With bradycardia, the resting heart rate is less than 60 beats per minute. Bradycardia can prevent enough oxygen from reaching certain areas of your body when you are active. It can be serious if it keeps enough oxygen from reaching your brain and other parts of your body. Bradycardia is not a problem for everyone. For some healthy adults, a slow resting heart rate is normal. What are the causes? This condition may be caused by:  A problem with the heart, including: ? A problem with the heart's electrical system, such as a heart block. With a heart block, electrical signals between the chambers of the heart are partially or completely blocked, so they are not able to work as they should. ? A problem with the heart's natural pacemaker (sinus node). ? Heart disease. ? A heart attack. ? Heart damage. ? Lyme disease. ? A heart infection. ? A heart condition that is present at birth (congenital heart defect).  Certain medicines that treat heart conditions.  Certain conditions, such as hypothyroidism and obstructive sleep apnea.  Problems with the balance of chemicals and other substances, like potassium, in the blood.  Trauma.  Radiation therapy. What increases the risk? You are more likely to develop this condition if you:  Are age 65 or older.  Have high blood pressure (hypertension), high cholesterol (hyperlipidemia), or diabetes.  Drink heavily, use tobacco or nicotine products, or use drugs. What are the signs or symptoms? Symptoms of this condition include:  Light-headedness.  Feeling faint or fainting.  Fatigue and weakness.  Trouble with activity or exercise.  Shortness of breath.  Chest pain (angina).  Drowsiness.  Confusion.  Dizziness. How is this diagnosed? This condition may be diagnosed based on:  Your  symptoms.  Your medical history.  A physical exam. During the exam, your health care provider will listen to your heartbeat and check your pulse. To confirm the diagnosis, your health care provider may order tests, such as:  Blood tests.  An electrocardiogram (ECG). This test records the heart's electrical activity. The test can show how fast your heart is beating and whether the heartbeat is steady.  A test in which you wear a portable device (event recorder or Holter monitor) to record your heart's electrical activity while you go about your day.  Anexercise test. How is this treated? Treatment for this condition depends on the cause of the condition and how severe your symptoms are. Treatment may involve:  Treatment of the underlying condition.  Changing your medicines or how much medicine you take.  Having a small, battery-operated device called a pacemaker implanted under the skin. When bradycardia occurs, this device can be used to increase your heart rate and help your heart beat in a regular rhythm. Follow these instructions at home: Lifestyle   Manage any health conditions that contribute to bradycardia as told by your health care provider.  Follow a heart-healthy diet. A nutrition specialist (dietitian) can help educate you about healthy food options and changes.  Follow an exercise program that is approved by your health care provider.  Maintain a healthy weight.  Try to reduce or manage your stress, such as with yoga or meditation. If you need help reducing stress, ask your health care provider.  Do not use any products that contain nicotine or tobacco, such as cigarettes, e-cigarettes, and chewing tobacco. If you need help   quitting, ask your health care provider.  Do not use illegal drugs.  Limit alcohol intake to no more than 1 drink a day for nonpregnant women and 2 drinks a day for men. Be aware of how much alcohol is in your drink. In the [place] one drink  equals one 12 oz bottle of beer (355 mL), one 5 oz glass of wine (148 mL), or one 1 oz glass of hard liquor (44 mL). General instructions  Take over-the-counter and prescription medicines only as told by your health care provider.  Keep all follow-up visits as told by your health care provider. This is important. How is this prevented? In some cases, bradycardia may be prevented by:  Treating underlying medical problems.  Stopping behaviors or medicines that can trigger the condition. Contact a health care provider if you:  Feel light-headed or dizzy.  Almost faint.  Feel weak or are easily fatigued during physical activity.  Experience confusion or have memory problems. Get help right away if:  You faint.  You have: ? An irregular heartbeat (palpitations). ? Chest pain. ? Trouble breathing. Summary  Bradycardia is a slower-than-normal heartbeat. With bradycardia, the resting heart rate is less than 60 beats per minute.  Treatment for this condition depends on the cause.  Manage any health conditions that contribute to bradycardia as told by your health care provider.  Do not use any products that contain nicotine or tobacco, such as cigarettes, e-cigarettes, and chewing tobacco, and limit alcohol intake.  Keep all follow-up visits as told by your health care provider. This is important. This information is not intended to replace advice given to you by your health care provider. Make sure you discuss any questions you have with your health care provider. Document Revised: 03/16/2018 Document Reviewed: 02/11/2018 [employer]vier Patient Education  2020 [employer]  

## 2020-01-20 NOTE — Progress Notes (Signed)
Cardiology Office Note   Date:  01/21/2020   ID:  [name], [name] [birth year], MRN [id number]  PCP:  [name], FNP  Cardiologist:   Minus Breeding, MD   Chief Complaint  Patient presents with  . Loss of Consciousness     History of Present Illness: Eric Saunders is a 64 y.o. male who presents for followup after CABG.  Last year he had some increased dyspnea.  I sent him for a POET (Plain Old Exercise Treadmill) and he had no evidence of ischemia.   He did have a bit of a hypertensive blood pressure response.    Since I last saw him he was hospitalized last month with syncope.  He has had about five or six episodes in the last 6 months.  These are always associated with cough.  I reviewed these records.  He does smoke but he does not report the cough to be a smoker's cough.  In fact in the office he did have an episode where he was somewhat choking it seemed like with possible reflux and then developed some coughing.  He says he starts coughing and he loses consciousness.  He feels like he is choking.  He feels exhausted afterwards.  His episodes may last for about 30 seconds.  There is no apparent seizure activity.  He wonders if it could be panic but he does take Xanax and has really helped.  He actually fractured his elbow at some point perhaps with one of these events.  He had an extensive evaluation in the hospital.  He had an EEG.  Neurology was  EEG was negative.  MRI brain was unremarkable.  Carotid Dopplers were unremarkable.  His COPD was treated.  He was counseled on his smoking.  He followed up with GI.  I reviewed these notes for this visit as well.  They have treated him with Prilosec twice daily.  He has been referred to ENT.  He is not reporting chest discomfort, neck or arm discomfort.  He has not had any palpitations..  He has no new shortness of breath, PND or orthopnea.  Past Medical History:  Diagnosis Date  . Coronary artery disease   . GERD  (gastroesophageal reflux disease)   . Hyperlipemia   . Hypertension   . Panic disorder   . Tobacco abuse     Past Surgical History:  Procedure Laterality Date  . CORONARY ARTERY BYPASS GRAFT N/A 06/17/2014   Procedure: CORONARY ARTERY BYPASS GRAFTING (CABG) X 4, RIGHT LEG EVH;  Surgeon: Gaye Pollack, MD;  Location: [place] OR;  Service: Open Heart Surgery;  Laterality: N/A;  . KNEE SURGERY     bilateral arthroscopic  . LEFT HEART CATHETERIZATION WITH CORONARY ANGIOGRAM N/A 06/16/2014   Procedure: LEFT HEART CATHETERIZATION WITH CORONARY ANGIOGRAM;  Surgeon: Burnell Blanks, MD;  Location: [facility] CATH LAB;  Service: Cardiovascular;  Laterality: N/A;  . TEE WITHOUT CARDIOVERSION N/A 06/17/2014   Procedure: TRANSESOPHAGEAL ECHOCARDIOGRAM (TEE);  Surgeon: Gaye Pollack, MD;  Location: [place];  Service: Open Heart Surgery;  Laterality: N/A;     Current Outpatient Medications  Medication Sig Dispense Refill  . ALPRAZolam (XANAX) 0.5 MG tablet Take 1 tablet (0.5 mg total) by mouth 2 (two) times daily as needed for anxiety. 60 tablet 2  . amLODipine (NORVASC) 10 MG tablet Take 1 tablet (10 mg total) by mouth daily. 90 tablet 1  . aspirin 81 MG tablet Take 81 mg by mouth daily.    Marland Kitchen  escitalopram (LEXAPRO) 10 MG tablet Take 1 tablet (10 mg total) by mouth daily. 90 tablet 1  . fenofibrate 160 MG tablet Take 1 tablet (160 mg total) by mouth daily. 90 tablet 1  . fluticasone furoate-vilanterol (BREO ELLIPTA) 100-25 MCG/INH AEPB Inhale 1 puff into the lungs every morning. 1 each 5  . hydrochlorothiazide (HYDRODIURIL) 25 MG tablet Take 1 tablet (25 mg total) by mouth daily. 90 tablet 1  . losartan (COZAAR) 100 MG tablet Take 1 tablet (100 mg total) by mouth daily. 90 tablet 1  . metoprolol succinate (TOPROL-XL) 25 MG 24 hr tablet Take 1 tablet (25 mg total) by mouth daily. 90 tablet 1  . omeprazole (PRILOSEC) 20 MG capsule Take 1 capsule (20 mg total) by mouth 2 (two) times daily before a meal. 60  capsule 4  . pravastatin (PRAVACHOL) 80 MG tablet Take 80 mg by mouth daily. Take 1/2 tablet daily    . sildenafil (VIAGRA) 100 MG tablet Take 0.5-1 tablets (50-100 mg total) by mouth daily as needed for erectile dysfunction. 5 tablet 11   No current facility-administered medications for this visit.    Allergies:   Ace inhibitors and Other    ROS:  Please see the history of present illness.   Otherwise, review of systems are positive for none.   All other systems are reviewed and negative.    PHYSICAL EXAM: VS:  BP (!) 148/82   Pulse 69   Ht 5\' 8"  (1.727 m)   Wt 197 lb 3.2 oz (89.4 kg)   SpO2 99%   BMI 29.98 kg/m  , BMI Body mass index is 29.98 kg/m. GENERAL:  Well appearing NECK:  No jugular venous distention, waveform within normal limits, carotid upstroke brisk and symmetric, no bruits, no thyromegaly LUNGS:  Clear to auscultation bilaterally CHEST:  Well healed sternotomy scar. HEART:  PMI not displaced or sustained,S1 and S2 within normal limits, no S3, no S4, no clicks, no rubs, no murmurs ABD:  Flat, positive bowel sounds normal in frequency in pitch, no bruits, no rebound, no guarding, no midline pulsatile mass, no hepatomegaly, no splenomegaly EXT:  2 plus pulses throughout, trace leg edema, no cyanosis no clubbing   EKG:  EKG is  ordered today. The ekg ordered today demonstrates sinus rhythm, rate 69, axis within normal limits,   Recent Labs: 04/06/2019: ALT 20 01/03/2020: TSH 0.841 01/04/2020: BUN 19; Creatinine, Ser 1.17; Hemoglobin 14.4; Platelets 394; Potassium 3.8; Sodium 139    Lipid Panel    Component Value Date/Time   CHOL 167 01/04/2020 0210   CHOL 205 (H) 04/06/2019 0908   CHOL 166 12/28/2012 0932   TRIG 318 (H) 01/04/2020 0210   TRIG 157 (H) 11/23/2014 1549   TRIG 129 12/28/2012 0932   HDL 30 (L) 01/04/2020 0210   HDL 43 04/06/2019 0908   HDL 44 11/23/2014 1549   HDL 42 12/28/2012 0932   CHOLHDL 5.6 01/04/2020 0210   VLDL 64 (H) 01/04/2020 0210    LDLCALC 73 01/04/2020 0210   LDLCALC 127 (H) 04/06/2019 0908   LDLCALC 90 07/06/2014 0000   LDLCALC 98 12/28/2012 0932      Wt Readings from Last 3 Encounters:  01/21/20 197 lb 3.2 oz (89.4 kg)  01/20/20 197 lb 12.8 oz (89.7 kg)  01/18/20 196 lb (88.9 kg)      Other studies Reviewed: Additional studies/ records that were reviewed today include: Extensive review of hospital records.  Review of the above records demonstrates:  Please  see elsewhere in the note.     ASSESSMENT AND PLAN:  CAD/CABG:    He had a negative treadmill test last year.  He is not having any symptoms consistent with ischemia.  No change in therapy.    SYNCOPE: This is clearly related to coughing.  I witnessed some of the cough in the office and he really does not sound consistent with smoker's cough though I think he has to stop smoking in order to really sort this out.  I agree with the ENT follow-up I will also talk with GI to see if we can do a more presumptive aggressive management for a possible GI source for his coughing.  While I do not think this is an arrhythmia other than vagal events related to cough syncope I will put on a monitor.  TOBACCO:   He has been counseled like everybody to stop smoking.  He has had a prescription for Chantix and has not wanted to use this.  DYSLIPIDEMIA:   LDL was 73.  HDL 30.  No change in therapy.   COUGH: As above.  HTN:  The blood pressure is slightly elevated.  He should keep a blood pressure diary.  No change in therapy.   Current medicines are reviewed at length with the patient today.  The patient does not have concerns regarding medicines.  The following changes have been made:  no change  Labs/ tests ordered today include: None  Orders Placed This Encounter  Procedures  . CARDIAC EVENT MONITOR  . EKG 12-Lead     Disposition:   FU with me in 3 months.    Signed, Minus Breeding, MD  01/21/2020 9:44 AM    [facility]

## 2020-01-20 NOTE — Progress Notes (Signed)
Subjective:    Patient ID: Eric Saunders, male    DOB: 01-Dec-1955, 64 y.o.   MRN: [id number]   Chief Complaint: Medical Management of Chronic Issues (Patient states that he has been having coughing spells that have been ongoing.)    HPI:  1. Essential hypertension No c/o chet pain, Sob or headache. Does not check blood pressure at home. BP Readings from Last 3 Encounters:  01/20/20 139/74  01/18/20 (!) 142/80  01/04/20 (!) 150/82     2. Coronary artery disease involving autologous vein coronary bypass graft without angina pectoris Patient saw DR. Hochrien on 10/19/19. Dr.Hochrien made no change to his meds and did not  feel that his current coughing issues had anything to do with his heart.  3. Chronic obstructive pulmonary disease, unspecified COPD type (HCC) Constant cough. Has caused several syncopial episode. He has not seen lung specialist as of yet. Uses his BREO daily  4. Gastroesophageal reflux disease, unspecified whether esophagitis present He is on omepraqzole daily and denies any heartburn.  5. GAD (generalized anxiety disorder) Is on xanax aBID and says he till needs BID.  GAD 7 : Generalized Anxiety Score 10/07/2019  Nervous, Anxious, on Edge 1  Control/stop worrying 1  Worry too much - different things 1  Trouble relaxing 0  Restless 0  Easily annoyed or irritable 2  Afraid - awful might happen 0  Total GAD 7 Score 5  Anxiety Difficulty Somewhat difficult      6. Hyperlipidemia with target LDL less than 100 Somewhat watches his diet. Not much exercise. Lab Results  Component Value Date   CHOL 167 01/04/2020   HDL 30 (L) 01/04/2020   LDLCALC 73 01/04/2020   TRIG 318 (H) 01/04/2020   CHOLHDL 5.6 01/04/2020    7. Globus sensation He says he feels like he has something in the back of his throat.  8. Cough syncope syndrome This occur when he feels there is something in his throat. He will start coughing and pass out at times. He was in the hospital  everal week ago and they ran every test possible and they have ot been able to determine the cause of these near syncopial/syncopial episode.  9. Obesity (BMI 30.0-34.9) No recent weight changes Wt Readings from Last 3 Encounters:  01/20/20 197 lb 12.8 oz (89.7 kg)  01/18/20 196 lb (88.9 kg)  12/10/19 203 lb (92.1 kg)   BMI Readings from Last 3 Encounters:  01/20/20 30.08 kg/m  01/18/20 29.80 kg/m  12/10/19 30.87 kg/m       Outpatient Encounter Medications as of 01/20/2020  Medication Sig  . ALPRAZolam (XANAX) 0.5 MG tablet Take 1 tablet (0.5 mg total) by mouth 2 (two) times daily as needed for anxiety.  Marland Kitchen amLODipine (NORVASC) 10 MG tablet Take 1 tablet (10 mg total) by mouth daily.  Marland Kitchen aspirin 81 MG tablet Take 81 mg by mouth daily.  Marland Kitchen escitalopram (LEXAPRO) 10 MG tablet Take 1 tablet (10 mg total) by mouth daily.  . fenofibrate 160 MG tablet Take 1 tablet (160 mg total) by mouth daily.  . fluticasone furoate-vilanterol (BREO ELLIPTA) 100-25 MCG/INH AEPB Inhale 1 puff into the lungs every morning.  . hydrochlorothiazide (HYDRODIURIL) 25 MG tablet Take 1 tablet (25 mg total) by mouth daily.  Marland Kitchen losartan (COZAAR) 100 MG tablet Take 1 tablet (100 mg total) by mouth daily.  . metoprolol succinate (TOPROL-XL) 25 MG 24 hr tablet Take 1 tablet (25 mg total) by mouth daily.  Marland Kitchen  omeprazole (PRILOSEC) 20 MG capsule Take 1 capsule (20 mg total) by mouth 2 (two) times daily before a meal.  . pravastatin (PRAVACHOL) 80 MG tablet Take 1 tablet (80 mg total) by mouth daily. (Patient taking differently: Take 40 mg by mouth daily. )  . sildenafil (VIAGRA) 100 MG tablet Take 0.5-1 tablets (50-100 mg total) by mouth daily as needed for erectile dysfunction.   No facility-administered encounter medications on file as of 01/20/2020.    Past Surgical History:  Procedure Laterality Date  . CORONARY ARTERY BYPASS GRAFT N/A 06/17/2014   Procedure: CORONARY ARTERY BYPASS GRAFTING (CABG) X 4, RIGHT LEG EVH;   Surgeon: Gaye Pollack, MD;  Location: [place] OR;  Service: Open Heart Surgery;  Laterality: N/A;  . KNEE SURGERY     bilateral arthroscopic  . LEFT HEART CATHETERIZATION WITH CORONARY ANGIOGRAM N/A 06/16/2014   Procedure: LEFT HEART CATHETERIZATION WITH CORONARY ANGIOGRAM;  Surgeon: Burnell Blanks, MD;  Location: [facility] CATH LAB;  Service: Cardiovascular;  Laterality: N/A;  . TEE WITHOUT CARDIOVERSION N/A 06/17/2014   Procedure: TRANSESOPHAGEAL ECHOCARDIOGRAM (TEE);  Surgeon: Gaye Pollack, MD;  Location: [place];  Service: Open Heart Surgery;  Laterality: N/A;    Family History  Problem Relation Age of Onset  . Drug abuse Sister   . Healthy Brother   . Diabetes Sister   . Healthy Sister   . Healthy Sister   . Healthy Sister   . Healthy Sister   . Healthy Brother   . Colon cancer Neg Hx   . Esophageal cancer Neg Hx   . Inflammatory bowel disease Neg Hx   . Liver disease Neg Hx   . Pancreatic cancer Neg Hx   . Rectal cancer Neg Hx   . Stomach cancer Neg Hx     New complaints: -Ringing in ears- has a referral in for ENT - fell everal weeks ago and fractured his elbow. Became infected and just finihed antibiotic. Social history: Live with wife  Controlled substance contract: 04/12/19    Review of Systems  Constitutional: Negative for diaphoresis.  Eyes: Negative for pain.  Respiratory: Positive for cough. Negative for shortness of breath.   Cardiovascular: Negative for chest pain, palpitations and leg swelling.  Gastrointestinal: Negative for abdominal pain.  Endocrine: Negative for polydipsia.  Skin: Negative for rash.  Neurological: Negative for dizziness, weakness and headaches.  Hematological: Does not bruise/bleed easily.  All other systems reviewed and are negative.      Objective:   Physical Exam Vitals and nursing note reviewed.  Constitutional:      Appearance: Normal appearance. He is well-developed.  HENT:     Head: Normocephalic.     Nose: Nose  normal.  Eyes:     Pupils: Pupils are equal, round, and reactive to light.  Neck:     Thyroid: No thyroid mass or thyromegaly.     Vascular: No carotid bruit or JVD.     Trachea: Phonation normal.  Cardiovascular:     Rate and Rhythm: Normal rate and regular rhythm.  Pulmonary:     Effort: Pulmonary effort is normal. No respiratory distress.     Breath sounds: Normal breath sounds.  Abdominal:     General: Bowel sounds are normal.     Palpations: Abdomen is soft.     Tenderness: There is no abdominal tenderness.  Musculoskeletal:        General: Normal range of motion.     Cervical back: Normal range of motion and  neck supple.     Comments: Left elbow olecranon I mild erthematous but were cool to touch.  Lymphadenopathy:     Cervical: No cervical adenopathy.  Skin:    General: Skin is warm and dry.  Neurological:     Mental Status: He is alert and oriented to person, place, and time.  Psychiatric:        Behavior: Behavior normal.        Thought Content: Thought content normal.        Judgment: Judgment normal.    Blood pressure 139/74, pulse (!) 59, temperature 97.9 F (36.6 C), temperature source Temporal, height 5\' 8"  (1.727 m), weight 197 lb 12.8 oz (89.7 kg), SpO2 99 %.      Assessment & Plan:  Eric Saunders comes in today with chief complaint of Medical Management of Chronic Issues (Patient states that he has been having coughing spells that have been ongoing.)   Diagnosis and orders addressed:  1. Essential hypertension Low sodium diet - hydrochlorothiazide (HYDRODIURIL) 25 MG tablet; Take 1 tablet (25 mg total) by mouth daily.  Dispense: 90 tablet; Refill: 1 - losartan (COZAAR) 100 MG tablet; Take 1 tablet (100 mg total) by mouth daily.  Dispense: 90 tablet; Refill: 1 - amLODipine (NORVASC) 10 MG tablet; Take 1 tablet (10 mg total) by mouth daily.  Dispense: 90 tablet; Refill: 1  2. Coronary artery disease involving autologous vein coronary bypass graft  without angina pectoris Keep every 13month follow up with cardiology - metoprolol succinate (TOPROL-XL) 25 MG 24 hr tablet; Take 1 tablet (25 mg total) by mouth daily.  Dispense: 90 tablet; Refill: 1  3. Chronic obstructive pulmonary disease, unspecified COPD type ([place]) Continue BREO dailly  4. Gastroesophageal reflux disease, unspecified whether esophagitis present Avoid spicy foods Do not eat 2 hours prior to bedtime   5. GAD (generalized anxiety disorder) tre management  6. Hyperlipidemia with target LDL less than 100 Low fat diet - fenofibrate 160 MG tablet; Take 1 tablet (160 mg total) by mouth daily.  Dispense: 90 tablet; Refill: 1  7. Globus sensation Keep appt with ENT  8. Cough syncope syndrome Again keep appointment with ENT  9. Obesity (BMI 30.0-34.9) Discussed diet and exercise for person with BMI >25 Will recheck weight in 3-6 months  10. Anxiety attack - escitalopram (LEXAPRO) 10 MG tablet; Take 1 tablet (10 mg total) by mouth daily.  Dispense: 90 tablet; Refill: 1 - ALPRAZolam (XANAX) 0.5 MG tablet; Take 1 tablet (0.5 mg total) by mouth 2 (two) times daily as needed for anxiety.  Dispense: 60 tablet; Refill: 2   Labs pending Health Maintenance reviewed Diet and exercise encouraged  Follow up plan: 3 months   Mary-Margaret Hassell Done, FNP

## 2020-01-21 ENCOUNTER — Encounter: Payer: Self-pay | Admitting: Cardiology

## 2020-01-21 ENCOUNTER — Ambulatory Visit: Payer: BC Managed Care – PPO | Admitting: Cardiology

## 2020-01-21 ENCOUNTER — Telehealth: Payer: Self-pay | Admitting: Radiology

## 2020-01-21 VITALS — BP 148/82 | HR 69 | Ht 68.0 in | Wt 197.2 lb

## 2020-01-21 DIAGNOSIS — E785 Hyperlipidemia, unspecified: Secondary | ICD-10-CM | POA: Diagnosis not present

## 2020-01-21 DIAGNOSIS — R05 Cough: Secondary | ICD-10-CM

## 2020-01-21 DIAGNOSIS — R0609 Other forms of dyspnea: Secondary | ICD-10-CM

## 2020-01-21 DIAGNOSIS — Z7189 Other specified counseling: Secondary | ICD-10-CM

## 2020-01-21 DIAGNOSIS — R059 Cough, unspecified: Secondary | ICD-10-CM

## 2020-01-21 DIAGNOSIS — R0600 Dyspnea, unspecified: Secondary | ICD-10-CM

## 2020-01-21 DIAGNOSIS — I10 Essential (primary) hypertension: Secondary | ICD-10-CM

## 2020-01-21 DIAGNOSIS — I2510 Atherosclerotic heart disease of native coronary artery without angina pectoris: Secondary | ICD-10-CM | POA: Diagnosis not present

## 2020-01-21 DIAGNOSIS — Z720 Tobacco use: Secondary | ICD-10-CM | POA: Diagnosis not present

## 2020-01-21 DIAGNOSIS — R55 Syncope and collapse: Secondary | ICD-10-CM

## 2020-01-21 DIAGNOSIS — R002 Palpitations: Secondary | ICD-10-CM

## 2020-01-21 NOTE — Patient Instructions (Signed)
Medication Instructions:  No changes *If you need a refill on your cardiac medications before your next appointment, please call your pharmacy*  Lab Work: None ordered this visit  Testing/Procedures: Your physician has recommended that you wear an event monitor. Event monitors are medical devices that record the heart's electrical activity. Doctors most often [place] these monitors to diagnose arrhythmias. Arrhythmias are problems with the speed or rhythm of the heartbeat. The monitor is a small, portable device. You can wear one while you do your normal daily activities. This is usually used to diagnose what is causing palpitations/syncope (passing out).  Follow-Up: At [facility], you and your health needs are our priority.  As part of our continuing mission to provide you with exceptional heart care, we have created designated Provider Care Teams.  These Care Teams include your primary Cardiologist (physician) and Advanced Practice Providers (APPs -  Physician Assistants and Nurse Practitioners) who all work together to provide you with the care you need, when you need it.  Your next appointment:   3 month(s) in [place]  The format for your next appointment:   In Person  Provider:   Minus Breeding, MD  Other Instructions Someone will call to verify your address and then mail you the heart monitor. It will take 7-10 days to arrive. It can be placed by staff at [address] if there are any issues.

## 2020-01-21 NOTE — Telephone Encounter (Signed)
Enrolled patient for a 30 day [employer] Event montor to be mailed to patients home.

## 2020-01-26 ENCOUNTER — Encounter (INDEPENDENT_AMBULATORY_CARE_PROVIDER_SITE_OTHER): Payer: BC Managed Care – PPO

## 2020-01-26 DIAGNOSIS — R0600 Dyspnea, unspecified: Secondary | ICD-10-CM | POA: Diagnosis not present

## 2020-01-26 DIAGNOSIS — R002 Palpitations: Secondary | ICD-10-CM | POA: Diagnosis not present

## 2020-02-01 ENCOUNTER — Encounter (INDEPENDENT_AMBULATORY_CARE_PROVIDER_SITE_OTHER): Payer: Self-pay | Admitting: Otolaryngology

## 2020-02-01 ENCOUNTER — Ambulatory Visit (INDEPENDENT_AMBULATORY_CARE_PROVIDER_SITE_OTHER): Payer: BC Managed Care – PPO | Admitting: Otolaryngology

## 2020-02-01 ENCOUNTER — Other Ambulatory Visit: Payer: Self-pay

## 2020-02-01 VITALS — Temp 97.9°F

## 2020-02-01 DIAGNOSIS — Z720 Tobacco use: Secondary | ICD-10-CM | POA: Diagnosis not present

## 2020-02-01 DIAGNOSIS — K219 Gastro-esophageal reflux disease without esophagitis: Secondary | ICD-10-CM

## 2020-02-01 NOTE — Progress Notes (Signed)
HPI: Eric Saunders is a 64 y.o. male who presents is referred by Nicoletta Ba, PA for evaluation of cough.  He states that he coughs so bad that sometimes he passes out.  He has seen cardiology and is presently wearing a 24-hour Holter monitor but no cardiac disease was identified.  He has also seen neurology.  He was hospitalized for couple days and had a CT scan and MRI scan which were reportedly normal.. He does smoke about a pack a day and has done this for over 30 years although he quit for short while. He feels like the coughing is triggered by reflux or something coming up his throat and that it is not necessarily a smoker's cough.  However when he starts coughing he has passed out.  The last time it occurred in April while he was in the bathroom and he started coughing and the next thing he knew he woke up having fallen and hit his head he was taken to the hospital. He has had ringing in his ears for several years but he states that when he passed out he felt like the ringing got worse.  He has not noticed any change in his hearing.  He has had a history of noise exposure in the past.  Past Medical History:  Diagnosis Date  . Coronary artery disease   . GERD (gastroesophageal reflux disease)   . Hyperlipemia   . Hypertension   . Panic disorder   . Tobacco abuse    Past Surgical History:  Procedure Laterality Date  . CORONARY ARTERY BYPASS GRAFT N/A 06/17/2014   Procedure: CORONARY ARTERY BYPASS GRAFTING (CABG) X 4, RIGHT LEG EVH;  Surgeon: Gaye Pollack, MD;  Location: [facility];  Service: Open Heart Surgery;  Laterality: N/A;  . KNEE SURGERY     bilateral arthroscopic  . LEFT HEART CATHETERIZATION WITH CORONARY ANGIOGRAM N/A 06/16/2014   Procedure: LEFT HEART CATHETERIZATION WITH CORONARY ANGIOGRAM;  Surgeon: Burnell Blanks, MD;  Location: [facility] CATH LAB;  Service: Cardiovascular;  Laterality: N/A;  . TEE WITHOUT CARDIOVERSION N/A 06/17/2014   Procedure: TRANSESOPHAGEAL  ECHOCARDIOGRAM (TEE);  Surgeon: Gaye Pollack, MD;  Location: [place];  Service: Open Heart Surgery;  Laterality: N/A;   Social History   Socioeconomic History  . Marital status: Married    Spouse name: Not on file  . Number of children: 2  . Years of education: Not on file  . Highest education level: Not on file  Occupational History  . Occupation: applying for disability  Tobacco Use  . Smoking status: Current Every Day Smoker    Packs/day: 1.50    Years: 30.00    Pack years: 45.00    Types: Cigarettes    Start date: 57  . Smokeless tobacco: Never Used  Substance and Sexual Activity  . Alcohol use: Yes    Alcohol/week: 6.0 standard drinks    Types: 6 Cans of beer per week    Comment: 6 weekly  . Drug use: No  . Sexual activity: Not on file  Other Topics Concern  . Not on file  Social History Narrative  . Not on file   Social Determinants of Health   Financial Resource Strain:   . Difficulty of Paying Living Expenses:   Food Insecurity:   . Worried About Charity fundraiser in the Last Year:   . Arboriculturist in the Last Year:   Transportation Needs:   . Lack of Transportation (  Medical):   Marland Kitchen Lack of Transportation (Non-Medical):   Physical Activity:   . Days of Exercise per Week:   . Minutes of Exercise per Session:   Stress:   . Feeling of Stress :   Social Connections:   . Frequency of Communication with Friends and Family:   . Frequency of Social Gatherings with Friends and Family:   . Attends Religious Services:   . Active Member of Clubs or Organizations:   . Attends Archivist Meetings:   Marland Kitchen Marital Status:    Family History  Problem Relation Age of Onset  . Drug abuse Sister   . Healthy Brother   . Diabetes Sister   . Healthy Sister   . Healthy Sister   . Healthy Sister   . Healthy Sister   . Healthy Brother   . Colon cancer Neg Hx   . Esophageal cancer Neg Hx   . Inflammatory bowel disease Neg Hx   . Liver disease Neg Hx   .  Pancreatic cancer Neg Hx   . Rectal cancer Neg Hx   . Stomach cancer Neg Hx    Allergies  Allergen Reactions  . Ace Inhibitors Cough  . Other    Prior to Admission medications   Medication Sig Start Date End Date Taking? Authorizing Provider  ALPRAZolam Duanne Moron) 0.5 MG tablet Take 1 tablet (0.5 mg total) by mouth 2 (two) times daily as needed for anxiety. 01/20/20  Yes Martin, Mary-Margaret, FNP  amLODipine (NORVASC) 10 MG tablet Take 1 tablet (10 mg total) by mouth daily. 01/20/20  Yes Hassell Done, Mary-Margaret, FNP  aspirin 81 MG tablet Take 81 mg by mouth daily.   Yes [provider]  escitalopram (LEXAPRO) 10 MG tablet Take 1 tablet (10 mg total) by mouth daily. 01/20/20  Yes Martin, Mary-Margaret, FNP  fenofibrate 160 MG tablet Take 1 tablet (160 mg total) by mouth daily. 01/20/20  Yes Martin, Mary-Margaret, FNP  fluticasone furoate-vilanterol (BREO ELLIPTA) 100-25 MCG/INH AEPB Inhale 1 puff into the lungs every morning. 10/07/19  Yes Hassell Done, Mary-Margaret, FNP  hydrochlorothiazide (HYDRODIURIL) 25 MG tablet Take 1 tablet (25 mg total) by mouth daily. 01/20/20  Yes Martin, Mary-Margaret, FNP  losartan (COZAAR) 100 MG tablet Take 1 tablet (100 mg total) by mouth daily. 01/20/20  Yes Hassell Done, Mary-Margaret, FNP  metoprolol succinate (TOPROL-XL) 25 MG 24 hr tablet Take 1 tablet (25 mg total) by mouth daily. 01/20/20  Yes Hassell Done, Mary-Margaret, FNP  omeprazole (PRILOSEC) 20 MG capsule Take 1 capsule (20 mg total) by mouth 2 (two) times daily before a meal. 01/18/20  Yes Mansouraty, Telford Nab., MD  pravastatin (PRAVACHOL) 80 MG tablet Take 80 mg by mouth daily. Take 1/2 tablet daily   Yes [provider]  sildenafil (VIAGRA) 100 MG tablet Take 0.5-1 tablets (50-100 mg total) by mouth daily as needed for erectile dysfunction. 07/04/15  Yes Martin, Mary-Margaret, FNP     Positive ROS: Otherwise negative.  All other systems have been reviewed and were otherwise negative with the exception of  those mentioned in the HPI and as above.  Physical Exam: Constitutional: Alert, well-appearing, no acute distress. Ears: External ears without lesions or tenderness. Ear canals are clear bilaterally.  TMs are clear bilaterally.  On hearing screening with a 1024 tuning fork he had a mild hearing loss in both ears which was symmetric. Nasal: External nose without lesions. Septum midline. Clear nasal passages bilaterally with no signs of infection and no drainage.  I reviewed the CT  scan performed previously and this showed clear paranasal sinuses except for some small mucous retention cyst. Oral: Lips and gums without lesions. Tongue and palate mucosa without lesions. Posterior oropharynx clear.  Tonsils are small and symmetric in appearance bilaterally. Fiberoptic laryngoscopy was performed to the left nostril.  The nasopharynx was clear.  Base of tongue vallecula and epiglottis were normal.  Vocal cords were clear bilaterally with normal vocal mobility.  Subglottis was clear.  Had mild arytenoid edema which may be reflective of reflux disease but no mucosal abnormalities noted.. Neck: No palpable adenopathy or masses.  No significant thyroid nodules identified. Respiratory: Breathing comfortably  Skin: No facial/neck lesions or rash noted.  Laryngoscopy  Date/Time: 02/01/2020 5:05 PM Performed by: Rozetta Nunnery, MD Authorized by: Rozetta Nunnery, MD   Consent:    Consent obtained:  Verbal   Consent given by:  Patient Procedure details:    Indications: direct visualization of the upper aerodigestive tract     Medication:  Afrin   Instrument: flexible fiberoptic laryngoscope     Scope location: left nare   Sinus:    Left nasopharynx: normal   Mouth:    Oropharynx: normal     Vallecula: normal     Base of tongue: normal     Epiglottis: normal   Throat:    Pyriform sinus: normal     True vocal cords: normal   Comments:     On fiberoptic laryngoscopy the upper airway was  normal to examination with no structural abnormalities.  No evidence of neoplasm or precancerous changes.    Assessment: History of passing out associated with coughing questionable etiology. Normal upper airway examination with no structural abnormalities noted on fiberoptic laryngoscopy. History of tobacco abuse as well as reflux disease.  Plan: Discussed with the patient today concerning stopping smoking altogether as this would be better for his health overall. Agreed with use of the omeprazole twice daily as he probably does have reflux although I am not sure this is what is causing him to pass out.   Radene Journey, MD   CC:

## 2020-02-02 DIAGNOSIS — M47816 Spondylosis without myelopathy or radiculopathy, lumbar region: Secondary | ICD-10-CM | POA: Diagnosis not present

## 2020-02-17 ENCOUNTER — Ambulatory Visit: Payer: Self-pay | Admitting: Nurse Practitioner

## 2020-02-25 DIAGNOSIS — M7501 Adhesive capsulitis of right shoulder: Secondary | ICD-10-CM | POA: Diagnosis not present

## 2020-02-25 DIAGNOSIS — M7542 Impingement syndrome of left shoulder: Secondary | ICD-10-CM | POA: Diagnosis not present

## 2020-03-02 ENCOUNTER — Encounter: Payer: Self-pay | Admitting: Gastroenterology

## 2020-03-02 ENCOUNTER — Ambulatory Visit (INDEPENDENT_AMBULATORY_CARE_PROVIDER_SITE_OTHER): Payer: BC Managed Care – PPO | Admitting: Gastroenterology

## 2020-03-02 VITALS — BP 132/80 | HR 64 | Ht 68.0 in | Wt 194.0 lb

## 2020-03-02 DIAGNOSIS — Z01818 Encounter for other preprocedural examination: Secondary | ICD-10-CM

## 2020-03-02 DIAGNOSIS — R198 Other specified symptoms and signs involving the digestive system and abdomen: Secondary | ICD-10-CM

## 2020-03-02 DIAGNOSIS — R0989 Other specified symptoms and signs involving the circulatory and respiratory systems: Secondary | ICD-10-CM | POA: Diagnosis not present

## 2020-03-02 DIAGNOSIS — R05 Cough: Secondary | ICD-10-CM

## 2020-03-02 DIAGNOSIS — Z1211 Encounter for screening for malignant neoplasm of colon: Secondary | ICD-10-CM

## 2020-03-02 DIAGNOSIS — K219 Gastro-esophageal reflux disease without esophagitis: Secondary | ICD-10-CM | POA: Diagnosis not present

## 2020-03-02 DIAGNOSIS — R054 Cough syncope: Secondary | ICD-10-CM

## 2020-03-02 MED ORDER — OMEPRAZOLE 40 MG PO CPDR: 40.0000 mg | DELAYED_RELEASE_CAPSULE | Freq: Two times a day (BID) | ORAL | 3 refills | Status: DC

## 2020-03-02 MED ORDER — SUTAB 1479-225-188 MG PO TABS: 24.0000 | ORAL_TABLET | ORAL | 0 refills | Status: DC

## 2020-03-02 NOTE — Patient Instructions (Signed)
You have been scheduled for an endoscopy and colonoscopy. Please follow the written instructions given to you at your visit today. Please pick up your prep supplies at the pharmacy within the next 1-3 days. If you use inhalers (even only as needed), please bring them with you on the day of your procedure.  We have sent the following medications to your pharmacy for you to pick up at your convenience: Sutab and Omeprazole   Continue current prescriptions of Omeprazole, once finished then pick up new prescription of Omeprazole 40mg  - twice daily.   If you are age 62 or older, your body mass index should be between 23-30. Your Body mass index is 29.5 kg/m. If this is out of the aforementioned range listed, please consider follow up with your Primary Care Provider.  If you are age 62 or younger, your body mass index should be between 19-25. Your Body mass index is 29.5 kg/m. If this is out of the aformentioned range listed, please consider follow up with your Primary Care Provider.    Due to recent changes in healthcare laws, you may see the results of your imaging and laboratory studies on MyChart before your provider has had a chance to review them.  We understand that in some cases there may be results that are confusing or concerning to you. Not all laboratory results come back in the same time frame and the provider may be waiting for multiple results in order to interpret others.  Please give [place] 48 hours in order for your provider to thoroughly review all the results before contacting the office for clarification of your results.   Thank you for choosing me and [facility].  Dr. Rush Landmark

## 2020-03-03 ENCOUNTER — Encounter: Payer: Self-pay | Admitting: Gastroenterology

## 2020-03-03 DIAGNOSIS — Z1211 Encounter for screening for malignant neoplasm of colon: Secondary | ICD-10-CM | POA: Insufficient documentation

## 2020-03-03 NOTE — Progress Notes (Signed)
[place] VISIT   Primary Care Provider [name], [address] [address] [phone]  Patient Profile: Eric Saunders is a 64 y.o. male with a pmh significant for CAD, hypertension, hyperlipidemia, tobacco use disorder, anxiety/panic disorder, recurrent idiopathic syncope (?cough syncope), GERD.  The patient presents to the [facility] for an evaluation and management of problem(s) noted below:  Problem List 1. Preop examination   2. Cough syncope   3. Gastroesophageal reflux disease, unspecified whether esophagitis present   4. Globus sensation   5. Colon cancer screening     History of Present Illness Please see initial consultation note by PA [place] and my prior progress notes for full details of HPI.  Interval History The patient returns for scheduled follow-up.  Patient was evaluated by ENT.  No overt etiology for his symptoms of cough syncope were noted.  He agreed with trialing GERD treatment optimization to see if that would help but it is not clear whether that will play a role.  He continues to smoke.  Patient describes his GERD symptoms are well controlled on his current dosing of 20 mg twice daily.  He is willing to move forward with any additional work-up that may help elucidate the symptoms.  He is not having overt dysphagia symptoms but wonders if we do an endoscopy whether we may do a dilation.  Bowels remain unchanged.  He is interested in colon cancer screening at this time.  GI Review of Systems Positive as above Negative for abdominal pain, nausea, vomiting, odynophagia, melena, hematochezia  Review of Systems General: Denies fevers/chills/unintentional weight loss HEENT: Denies oral lesions Cardiovascular: Denies chest pain/palpitations Pulmonary: Denies shortness of breath Gastroenterological: See HPI Genitourinary: Denies darkened urine Hematological: Denies easy  bruising/bleeding Dermatological: Denies jaundice Psychological: Mood is stable   Medications Current Outpatient Medications  Medication Sig Dispense Refill  . ALPRAZolam (XANAX) 0.5 MG tablet Take 1 tablet (0.5 mg total) by mouth 2 (two) times daily as needed for anxiety. 60 tablet 2  . amLODipine (NORVASC) 10 MG tablet Take 1 tablet (10 mg total) by mouth daily. 90 tablet 1  . aspirin 81 MG tablet Take 81 mg by mouth daily.    Marland Kitchen escitalopram (LEXAPRO) 10 MG tablet Take 1 tablet (10 mg total) by mouth daily. 90 tablet 1  . fenofibrate 160 MG tablet Take 1 tablet (160 mg total) by mouth daily. 90 tablet 1  . fluticasone furoate-vilanterol (BREO ELLIPTA) 100-25 MCG/INH AEPB Inhale 1 puff into the lungs every morning. 1 each 5  . hydrochlorothiazide (HYDRODIURIL) 25 MG tablet Take 1 tablet (25 mg total) by mouth daily. 90 tablet 1  . losartan (COZAAR) 100 MG tablet Take 1 tablet (100 mg total) by mouth daily. 90 tablet 1  . metoprolol succinate (TOPROL-XL) 25 MG 24 hr tablet Take 1 tablet (25 mg total) by mouth daily. 90 tablet 1  . pravastatin (PRAVACHOL) 80 MG tablet Take 80 mg by mouth daily. Take 1/2 tablet daily    . sildenafil (VIAGRA) 100 MG tablet Take 0.5-1 tablets (50-100 mg total) by mouth daily as needed for erectile dysfunction. 5 tablet 11  . omeprazole (PRILOSEC) 40 MG capsule Take 1 capsule (40 mg total) by mouth 2 (two) times daily. 60 capsule 3  . Sodium Sulfate-Mag Sulfate-KCl (SUTAB) [phone] MG TABS Take 24 tablets by mouth as directed. 24 tablet 0   No current facility-administered medications for this visit.    Allergies Allergies  Allergen  Reactions  . Ace Inhibitors Cough  . Other     Histories Past Medical History:  Diagnosis Date  . Coronary artery disease   . GERD (gastroesophageal reflux disease)   . Hyperlipemia   . Hypertension   . Panic disorder   . Tobacco abuse    Past Surgical History:  Procedure Laterality Date  . CORONARY ARTERY  BYPASS GRAFT N/A 06/17/2014   Procedure: CORONARY ARTERY BYPASS GRAFTING (CABG) X 4, RIGHT LEG EVH;  Surgeon: Gaye Pollack, MD;  Location: [place] [facility];  Service: Open Heart Surgery;  Laterality: N/A;  . KNEE SURGERY     bilateral arthroscopic  . LEFT HEART CATHETERIZATION WITH CORONARY ANGIOGRAM N/A 06/16/2014   Procedure: LEFT HEART CATHETERIZATION WITH CORONARY ANGIOGRAM;  Surgeon: Burnell Blanks, MD;  Location: [facility] CATH LAB;  Service: Cardiovascular;  Laterality: N/A;  . TEE WITHOUT CARDIOVERSION N/A 06/17/2014   Procedure: TRANSESOPHAGEAL ECHOCARDIOGRAM (TEE);  Surgeon: Gaye Pollack, MD;  Location: [place];  Service: Open Heart Surgery;  Laterality: N/A;   Social History   Socioeconomic History  . Marital status: Married    Spouse name: Not on file  . Number of children: 2  . Years of education: Not on file  . Highest education level: Not on file  Occupational History  . Occupation: applying for disability  Tobacco Use  . Smoking status: Current Every Day Smoker    Packs/day: 1.50    Years: 30.00    Pack years: 45.00    Types: Cigarettes    Start date: 69  . Smokeless tobacco: Never Used  Vaping Use  . Vaping Use: Never used  Substance and Sexual Activity  . Alcohol use: Yes    Alcohol/week: 6.0 standard drinks    Types: 6 Cans of beer per week    Comment: 6 weekly  . Drug use: No  . Sexual activity: Not on file  Other Topics Concern  . Not on file  Social History Narrative  . Not on file   Social Determinants of Health   Financial Resource Strain:   . Difficulty of Paying Living Expenses:   Food Insecurity:   . Worried About Charity fundraiser in the Last Year:   . Arboriculturist in the Last Year:   Transportation Needs:   . Film/video editor (Medical):   Marland Kitchen Lack of Transportation (Non-Medical):   Physical Activity:   . Days of Exercise per Week:   . Minutes of Exercise per Session:   Stress:   . Feeling of Stress :   Social Connections:   .  Frequency of Communication with Friends and Family:   . Frequency of Social Gatherings with Friends and Family:   . Attends Religious Services:   . Active Member of Clubs or Organizations:   . Attends Archivist Meetings:   Marland Kitchen Marital Status:   Intimate Partner Violence:   . Fear of Current or Ex-Partner:   . Emotionally Abused:   Marland Kitchen Physically Abused:   . Sexually Abused:    Family History  Problem Relation Age of Onset  . Drug abuse Sister   . Healthy Brother   . Diabetes Sister   . Healthy Sister   . Healthy Sister   . Healthy Sister   . Healthy Sister   . Healthy Brother   . Colon cancer Neg Hx   . Esophageal cancer Neg Hx   . Inflammatory bowel disease Neg Hx   . Liver disease  Neg Hx   . Pancreatic cancer Neg Hx   . Rectal cancer Neg Hx   . Stomach cancer Neg Hx    I have reviewed his medical, social, and family history in detail and updated the electronic medical record as necessary.    PHYSICAL EXAMINATION  BP 132/80   Pulse 64   Ht 5\' 8"  (1.727 m)   Wt 194 lb (88 kg)   BMI 29.50 kg/m  Wt Readings from Last 3 Encounters:  03/02/20 194 lb (88 kg)  01/21/20 197 lb 3.2 oz (89.4 kg)  01/20/20 197 lb 12.8 oz (89.7 kg)  GEN: NAD, appears stated age, doesn't appear chronically ill PSYCH: Cooperative, without pressured speech EYE: Conjunctivae pink, sclerae anicteric ENT: MMM CV: Nontachycardic RESP: No audible wheezing GI: Soft, nontender, nondistended, without rebound or guarding  MSK/EXT: No lower extremity edema SKIN: No jaundice NEURO:  Alert & Oriented x 3, no focal deficits   REVIEW OF DATA  I reviewed the following data at the time of this encounter:  GI Procedures and Studies  No relevant GI studies to review  Laboratory Studies  Reviewed those in epic  Imaging Studies  No new imaging studies to review   ASSESSMENT  Mr. Wiltrout is a 64 y.o. male with a pmh significant for CAD, hypertension, hyperlipidemia, tobacco use disorder,  anxiety/panic disorder, recurrent idiopathic syncope (?cough syncope), GERD.  The patient is seen today for evaluation and management of:  1. Preop examination   2. Cough syncope   3. Gastroesophageal reflux disease, unspecified whether esophagitis present   4. Globus sensation   5. Colon cancer screening    The patient is stable hemodynamically at this time.  His GERD symptoms remain well controlled on his twice daily 20 mg dosing of PPI.  The patient however continues to have symptoms.  I once again do not believe that uncontrolled GERD and severe esophagitis is causing the patient's symptoms.  With that being said after discussion with his cardiology team and follow-up of the ENT evaluation I am willing to move forward with high-dose PPI therapy to see if that makes a difference.  An endoscopic evaluation and potential dilation will be pursued as well.  If this is unremarkable and he does not have evidence of severe esophagitis I am not sure pH impedance catheter will guide [place] any further.  If he has severe esophagitis on PPI therapy well then further nonmedicinal treatments will need to be considered.  We will screen him for Barrett's esophagus as well.  Patient is willing to move forward with colon cancer screening via colonoscopy.  The risks and benefits of endoscopic evaluation were discussed with the patient; these include but are not limited to the risk of perforation, infection, bleeding, missed lesions, lack of diagnosis, severe illness requiring hospitalization, as well as anesthesia and sedation related illnesses.  The patient is agreeable to proceed.  All patient questions were answered, to the best of my ability, and the patient agrees to the aforementioned plan of action with follow-up as indicated.   PLAN  Increase omeprazole to 40 mg twice daily and maintain Diagnostic endoscopy with esophageal biopsies at minimum and likely dilation Colon cancer screening via colonoscopy Not clear  pH impedance testing will be needed  Will make sure that from a cardiology perspective that nothing is found on Holter monitor that would prevent him from moving forward with planned endoscopic evaluation   Orders Placed This Encounter  Procedures  . SARS Coronavirus 2 ([facility]  Endo/Gastro ONLY)  . Ambulatory referral to Gastroenterology    New Prescriptions   OMEPRAZOLE (PRILOSEC) 40 MG CAPSULE    Take 1 capsule (40 mg total) by mouth 2 (two) times daily.   SODIUM SULFATE-MAG SULFATE-KCL (SUTAB) [phone] MG TABS    Take 24 tablets by mouth as directed.   Modified Medications   No medications on file    Planned Follow Up No follow-ups on file.   Total Time in Face-to-Face and in Coordination of Care for patient including independent/personal interpretation/review of prior testing, medical history, examination, medication adjustment, communicating results with the patient directly, and documentation with the EHR is 25 minutes.   [name], MD [facility] Advanced Endoscopy Office # [phone]

## 2020-03-06 ENCOUNTER — Telehealth: Payer: Self-pay | Admitting: Cardiology

## 2020-03-06 NOTE — Telephone Encounter (Signed)
Routed to [employer]

## 2020-03-06 NOTE — Telephone Encounter (Signed)
    Pt is returning call from [facility] about heart monitor results

## 2020-03-07 NOTE — Telephone Encounter (Signed)
Spoke to patient recent monitor results given.Stated he has not had any more syncopal episodes.Advised to keep appointment as planned with Dr.Hochrein.Advised to call sooner if he has any more syncopal episodes.

## 2020-03-07 NOTE — Telephone Encounter (Signed)
Pt wants to know his results as to regards his holter monitor that he had wore. Please call to discuss

## 2020-04-06 DIAGNOSIS — M5136 Other intervertebral disc degeneration, lumbar region: Secondary | ICD-10-CM | POA: Diagnosis not present

## 2020-05-02 NOTE — Progress Notes (Signed)
Cardiology Office Note   Date:  05/03/2020   ID:  Eric Saunders, Eric Saunders 01-30-1956, MRN [id number]  PCP:  [name], FNP  Cardiologist:   Minus Breeding, MD   Chief Complaint  Patient presents with   Loss of Consciousness     History of Present Illness: Eric Saunders is a 64 y.o. male who presents for followup after CABG.  Last year he had some increased dyspnea.  I sent him for a POET (Plain Old Exercise Treadmill) and he had no evidence of ischemia.   He did have a bit of a hypertensive blood pressure response.    Since I last saw him he was hospitalized earlier this year with syncope.  He has had about five or six episodes in the last 6 months.  These are always associated with cough.  He had an EEG.  Neurology was  EEG was negative.  MRI brain was unremarkable.  Carotid Dopplers were unremarkable.  He is now being treated with higher dose Prilosec 40 mg twice daily and is having less cough and less syncope.  He is going to get an endoscopy and possible esophageal dilatation if necessary.  He is doing a little bit better avoiding foods.  He still smoking cigarettes but plans to quit on his birthday.  He quit for 26 days last year.  He denies any new chest discomfort, neck or arm discomfort.  Is not having any new shortness of breath, PND or orthopnea.  He has had no new palpitations, presyncope or syncope.   Past Medical History:  Diagnosis Date   Coronary artery disease    GERD (gastroesophageal reflux disease)    Hyperlipemia    Hypertension    Panic disorder    Tobacco abuse     Past Surgical History:  Procedure Laterality Date   CORONARY ARTERY BYPASS GRAFT N/A 06/17/2014   Procedure: CORONARY ARTERY BYPASS GRAFTING (CABG) X 4, RIGHT LEG EVH;  Surgeon: Gaye Pollack, MD;  Location: [place];  Service: Open Heart Surgery;  Laterality: N/A;   KNEE SURGERY     bilateral arthroscopic   LEFT HEART CATHETERIZATION WITH CORONARY ANGIOGRAM N/A 06/16/2014    Procedure: LEFT HEART CATHETERIZATION WITH CORONARY ANGIOGRAM;  Surgeon: Burnell Blanks, MD;  Location: [facility] CATH LAB;  Service: Cardiovascular;  Laterality: N/A;   TEE WITHOUT CARDIOVERSION N/A 06/17/2014   Procedure: TRANSESOPHAGEAL ECHOCARDIOGRAM (TEE);  Surgeon: Gaye Pollack, MD;  Location: [place];  Service: Open Heart Surgery;  Laterality: N/A;     Current Outpatient Medications  Medication Sig Dispense Refill   ALPRAZolam (XANAX) 0.5 MG tablet Take 1 tablet (0.5 mg total) by mouth 2 (two) times daily as needed for anxiety. 60 tablet 2   amLODipine (NORVASC) 10 MG tablet Take 1 tablet (10 mg total) by mouth daily. 90 tablet 1   aspirin 81 MG tablet Take 81 mg by mouth daily.     escitalopram (LEXAPRO) 10 MG tablet Take 1 tablet (10 mg total) by mouth daily. 90 tablet 1   fenofibrate 160 MG tablet Take 1 tablet (160 mg total) by mouth daily. 90 tablet 1   hydrochlorothiazide (HYDRODIURIL) 25 MG tablet Take 1 tablet (25 mg total) by mouth daily. 90 tablet 1   losartan (COZAAR) 100 MG tablet Take 1 tablet (100 mg total) by mouth daily. 90 tablet 1   metoprolol succinate (TOPROL-XL) 25 MG 24 hr tablet Take 1 tablet (25 mg total) by mouth daily. 90 tablet 1  omeprazole (PRILOSEC) 40 MG capsule Take 1 capsule (40 mg total) by mouth 2 (two) times daily. 60 capsule 3   pravastatin (PRAVACHOL) 80 MG tablet Take 80 mg by mouth daily. Take 1/2 tablet daily     sildenafil (VIAGRA) 100 MG tablet Take 0.5-1 tablets (50-100 mg total) by mouth daily as needed for erectile dysfunction. 5 tablet 11   fluticasone furoate-vilanterol (BREO ELLIPTA) 100-25 MCG/INH AEPB Inhale 1 puff into the lungs every morning. (Patient not taking: Reported on 05/03/2020) 1 each 5   Sodium Sulfate-Mag Sulfate-KCl (SUTAB) [phone] MG TABS Take 24 tablets by mouth as directed. 24 tablet 0   No current facility-administered medications for this visit.    Allergies:   Ace inhibitors and Other    ROS:   Please see the history of present illness.   Otherwise, review of systems are positive for none.   All other systems are reviewed and negative.    PHYSICAL EXAM: VS:  BP (!) 152/88    Pulse (!) 56    Ht 5\' 8"  (1.727 m)    Wt 192 lb (87.1 kg)    BMI 29.19 kg/m  , BMI Body mass index is 29.19 kg/m. GENERAL:  Well appearing NECK:  No jugular venous distention, waveform within normal limits, carotid upstroke brisk and symmetric, no bruits, no thyromegaly LUNGS:  Clear to auscultation bilaterally CHEST:  Well healed sternotomy scar. HEART:  PMI not displaced or sustained,S1 and S2 within normal limits, no S3, no S4, no clicks, no rubs, no murmurs ABD:  Flat, positive bowel sounds normal in frequency in pitch, no bruits, no rebound, no guarding, no midline pulsatile mass, no hepatomegaly, no splenomegaly EXT:  2 plus pulses throughout, no edema, no cyanosis no clubbing   EKG:  EKG is not ordered today.   Recent Labs: 01/03/2020: TSH 0.841 01/04/2020: BUN 19; Creatinine, Ser 1.17; Hemoglobin 14.4; Platelets 394; Potassium 3.8; Sodium 139    Lipid Panel    Component Value Date/Time   CHOL 167 01/04/2020 0210   CHOL 205 (H) 04/06/2019 0908   CHOL 166 12/28/2012 0932   TRIG 318 (H) 01/04/2020 0210   TRIG 157 (H) 11/23/2014 1549   TRIG 129 12/28/2012 0932   HDL 30 (L) 01/04/2020 0210   HDL 43 04/06/2019 0908   HDL 44 11/23/2014 1549   HDL 42 12/28/2012 0932   CHOLHDL 5.6 01/04/2020 0210   VLDL 64 (H) 01/04/2020 0210   LDLCALC 73 01/04/2020 0210   LDLCALC 127 (H) 04/06/2019 0908   LDLCALC 90 07/06/2014 0000   LDLCALC 98 12/28/2012 0932      Wt Readings from Last 3 Encounters:  05/03/20 192 lb (87.1 kg)  03/02/20 194 lb (88 kg)  01/21/20 197 lb 3.2 oz (89.4 kg)      Other studies Reviewed: Additional studies/ records that were reviewed today include:  GI note Review of the above records demonstrates:  Please see elsewhere in the note.     ASSESSMENT AND PLAN:  CAD/CABG:    The patient has no new sypmtoms.  No further cardiovascular testing is indicated.  We will continue with aggressive risk reduction and meds as listed.  He had a negative treadmill test last year.  SYNCOPE:   As above he is improved with less cough.  He had ENT follow-up with no clear issues.  There is no clear therapy for tinnitus that he has been complaining of.  He is not having a GI work-up as above.  We talked  about foods that would exacerbate his reflux.   TOBACCO:   Again we talked about the need to stop smoking.  DYSLIPIDEMIA:   LDL was 73.  No change in therapy.   HTN:  The blood pressure is slightly elevated and he is going to keep a blood pressure diary.  I probably will need to adjust his medicines.  I do not think he tolerate a higher beta-blocker dose but he might need another agent.   COVID EDUCATION: He has not wanted to be vaccinated and we had a long conversation about this.  Current medicines are reviewed at length with the patient today.  The patient does not have concerns regarding medicines.  The following changes have been made:  None  Labs/ tests ordered today include: None  No orders of the defined types were placed in this encounter.    Disposition:   FU with me in 6 months.    Signed, Minus Breeding, MD  05/03/2020 10:27 AM    [facility]

## 2020-05-03 ENCOUNTER — Ambulatory Visit: Payer: BC Managed Care – PPO | Admitting: Cardiology

## 2020-05-03 ENCOUNTER — Encounter: Payer: Self-pay | Admitting: Cardiology

## 2020-05-03 ENCOUNTER — Other Ambulatory Visit: Payer: Self-pay

## 2020-05-03 VITALS — BP 152/88 | HR 56 | Ht 68.0 in | Wt 192.0 lb

## 2020-05-03 DIAGNOSIS — R05 Cough: Secondary | ICD-10-CM | POA: Diagnosis not present

## 2020-05-03 DIAGNOSIS — E785 Hyperlipidemia, unspecified: Secondary | ICD-10-CM | POA: Diagnosis not present

## 2020-05-03 DIAGNOSIS — R55 Syncope and collapse: Secondary | ICD-10-CM | POA: Diagnosis not present

## 2020-05-03 DIAGNOSIS — Z720 Tobacco use: Secondary | ICD-10-CM

## 2020-05-03 DIAGNOSIS — R059 Cough, unspecified: Secondary | ICD-10-CM

## 2020-05-03 DIAGNOSIS — I10 Essential (primary) hypertension: Secondary | ICD-10-CM

## 2020-05-03 NOTE — Patient Instructions (Signed)
Medication Instructions:  The current medical regimen is effective;  continue present plan and medications.  *If you need a refill on your cardiac medications before your next appointment, please call your pharmacy*  Follow-Up: At [facility], you and your health needs are our priority.  As part of our continuing mission to provide you with exceptional heart care, we have created designated Provider Care Teams.  These Care Teams include your primary Cardiologist (physician) and Advanced Practice Providers (APPs -  Physician Assistants and Nurse Practitioners) who all work together to provide you with the care you need, when you need it.  We recommend signing up for the patient portal called "MyChart".  Sign up information is provided on this After Visit Summary.  MyChart is used to connect with patients for Virtual Visits (Telemedicine).  Patients are able to view lab/test results, encounter notes, upcoming appointments, etc.  Non-urgent messages can be sent to your provider as well.   To learn more about what you can do with MyChart, go to [email or web address].    Your next appointment:   6 month(s)  The format for your next appointment:   In Person  Provider:   James Hochrein, MD   Thank you for choosing  [facility]!!     

## 2020-05-05 ENCOUNTER — Ambulatory Visit (INDEPENDENT_AMBULATORY_CARE_PROVIDER_SITE_OTHER): Payer: BC Managed Care – PPO

## 2020-05-05 ENCOUNTER — Other Ambulatory Visit: Payer: Self-pay | Admitting: Gastroenterology

## 2020-05-05 ENCOUNTER — Other Ambulatory Visit: Payer: Self-pay

## 2020-05-05 DIAGNOSIS — Z1159 Encounter for screening for other viral diseases: Secondary | ICD-10-CM

## 2020-05-05 LAB — SARS CORONAVIRUS 2 (TAT 6-24 HRS): SARS Coronavirus 2: NEGATIVE

## 2020-05-09 ENCOUNTER — Encounter: Payer: Self-pay | Admitting: Gastroenterology

## 2020-05-09 ENCOUNTER — Ambulatory Visit (AMBULATORY_SURGERY_CENTER): Payer: BC Managed Care – PPO | Admitting: Gastroenterology

## 2020-05-09 ENCOUNTER — Other Ambulatory Visit: Payer: Self-pay

## 2020-05-09 VITALS — BP 126/61 | HR 63 | Temp 96.9°F | Resp 15 | Ht 68.0 in | Wt 194.0 lb

## 2020-05-09 DIAGNOSIS — K319 Disease of stomach and duodenum, unspecified: Secondary | ICD-10-CM | POA: Diagnosis not present

## 2020-05-09 DIAGNOSIS — K219 Gastro-esophageal reflux disease without esophagitis: Secondary | ICD-10-CM | POA: Diagnosis not present

## 2020-05-09 DIAGNOSIS — R1310 Dysphagia, unspecified: Secondary | ICD-10-CM | POA: Diagnosis not present

## 2020-05-09 DIAGNOSIS — K229 Disease of esophagus, unspecified: Secondary | ICD-10-CM

## 2020-05-09 DIAGNOSIS — D124 Benign neoplasm of descending colon: Secondary | ICD-10-CM

## 2020-05-09 DIAGNOSIS — K3189 Other diseases of stomach and duodenum: Secondary | ICD-10-CM | POA: Diagnosis not present

## 2020-05-09 DIAGNOSIS — K2970 Gastritis, unspecified, without bleeding: Secondary | ICD-10-CM | POA: Diagnosis not present

## 2020-05-09 DIAGNOSIS — R054 Cough syncope: Secondary | ICD-10-CM

## 2020-05-09 DIAGNOSIS — D128 Benign neoplasm of rectum: Secondary | ICD-10-CM

## 2020-05-09 DIAGNOSIS — Z1211 Encounter for screening for malignant neoplasm of colon: Secondary | ICD-10-CM

## 2020-05-09 DIAGNOSIS — D123 Benign neoplasm of transverse colon: Secondary | ICD-10-CM | POA: Diagnosis not present

## 2020-05-09 DIAGNOSIS — K2100 Gastro-esophageal reflux disease with esophagitis, without bleeding: Secondary | ICD-10-CM | POA: Diagnosis not present

## 2020-05-09 DIAGNOSIS — R55 Syncope and collapse: Secondary | ICD-10-CM

## 2020-05-09 DIAGNOSIS — K2980 Duodenitis without bleeding: Secondary | ICD-10-CM

## 2020-05-09 MED ORDER — SODIUM CHLORIDE 0.9 % IV SOLN: 500.0000 mL | Freq: Once | INTRAVENOUS | Status: DC

## 2020-05-09 NOTE — Op Note (Signed)
[name] Patient Name: Eric Saunders Procedure Date: 05/09/2020 10:40 AM MRN: [id number] Endoscopist: [name] , MD Age: 64 Referring MD:  Date of Birth: [birth year] Gender: Male Account #: [id number] Procedure:                Colonoscopy Indications:              Screening for colorectal malignant neoplasm Medicines:                Monitored Anesthesia Care Procedure:                Pre-Anesthesia Assessment:                           - Prior to the procedure, a History and Physical                            was performed, and patient medications and                            allergies were reviewed. The patient's tolerance of                            previous anesthesia was also reviewed. The risks                            and benefits of the procedure and the sedation                            options and risks were discussed with the patient.                            All questions were answered, and informed consent                            was obtained. Prior Anticoagulants: The patient has                            taken no previous anticoagulant or antiplatelet                            agents except for aspirin. ASA Grade Assessment: II                            - A patient with mild systemic disease. After                            reviewing the risks and benefits, the patient was                            deemed in satisfactory condition to undergo the                            procedure.  After obtaining informed consent, the colonoscope                            was passed under direct vision. Throughout the                            procedure, the patient's blood pressure, pulse, and                            oxygen saturations were monitored continuously. The                            Colonoscope was introduced through the anus and                            advanced to the 5 cm into the ileum. The                             colonoscopy was performed without difficulty. The                            patient tolerated the procedure. The quality of the                            bowel preparation was good. The terminal ileum,                            ileocecal valve, appendiceal orifice, and rectum                            were photographed. Scope In: 11:22:40 AM Scope Out: 11:32:47 AM Scope Withdrawal Time: 0 hours 8 minutes 2 seconds  Total Procedure Duration: 0 hours 10 minutes 7 seconds  Findings:                 The digital rectal exam findings include                            hemorrhoids. Pertinent negatives include no                            palpable rectal lesions.                           The terminal ileum and ileocecal valve appeared                            normal.                           Multiple small-mouthed diverticula were found in                            the recto-sigmoid colon, sigmoid colon, descending  colon, transverse colon and ascending colon                            (majority found in left colon however).                           Normal mucosa was found in the entire colon                            otheriwse.                           Non-bleeding non-thrombosed internal hemorrhoids                            were found during retroflexion, during perianal                            exam and during digital exam. The hemorrhoids were                            Grade II (internal hemorrhoids that prolapse but                            reduce spontaneously). Complications:            No immediate complications. Estimated Blood Loss:     Estimated blood loss was minimal. Estimated blood                            loss: none. Impression:               - Hemorrhoids found on digital rectal exam.                           - The examined portion of the ileum was normal.                           - Diverticulosis in the  recto-sigmoid colon, in the                            sigmoid colon, in the descending colon, in the                            transverse colon and in the ascending colon                            (majority if left colon).                           - Normal mucosa in the entire examined colon                            otherwise.                           -  Non-bleeding non-thrombosed internal hemorrhoids. Recommendation:           - The patient will be observed post-procedure,                            until all discharge criteria are met.                           - Discharge patient to home.                           - Patient has a contact number available for                            emergencies. The signs and symptoms of potential                            delayed complications were discussed with the                            patient. Return to normal activities tomorrow.                            Written discharge instructions were provided to the                            patient.                           - High fiber diet.                           - Continue present medications.                           - Use FiberCon 1-2 tablets PO daily.                           - Repeat colonoscopy in 10 years for screening                            purposes.                           - The findings and recommendations were discussed                            with the patient. [name], MD 05/09/2020 11:48:42 AM

## 2020-05-09 NOTE — Progress Notes (Signed)
VS-CW 

## 2020-05-09 NOTE — Patient Instructions (Signed)
Please read handouts provided. Continue present medications. Await pathology results. High Fiber Diet. Follow Dilation Diet today. Use FiberCon 1-2 tablets daily. Continue twice daily PPI.     YOU HAD AN ENDOSCOPIC PROCEDURE TODAY AT [facility]:   Refer to the procedure report that was given to you for any specific questions about what was found during the examination.  If the procedure report does not answer your questions, please call your gastroenterologist to clarify.  If you requested that your care partner not be given the details of your procedure findings, then the procedure report has been included in a sealed envelope for you to review at your convenience later.  YOU SHOULD EXPECT: Some feelings of bloating in the abdomen. Passage of more gas than usual.  Walking can help get rid of the air that was put into your GI tract during the procedure and reduce the bloating. If you had a lower endoscopy (such as a colonoscopy or flexible sigmoidoscopy) you may notice spotting of blood in your stool or on the toilet paper. If you underwent a bowel prep for your procedure, you may not have a normal bowel movement for a few days.  Please Note:  You might notice some irritation and congestion in your nose or some drainage.  This is from the oxygen used during your procedure.  There is no need for concern and it should clear up in a day or so.  SYMPTOMS TO REPORT IMMEDIATELY:   Following lower endoscopy (colonoscopy or flexible sigmoidoscopy):  Excessive amounts of blood in the stool  Significant tenderness or worsening of abdominal pains  Swelling of the abdomen that is new, acute  Fever of 100F or higher   Following upper endoscopy (EGD)  Vomiting of blood or coffee ground material  New chest pain or pain under the shoulder blades  Painful or persistently difficult swallowing  New shortness of breath  Fever of 100F or higher  Black, tarry-looking stools  For  urgent or emergent issues, a gastroenterologist can be reached at any hour by calling [phone]. Do not use MyChart messaging for urgent concerns.    DIET Drink plenty of fluids but you should avoid alcoholic beverages for 24 hours.  ACTIVITY:  You should plan to take it easy for the rest of today and you should NOT DRIVE or use heavy machinery until tomorrow (because of the sedation medicines used during the test).    FOLLOW UP: Our staff will call the number listed on your records 48-72 hours following your procedure to check on you and address any questions or concerns that you may have regarding the information given to you following your procedure. If we do not reach you, we will leave a message.  We will attempt to reach you two times.  During this call, we will ask if you have developed any symptoms of COVID 19. If you develop any symptoms (ie: fever, flu-like symptoms, shortness of breath, cough etc.) before then, please call [phone].  If you test positive for Covid 19 in the 2 weeks post procedure, please call and report this information to [place].    If any biopsies were taken you will be contacted by phone or by letter within the next 1-3 weeks.  Please call us at [phone] if you have not heard about the biopsies in 3 weeks.    SIGNATURES/CONFIDENTIALITY: You and/or your care partner have signed paperwork which will be entered into your electronic medical record.  These  signatures attest to the fact that that the information above on your After Visit Summary has been reviewed and is understood.  Full responsibility of the confidentiality of this discharge information lies with you and/or your care-partner.

## 2020-05-09 NOTE — Op Note (Signed)
[place] Patient Name: Eric Saunders Procedure Date: 05/09/2020 10:41 AM MRN: [id number] Endoscopist: [name] , MD Age: 64 Referring MD:  Date of Birth: 29-Sep-1955 Gender: Male Account #: [id number] Procedure:                Upper GI endoscopy Indications:              Dysphagia, Heartburn, Chronic cough, Cough-Syncope Medicines:                Monitored Anesthesia Care Procedure:                Pre-Anesthesia Assessment:                           - Prior to the procedure, a History and Physical                            was performed, and patient medications and                            allergies were reviewed. The patient's tolerance of                            previous anesthesia was also reviewed. The risks                            and benefits of the procedure and the sedation                            options and risks were discussed with the patient.                            All questions were answered, and informed consent                            was obtained. Prior Anticoagulants: The patient has                            taken no previous anticoagulant or antiplatelet                            agents except for aspirin. ASA Grade Assessment: II                            - A patient with mild systemic disease. After                            reviewing the risks and benefits, the patient was                            deemed in satisfactory condition to undergo the                            procedure.  After obtaining informed consent, the endoscope was                            passed under direct vision. Throughout the                            procedure, the patient's blood pressure, pulse, and                            oxygen saturations were monitored continuously. The                            Endoscope was introduced through the mouth, and                            advanced to the second part of  duodenum. The upper                            GI endoscopy was accomplished without difficulty.                            The patient tolerated the procedure. Scope In: Scope Out: Findings:                 No gross lesions were noted in the proximal                            esophagus and in the mid esophagus. Biopsies were                            taken with a cold forceps for histology.                           Scattered islands of salmon-colored mucosa were                            present at 39 cm. No other visible abnormalities                            were present. Biopsies were taken with a cold                            forceps for histology.                           The Z-line was irregular and was found 40 cm from                            the incisors.                           At completion of the EGD a guidewire was placed and  the scope was withdrawn. Dilation was performed in                            the entire esophagus with a Savary dilator with no                            resistance at 18 mm. The dilation site was examined                            following endoscope reinsertion and showed mild                            mucosal disruption just below the UES.                           Patchy severely erythematous mucosa without                            bleeding was found in the cardia, in the gastric                            body and in the gastric antrum. Biopsies were taken                            with a cold forceps for histology and Helicobacter                            pylori testing.                           Patchy mildly erythematous mucosa without active                            bleeding and with no stigmata of bleeding was found                            in the duodenal bulb, in the first portion of the                            duodenum and in the second portion of the duodenum.                             Biopsies were taken with a cold forceps for                            histology and Helicobacter pylori testing. Complications:            No immediate complications. Estimated Blood Loss:     Estimated blood loss was minimal. Impression:               - No gross lesions in esophagus proximally.                            Biopsied. Salmon-colored  mucosa island suspicious                            for Barrett's esophagus. Biopsied.                           - Z-line irregular, 40 cm from the incisors.                           - Dilation performed in the entire esophagus.                           - Erythematous mucosa in the cardia, gastric body                            and antrum. Biopsied.                           - Erythematous duodenopathy. Biopsied. Recommendation:           - Proceed to scheduled colonoscopy.                           - Dilation diet as per protocol.                           - Continue twice daily PPI. We will consider after                            biopsies return for potential addition of                            Sucralfate due to significant gastritis noted.                           - Observe patient's clinical course.                           - May need to consider esophageal manometry and pH                            impedence testing in future if liquid dysphagia                            recurs.                           - The findings and recommendations were discussed                            with the patient. [name], MD 05/09/2020 11:45:14 AM

## 2020-05-09 NOTE — Progress Notes (Signed)
To PACU, VSS. Report to [email or web address] 

## 2020-05-09 NOTE — Progress Notes (Signed)
Called to room to assist during endoscopic procedure.  Patient ID and intended procedure confirmed with present staff. Received instructions for my participation in the procedure from the performing physician.  

## 2020-05-11 ENCOUNTER — Telehealth: Payer: Self-pay

## 2020-05-11 NOTE — Telephone Encounter (Signed)
Patient returned call, advised that he should be taking one Omeprazole 40 mg capsule by mouth twice a day. Patient repeated and verbalized understanding of medication dosage

## 2020-05-11 NOTE — Telephone Encounter (Signed)
Lm on vm for patient to return call 

## 2020-05-11 NOTE — Telephone Encounter (Signed)
  Follow up Call-  Call back number 05/09/2020  Post procedure Call Back phone  # [phone]  Permission to leave phone message Yes  Some recent data might be hidden     Patient questions:  Do you have a fever, pain , or abdominal swelling? No. Pain Score  0 *  Have you tolerated food without any problems? Yes.    Have you been able to return to your normal activities? Yes.    Do you have any questions about your discharge instructions: Diet   No. Medications  No. Follow up visit  No.  Do you have questions or concerns about your Care? Patient has questions about if he should resume the 80 mg BID of his PPI or go back down to the 40 mg BID. The report says to resume so I am sending this for confirmation. Please let patient know what MD would like.   Actions: * If pain score is 4 or above: No action needed, pain <4.  1. Have you developed a fever since your procedure? no  2.   Have you had an respiratory symptoms (SOB or cough) since your procedure? no  3.   Have you tested positive for COVID 19 since your procedure no  4.   Have you had any family members/close contacts diagnosed with the COVID 19 since your procedure?  no   If yes to any of these questions please route to Joylene John, RN and Joella Prince, RN

## 2020-05-11 NOTE — Telephone Encounter (Signed)
Thanks for reaching out. I wanted him to be on 40 mg twice daily. Patty or Covering RN can you reach out to patient and ensure he is taking the 40 twice daily as any other dosing would be too high. Thanks. GM

## 2020-05-12 ENCOUNTER — Encounter: Payer: Self-pay | Admitting: Gastroenterology

## 2020-05-24 ENCOUNTER — Other Ambulatory Visit: Payer: Self-pay | Admitting: Gastroenterology

## 2020-07-04 DIAGNOSIS — M5459 Other low back pain: Secondary | ICD-10-CM | POA: Diagnosis not present

## 2020-07-10 ENCOUNTER — Encounter: Payer: Self-pay | Admitting: Nurse Practitioner

## 2020-07-10 ENCOUNTER — Other Ambulatory Visit: Payer: Self-pay

## 2020-07-10 ENCOUNTER — Ambulatory Visit: Payer: BC Managed Care – PPO | Admitting: Nurse Practitioner

## 2020-07-10 VITALS — BP 180/89 | HR 67 | Temp 98.1°F | Resp 20 | Ht 68.0 in | Wt 193.0 lb

## 2020-07-10 DIAGNOSIS — I10 Essential (primary) hypertension: Secondary | ICD-10-CM | POA: Diagnosis not present

## 2020-07-10 DIAGNOSIS — K219 Gastro-esophageal reflux disease without esophagitis: Secondary | ICD-10-CM | POA: Diagnosis not present

## 2020-07-10 DIAGNOSIS — J449 Chronic obstructive pulmonary disease, unspecified: Secondary | ICD-10-CM

## 2020-07-10 DIAGNOSIS — E669 Obesity, unspecified: Secondary | ICD-10-CM

## 2020-07-10 DIAGNOSIS — L242 Irritant contact dermatitis due to solvents: Secondary | ICD-10-CM

## 2020-07-10 DIAGNOSIS — F411 Generalized anxiety disorder: Secondary | ICD-10-CM | POA: Diagnosis not present

## 2020-07-10 DIAGNOSIS — F410 Panic disorder [episodic paroxysmal anxiety] without agoraphobia: Secondary | ICD-10-CM

## 2020-07-10 DIAGNOSIS — E785 Hyperlipidemia, unspecified: Secondary | ICD-10-CM | POA: Diagnosis not present

## 2020-07-10 LAB — BAYER DCA HB A1C WAIVED: HB A1C (BAYER DCA - WAIVED): 6 % (ref <7.0)

## 2020-07-10 LAB — LIPID PANEL

## 2020-07-10 MED ORDER — TRIAMCINOLONE ACETONIDE 0.1 % EX CREA: 1.0000 "application " | TOPICAL_CREAM | Freq: Two times a day (BID) | CUTANEOUS | 0 refills | Status: DC

## 2020-07-10 MED ORDER — OMEPRAZOLE 40 MG PO CPDR: 40.0000 mg | DELAYED_RELEASE_CAPSULE | Freq: Two times a day (BID) | ORAL | 1 refills | Status: DC

## 2020-07-10 MED ORDER — AMLODIPINE BESYLATE 10 MG PO TABS: 10.0000 mg | ORAL_TABLET | Freq: Every day | ORAL | 1 refills | Status: DC

## 2020-07-10 MED ORDER — PRAVASTATIN SODIUM 80 MG PO TABS: 80.0000 mg | ORAL_TABLET | Freq: Every day | ORAL | 1 refills | Status: DC

## 2020-07-10 MED ORDER — HYDROCHLOROTHIAZIDE 25 MG PO TABS: 25.0000 mg | ORAL_TABLET | Freq: Every day | ORAL | 1 refills | Status: DC

## 2020-07-10 MED ORDER — LOSARTAN POTASSIUM 100 MG PO TABS: 100.0000 mg | ORAL_TABLET | Freq: Every day | ORAL | 1 refills | Status: DC

## 2020-07-10 MED ORDER — ESCITALOPRAM OXALATE 10 MG PO TABS: 10.0000 mg | ORAL_TABLET | Freq: Every day | ORAL | 1 refills | Status: DC

## 2020-07-10 MED ORDER — FENOFIBRATE 160 MG PO TABS: 160.0000 mg | ORAL_TABLET | Freq: Every day | ORAL | 1 refills | Status: DC

## 2020-07-10 NOTE — Progress Notes (Signed)
Subjective:    Patient ID: Eric Saunders, male    DOB: August 16, 1956, 64 y.o.   MRN: [id number]  Chief Complaint: medical management of chronic issues     HPI:  1. Essential hypertension BP Readings from Last 3 Encounters:  07/10/20 (!) 180/89  05/09/20 126/61  05/03/20 (!) 152/88  Checks blood pressure at home sometimes, takes medication as prescribed. States BP is usually high in the AM but is lower in the PM. Denies chest pain, SOB, or headaches.   2. Chronic obstructive pulmonary disease, unspecified COPD type ([place]) Does not use inhaler, states it made him feel worse. Smokes 1 pk per day currently.   3. Gastroesophageal reflux disease without esophagitis Had endoscopy, no issues, Takes medication as prescribed, states no complaints at this time.    4. GAD (generalized anxiety disorder) GAD 7 : Generalized Anxiety Score 07/10/2020 10/07/2019  Nervous, Anxious, on Edge 1 1  Control/stop worrying 1 1  Worry too much - different things 1 1  Trouble relaxing 1 0  Restless 0 0  Easily annoyed or irritable 1 2  Afraid - awful might happen 1 0  Total GAD 7 Score 6 5  Anxiety Difficulty Not difficult at all Somewhat difficult  Takes Xanax every other day. States he takes them regularly.     5. Obesity (BMI 30.0-34.9) Wt Readings from Last 3 Encounters:  07/10/20 193 lb (87.5 kg)  05/09/20 194 lb (88 kg)  05/03/20 192 lb (87.1 kg)   BMI Readings from Last 3 Encounters:  07/10/20 29.35 kg/m  05/09/20 29.50 kg/m  05/03/20 29.19 kg/m  No formal exercise, walks often and does yard work.    6. Hyperlipidemia with target LDL less than 100 Lab Results  Component Value Date   CHOL 167 01/04/2020   HDL 30 (L) 01/04/2020   LDLCALC 73 01/04/2020   TRIG 318 (H) 01/04/2020   CHOLHDL 5.6 01/04/2020  Takes medication as prescribed. Does not watch diet, states he grills often and chooses leaner meats.     Outpatient Encounter Medications as of 07/10/2020  Medication Sig  .  ALPRAZolam (XANAX) 0.5 MG tablet Take 1 tablet (0.5 mg total) by mouth 2 (two) times daily as needed for anxiety.  Marland Kitchen amLODipine (NORVASC) 10 MG tablet Take 1 tablet (10 mg total) by mouth daily.  Marland Kitchen aspirin 81 MG tablet Take 81 mg by mouth daily.  . cyclobenzaprine (FLEXERIL) 10 MG tablet Take 10 mg by mouth 2 (two) times daily as needed.  Marland Kitchen escitalopram (LEXAPRO) 10 MG tablet Take 1 tablet (10 mg total) by mouth daily.  . fenofibrate 160 MG tablet Take 1 tablet (160 mg total) by mouth daily.  . fluticasone furoate-vilanterol (BREO ELLIPTA) 100-25 MCG/INH AEPB Inhale 1 puff into the lungs every morning. (Patient not taking: Reported on 05/03/2020)  . hydrochlorothiazide (HYDRODIURIL) 25 MG tablet Take 1 tablet (25 mg total) by mouth daily. (Patient not taking: Reported on 05/09/2020)  . ibuprofen (ADVIL) 600 MG tablet Take 800 mg by mouth every 6 (six) hours as needed.  Marland Kitchen losartan (COZAAR) 100 MG tablet Take 1 tablet (100 mg total) by mouth daily.  . metoprolol succinate (TOPROL-XL) 25 MG 24 hr tablet Take 1 tablet (25 mg total) by mouth daily.  Marland Kitchen omeprazole (PRILOSEC) 40 MG capsule TAKE 1 CAPSULE BY MOUTH TWICE A DAY  . pravastatin (PRAVACHOL) 80 MG tablet Take 80 mg by mouth daily. Take 1/2 tablet daily   No facility-administered encounter medications on file as  of 07/10/2020.    Past Surgical History:  Procedure Laterality Date  . COLONOSCOPY    . CORONARY ARTERY BYPASS GRAFT N/A 06/17/2014   Procedure: CORONARY ARTERY BYPASS GRAFTING (CABG) X 4, RIGHT LEG EVH;  Surgeon: Gaye Pollack, MD;  Location: [facility];  Service: Open Heart Surgery;  Laterality: N/A;  . KNEE SURGERY     bilateral arthroscopic  . LEFT HEART CATHETERIZATION WITH CORONARY ANGIOGRAM N/A 06/16/2014   Procedure: LEFT HEART CATHETERIZATION WITH CORONARY ANGIOGRAM;  Surgeon: Burnell Blanks, MD;  Location: [facility] CATH LAB;  Service: Cardiovascular;  Laterality: N/A;  . TEE WITHOUT CARDIOVERSION N/A 06/17/2014   Procedure:  TRANSESOPHAGEAL ECHOCARDIOGRAM (TEE);  Surgeon: Gaye Pollack, MD;  Location: [facility];  Service: Open Heart Surgery;  Laterality: N/A;  . UPPER GASTROINTESTINAL ENDOSCOPY      Family History  Problem Relation Age of Onset  . Drug abuse Sister   . Healthy Brother   . Diabetes Sister   . Healthy Sister   . Healthy Sister   . Healthy Sister   . Healthy Sister   . Healthy Brother   . Colon cancer Neg Hx   . Esophageal cancer Neg Hx   . Inflammatory bowel disease Neg Hx   . Liver disease Neg Hx   . Pancreatic cancer Neg Hx   . Rectal cancer Neg Hx   . Stomach cancer Neg Hx     New complaints: Dry/scaly skin that is cracking and itching x 1 month, Aquaphor and topical itching cream with some relief.   Social history: Lives at home with wife. Travels for fun, has place in [place].   Controlled substance contract: signed 07/10/2020     Review of Systems  Constitutional: Negative.   HENT: Positive for tinnitus.   Eyes: Negative.   Respiratory: Negative.   Cardiovascular: Negative.   Gastrointestinal: Negative.   Endocrine: Negative.   Genitourinary: Negative.   Musculoskeletal: Negative.   Skin: Positive for color change and rash.  Allergic/Immunologic: Negative.   Neurological: Negative.   Hematological: Negative.   Psychiatric/Behavioral: Negative.   All other systems reviewed and are negative.      Objective:   Physical Exam Vitals and nursing note reviewed.  Constitutional:      Appearance: Normal appearance.  HENT:     Head: Normocephalic and atraumatic.     Right Ear: Tympanic membrane, ear canal and external ear normal.     Left Ear: Tympanic membrane, ear canal and external ear normal.     Nose: Nose normal.     Mouth/Throat:     Mouth: Mucous membranes are moist.     Pharynx: Oropharynx is clear.  Eyes:     Extraocular Movements: Extraocular movements intact.     Conjunctiva/sclera: Conjunctivae normal.     Pupils: Pupils are equal, round, and reactive  to light.  Cardiovascular:     Rate and Rhythm: Normal rate and regular rhythm.     Pulses: Normal pulses.     Heart sounds: Normal heart sounds.  Pulmonary:     Effort: Pulmonary effort is normal.     Breath sounds: Normal breath sounds.  Abdominal:     General: Abdomen is flat. Bowel sounds are normal.     Palpations: Abdomen is soft.  Musculoskeletal:        General: Normal range of motion.     Cervical back: Normal range of motion.  Skin:    General: Skin is warm and dry.  Capillary Refill: Capillary refill takes less than 2 seconds.     Findings: Rash: dry cracked open erythema of bil hands.     Comments: Bilat rash from elbows to hands distally  Neurological:     General: No focal deficit present.     Mental Status: He is alert and oriented to person, place, and time. Mental status is at baseline.  Psychiatric:        Mood and Affect: Mood normal.        Behavior: Behavior normal.        Thought Content: Thought content normal.        Judgment: Judgment normal.     BP (!) 180/89   Pulse 67   Temp 98.1 F (36.7 C) (Temporal)   Resp 20   Ht '5\' 8"'  (1.727 m)   Wt 193 lb (87.5 kg)   SpO2 98%   BMI 29.35 kg/m       Assessment & Plan:  Eric Saunders comes in today with chief complaint of No chief complaint on file.   Diagnosis and orders addressed:  1. Essential hypertension Take medication as prescribed and take blood pressure regularly. Avoid foods that are high in salt and eat a heart healthy diet.   2. Chronic obstructive pulmonary disease, unspecified COPD type ([place]) Use inhaler as prescribed. Report any new or worsening symptoms of shortness of breath.   3. Gastroesophageal reflux disease without esophagitis Take medication as prescribed. Avoid trigger foods such as spicy or fried foods. Eat smaller meals more frequently verses large meals.   4. GAD (generalized anxiety disorder) Take medication as prescribed. Report any new or worsening symptoms.     5. Obesity (BMI 30.0-34.9) Exercise regularly. Walking is a great cardiovascular exercise that helps lower blood pressure, cholesterol levels, and keeps weight down.   6. Hyperlipidemia with target LDL less than 100 Take medication as prescribed. Avoid foods that are high in fat or fried.  triamcinolone cream with eucerin cream topically BID  7. Contact dermatitis bil hands  Meds ordered this encounter  Medications  . escitalopram (LEXAPRO) 10 MG tablet    Sig: Take 1 tablet (10 mg total) by mouth daily.    Dispense:  90 tablet    Refill:  1    Order Specific Question:   Supervising Provider    Answer:   Caryl Pina A [id number]  . hydrochlorothiazide (HYDRODIURIL) 25 MG tablet    Sig: Take 1 tablet (25 mg total) by mouth daily.    Dispense:  90 tablet    Refill:  1    Order Specific Question:   Supervising Provider    Answer:   Caryl Pina A [id number]  . losartan (COZAAR) 100 MG tablet    Sig: Take 1 tablet (100 mg total) by mouth daily.    Dispense:  90 tablet    Refill:  1    Order Specific Question:   Supervising Provider    Answer:   Caryl Pina A [id number]  . amLODipine (NORVASC) 10 MG tablet    Sig: Take 1 tablet (10 mg total) by mouth daily.    Dispense:  90 tablet    Refill:  1    Order Specific Question:   Supervising Provider    Answer:   Caryl Pina A [id number]  . fenofibrate 160 MG tablet    Sig: Take 1 tablet (160 mg total) by mouth daily.    Dispense:  90 tablet    Refill:  1    Order Specific Question:   Supervising Provider    Answer:   Caryl Pina A [id number]  . omeprazole (PRILOSEC) 40 MG capsule    Sig: Take 1 capsule (40 mg total) by mouth 2 (two) times daily.    Dispense:  180 capsule    Refill:  1    Order Specific Question:   Supervising Provider    Answer:   Caryl Pina A [id number]  . pravastatin (PRAVACHOL) 80 MG tablet    Sig: Take 1 tablet (80 mg total) by mouth daily. Take 1/2 tablet daily     Dispense:  90 tablet    Refill:  1    Order Specific Question:   Supervising Provider    Answer:   Caryl Pina A [[id number]]   Orders Placed This Encounter  Procedures  . CMP14+EGFR  . CBC with Differential/Platelet  . Lipid panel  . Bayer DCA Hb A1c Waived    Labs pending Health Maintenance reviewed Diet and exercise encouraged  Follow up plan: Follow up in 6 months.    Mary-Margaret Hassell Done, FNP

## 2020-07-10 NOTE — Patient Instructions (Signed)
DASH Eating Plan DASH stands for "Dietary Approaches to Stop Hypertension." The DASH eating plan is a healthy eating plan that has been shown to reduce high blood pressure (hypertension). It may also reduce your risk for type 2 diabetes, heart disease, and stroke. The DASH eating plan may also help with weight loss. What are tips for following this plan?  General guidelines  Avoid eating more than 2,300 mg (milligrams) of salt (sodium) a day. If you have hypertension, you may need to reduce your sodium intake to 1,500 mg a day.  Limit alcohol intake to no more than 1 drink a day for nonpregnant women and 2 drinks a day for men. One drink equals 12 oz of beer, 5 oz of wine, or 1 oz of hard liquor.  Work with your health care provider to maintain a healthy body weight or to lose weight. Ask what an ideal weight is for you.  Get at least 30 minutes of exercise that causes your heart to beat faster (aerobic exercise) most days of the week. Activities may include walking, swimming, or biking.  Work with your health care provider or diet and nutrition specialist (dietitian) to adjust your eating plan to your individual calorie needs. Reading food labels   Check food labels for the amount of sodium per serving. Choose foods with less than 5 percent of the Daily Value of sodium. Generally, foods with less than 300 mg of sodium per serving fit into this eating plan.  To find whole grains, look for the word "whole" as the first word in the ingredient list. Shopping  Buy products labeled as "low-sodium" or "no salt added."  Buy fresh foods. Avoid canned foods and premade or frozen meals. Cooking  Avoid adding salt when cooking. Use salt-free seasonings or herbs instead of table salt or sea salt. Check with your health care provider or pharmacist before using salt substitutes.  Do not fry foods. Cook foods using healthy methods such as baking, boiling, grilling, and broiling instead.  Cook with  heart-healthy oils, such as olive, canola, soybean, or sunflower oil. Meal planning  Eat a balanced diet that includes: ? 5 or more servings of fruits and vegetables each day. At each meal, try to fill half of your plate with fruits and vegetables. ? Up to 6-8 servings of whole grains each day. ? Less than 6 oz of lean meat, poultry, or fish each day. A 3-oz serving of meat is about the same size as a deck of cards. One egg equals 1 oz. ? 2 servings of low-fat dairy each day. ? A serving of nuts, seeds, or beans 5 times each week. ? Heart-healthy fats. Healthy fats called Omega-3 fatty acids are found in foods such as flaxseeds and coldwater fish, like sardines, salmon, and mackerel.  Limit how much you eat of the following: ? Canned or prepackaged foods. ? Food that is high in trans fat, such as fried foods. ? Food that is high in saturated fat, such as fatty meat. ? Sweets, desserts, sugary drinks, and other foods with added sugar. ? Full-fat dairy products.  Do not salt foods before eating.  Try to eat at least 2 vegetarian meals each week.  Eat more home-cooked food and less restaurant, buffet, and fast food.  When eating at a restaurant, ask that your food be prepared with less salt or no salt, if possible. What foods are recommended? The items listed may not be a complete list. Talk with your dietitian about   what dietary choices are best for you. Grains Whole-grain or whole-wheat bread. Whole-grain or whole-wheat pasta. Brown rice. Oatmeal. Quinoa. Bulgur. Whole-grain and low-sodium cereals. Pita bread. Low-fat, low-sodium crackers. Whole-wheat flour tortillas. Vegetables Fresh or frozen vegetables (raw, steamed, roasted, or grilled). Low-sodium or reduced-sodium tomato and vegetable juice. Low-sodium or reduced-sodium tomato sauce and tomato paste. Low-sodium or reduced-sodium canned vegetables. Fruits All fresh, dried, or frozen fruit. Canned fruit in natural juice (without  added sugar). Meat and other protein foods Skinless chicken or turkey. Ground chicken or turkey. Pork with fat trimmed off. Fish and seafood. Egg whites. Dried beans, peas, or lentils. Unsalted nuts, nut butters, and seeds. Unsalted canned beans. Lean cuts of beef with fat trimmed off. Low-sodium, lean deli meat. Dairy Low-fat (1%) or fat-free (skim) milk. Fat-free, low-fat, or reduced-fat cheeses. Nonfat, low-sodium ricotta or cottage cheese. Low-fat or nonfat yogurt. Low-fat, low-sodium cheese. Fats and oils Soft margarine without trans fats. Vegetable oil. Low-fat, reduced-fat, or light mayonnaise and salad dressings (reduced-sodium). Canola, safflower, olive, soybean, and sunflower oils. Avocado. Seasoning and other foods Herbs. Spices. Seasoning mixes without salt. Unsalted popcorn and pretzels. Fat-free sweets. What foods are not recommended? The items listed may not be a complete list. Talk with your dietitian about what dietary choices are best for you. Grains Baked goods made with fat, such as croissants, muffins, or some breads. Dry pasta or rice meal packs. Vegetables Creamed or fried vegetables. Vegetables in a cheese sauce. Regular canned vegetables (not low-sodium or reduced-sodium). Regular canned tomato sauce and paste (not low-sodium or reduced-sodium). Regular tomato and vegetable juice (not low-sodium or reduced-sodium). Pickles. Olives. Fruits Canned fruit in a light or heavy syrup. Fried fruit. Fruit in cream or butter sauce. Meat and other protein foods Fatty cuts of meat. Ribs. Fried meat. Bacon. Sausage. Bologna and other processed lunch meats. Salami. Fatback. Hotdogs. Bratwurst. Salted nuts and seeds. Canned beans with added salt. Canned or smoked fish. Whole eggs or egg yolks. Chicken or turkey with skin. Dairy Whole or 2% milk, cream, and half-and-half. Whole or full-fat cream cheese. Whole-fat or sweetened yogurt. Full-fat cheese. Nondairy creamers. Whipped toppings.  Processed cheese and cheese spreads. Fats and oils Butter. Stick margarine. Lard. Shortening. Ghee. Bacon fat. Tropical oils, such as coconut, palm kernel, or palm oil. Seasoning and other foods Salted popcorn and pretzels. Onion salt, garlic salt, seasoned salt, table salt, and sea salt. Worcestershire sauce. Tartar sauce. Barbecue sauce. Teriyaki sauce. Soy sauce, including reduced-sodium. Steak sauce. Canned and packaged gravies. Fish sauce. Oyster sauce. Cocktail sauce. Horseradish that you find on the shelf. Ketchup. Mustard. Meat flavorings and tenderizers. Bouillon cubes. Hot sauce and Tabasco sauce. Premade or packaged marinades. Premade or packaged taco seasonings. Relishes. Regular salad dressings. Where to find more information:  National Heart, Lung, and Blood Institute: www.nhlbi.nih.gov  American Heart Association: www.heart.org Summary  The DASH eating plan is a healthy eating plan that has been shown to reduce high blood pressure (hypertension). It may also reduce your risk for type 2 diabetes, heart disease, and stroke.  With the DASH eating plan, you should limit salt (sodium) intake to 2,300 mg a day. If you have hypertension, you may need to reduce your sodium intake to 1,500 mg a day.  When on the DASH eating plan, aim to eat more fresh fruits and vegetables, whole grains, lean proteins, low-fat dairy, and heart-healthy fats.  Work with your health care provider or diet and nutrition specialist (dietitian) to adjust your eating plan to your   individual calorie needs. This information is not intended to replace advice given to you by your health care provider. Make sure you discuss any questions you have with your health care provider. Document Revised: 08/15/2017 Document Reviewed: 08/26/2016 Elsevier Patient Education  2020 Elsevier Inc.  

## 2020-07-11 LAB — LIPID PANEL
Chol/HDL Ratio: 4.1 ratio (ref 0.0–5.0)
Cholesterol, Total: 224 mg/dL — ABNORMAL HIGH (ref 100–199)
HDL: 54 mg/dL (ref >39)
LDL Chol Calc (NIH): 139 mg/dL — ABNORMAL HIGH (ref 0–99)
Triglycerides: 171 mg/dL — ABNORMAL HIGH (ref 0–149)
VLDL Cholesterol Cal: 31 mg/dL (ref 5–40)

## 2020-07-11 LAB — CMP14+EGFR
ALT: 15 IU/L (ref 0–44)
AST: 13 IU/L (ref 0–40)
Albumin/Globulin Ratio: 1.7 (ref 1.2–2.2)
Albumin: 4.8 g/dL (ref 3.8–4.8)
Alkaline Phosphatase: 61 IU/L (ref 44–121)
BUN/Creatinine Ratio: 21 (ref 10–24)
BUN: 22 mg/dL (ref 8–27)
Bilirubin Total: 0.4 mg/dL (ref 0.0–1.2)
CO2: 23 mmol/L (ref 20–29)
Calcium: 9.9 mg/dL (ref 8.6–10.2)
Chloride: 100 mmol/L (ref 96–106)
Creatinine, Ser: 1.03 mg/dL (ref 0.76–1.27)
GFR calc Af Amer: 90 mL/min/{1.73_m2} (ref >59)
GFR calc non Af Amer: 77 mL/min/{1.73_m2} (ref >59)
Globulin, Total: 2.9 g/dL (ref 1.5–4.5)
Glucose: 80 mg/dL (ref 65–99)
Potassium: 4.1 mmol/L (ref 3.5–5.2)
Sodium: 141 mmol/L (ref 134–144)
Total Protein: 7.7 g/dL (ref 6.0–8.5)

## 2020-07-11 LAB — CBC WITH DIFFERENTIAL/PLATELET
Basophils Absolute: 0.1 10*3/uL (ref 0.0–0.2)
Basos: 1 %
EOS (ABSOLUTE): 0.1 10*3/uL (ref 0.0–0.4)
Eos: 1 %
Hematocrit: 48.5 % (ref 37.5–51.0)
Hemoglobin: 16.2 g/dL (ref 13.0–17.7)
Immature Grans (Abs): 0.1 10*3/uL (ref 0.0–0.1)
Immature Granulocytes: 1 %
Lymphocytes Absolute: 3.7 10*3/uL — ABNORMAL HIGH (ref 0.7–3.1)
Lymphs: 29 %
MCH: 29.2 pg (ref 26.6–33.0)
MCHC: 33.4 g/dL (ref 31.5–35.7)
MCV: 87 fL (ref 79–97)
Monocytes Absolute: 1 10*3/uL — ABNORMAL HIGH (ref 0.1–0.9)
Monocytes: 8 %
Neutrophils Absolute: 7.9 10*3/uL — ABNORMAL HIGH (ref 1.4–7.0)
Neutrophils: 60 %
Platelets: 441 10*3/uL (ref 150–450)
RBC: 5.55 x10E6/uL (ref 4.14–5.80)
RDW: 13.3 % (ref 11.6–15.4)
WBC: 12.8 10*3/uL — ABNORMAL HIGH (ref 3.4–10.8)

## 2020-07-25 DIAGNOSIS — M5459 Other low back pain: Secondary | ICD-10-CM | POA: Diagnosis not present

## 2020-07-27 ENCOUNTER — Other Ambulatory Visit: Payer: Self-pay | Admitting: Nurse Practitioner

## 2020-07-27 DIAGNOSIS — F410 Panic disorder [episodic paroxysmal anxiety] without agoraphobia: Secondary | ICD-10-CM

## 2020-07-27 MED ORDER — ALPRAZOLAM 0.5 MG PO TABS: 0.5000 mg | ORAL_TABLET | Freq: Two times a day (BID) | ORAL | 2 refills | Status: DC | PRN

## 2020-08-03 DIAGNOSIS — M5136 Other intervertebral disc degeneration, lumbar region: Secondary | ICD-10-CM | POA: Diagnosis not present

## 2020-10-12 ENCOUNTER — Encounter (HOSPITAL_COMMUNITY): Payer: Self-pay

## 2020-10-12 ENCOUNTER — Other Ambulatory Visit (HOSPITAL_COMMUNITY): Payer: Self-pay

## 2020-10-12 DIAGNOSIS — Z87891 Personal history of nicotine dependence: Secondary | ICD-10-CM

## 2020-10-12 DIAGNOSIS — Z122 Encounter for screening for malignant neoplasm of respiratory organs: Secondary | ICD-10-CM

## 2020-10-12 NOTE — Progress Notes (Signed)
Call placed to patient to schedule follow-up LDCT. Unable to reach patient at this time, wife reports he is unavailable. She will have him call me back to schedule.

## 2020-10-31 ENCOUNTER — Encounter: Payer: Self-pay | Admitting: Family Medicine

## 2020-10-31 ENCOUNTER — Ambulatory Visit (INDEPENDENT_AMBULATORY_CARE_PROVIDER_SITE_OTHER): Payer: BC Managed Care – PPO | Admitting: Family Medicine

## 2020-10-31 DIAGNOSIS — R059 Cough, unspecified: Secondary | ICD-10-CM | POA: Diagnosis not present

## 2020-10-31 DIAGNOSIS — R52 Pain, unspecified: Secondary | ICD-10-CM

## 2020-10-31 LAB — VERITOR FLU A/B WAIVED
Influenza A: NEGATIVE
Influenza B: NEGATIVE

## 2020-10-31 MED ORDER — BENZONATATE 100 MG PO CAPS: 100.0000 mg | ORAL_CAPSULE | Freq: Three times a day (TID) | ORAL | 0 refills | Status: DC | PRN

## 2020-10-31 MED ORDER — PREDNISONE 10 MG (21) PO TBPK: ORAL_TABLET | ORAL | 0 refills | Status: DC

## 2020-10-31 NOTE — Addendum Note (Signed)
Addended by: Collier Bullock on: 10/31/2020 01:29 PM   Modules accepted: Orders

## 2020-10-31 NOTE — Progress Notes (Signed)
Virtual Visit via telephone Note Due to COVID-19 pandemic this visit was conducted virtually. This visit type was conducted due to national recommendations for restrictions regarding the COVID-19 Pandemic (e.g. social distancing, sheltering in place) in an effort to limit this patient's exposure and mitigate transmission in our community. All issues noted in this document were discussed and addressed.  A physical exam was not performed with this format.  I connected with Eric Saunders on 10/31/20 at 1237 by telephone and verified that I am speaking with the correct person using two identifiers. Eric Saunders is currently located at home and no one is currently with hime during visit. The provider, Gwenlyn Perking, FNP is located in their office at time of visit.  I discussed the limitations, risks, security and privacy concerns of performing an evaluation and management service by telephone and the availability of in person appointments. I also discussed with the patient that there may be a patient responsible charge related to this service. The patient expressed understanding and agreed to proceed.  CC: cough  History and Present Illness:  HPI  Randal reports a cough, congestion, and body aches x 4 days. His reports yellow phlegm. He is taking coricidin with mild improvement. He is a smoker. He denies fever, shortness of breath, chest pain, wheezing, headaches, changes in taste or smell. He has not been vaccinated against Covid. He denies known exposure. He feels like he has the flu.     ROS As per HPI.   Observations/Objective: Alert and oriented x 3. Able to speak in full sentences without difficulty.   Assessment and Plan: Tray was seen today for cough.  Diagnoses and all orders for this visit:  Cough Likely viral, no fever, shortness of breath, or chest pain. Patient will come to the office for covid and flu testing. Quarantine until [employer]. Tessalon perles for cough.  Prednisone dose pack given. Plain mucinex and nasal saline for congestion. Stay well hydrated, rest. Return to office for new or worsening symptoms, or if symptoms persist.  -     Novel Coronavirus, NAA ([facility]); Future -     Veritor Flu A/B Waived; Future -     benzonatate (TESSALON PERLES) 100 MG capsule; Take 1 capsule (100 mg total) by mouth 3 (three) times daily as needed for cough. -     predniSONE (STERAPRED UNI-PAK 21 TAB) 10 MG (21) TBPK tablet; Use as directed on back of pill pack  Body aches No fever. Labs pending as below. Tylenol as needed for pain.  -     Novel Coronavirus, NAA ([facility]); Future -     Veritor Flu A/B Waived; Future  Follow Up Instructions: Return to office for new or worsening symptoms, or if symptoms persist.    I discussed the assessment and treatment plan with the patient. The patient was provided an opportunity to ask questions and all were answered. The patient agreed with the plan and demonstrated an understanding of the instructions.   The patient was advised to call back or seek an in-person evaluation if the symptoms worsen or if the condition fails to improve as anticipated.  The above assessment and management plan was discussed with the patient. The patient verbalized understanding of and has agreed to the management plan. Patient is aware to call the clinic if symptoms persist or worsen. Patient is aware when to return to the clinic for a follow-up visit. Patient educated on when it is appropriate to go to the  emergency department.   Time call ended:  1245  I provided 8 minutes of phone time during this encounter.   Gwenlyn Perking, FNP

## 2020-11-01 LAB — NOVEL CORONAVIRUS, NAA: SARS-CoV-2, NAA: NOT DETECTED

## 2020-11-01 LAB — SARS-COV-2, NAA 2 DAY TAT

## 2020-11-06 NOTE — Progress Notes (Signed)
Cardiology Office Note   Date:  11/08/2020   ID:  Barnabas, Henriques 01-Sep-1956, MRN [id number]  PCP:  [name], FNP  Cardiologist:   Minus Breeding, MD   Chief Complaint  Patient presents with  . Coronary Artery Disease     History of Present Illness: Eric Saunders is a 65 y.o. male who presents for followup after CABG.  Last year he had some increased dyspnea.  I sent him for a POET (Plain Old Exercise Treadmill) and he had no evidence of ischemia.   He did have a bit of a hypertensive blood pressure response.  He was hospitalized earlier last year with syncope.  He has had about five or six episodes in the last 6 months.  These are always associated with cough.  He had an EEG which was negative.  MRI brain was unremarkable.  Carotid Dopplers were unremarkable.  He is being treated with higher dose Prilosec 40 mg twice daily and is having less cough and less syncope.  He had his esophagus stretched.    He has not had any syncope since Thanksgiving.  He has less cough although he will still get choked when he is trying to sometimes swallow his saliva or coughed up some sputum.  He has not had any new chest pressure, neck or arm discomfort.  Is not had any new shortness of breath, PND or orthopnea.  He quit smoking for about a week around his birthday.  He has been bothered by anxiety and he thinks depression.   Past Medical History:  Diagnosis Date  . Arthritis   . COPD (chronic obstructive pulmonary disease) ([place])   . Coronary artery disease   . Depression   . GERD (gastroesophageal reflux disease)   . Hyperlipemia   . Hypertension   . Panic disorder   . Tobacco abuse     Past Surgical History:  Procedure Laterality Date  . COLONOSCOPY    . CORONARY ARTERY BYPASS GRAFT N/A 06/17/2014   Procedure: CORONARY ARTERY BYPASS GRAFTING (CABG) X 4, RIGHT LEG EVH;  Surgeon: Gaye Pollack, MD;  Location: [facility];  Service: Open Heart Surgery;  Laterality: N/A;  . KNEE  SURGERY     bilateral arthroscopic  . LEFT HEART CATHETERIZATION WITH CORONARY ANGIOGRAM N/A 06/16/2014   Procedure: LEFT HEART CATHETERIZATION WITH CORONARY ANGIOGRAM;  Surgeon: Burnell Blanks, MD;  Location: [facility] CATH LAB;  Service: Cardiovascular;  Laterality: N/A;  . TEE WITHOUT CARDIOVERSION N/A 06/17/2014   Procedure: TRANSESOPHAGEAL ECHOCARDIOGRAM (TEE);  Surgeon: Gaye Pollack, MD;  Location: [facility];  Service: Open Heart Surgery;  Laterality: N/A;  . UPPER GASTROINTESTINAL ENDOSCOPY       Current Outpatient Medications  Medication Sig Dispense Refill  . ALPRAZolam (XANAX) 0.5 MG tablet Take 1 tablet (0.5 mg total) by mouth 2 (two) times daily as needed for anxiety. 60 tablet 2  . amLODipine (NORVASC) 10 MG tablet Take 1 tablet (10 mg total) by mouth daily. 90 tablet 1  . aspirin 81 MG tablet Take 81 mg by mouth daily.    . benzonatate (TESSALON PERLES) 100 MG capsule Take 1 capsule (100 mg total) by mouth 3 (three) times daily as needed for cough. 20 capsule 0  . cyclobenzaprine (FLEXERIL) 10 MG tablet Take 10 mg by mouth 2 (two) times daily as needed.    Marland Kitchen escitalopram (LEXAPRO) 10 MG tablet Take 1 tablet (10 mg total) by mouth daily. 90 tablet 1  .  fenofibrate 160 MG tablet Take 1 tablet (160 mg total) by mouth daily. 90 tablet 1  . hydrochlorothiazide (HYDRODIURIL) 25 MG tablet Take 1 tablet (25 mg total) by mouth daily. 90 tablet 1  . ibuprofen (ADVIL) 600 MG tablet Take 800 mg by mouth every 6 (six) hours as needed.    Marland Kitchen losartan (COZAAR) 100 MG tablet Take 1 tablet (100 mg total) by mouth daily. 90 tablet 1  . metoprolol succinate (TOPROL-XL) 25 MG 24 hr tablet Take 1 tablet (25 mg total) by mouth daily. 90 tablet 1  . omeprazole (PRILOSEC) 40 MG capsule Take 1 capsule (40 mg total) by mouth 2 (two) times daily. 180 capsule 1  . predniSONE (STERAPRED UNI-PAK 21 TAB) 10 MG (21) TBPK tablet Use as directed on back of pill pack 21 tablet 0  . rosuvastatin (CRESTOR) 20 MG  tablet Take 1 tablet (20 mg total) by mouth daily. 90 tablet 3  . triamcinolone cream (KENALOG) 0.1 % Apply 1 application topically 2 (two) times daily. 30 g 0   No current facility-administered medications for this visit.    Allergies:   Ace inhibitors and Other    ROS:  Please see the history of present illness.   Otherwise, review of systems are positive for none.   All other systems are reviewed and negative.    PHYSICAL EXAM: VS:  BP (!) 146/82   Pulse (!) 51   Ht 5\' 8"  (1.727 m)   Wt 205 lb (93 kg)   BMI 31.17 kg/m  , BMI Body mass index is 31.17 kg/m. GENERAL:  Well appearing NECK:  No jugular venous distention, waveform within normal limits, carotid upstroke brisk and symmetric, no bruits, no thyromegaly LUNGS:  Clear to auscultation bilaterally CHEST:  Well healed sternotomy scar. HEART:  PMI not displaced or sustained,S1 and S2 within normal limits, no S3, no S4, no clicks, no rubs, no murmurs ABD:  Flat, positive bowel sounds normal in frequency in pitch, no bruits, no rebound, no guarding, no midline pulsatile mass, no hepatomegaly, no splenomegaly EXT:  2 plus pulses throughout, no edema, no cyanosis no clubbing   EKG:  EKG is 51 ordered today. Sinus rhythm, rate 51, axis within normal limits, intervals within normal limits, no acute ST-T wave changes.  Recent Labs: 01/03/2020: TSH 0.841 07/10/2020: ALT 15; BUN 22; Creatinine, Ser 1.03; Hemoglobin 16.2; Platelets 441; Potassium 4.1; Sodium 141    Lipid Panel    Component Value Date/Time   CHOL 224 (H) 07/10/2020 0901   CHOL 166 12/28/2012 0932   TRIG 171 (H) 07/10/2020 0901   TRIG 157 (H) 11/23/2014 1549   TRIG 129 12/28/2012 0932   HDL 54 07/10/2020 0901   HDL 44 11/23/2014 1549   HDL 42 12/28/2012 0932   CHOLHDL 4.1 07/10/2020 0901   CHOLHDL 5.6 01/04/2020 0210   VLDL 64 (H) 01/04/2020 0210   LDLCALC 139 (H) 07/10/2020 0901   LDLCALC 90 07/06/2014 0000   LDLCALC 98 12/28/2012 0932      Wt  Readings from Last 3 Encounters:  11/08/20 205 lb (93 kg)  07/10/20 193 lb (87.5 kg)  05/09/20 194 lb (88 kg)      Other studies Reviewed: Additional studies/ records that were reviewed today include:  None Review of the above records demonstrates:  Please see elsewhere in the note.     ASSESSMENT AND PLAN:  CAD/CABG:   The patient has no new sypmtoms.  No further cardiovascular testing is indicated.  We will continue with aggressive risk reduction and meds as listed.  SYNCOPE:     This is cough syncope and has been managed with his GI treatments as above.  He needs to stop smoking as well but he does not think he can do this.   TOBACCO:   Again we talked about this today.  He does not want therapy for this as he thinks he can quit because of his anxiety.  DYSLIPIDEMIA:   LDL was 139 in October.  I am going to stop his pravastatin and start Crestor 20 mg daily.  He had some hand discomfort on Lipitor so I will avoid this.   HTN:  The blood pressure is mildly elevated today but this is unusual.  No change in therapy.  He will keep a check on his blood pressure.    Current medicines are reviewed at length with the patient today.  The patient does not have concerns regarding medicines.  The following changes have been made: As above  Labs/ tests ordered today include:   Orders Placed This Encounter  Procedures  . Hepatic function panel  . Lipid panel  . EKG 12-Lead     Disposition:   FU with me in 12 months.    Signed, Minus Breeding, MD  11/08/2020 10:03 AM    [facility]

## 2020-11-07 ENCOUNTER — Other Ambulatory Visit: Payer: Self-pay | Admitting: Nurse Practitioner

## 2020-11-07 DIAGNOSIS — L242 Irritant contact dermatitis due to solvents: Secondary | ICD-10-CM

## 2020-11-08 ENCOUNTER — Other Ambulatory Visit: Payer: Self-pay

## 2020-11-08 ENCOUNTER — Encounter: Payer: Self-pay | Admitting: Cardiology

## 2020-11-08 ENCOUNTER — Ambulatory Visit: Payer: BC Managed Care – PPO | Admitting: Cardiology

## 2020-11-08 VITALS — BP 146/82 | HR 51 | Ht 68.0 in | Wt 205.0 lb

## 2020-11-08 DIAGNOSIS — I2510 Atherosclerotic heart disease of native coronary artery without angina pectoris: Secondary | ICD-10-CM | POA: Diagnosis not present

## 2020-11-08 DIAGNOSIS — E785 Hyperlipidemia, unspecified: Secondary | ICD-10-CM | POA: Diagnosis not present

## 2020-11-08 DIAGNOSIS — R55 Syncope and collapse: Secondary | ICD-10-CM | POA: Diagnosis not present

## 2020-11-08 DIAGNOSIS — Z720 Tobacco use: Secondary | ICD-10-CM

## 2020-11-08 DIAGNOSIS — Z79899 Other long term (current) drug therapy: Secondary | ICD-10-CM

## 2020-11-08 MED ORDER — ROSUVASTATIN CALCIUM 20 MG PO TABS: 20.0000 mg | ORAL_TABLET | Freq: Every day | ORAL | 3 refills | Status: DC

## 2020-11-08 NOTE — Patient Instructions (Signed)
Medication Instructions:  Please discontinue your Pravastatin and start Crestor 20 mg a day.  Continue all other medications as listed.  *If you need a refill on your cardiac medications before your next appointment, please call your pharmacy*  Lab Work: Please have blood work in 10 weeks at [facility] (lipid/liver)  If you have labs (blood work) drawn today and your tests are completely normal, you will receive your results only by: Marland Kitchen MyChart Message (if you have MyChart) OR . A paper copy in the mail If you have any lab test that is abnormal or we need to change your treatment, we will call you to review the results.  Follow-Up: At [facility], you and your health needs are our priority.  As part of our continuing mission to provide you with exceptional heart care, we have created designated Yanely Mast Care Teams.  These Care Teams include your primary Cardiologist (physician) and Advanced Practice Providers (APPs -  Physician Assistants and Nurse Practitioners) who all work together to provide you with the care you need, when you need it.  We recommend signing up for the patient portal called "MyChart".  Sign up information is provided on this After Visit Summary.  MyChart is used to connect with patients for Virtual Visits (Telemedicine).  Patients are able to view lab/test results, encounter notes, upcoming appointments, etc.  Non-urgent messages can be sent to your [facility] Bremer as well.   To learn more about what you can do with MyChart, go to [email or web address].    Your next appointment:   12 month(s)  The format for your next appointment:   In Person  Judy Goodenow:   Minus Breeding, MD   Thank you for choosing [facility]!!

## 2020-11-09 ENCOUNTER — Encounter (HOSPITAL_COMMUNITY): Payer: Self-pay

## 2020-11-09 NOTE — Progress Notes (Signed)
LDCT scheduled for 12/07/20 at 0900. Patient aware

## 2020-11-10 ENCOUNTER — Other Ambulatory Visit: Payer: Self-pay | Admitting: Nurse Practitioner

## 2020-11-10 DIAGNOSIS — I10 Essential (primary) hypertension: Secondary | ICD-10-CM

## 2020-11-13 DIAGNOSIS — M79642 Pain in left hand: Secondary | ICD-10-CM | POA: Insufficient documentation

## 2020-11-14 DIAGNOSIS — M79642 Pain in left hand: Secondary | ICD-10-CM | POA: Diagnosis not present

## 2020-11-14 DIAGNOSIS — M7552 Bursitis of left shoulder: Secondary | ICD-10-CM | POA: Insufficient documentation

## 2020-11-22 DIAGNOSIS — M5136 Other intervertebral disc degeneration, lumbar region: Secondary | ICD-10-CM | POA: Diagnosis not present

## 2020-12-07 ENCOUNTER — Ambulatory Visit (HOSPITAL_COMMUNITY)
Admission: RE | Admit: 2020-12-07 | Discharge: 2020-12-07 | Disposition: A | Discharge status: Home / Self Care | Payer: BC Managed Care – PPO | Source: Ambulatory Visit | Attending: Oncology | Admitting: Oncology

## 2020-12-07 DIAGNOSIS — Z87891 Personal history of nicotine dependence: Secondary | ICD-10-CM | POA: Diagnosis not present

## 2020-12-07 DIAGNOSIS — Z122 Encounter for screening for malignant neoplasm of respiratory organs: Secondary | ICD-10-CM | POA: Diagnosis not present

## 2020-12-07 DIAGNOSIS — F17210 Nicotine dependence, cigarettes, uncomplicated: Secondary | ICD-10-CM | POA: Diagnosis not present

## 2020-12-12 ENCOUNTER — Encounter (HOSPITAL_COMMUNITY): Payer: Self-pay

## 2020-12-12 NOTE — Progress Notes (Signed)
Patient notified of LDCT Lung Cancer Screening Results via mail with the recommendation to follow-up in 12 months. Patient's referring provider has been sent a copy of results. Results are as follows:   IMPRESSION: 1. Lung-RADS 1, negative. Continue annual screening with low-dose chest CT without contrast in 12 months. 2. Aortic Atherosclerosis (ICD10-I70.0) and Emphysema (ICD10-J43.9). 

## 2021-01-08 ENCOUNTER — Other Ambulatory Visit: Payer: Self-pay

## 2021-01-08 ENCOUNTER — Encounter: Payer: Self-pay | Admitting: Nurse Practitioner

## 2021-01-08 ENCOUNTER — Ambulatory Visit: Payer: BC Managed Care – PPO | Admitting: Nurse Practitioner

## 2021-01-08 VITALS — BP 142/78 | HR 70 | Temp 98.2°F | Resp 20 | Ht 68.0 in | Wt 192.0 lb

## 2021-01-08 DIAGNOSIS — R55 Syncope and collapse: Secondary | ICD-10-CM

## 2021-01-08 DIAGNOSIS — I25810 Atherosclerosis of coronary artery bypass graft(s) without angina pectoris: Secondary | ICD-10-CM | POA: Diagnosis not present

## 2021-01-08 DIAGNOSIS — J449 Chronic obstructive pulmonary disease, unspecified: Secondary | ICD-10-CM

## 2021-01-08 DIAGNOSIS — E785 Hyperlipidemia, unspecified: Secondary | ICD-10-CM | POA: Diagnosis not present

## 2021-01-08 DIAGNOSIS — F411 Generalized anxiety disorder: Secondary | ICD-10-CM

## 2021-01-08 DIAGNOSIS — R739 Hyperglycemia, unspecified: Secondary | ICD-10-CM | POA: Diagnosis not present

## 2021-01-08 DIAGNOSIS — I10 Essential (primary) hypertension: Secondary | ICD-10-CM

## 2021-01-08 DIAGNOSIS — L309 Dermatitis, unspecified: Secondary | ICD-10-CM

## 2021-01-08 DIAGNOSIS — R054 Cough syncope: Secondary | ICD-10-CM

## 2021-01-08 DIAGNOSIS — Z6829 Body mass index (BMI) 29.0-29.9, adult: Secondary | ICD-10-CM

## 2021-01-08 DIAGNOSIS — K219 Gastro-esophageal reflux disease without esophagitis: Secondary | ICD-10-CM

## 2021-01-08 LAB — BAYER DCA HB A1C WAIVED: HB A1C (BAYER DCA - WAIVED): 5.9 % (ref <7.0)

## 2021-01-08 MED ORDER — METOPROLOL SUCCINATE ER 25 MG PO TB24: 25.0000 mg | ORAL_TABLET | Freq: Every day | ORAL | 1 refills | Status: DC

## 2021-01-08 MED ORDER — LOSARTAN POTASSIUM 100 MG PO TABS: 1.0000 | ORAL_TABLET | Freq: Every day | ORAL | 1 refills | Status: DC

## 2021-01-08 MED ORDER — ESCITALOPRAM OXALATE 20 MG PO TABS: 20.0000 mg | ORAL_TABLET | Freq: Every day | ORAL | 1 refills | Status: DC

## 2021-01-08 MED ORDER — AMLODIPINE BESYLATE 10 MG PO TABS
10.0000 mg | ORAL_TABLET | Freq: Every day | ORAL | 1 refills | Status: DC
Start: 2021-01-08 — End: 2021-07-03

## 2021-01-08 MED ORDER — HYDROCHLOROTHIAZIDE 25 MG PO TABS: 25.0000 mg | ORAL_TABLET | Freq: Every day | ORAL | 1 refills | Status: DC

## 2021-01-08 MED ORDER — ALPRAZOLAM 0.5 MG PO TABS: 0.5000 mg | ORAL_TABLET | Freq: Two times a day (BID) | ORAL | 2 refills | Status: DC | PRN

## 2021-01-08 MED ORDER — FENOFIBRATE 160 MG PO TABS: 160.0000 mg | ORAL_TABLET | Freq: Every day | ORAL | 1 refills | Status: DC

## 2021-01-08 MED ORDER — TRIAMCINOLONE ACETONIDE 0.1 % EX CREA: 1.0000 "application " | TOPICAL_CREAM | Freq: Two times a day (BID) | CUTANEOUS | 1 refills | Status: AC

## 2021-01-08 MED ORDER — OMEPRAZOLE 40 MG PO CPDR: 40.0000 mg | DELAYED_RELEASE_CAPSULE | Freq: Two times a day (BID) | ORAL | 1 refills | Status: DC

## 2021-01-08 NOTE — Patient Instructions (Signed)
Textbook of family medicine (9th ed., pp. 1062-1073). [place], [place]: [employer].">  Stress, Adult Stress is a normal reaction to life events. Stress is what you feel when life demands more than you are used to, or more than you think you can handle. Some stress can be useful, such as studying for a test or meeting a deadline at work. Stress that occurs too often or for too long can cause problems. It can affect your emotional health and interfere with relationships and normal daily activities. Too much stress can weaken your body's defense system (immune system) and increase your risk for physical illness. If you already have a medical problem, stress can make it worse. What are the causes? All sorts of life events can cause stress. An event that causes stress for one person may not be stressful for another person. Major life events, whether positive or negative, commonly cause stress. Examples include:  Losing a job or starting a new job.  Losing a loved one.  Moving to a new town or home.  Getting married or divorced.  Having a baby.  Getting injured or sick. Less obvious life events can also cause stress, especially if they occur day after day or in combination with each other. Examples include:  Working long hours.  Driving in traffic.  Caring for children.  Being in debt.  Being in a difficult relationship. What are the signs or symptoms? Stress can cause emotional symptoms, including:  Anxiety. This is feeling worried, afraid, on edge, overwhelmed, or out of control.  Anger, including irritation or impatience.  Depression. This is feeling sad, down, helpless, or guilty.  Trouble focusing, remembering, or making decisions. Stress can cause physical symptoms, including:  Aches and pains. These may affect your head, neck, back, stomach, or other areas of your body.  Tight muscles or a clenched jaw.  Low energy.  Trouble sleeping. Stress can cause unhealthy  behaviors, including:  Eating to feel better (overeating) or skipping meals.  Working too much or putting off tasks.  Smoking, drinking alcohol, or using drugs to feel better. How is this diagnosed? Stress is diagnosed through an assessment by your health care provider. He or she may diagnose this condition based on:  Your symptoms and any stressful life events.  Your medical history.  Tests to rule out other causes of your symptoms. Depending on your condition, your health care provider may refer you to a specialist for further evaluation. How is this treated? Stress management techniques are the recommended treatment for stress. Medicine is not typically recommended for the treatment of stress. Techniques to reduce your reaction to stressful life events include:  Stress identification. Monitor yourself for symptoms of stress and identify what causes stress for you. These skills may help you to avoid or prepare for stressful events.  Time management. Set your priorities, keep a calendar of events, and learn to say no. Taking these actions can help you avoid making too many commitments. Techniques for coping with stress include:  Rethinking the problem. Try to think realistically about stressful events rather than ignoring them or overreacting. Try to find the positives in a stressful situation rather than focusing on the negatives.  Exercise. Physical exercise can release both physical and emotional tension. The key is to find a form of exercise that you enjoy and do it regularly.  Relaxation techniques. These relax the body and mind. The key is to find one or more that you enjoy and use the techniques regularly. Examples include: ?   Meditation, deep breathing, or progressive relaxation techniques. ? Yoga or tai chi. ? Biofeedback, mindfulness techniques, or journaling. ? Listening to music, being out in nature, or participating in other hobbies.  Practicing a healthy lifestyle.  Eat a balanced diet, drink plenty of water, limit or avoid caffeine, and get plenty of sleep.  Having a strong support network. Spend time with family, friends, or other people you enjoy being around. Express your feelings and talk things over with someone you trust. Counseling or talk therapy with a mental health professional may be helpful if you are having trouble managing stress on your own.   Follow these instructions at home: Lifestyle  Avoid drugs.  Do not use any products that contain nicotine or tobacco, such as cigarettes, e-cigarettes, and chewing tobacco. If you need help quitting, ask your health care provider.  Limit alcohol intake to no more than 1 drink a day for nonpregnant women and 2 drinks a day for men. One drink equals 12 oz of beer, 5 oz of wine, or 1 oz of hard liquor  Do not use alcohol or drugs to relax.  Eat a balanced diet that includes fresh fruits and vegetables, whole grains, lean meats, fish, eggs, and beans, and low-fat dairy. Avoid processed foods and foods high in added fat, sugar, and salt.  Exercise at least 30 minutes on 5 or more days each week.  Get 7-8 hours of sleep each night.   General instructions  Practice stress management techniques as discussed with your health care provider.  Drink enough fluid to keep your urine clear or pale yellow.  Take over-the-counter and prescription medicines only as told by your health care provider.  Keep all follow-up visits as told by your health care provider. This is important.   Contact a health care provider if:  Your symptoms get worse.  You have new symptoms.  You feel overwhelmed by your problems and can no longer manage them on your own. Get help right away if:  You have thoughts of hurting yourself or others. If you ever feel like you may hurt yourself or others, or have thoughts about taking your own life, get help right away. You can go to your nearest emergency department or  call:  Your local emergency services (911 in the [place]  A suicide crisis helpline, such as the National Suicide Prevention Lifeline at 1-800-273-8255. This is open 24 hours a day. Summary  Stress is a normal reaction to life events. It can cause problems if it happens too often or for too long.  Practicing stress management techniques is the best way to treat stress.  Counseling or talk therapy with a mental health professional may be helpful if you are having trouble managing stress on your own. This information is not intended to replace advice given to you by your health care provider. Make sure you discuss any questions you have with your health care provider. Document Revised: 05/19/2020 Document Reviewed: 05/19/2020 Elsevier Patient Education  2021 [employer]  

## 2021-01-08 NOTE — Progress Notes (Signed)
Subjective:    Patient ID: Eric Saunders, male    DOB: 12/05/1955, 65 y.o.   MRN: [id number]  Chief Complaint: medical management of chronic issues     HPI:  1. Essential hypertension No c/o chest pain, sob or headache. Does not check blood pressure at home. BP Readings from Last 3 Encounters:  11/08/20 (!) 146/82  07/10/20 (!) 180/89  05/09/20 126/61     2. Coronary artery disease involving autologous vein coronary bypass graft without angina pectoris Last saw cardiology on 11/08/20. According to office note no changes were made to plan of care. Only encouraged to stop smoking.  3. Chronic obstructive pulmonary disease, unspecified COPD type ([place]) Is currently on no inhalers. Still smokes over a pack a day.  4. Gastroesophageal reflux disease without esophagitis *on no prescription meds. symptoms really are affected by what he eats.  5. Hyperlipidemia with target LDL less than 100 Not watch diet and does very litt;e exercise. Changed lipitor to crestor. Lab Results  Component Value Date   CHOL 224 (H) 07/10/2020   HDL 54 07/10/2020   LDLCALC 139 (H) 07/10/2020   TRIG 171 (H) 07/10/2020   CHOLHDL 4.1 07/10/2020     6. Cough syncope syndrome Occurring at least 1x a week. Last episode was 2 weeks ago. Still not sure of cause.  7. GAD (generalized anxiety disorder) Is on xanax and lexapro and combinaton and he says it is not working as weell as it has been. Depression screen [facility] 2/9 01/08/2021 07/10/2020 01/20/2020  Decreased Interest 1 0 0  Down, Depressed, Hopeless 1 0 1  PHQ - 2 Score 2 0 1  Altered sleeping 3 - -  Tired, decreased energy 3 - -  Change in appetite 1 - -  Feeling bad or failure about yourself  1 - -  Trouble concentrating 1 - -  Moving slowly or fidgety/restless 0 - -  Suicidal thoughts 0 - -  PHQ-9 Score 11 - -  Difficult doing work/chores Not difficult at all - -   GAD 7 : Generalized Anxiety Score 01/08/2021 07/10/2020 10/07/2019  Nervous,  Anxious, on Edge 3 1 1   Control/stop worrying 3 1 1   Worry too much - different things 3 1 1   Trouble relaxing 3 1 0  Restless 3 0 0  Easily annoyed or irritable 3 1 2   Afraid - awful might happen 1 1 0  Total GAD 7 Score 19 6 5   Anxiety Difficulty Not difficult at all Not difficult at all Somewhat difficult     8. Obesity (BMI 30.0-34.9) Weight is down 13 lbs since last visit Wt Readings from Last 3 Encounters:  01/08/21 192 lb (87.1 kg)  11/08/20 205 lb (93 kg)  07/10/20 193 lb (87.5 kg)   BMI Readings from Last 3 Encounters:  01/08/21 29.19 kg/m  11/08/20 31.17 kg/m  07/10/20 29.35 kg/m       Outpatient Encounter Medications as of 01/08/2021  Medication Sig  . losartan (COZAAR) 100 MG tablet TAKE 1 TABLET BY MOUTH EVERY DAY  . ALPRAZolam (XANAX) 0.5 MG tablet Take 1 tablet (0.5 mg total) by mouth 2 (two) times daily as needed for anxiety.  Marland Kitchen amLODipine (NORVASC) 10 MG tablet Take 1 tablet (10 mg total) by mouth daily.  Marland Kitchen aspirin 81 MG tablet Take 81 mg by mouth daily.  . benzonatate (TESSALON PERLES) 100 MG capsule Take 1 capsule (100 mg total) by mouth 3 (three) times daily as needed for cough.  Marland Kitchen  cyclobenzaprine (FLEXERIL) 10 MG tablet Take 10 mg by mouth 2 (two) times daily as needed.  Marland Kitchen escitalopram (LEXAPRO) 10 MG tablet Take 1 tablet (10 mg total) by mouth daily.  . fenofibrate 160 MG tablet Take 1 tablet (160 mg total) by mouth daily.  . hydrochlorothiazide (HYDRODIURIL) 25 MG tablet Take 1 tablet (25 mg total) by mouth daily.  Marland Kitchen ibuprofen (ADVIL) 600 MG tablet Take 800 mg by mouth every 6 (six) hours as needed.  . metoprolol succinate (TOPROL-XL) 25 MG 24 hr tablet Take 1 tablet (25 mg total) by mouth daily.  Marland Kitchen omeprazole (PRILOSEC) 40 MG capsule Take 1 capsule (40 mg total) by mouth 2 (two) times daily.  . predniSONE (STERAPRED UNI-PAK 21 TAB) 10 MG (21) TBPK tablet Use as directed on back of pill pack  . rosuvastatin (CRESTOR) 20 MG tablet Take 1 tablet (20  mg total) by mouth daily.  Marland Kitchen triamcinolone (KENALOG) 0.1 % APPLY TO AFFECTED AREA TWICE A DAY   No facility-administered encounter medications on file as of 01/08/2021.    Past Surgical History:  Procedure Laterality Date  . COLONOSCOPY    . CORONARY ARTERY BYPASS GRAFT N/A 06/17/2014   Procedure: CORONARY ARTERY BYPASS GRAFTING (CABG) X 4, RIGHT LEG EVH;  Surgeon: Gaye Pollack, MD;  Location: [facility];  Service: Open Heart Surgery;  Laterality: N/A;  . KNEE SURGERY     bilateral arthroscopic  . LEFT HEART CATHETERIZATION WITH CORONARY ANGIOGRAM N/A 06/16/2014   Procedure: LEFT HEART CATHETERIZATION WITH CORONARY ANGIOGRAM;  Surgeon: Burnell Blanks, MD;  Location: [facility] CATH LAB;  Service: Cardiovascular;  Laterality: N/A;  . TEE WITHOUT CARDIOVERSION N/A 06/17/2014   Procedure: TRANSESOPHAGEAL ECHOCARDIOGRAM (TEE);  Surgeon: Gaye Pollack, MD;  Location: [place];  Service: Open Heart Surgery;  Laterality: N/A;  . UPPER GASTROINTESTINAL ENDOSCOPY      Family History  Problem Relation Age of Onset  . Drug abuse Sister   . Healthy Brother   . Diabetes Sister   . Healthy Sister   . Healthy Sister   . Healthy Sister   . Healthy Sister   . Healthy Brother   . Colon cancer Neg Hx   . Esophageal cancer Neg Hx   . Inflammatory bowel disease Neg Hx   . Liver disease Neg Hx   . Pancreatic cancer Neg Hx   . Rectal cancer Neg Hx   . Stomach cancer Neg Hx     New complaints: Hands are dri=y and cracked  Social history: Lives with his wife  Controlled substance contract: 07/10/20    Review of Systems  Constitutional: Negative for diaphoresis.  Eyes: Negative for pain.  Respiratory: Negative for shortness of breath.   Cardiovascular: Negative for chest pain, palpitations and leg swelling.  Gastrointestinal: Negative for abdominal pain.  Endocrine: Negative for polydipsia.  Skin: Negative for rash.  Neurological: Negative for dizziness, weakness and headaches.  Hematological:  Does not bruise/bleed easily.  All other systems reviewed and are negative.      Objective:   Physical Exam Vitals and nursing note reviewed.  Constitutional:      Appearance: Normal appearance. He is well-developed.  HENT:     Head: Normocephalic.     Nose: Nose normal.  Eyes:     Pupils: Pupils are equal, round, and reactive to light.  Neck:     Thyroid: No thyroid mass or thyromegaly.     Vascular: No carotid bruit or JVD.     Trachea:  Phonation normal.  Cardiovascular:     Rate and Rhythm: Normal rate and regular rhythm.  Pulmonary:     Effort: Pulmonary effort is normal. No respiratory distress.     Breath sounds: Normal breath sounds.  Abdominal:     General: Bowel sounds are normal.     Palpations: Abdomen is soft.     Tenderness: There is no abdominal tenderness.  Musculoskeletal:        General: Normal range of motion.     Cervical back: Normal range of motion and neck supple.  Lymphadenopathy:     Cervical: No cervical adenopathy.  Skin:    General: Skin is warm and dry.     Comments: Hands are dry and cracked open  Neurological:     Mental Status: He is alert and oriented to person, place, and time.  Psychiatric:        Behavior: Behavior normal.        Thought Content: Thought content normal.        Judgment: Judgment normal.    BP (!) 142/78   Pulse 70   Temp 98.2 F (36.8 C) (Temporal)   Resp 20   Ht $R'5\' 8"'pL$  (1.727 m)   Wt 192 lb (87.1 kg)   SpO2 96%   BMI 29.19 kg/m     HGBA1c 5.9%      Assessment & Plan:  ALEKSEI GOODLIN comes in today with chief complaint of Medical Management of Chronic Issues   Diagnosis and orders addressed:  1. Essential hypertension Low sodium diet - CBC with Differential/Platelet - CMP14+EGFR - losartan (COZAAR) 100 MG tablet; Take 1 tablet (100 mg total) by mouth daily.  Dispense: 90 tablet; Refill: 1 - hydrochlorothiazide (HYDRODIURIL) 25 MG tablet; Take 1 tablet (25 mg total) by mouth daily.  Dispense: 90  tablet; Refill: 1 - amLODipine (NORVASC) 10 MG tablet; Take 1 tablet (10 mg total) by mouth daily.  Dispense: 90 tablet; Refill: 1  2. Coronary artery disease involving autologous vein coronary bypass graft without angina pectoris Keep follow up with cardiology every 6 months - metoprolol succinate (TOPROL-XL) 25 MG 24 hr tablet; Take 1 tablet (25 mg total) by mouth daily.  Dispense: 90 tablet; Refill: 1  3. Chronic obstructive pulmonary disease, unspecified COPD type ([place]) Smoking cessation encouraged  4. Gastroesophageal reflux disease without esophagitis Avoid spicy foods Do not eat 2 hours prior to bedtime   5. Hyperlipidemia with target LDL less than 100 Low fat diet - Lipid panel - fenofibrate 160 MG tablet; Take 1 tablet (160 mg total) by mouth daily.  Dispense: 90 tablet; Refill: 1  6. Cough syncope syndrome contiue follow up with GI  7. GAD (generalized anxiety disorder) Stress management Increased lexapro from $RemoveBefo'10mg'udwatfFcFZV$   To $R'20mg'Gp$  - escitalopram (LEXAPRO) 20 MG tablet; Take 1 tablet (20 mg total) by mouth daily.  Dispense: 90 tablet; Refill: 1 - ALPRAZolam (XANAX) 0.5 MG tablet; Take 1 tablet (0.5 mg total) by mouth 2 (two) times daily as needed for anxiety.  Dispense: 60 tablet; Refill: 2  8. BMI 29.0-29.9,adult Discussed diet and exercise for person with BMI >25 Will recheck weight in 3-6 months  9. Elevated blood sugar - Bayer DCA Hb A1c Waived  10. Hand dermatitis Mix triamcinolone cream with eucerin cream BID - triamcinolone cream (KENALOG) 0.1 %; Apply 1 application topically 2 (two) times daily.  Dispense: 453.6 g; Refill: 1   Labs pending Health Maintenance reviewed Diet and exercise encouraged  Follow up plan:  6 months   [place], FNP

## 2021-01-09 LAB — CBC WITH DIFFERENTIAL/PLATELET
Basophils Absolute: 0 10*3/uL (ref 0.0–0.2)
Basos: 1 %
EOS (ABSOLUTE): 0.2 10*3/uL (ref 0.0–0.4)
Eos: 3 %
Hematocrit: 47.2 % (ref 37.5–51.0)
Hemoglobin: 15.6 g/dL (ref 13.0–17.7)
Immature Grans (Abs): 0 10*3/uL (ref 0.0–0.1)
Immature Granulocytes: 0 %
Lymphocytes Absolute: 2.1 10*3/uL (ref 0.7–3.1)
Lymphs: 34 %
MCH: 29.2 pg (ref 26.6–33.0)
MCHC: 33.1 g/dL (ref 31.5–35.7)
MCV: 88 fL (ref 79–97)
Monocytes Absolute: 0.5 10*3/uL (ref 0.1–0.9)
Monocytes: 9 %
Neutrophils Absolute: 3.3 10*3/uL (ref 1.4–7.0)
Neutrophils: 53 %
Platelets: 314 10*3/uL (ref 150–450)
RBC: 5.34 x10E6/uL (ref 4.14–5.80)
RDW: 14.5 % (ref 11.6–15.4)
WBC: 6.1 10*3/uL (ref 3.4–10.8)

## 2021-01-09 LAB — LIPID PANEL
Chol/HDL Ratio: 2.3 ratio (ref 0.0–5.0)
Cholesterol, Total: 119 mg/dL (ref 100–199)
HDL: 51 mg/dL (ref >39)
LDL Chol Calc (NIH): 52 mg/dL (ref 0–99)
Triglycerides: 78 mg/dL (ref 0–149)
VLDL Cholesterol Cal: 16 mg/dL (ref 5–40)

## 2021-01-09 LAB — CMP14+EGFR
ALT: 19 IU/L (ref 0–44)
AST: 14 IU/L (ref 0–40)
Albumin/Globulin Ratio: 1.8 (ref 1.2–2.2)
Albumin: 4.5 g/dL (ref 3.8–4.8)
Alkaline Phosphatase: 53 IU/L (ref 44–121)
BUN/Creatinine Ratio: 9 — ABNORMAL LOW (ref 10–24)
BUN: 12 mg/dL (ref 8–27)
Bilirubin Total: 0.5 mg/dL (ref 0.0–1.2)
CO2: 23 mmol/L (ref 20–29)
Calcium: 9.7 mg/dL (ref 8.6–10.2)
Chloride: 100 mmol/L (ref 96–106)
Creatinine, Ser: 1.33 mg/dL — ABNORMAL HIGH (ref 0.76–1.27)
Globulin, Total: 2.5 g/dL (ref 1.5–4.5)
Glucose: 93 mg/dL (ref 65–99)
Potassium: 4.4 mmol/L (ref 3.5–5.2)
Sodium: 139 mmol/L (ref 134–144)
Total Protein: 7 g/dL (ref 6.0–8.5)
eGFR: 60 mL/min/{1.73_m2} (ref >59)

## 2021-01-17 ENCOUNTER — Ambulatory Visit: Payer: BC Managed Care – PPO | Admitting: Gastroenterology

## 2021-01-17 ENCOUNTER — Encounter: Payer: Self-pay | Admitting: Gastroenterology

## 2021-01-17 VITALS — BP 128/60 | HR 76 | Ht 66.0 in | Wt 195.0 lb

## 2021-01-17 DIAGNOSIS — R053 Chronic cough: Secondary | ICD-10-CM

## 2021-01-17 DIAGNOSIS — F17200 Nicotine dependence, unspecified, uncomplicated: Secondary | ICD-10-CM | POA: Diagnosis not present

## 2021-01-17 DIAGNOSIS — K219 Gastro-esophageal reflux disease without esophagitis: Secondary | ICD-10-CM

## 2021-01-17 DIAGNOSIS — R054 Cough syncope: Secondary | ICD-10-CM

## 2021-01-17 DIAGNOSIS — R55 Syncope and collapse: Secondary | ICD-10-CM

## 2021-01-17 MED ORDER — OMEPRAZOLE 20 MG PO CPDR: 20.0000 mg | DELAYED_RELEASE_CAPSULE | Freq: Two times a day (BID) | ORAL | 3 refills | Status: DC

## 2021-01-17 NOTE — Progress Notes (Signed)
[place] VISIT   Primary Care Provider [name], [address] [place] [address] [phone]  Patient Profile: Eric Saunders is a 65 y.o. male with a pmh significant for CAD, hypertension, hyperlipidemia, tobacco use disorder, anxiety/panic disorder, recurrent idiopathic syncope (?cough syncope), GERD.  The patient presents to the [facility] for an evaluation and management of problem(s) noted below:  Problem List 1. Gastroesophageal reflux disease, unspecified whether esophagitis present   2. Chronic cough   3. Cough syncope   4. Tobacco use disorder     History of Present Illness Please see prior consultation and progress notes by PA Esterwood and myself for full details of HPI.   Interval History The patient returns for follow-up.  I have not seen him in quite a few months since his endoscopic evaluation from above and below.  His GERD symptoms remain well controlled on his current PPI dosing.  He does not feel that he is having any acid reflux issues.  His cough persists.  He continues to smoke.  He has not had a severe episode of cough syncope since Thanksgiving but every few weeks gets close to having a syncope.  He does not feel that there was any significant improvement after his endoscopic dilation however.  But dysphagia does not seem to be causing him significant issues at this time.  He has never seen a pulmonologist though he has had PFTs performed previously.  Patient denies any changes in his bowel habits.  GI Review of Systems Positive as above Negative for odynophagia, dysphagia, pain, nausea, vomiting, melena, hematochezia  Review of Systems General: Denies fevers/chills/weight loss unintentionally Cardiovascular: Denies chest pain/palpitations Pulmonary: Denies shortness of breath Gastroenterological: See HPI Genitourinary: Denies darkened urine Hematological: Denies easy  bruising/bleeding Dermatological: Denies jaundice Psychological: Mood is stable   Medications Current Outpatient Medications  Medication Sig Dispense Refill  . ALPRAZolam (XANAX) 0.5 MG tablet Take 1 tablet (0.5 mg total) by mouth 2 (two) times daily as needed for anxiety. 60 tablet 2  . amLODipine (NORVASC) 10 MG tablet Take 1 tablet (10 mg total) by mouth daily. 90 tablet 1  . aspirin 81 MG tablet Take 81 mg by mouth daily.    . cyclobenzaprine (FLEXERIL) 10 MG tablet Take 10 mg by mouth 2 (two) times daily as needed.    Marland Kitchen escitalopram (LEXAPRO) 20 MG tablet Take 1 tablet (20 mg total) by mouth daily. (Patient taking differently: Take 40 mg by mouth daily.) 90 tablet 1  . fenofibrate 160 MG tablet Take 1 tablet (160 mg total) by mouth daily. 90 tablet 1  . hydrochlorothiazide (HYDRODIURIL) 25 MG tablet Take 1 tablet (25 mg total) by mouth daily. 90 tablet 1  . ibuprofen (ADVIL) 600 MG tablet Take 800 mg by mouth every 6 (six) hours as needed.    Marland Kitchen losartan (COZAAR) 100 MG tablet Take 1 tablet (100 mg total) by mouth daily. 90 tablet 1  . metoprolol succinate (TOPROL-XL) 25 MG 24 hr tablet Take 1 tablet (25 mg total) by mouth daily. 90 tablet 1  . omeprazole (PRILOSEC) 20 MG capsule Take 1 capsule (20 mg total) by mouth 2 (two) times daily before lunch and supper. 90 capsule 3  . rosuvastatin (CRESTOR) 20 MG tablet Take 1 tablet (20 mg total) by mouth daily. 90 tablet 3  . triamcinolone cream (KENALOG) 0.1 % Apply 1 application topically 2 (two) times daily. 453.6 g 1   No current facility-administered medications for  this visit.    Allergies Allergies  Allergen Reactions  . Ace Inhibitors Cough    Histories Past Medical History:  Diagnosis Date  . Arthritis   . COPD (chronic obstructive pulmonary disease) ([place])   . Coronary artery disease   . Depression   . GERD (gastroesophageal reflux disease)   . Hyperlipemia   . Hypertension   . Panic disorder   . Tobacco abuse     Past Surgical History:  Procedure Laterality Date  . COLONOSCOPY    . CORONARY ARTERY BYPASS GRAFT N/A 06/17/2014   Procedure: CORONARY ARTERY BYPASS GRAFTING (CABG) X 4, RIGHT LEG EVH;  Surgeon: Gaye Pollack, MD;  Location: [facility];  Service: Open Heart Surgery;  Laterality: N/A;  . KNEE SURGERY     bilateral arthroscopic  . LEFT HEART CATHETERIZATION WITH CORONARY ANGIOGRAM N/A 06/16/2014   Procedure: LEFT HEART CATHETERIZATION WITH CORONARY ANGIOGRAM;  Surgeon: Burnell Blanks, MD;  Location: [facility] CATH LAB;  Service: Cardiovascular;  Laterality: N/A;  . TEE WITHOUT CARDIOVERSION N/A 06/17/2014   Procedure: TRANSESOPHAGEAL ECHOCARDIOGRAM (TEE);  Surgeon: Gaye Pollack, MD;  Location: [facility];  Service: Open Heart Surgery;  Laterality: N/A;  . UPPER GASTROINTESTINAL ENDOSCOPY     Social History   Socioeconomic History  . Marital status: Married    Spouse name: Not on file  . Number of children: 2  . Years of education: Not on file  . Highest education level: Not on file  Occupational History  . Occupation: applying for disability  Tobacco Use  . Smoking status: Current Every Day Smoker    Packs/day: 1.50    Years: 30.00    Pack years: 45.00    Types: Cigarettes    Start date: 27  . Smokeless tobacco: Never Used  Vaping Use  . Vaping Use: Never used  Substance and Sexual Activity  . Alcohol use: Yes    Alcohol/week: 6.0 standard drinks    Types: 6 Cans of beer per week    Comment: 6 weekly  . Drug use: No  . Sexual activity: Not on file  Other Topics Concern  . Not on file  Social History Narrative  . Not on file   Social Determinants of Health   Financial Resource Strain: Not on file  Food Insecurity: Not on file  Transportation Needs: Not on file  Physical Activity: Not on file  Stress: Not on file  Social Connections: Not on file  Intimate Partner Violence: Not on file   Family History  Problem Relation Age of Onset  . Drug abuse Sister   . Healthy  Brother   . Diabetes Sister   . Healthy Sister   . Healthy Sister   . Healthy Sister   . Healthy Sister   . Healthy Brother   . Colon cancer Neg Hx   . Esophageal cancer Neg Hx   . Inflammatory bowel disease Neg Hx   . Liver disease Neg Hx   . Pancreatic cancer Neg Hx   . Rectal cancer Neg Hx   . Stomach cancer Neg Hx    I have reviewed his medical, social, and family history in detail and updated the electronic medical record as necessary.    PHYSICAL EXAMINATION  BP 128/60 (BP Location: Left Arm, Patient Position: Sitting, Cuff Size: Normal)   Pulse 76   Ht 5\' 6"  (1.676 m) Comment: height measured without shoes  Wt 195 lb (88.5 kg)   BMI 31.47 kg/m  Wt Readings  from Last 3 Encounters:  01/17/21 195 lb (88.5 kg)  01/08/21 192 lb (87.1 kg)  11/08/20 205 lb (93 kg)  GEN: NAD, appears stated age, doesn't appear chronically ill PSYCH: Cooperative, without pressured speech EYE: Conjunctivae pink, sclerae anicteric ENT: Masked CV: Nontachycardic RESP: No audible wheezing GI: Soft, protuberant abdomen, nontender, without rebound or guarding  MSK/EXT: No lower extremity edema SKIN: No jaundice NEURO:  Alert & Oriented x 3, no focal deficits   REVIEW OF DATA  I reviewed the following data at the time of this encounter:  GI Procedures and Studies  August 2021 colonoscopy - Hemorrhoids found on digital rectal exam. - The examined portion of the ileum was normal. - Diverticulosis in the recto-sigmoid colon, in the sigmoid colon, in the descending colon, in the transverse colon and in the ascending colon (majority if left colon). - Normal mucosa in the entire examined colon otherwise. - Non-bleeding non-thrombosed internal hemorrhoids.  August 2021 EGD - No gross lesions in esophagus proximally. Biopsied. Salmon-colored mucosa island suspicious for Barrett's esophagus. Biopsied. - Z-line irregular, 40 cm from the incisors. - Dilation performed in the entire esophagus. -  Erythematous mucosa in the cardia, gastric body and antrum. Biopsied. - Erythematous duodenopathy. Biopsied.  Pathology Diagnosis 1. Surgical [P], duodenal - DUODENAL MUCOSA WITH HYPEREMIA. - NO FEATURES OF CELIAC SPRUE OR GRANULOMAS. 2. Surgical [P], random gastric - ANTRAL MUCOSA WITH FOCAL CHANGES OF REACTIVE GASTROPATHY. - OXYNTIC MUCOSA WITH HYPEREMIA. - WARTHIN-STARRY NEGATIVE FOR HELICOBACTER PYLORI. - NO INTESTINAL METAPLASIA, DYSPLASIA OR CARCINOMA. 3. Surgical [P], distal esophagus - GASTROESOPHAGEAL MUCOSA WITH MILD INFLAMMATION CONSISTENT WITH REFLUX. - NO INTESTINAL METAPLASIA, DYSPLASIA OR CARCINOMA. 4. Surgical [P], random esophagus - UNREMARKABLE SQUAMOUS MUCOSA. - NO EOSINOPHILIC ESOPHAGITIS (LESS THAN 2 PER HIGH POWER FIELD).  Laboratory Studies  Reviewed those in epic  Imaging Studies  No new imaging studies to review   ASSESSMENT  Mr. Goh is a 65 y.o. male with a pmh significant for CAD, hypertension, hyperlipidemia, tobacco use disorder, anxiety/panic disorder, recurrent idiopathic syncope (?cough syncope), GERD.  The patient is seen today for evaluation and management of:  1. Gastroesophageal reflux disease, unspecified whether esophagitis present   2. Chronic cough   3. Cough syncope   4. Tobacco use disorder    The patient remains hemodynamically stable as well as clinically doing well from a GERD perspective.  His previous GERD symptoms are well controlled.  He does take his morning PPI dose appropriately though in the evening he takes his second PPI dose closer to bedtime rather than before dinner.  We have discussed trying to optimize the PPI use in the afternoon as well.  He has never seen a pulmonologist for his chronic cough and we discussed potential referral and he agrees with this and we will move forward with getting that scheduled.  I do not think that pH impedance testing is going to help me further at this time.  If after pulmonary  evaluation patient is felt to have indication to further evaluate symptoms we can consider pH impedance testing.  If the patient develops significant recurrence of dysphagia we will consider manometry.  Otherwise at this point in time I will see the patient on an as-needed basis once yearly and we will give him 12 months worth of refills.  All patient questions were answered to the best of my ability, and the patient agrees to the aforementioned plan of action with follow-up as indicated.   PLAN  Continue omeprazole 20  mg twice daily (take before meals) - 1 year worth of refills Colonoscopy for screening in 2031 Pulmonary referral for chronic cough and cough syncope  Follow-up in 1 year or earlier if needed   Orders Placed This Encounter  Procedures  . Ambulatory referral to Pulmonology    New Prescriptions   OMEPRAZOLE (PRILOSEC) 20 MG CAPSULE    Take 1 capsule (20 mg total) by mouth 2 (two) times daily before lunch and supper.   Modified Medications   No medications on file    Planned Follow Up Return in about 1 year (around 01/17/2022).   Total Time in Face-to-Face and in Coordination of Care for patient including independent/personal interpretation/review of prior testing, medical history, examination, medication adjustment, communicating results with the patient directly, and documentation with the EHR is 25 minutes.   [name], MD [facility] Office # [phone]

## 2021-01-17 NOTE — Patient Instructions (Addendum)
Take your Omeprazole 20mg  twice daily 30 mins before breakfast and dinner.   You have been referred to Pulmonary -Dr.Olalere for chronic cough. IF you have not heard from their office in 1-2 weeks, please contact office and let us know.   If you are age 63 or older, your body mass index should be between 23-30. Your Body mass index is 31.47 kg/m. If this is out of the aforementioned range listed, please consider follow up with your Primary Care Provider.  If you are age 28 or younger, your body mass index should be between 19-25. Your Body mass index is 31.47 kg/m. If this is out of the aformentioned range listed, please consider follow up with your Primary Care Provider.    We have sent the following medications to your pharmacy for you to pick up at your convenience: Omeprazole    Thank you for choosing me and [facility].  Dr. Rush Landmark

## 2021-01-19 ENCOUNTER — Encounter: Payer: Self-pay | Admitting: Gastroenterology

## 2021-01-19 DIAGNOSIS — R059 Cough, unspecified: Secondary | ICD-10-CM | POA: Insufficient documentation

## 2021-01-19 DIAGNOSIS — R053 Chronic cough: Secondary | ICD-10-CM | POA: Insufficient documentation

## 2021-01-31 ENCOUNTER — Telehealth: Payer: Self-pay

## 2021-01-31 MED ORDER — BENZONATATE 100 MG PO CAPS: 100.0000 mg | ORAL_CAPSULE | Freq: Three times a day (TID) | ORAL | 1 refills | Status: DC | PRN

## 2021-01-31 NOTE — Telephone Encounter (Signed)
Prescription perles prescription sent to pharmacy

## 2021-01-31 NOTE — Telephone Encounter (Signed)
Pt called stating that he needs MMM to send him in a refill of his cough medicine that he was prescribed last year. Tried to make pt an appt for cough but pt refused. Said MMM knows his situation and doesn't need an appt.  Please advise and call patient.

## 2021-01-31 NOTE — Telephone Encounter (Signed)
Patient's wife notified and verbalized understanding.  

## 2021-02-07 ENCOUNTER — Other Ambulatory Visit: Payer: Self-pay | Admitting: Nurse Practitioner

## 2021-02-07 DIAGNOSIS — F410 Panic disorder [episodic paroxysmal anxiety] without agoraphobia: Secondary | ICD-10-CM

## 2021-02-19 ENCOUNTER — Other Ambulatory Visit: Payer: Self-pay | Admitting: Nurse Practitioner

## 2021-02-19 DIAGNOSIS — F411 Generalized anxiety disorder: Secondary | ICD-10-CM

## 2021-02-19 MED ORDER — ESCITALOPRAM OXALATE 20 MG PO TABS
40.0000 mg | ORAL_TABLET | Freq: Every day | ORAL | 1 refills | Status: DC
Start: 2021-02-19 — End: 2021-07-18

## 2021-02-21 ENCOUNTER — Other Ambulatory Visit: Payer: Self-pay

## 2021-02-21 ENCOUNTER — Encounter: Payer: Self-pay | Admitting: Pulmonary Disease

## 2021-02-21 ENCOUNTER — Ambulatory Visit: Payer: BC Managed Care – PPO | Admitting: Pulmonary Disease

## 2021-02-21 VITALS — BP 128/70 | HR 75 | Temp 98.1°F | Ht 67.5 in | Wt 189.8 lb

## 2021-02-21 DIAGNOSIS — R053 Chronic cough: Secondary | ICD-10-CM | POA: Diagnosis not present

## 2021-02-21 DIAGNOSIS — R059 Cough, unspecified: Secondary | ICD-10-CM | POA: Diagnosis not present

## 2021-02-21 MED ORDER — BUPROPION HCL ER (SMOKING DET) 150 MG PO TB12: 150.0000 mg | ORAL_TABLET | Freq: Two times a day (BID) | ORAL | 3 refills | Status: DC

## 2021-02-21 NOTE — Patient Instructions (Signed)
History of cough syncope  Goal is to try and decrease the episodes of a cough and stay safe  Quitting smoking will help a great deal  Wellbutrin will be called in for you  You may use over-the-counter Delsym-may help reduce coughing spells as well  Call with significant concerns  Tentative follow-up in 3 months

## 2021-02-21 NOTE — Progress Notes (Signed)
Eric Saunders    [id number]    Nov 21, 1955  Primary Care Physician:Martin, Mary-Margaret, FNP  Referring Physician: Irving Copas., MD [place],  [place] [address]  Chief complaint:   Cough syncope  HPI:  History of cough syncope for many years Over the last 10 years has had multiple procedures Followed up with cardiology, his primary doctor, GI  Sometimes does not have any warning before the episode, will just feel like something in his throat and will stop coughing and sometimes passes out sometimes just about to pass out  He is an active smoker Currently smokes about a pack a day  Was able to quit in the past, quit for 10 years previously  History of hypertension, coronary artery disease  History of COPD, stated he has tried multiple inhalers in the past and they all seem to make his symptoms worse, increase in his cough Did not notice any improvement with any inhalers they Nyra Capes in the past  Works as a Insurance claims handler  Have a history of anxiety and depression  Was recently prescribed Chantix, did not want to take it because of the side effect profile  Outpatient Encounter Medications as of 02/21/2021  Medication Sig  . ALPRAZolam (XANAX) 0.5 MG tablet Take 1 tablet (0.5 mg total) by mouth 2 (two) times daily as needed for anxiety.  Marland Kitchen amLODipine (NORVASC) 10 MG tablet Take 1 tablet (10 mg total) by mouth daily.  Marland Kitchen aspirin 81 MG tablet Take 81 mg by mouth daily.  . benzonatate (TESSALON PERLES) 100 MG capsule Take 1 capsule (100 mg total) by mouth 3 (three) times daily as needed.  . cyclobenzaprine (FLEXERIL) 10 MG tablet Take 10 mg by mouth 2 (two) times daily as needed.  Marland Kitchen escitalopram (LEXAPRO) 20 MG tablet Take 2 tablets (40 mg total) by mouth daily.  . fenofibrate 160 MG tablet Take 1 tablet (160 mg total) by mouth daily.  . hydrochlorothiazide (HYDRODIURIL) 25 MG tablet Take 1 tablet (25 mg total) by mouth daily.  Marland Kitchen ibuprofen  (ADVIL) 600 MG tablet Take 800 mg by mouth every 6 (six) hours as needed.  Marland Kitchen losartan (COZAAR) 100 MG tablet Take 1 tablet (100 mg total) by mouth daily.  . metoprolol succinate (TOPROL-XL) 25 MG 24 hr tablet Take 1 tablet (25 mg total) by mouth daily.  Marland Kitchen omeprazole (PRILOSEC) 20 MG capsule Take 1 capsule (20 mg total) by mouth 2 (two) times daily before lunch and supper.  . rosuvastatin (CRESTOR) 20 MG tablet Take 1 tablet (20 mg total) by mouth daily.  Marland Kitchen triamcinolone cream (KENALOG) 0.1 % Apply 1 application topically 2 (two) times daily.   No facility-administered encounter medications on file as of 02/21/2021.    Allergies as of 02/21/2021 - Review Complete 01/19/2021  Allergen Reaction Noted  . Ace inhibitors Cough 01/02/2017    Past Medical History:  Diagnosis Date  . Arthritis   . COPD (chronic obstructive pulmonary disease) ([place])   . Coronary artery disease   . Depression   . GERD (gastroesophageal reflux disease)   . Hyperlipemia   . Hypertension   . Panic disorder   . Tobacco abuse     Past Surgical History:  Procedure Laterality Date  . COLONOSCOPY    . CORONARY ARTERY BYPASS GRAFT N/A 06/17/2014   Procedure: CORONARY ARTERY BYPASS GRAFTING (CABG) X 4, RIGHT LEG EVH;  Surgeon: Gaye Pollack, MD;  Location: [facility];  Service: Open Heart Surgery;  Laterality: N/A;  . KNEE SURGERY     bilateral arthroscopic  . LEFT HEART CATHETERIZATION WITH CORONARY ANGIOGRAM N/A 06/16/2014   Procedure: LEFT HEART CATHETERIZATION WITH CORONARY ANGIOGRAM;  Surgeon: Burnell Blanks, MD;  Location: [facility] CATH LAB;  Service: Cardiovascular;  Laterality: N/A;  . TEE WITHOUT CARDIOVERSION N/A 06/17/2014   Procedure: TRANSESOPHAGEAL ECHOCARDIOGRAM (TEE);  Surgeon: Gaye Pollack, MD;  Location: [facility];  Service: Open Heart Surgery;  Laterality: N/A;  . UPPER GASTROINTESTINAL ENDOSCOPY      Family History  Problem Relation Age of Onset  . Drug abuse Sister   . Healthy Brother   . Diabetes  Sister   . Healthy Sister   . Healthy Sister   . Healthy Sister   . Healthy Sister   . Healthy Brother   . Colon cancer Neg Hx   . Esophageal cancer Neg Hx   . Inflammatory bowel disease Neg Hx   . Liver disease Neg Hx   . Pancreatic cancer Neg Hx   . Rectal cancer Neg Hx   . Stomach cancer Neg Hx     Social History   Socioeconomic History  . Marital status: Married    Spouse name: Not on file  . Number of children: 2  . Years of education: Not on file  . Highest education level: Not on file  Occupational History  . Occupation: applying for disability  Tobacco Use  . Smoking status: Current Every Day Smoker    Packs/day: 1.50    Years: 30.00    Pack years: 45.00    Types: Cigarettes    Start date: 71  . Smokeless tobacco: Never Used  Vaping Use  . Vaping Use: Never used  Substance and Sexual Activity  . Alcohol use: Yes    Alcohol/week: 6.0 standard drinks    Types: 6 Cans of beer per week    Comment: 6 weekly  . Drug use: No  . Sexual activity: Not on file  Other Topics Concern  . Not on file  Social History Narrative  . Not on file   Social Determinants of Health   Financial Resource Strain: Not on file  Food Insecurity: Not on file  Transportation Needs: Not on file  Physical Activity: Not on file  Stress: Not on file  Social Connections: Not on file  Intimate Partner Violence: Not on file    Review of Systems  Constitutional: Negative for fever.  Respiratory: Positive for cough. Negative for shortness of breath.     There were no vitals filed for this visit.   Physical Exam Constitutional:      Appearance: He is obese.  HENT:     Head: Normocephalic.     Mouth/Throat:     Mouth: Mucous membranes are moist.  Eyes:     General:        Right eye: No discharge.        Left eye: No discharge.     Pupils: Pupils are equal, round, and reactive to light.  Cardiovascular:     Rate and Rhythm: Normal rate and regular rhythm.     Heart  sounds: No murmur heard.   Pulmonary:     Effort: Pulmonary effort is normal. No respiratory distress.     Breath sounds: Normal breath sounds. No stridor. No wheezing or rhonchi.  Musculoskeletal:     Cervical back: No rigidity or tenderness.  Neurological:     Mental Status: He is  alert.  Psychiatric:        Mood and Affect: Mood normal.      Data Reviewed: Spirometry from 10/07/2019 reveals severe obstructive disease Decreased from a pulmonary function test that he had in 2015  Recent CT lung cancer screening 12/07/2020-no abnormal nodules noted  Assessment:  Cough syncope  Active smoker  History of coronary artery disease  Chronic obstructive pulmonary disease  History of coronary artery disease/hypertension  Plan/Recommendations: Discussed importance of smoking cessation which may help decrease the cough Decrease exacerbations that may be promoting the cough  The goal with treatment of cough syncope is to try and modify triggers and safety  He is agreeable to try Wellbutrin, does not feel comfortable using Zyban -Interaction between Wellbutrin and metoprolol was discussed with the patient -I did encourage him to contact cardiology to see whether he is metoprolol dose can be decreased-close monitoring  Delsym may be used to help with the cough and  The likelihood of progression of his disease with continuing to smoke was discussed  He feels inhalers have made his symptoms worse in the past and not wanting to try any inhalers at present  I will see him back in about 3 months  Sherrilyn Rist MD [facility] 02/21/2021, 8:52 AM  CC: Mansouraty, Telford Nab.*

## 2021-04-10 DIAGNOSIS — M51369 Other intervertebral disc degeneration, lumbar region without mention of lumbar back pain or lower extremity pain: Secondary | ICD-10-CM | POA: Insufficient documentation

## 2021-04-10 DIAGNOSIS — M5136 Other intervertebral disc degeneration, lumbar region: Secondary | ICD-10-CM | POA: Insufficient documentation

## 2021-06-17 ENCOUNTER — Other Ambulatory Visit: Payer: Self-pay | Admitting: Pulmonary Disease

## 2021-07-03 ENCOUNTER — Other Ambulatory Visit: Payer: Self-pay | Admitting: Nurse Practitioner

## 2021-07-03 DIAGNOSIS — I10 Essential (primary) hypertension: Secondary | ICD-10-CM

## 2021-07-10 ENCOUNTER — Ambulatory Visit: Payer: Self-pay | Admitting: Nurse Practitioner

## 2021-07-10 ENCOUNTER — Encounter: Payer: Self-pay | Admitting: Nurse Practitioner

## 2021-07-17 ENCOUNTER — Other Ambulatory Visit: Payer: Self-pay | Admitting: Nurse Practitioner

## 2021-07-17 DIAGNOSIS — I25810 Atherosclerosis of coronary artery bypass graft(s) without angina pectoris: Secondary | ICD-10-CM

## 2021-07-18 ENCOUNTER — Encounter: Payer: Self-pay | Admitting: Nurse Practitioner

## 2021-07-18 ENCOUNTER — Other Ambulatory Visit: Payer: Self-pay

## 2021-07-18 ENCOUNTER — Other Ambulatory Visit: Payer: Self-pay | Admitting: Nurse Practitioner

## 2021-07-18 ENCOUNTER — Ambulatory Visit (INDEPENDENT_AMBULATORY_CARE_PROVIDER_SITE_OTHER): Payer: Medicare Other | Admitting: Nurse Practitioner

## 2021-07-18 VITALS — BP 150/80 | HR 58 | Temp 98.0°F | Resp 20 | Ht 67.0 in | Wt 189.0 lb

## 2021-07-18 DIAGNOSIS — I25810 Atherosclerosis of coronary artery bypass graft(s) without angina pectoris: Secondary | ICD-10-CM

## 2021-07-18 DIAGNOSIS — K219 Gastro-esophageal reflux disease without esophagitis: Secondary | ICD-10-CM | POA: Diagnosis not present

## 2021-07-18 DIAGNOSIS — S3994XA Unspecified injury of external genitals, initial encounter: Secondary | ICD-10-CM

## 2021-07-18 DIAGNOSIS — E669 Obesity, unspecified: Secondary | ICD-10-CM

## 2021-07-18 DIAGNOSIS — F411 Generalized anxiety disorder: Secondary | ICD-10-CM

## 2021-07-18 DIAGNOSIS — E785 Hyperlipidemia, unspecified: Secondary | ICD-10-CM

## 2021-07-18 DIAGNOSIS — I10 Essential (primary) hypertension: Secondary | ICD-10-CM

## 2021-07-18 DIAGNOSIS — Z6829 Body mass index (BMI) 29.0-29.9, adult: Secondary | ICD-10-CM

## 2021-07-18 DIAGNOSIS — J449 Chronic obstructive pulmonary disease, unspecified: Secondary | ICD-10-CM

## 2021-07-18 DIAGNOSIS — N433 Hydrocele, unspecified: Secondary | ICD-10-CM

## 2021-07-18 DIAGNOSIS — K5900 Constipation, unspecified: Secondary | ICD-10-CM

## 2021-07-18 DIAGNOSIS — M109 Gout, unspecified: Secondary | ICD-10-CM

## 2021-07-18 DIAGNOSIS — F17200 Nicotine dependence, unspecified, uncomplicated: Secondary | ICD-10-CM

## 2021-07-18 MED ORDER — AMLODIPINE BESYLATE 10 MG PO TABS
10.0000 mg | ORAL_TABLET | Freq: Every day | ORAL | 1 refills | Status: DC
Start: 2021-07-18 — End: 2021-12-17

## 2021-07-18 MED ORDER — HYDROCHLOROTHIAZIDE 25 MG PO TABS
25.0000 mg | ORAL_TABLET | Freq: Every day | ORAL | 1 refills | Status: DC
Start: 2021-07-18 — End: 2021-12-17

## 2021-07-18 MED ORDER — ROSUVASTATIN CALCIUM 20 MG PO TABS: 20.0000 mg | ORAL_TABLET | Freq: Every day | ORAL | 1 refills | Status: DC

## 2021-07-18 MED ORDER — CLONIDINE HCL 0.1 MG PO TABS: 0.1000 mg | ORAL_TABLET | Freq: Every day | ORAL | 1 refills | Status: DC

## 2021-07-18 MED ORDER — ESCITALOPRAM OXALATE 20 MG PO TABS: 40.0000 mg | ORAL_TABLET | Freq: Every day | ORAL | 1 refills | Status: DC

## 2021-07-18 MED ORDER — ALPRAZOLAM 0.5 MG PO TABS: 0.5000 mg | ORAL_TABLET | Freq: Two times a day (BID) | ORAL | 2 refills | Status: DC | PRN

## 2021-07-18 MED ORDER — LOSARTAN POTASSIUM 100 MG PO TABS: 100.0000 mg | ORAL_TABLET | Freq: Every day | ORAL | 1 refills | Status: DC

## 2021-07-18 MED ORDER — OMEPRAZOLE 20 MG PO CPDR: 20.0000 mg | DELAYED_RELEASE_CAPSULE | Freq: Two times a day (BID) | ORAL | 1 refills | Status: DC

## 2021-07-18 MED ORDER — FENOFIBRATE 160 MG PO TABS
160.0000 mg | ORAL_TABLET | Freq: Every day | ORAL | 1 refills | Status: DC
Start: 2021-07-18 — End: 2021-12-17

## 2021-07-18 MED ORDER — METOPROLOL SUCCINATE ER 25 MG PO TB24: 25.0000 mg | ORAL_TABLET | Freq: Every day | ORAL | 1 refills | Status: DC
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
## 2021-07-18 NOTE — Patient Instructions (Signed)
Managing the Challenge of Quitting Smoking Quitting smoking is a physical and mental challenge. You will face cravings, withdrawal symptoms, and temptation. Before quitting, work with your health care provider to make a plan that can help you manage quitting. Preparation canhelp you quit and keep you from giving in. How to manage lifestyle changes Managing stress Stress can make you want to smoke, and wanting to smoke may cause stress. It is important to find ways to manage your stress. You might try some of the following: Practice relaxation techniques. Breathe slowly and deeply, in through your nose and out through your mouth. Listen to music. Soak in a bath or take a shower. Imagine a peaceful place or vacation. Get some support. Talk with family or friends about your stress. Join a support group. Talk with a counselor or therapist. Get some physical activity. Go for a walk, run, or bike ride. Play a favorite sport. Practice yoga.  Medicines Talk with your health care provider about medicines that might help you dealwith cravings and make quitting easier for you. Relationships Social situations can be difficult when you are quitting smoking. To manage this, you can: Avoid parties and other social situations where people might be smoking. Avoid alcohol. Leave right away if you have the urge to smoke. Explain to your family and friends that you are quitting smoking. Ask for support and let them know you might be a bit grumpy. Plan activities where smoking is not an option. General instructions Be aware that many people gain weight after they quit smoking. However, not everyone does. To keep from gaining weight, have a plan in place before you quit and stick to the plan after you quit. Your plan should include: Having healthy snacks. When you have a craving, it may help to: Eat popcorn, carrots, celery, or other cut vegetables. Chew sugar-free gum. Changing how you eat. Eat small  portion sizes at meals. Eat 4-6 small meals throughout the day instead of 1-2 large meals a day. Be mindful when you eat. Do not watch television or do other things that might distract you as you eat. Exercising regularly. Make time to exercise each day. If you do not have time for a long workout, do short bouts of exercise for 5-10 minutes several times a day. Do some form of strengthening exercise, such as weight lifting. Do some exercise that gets your heart beating and causes you to breathe deeply, such as walking fast, running, swimming, or biking. This is very important. Drinking plenty of water or other low-calorie or no-calorie drinks. Drink 6-8 glasses of water daily.  How to recognize withdrawal symptoms Your body and mind may experience discomfort as you try to get used to not having nicotine in your system. These effects are called withdrawal symptoms. They may include: Feeling hungrier than normal. Having trouble concentrating. Feeling irritable or restless. Having trouble sleeping. Feeling depressed. Craving a cigarette. To manage withdrawal symptoms: Avoid places, people, and activities that trigger your cravings. Remember why you want to quit. Get plenty of sleep. Avoid coffee and other caffeinated drinks. These may worsen some of your symptoms. These symptoms may surprise you. But be assured that they are normal to havewhen quitting smoking. How to manage cravings Come up with a plan for how to deal with your cravings. The plan should include the following: A definition of the specific situation you want to deal with. An alternative action you will take. A clear idea for how this action will help. The   name of someone who might help you with this. Cravings usually last for 5-10 minutes. Consider taking the following actions to help you with your plan to deal with cravings: Keep your mouth busy. Chew sugar-free gum. Suck on hard candies or a straw. Brush your  teeth. Keep your hands and body busy. Change to a different activity right away. Squeeze or play with a ball. Do an activity or a hobby, such as making bead jewelry, practicing needlepoint, or working with wood. Mix up your normal routine. Take a short exercise break. Go for a quick walk or run up and down stairs. Focus on doing something kind or helpful for someone else. Call a friend or family member to talk during a craving. Join a support group. Contact a quitline. Where to find support To get help or find a support group: Call the National Cancer Institute's Smoking Quitline: 1-800-QUIT NOW (784-8669) Visit the website of the Substance Abuse and Mental Health Services Administration: www.samhsa.gov Text QUIT to SmokefreeTXT: 478848 Where to find more information Visit these websites to find more information on quitting smoking: National Cancer Institute: www.smokefree.gov American Lung Association: www.lung.org American Cancer Society: www.cancer.org Centers for Disease Control and Prevention: www.cdc.gov American Heart Association: www.heart.org Contact a health care provider if: You want to change your plan for quitting. The medicines you are taking are not helping. Your eating feels out of control or you cannot sleep. Get help right away if: You feel depressed or become very anxious. Summary Quitting smoking is a physical and mental challenge. You will face cravings, withdrawal symptoms, and temptation to smoke again. Preparation can help you as you go through these challenges. Try different techniques to manage stress, handle social situations, and prevent weight gain. You can deal with cravings by keeping your mouth busy (such as by chewing gum), keeping your hands and body busy, calling family or friends, or contacting a quitline for people who want to quit smoking. You can deal with withdrawal symptoms by avoiding places where people smoke, getting plenty of rest, and  avoiding drinks with caffeine. This information is not intended to replace advice given to you by your health care provider. Make sure you discuss any questions you have with your healthcare provider. Document Revised: 06/22/2019 Document Reviewed: 06/22/2019 Elsevier Patient Education  2022 Elsevier Inc.  

## 2021-07-19 ENCOUNTER — Ambulatory Visit (HOSPITAL_COMMUNITY)
Admission: RE | Admit: 2021-07-19 | Discharge: 2021-07-19 | Disposition: A | Discharge status: Home / Self Care | Payer: BC Managed Care – PPO | Source: Ambulatory Visit | Attending: Nurse Practitioner | Admitting: Nurse Practitioner

## 2021-07-19 DIAGNOSIS — S3994XA Unspecified injury of external genitals, initial encounter: Secondary | ICD-10-CM | POA: Insufficient documentation

## 2021-07-19 NOTE — Addendum Note (Signed)
Addended by: [name] on: 07/19/2021 03:11 PM   Modules accepted: Orders

## 2021-07-20 LAB — LIPID PANEL
Chol/HDL Ratio: 3.5 ratio (ref 0.0–5.0)
Cholesterol, Total: 184 mg/dL (ref 100–199)
HDL: 52 mg/dL (ref >39)
LDL Chol Calc (NIH): 106 mg/dL — ABNORMAL HIGH (ref 0–99)
Triglycerides: 148 mg/dL (ref 0–149)
VLDL Cholesterol Cal: 26 mg/dL (ref 5–40)

## 2021-07-20 LAB — CBC WITH DIFFERENTIAL/PLATELET
Basophils Absolute: 0 10*3/uL (ref 0.0–0.2)
Basos: 1 %
EOS (ABSOLUTE): 0.1 10*3/uL (ref 0.0–0.4)
Eos: 1 %
Hematocrit: 47.1 % (ref 37.5–51.0)
Hemoglobin: 16.1 g/dL (ref 13.0–17.7)
Immature Grans (Abs): 0 10*3/uL (ref 0.0–0.1)
Immature Granulocytes: 0 %
Lymphocytes Absolute: 2.6 10*3/uL (ref 0.7–3.1)
Lymphs: 34 %
MCH: 30.3 pg (ref 26.6–33.0)
MCHC: 34.2 g/dL (ref 31.5–35.7)
MCV: 89 fL (ref 79–97)
Monocytes Absolute: 0.5 10*3/uL (ref 0.1–0.9)
Monocytes: 6 %
Neutrophils Absolute: 4.5 10*3/uL (ref 1.4–7.0)
Neutrophils: 58 %
Platelets: 350 10*3/uL (ref 150–450)
RBC: 5.32 x10E6/uL (ref 4.14–5.80)
RDW: 14.7 % (ref 11.6–15.4)
WBC: 7.8 10*3/uL (ref 3.4–10.8)

## 2021-07-20 LAB — CMP14+EGFR
ALT: 12 IU/L (ref 0–44)
AST: 18 IU/L (ref 0–40)
Albumin/Globulin Ratio: 2.2 (ref 1.2–2.2)
Albumin: 5.2 g/dL — ABNORMAL HIGH (ref 3.8–4.8)
Alkaline Phosphatase: 50 IU/L (ref 44–121)
BUN/Creatinine Ratio: 21 (ref 10–24)
BUN: 24 mg/dL (ref 8–27)
Bilirubin Total: 0.4 mg/dL (ref 0.0–1.2)
CO2: 23 mmol/L (ref 20–29)
Calcium: 10.3 mg/dL — ABNORMAL HIGH (ref 8.6–10.2)
Chloride: 99 mmol/L (ref 96–106)
Creatinine, Ser: 1.15 mg/dL (ref 0.76–1.27)
Globulin, Total: 2.4 g/dL (ref 1.5–4.5)
Glucose: 85 mg/dL (ref 70–99)
Potassium: 4.8 mmol/L (ref 3.5–5.2)
Sodium: 140 mmol/L (ref 134–144)
Total Protein: 7.6 g/dL (ref 6.0–8.5)
eGFR: 72 mL/min/{1.73_m2} (ref >59)

## 2021-07-24 ENCOUNTER — Other Ambulatory Visit: Payer: Self-pay | Admitting: Nurse Practitioner

## 2021-07-24 DIAGNOSIS — F411 Generalized anxiety disorder: Secondary | ICD-10-CM

## 2021-07-25 ENCOUNTER — Ambulatory Visit: Payer: BC Managed Care – PPO | Admitting: Urology

## 2021-07-25 ENCOUNTER — Other Ambulatory Visit: Payer: Self-pay

## 2021-07-25 ENCOUNTER — Encounter: Payer: Self-pay | Admitting: Urology

## 2021-07-25 VITALS — BP 125/74 | HR 70

## 2021-07-25 DIAGNOSIS — N433 Hydrocele, unspecified: Secondary | ICD-10-CM | POA: Diagnosis not present

## 2021-07-25 LAB — URINALYSIS, ROUTINE W REFLEX MICROSCOPIC
Bilirubin, UA: NEGATIVE
Glucose, UA: NEGATIVE
Ketones, UA: NEGATIVE
Leukocytes,UA: NEGATIVE
Nitrite, UA: NEGATIVE
Protein,UA: NEGATIVE
RBC, UA: NEGATIVE
Specific Gravity, UA: 1.025 (ref 1.005–1.030)
Urobilinogen, Ur: 0.2 mg/dL (ref 0.2–1.0)
pH, UA: 5.5 (ref 5.0–7.5)
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
## 2021-07-25 NOTE — Progress Notes (Signed)
Urological Symptom Review  Patient is experiencing the following symptoms: Frequent urination Get up at night to urinate Erection problems (male only)   Review of Systems  Gastrointestinal (upper)  : Negative for upper GI symptoms  Gastrointestinal (lower) : Negative for lower GI symptoms  Constitutional : Negative for symptoms  Skin: Skin rash/lesion  Eyes: Negative for eye symptoms  Ear/Nose/Throat : Negative for Ear/Nose/Throat symptoms  Hematologic/Lymphatic: Negative for Hematologic/Lymphatic symptoms  Cardiovascular : Negative for cardiovascular symptoms  Respiratory : Cough  Endocrine: Negative for endocrine symptoms  Musculoskeletal: Back pain Joint pain  Neurological: Negative for neurological symptoms  Psychologic: Anxiety

## 2021-08-01 ENCOUNTER — Telehealth: Payer: Self-pay | Admitting: Cardiology

## 2021-08-01 DIAGNOSIS — I10 Essential (primary) hypertension: Secondary | ICD-10-CM

## 2021-08-01 MED ORDER — CLONIDINE HCL 0.1 MG PO TABS: 0.1000 mg | ORAL_TABLET | Freq: Two times a day (BID) | ORAL | 3 refills | Status: DC

## 2021-08-01 NOTE — Telephone Encounter (Signed)
Patient states his PCP put him on a new BP medication cloNIDine (CATAPRES) 0.1 MG tablet, he states it doesn't seem to help him.  He wants to know if something else can be given to him.  He states he took the medication this morning he checked his BP 30 min later and it was 189/111.

## 2021-08-01 NOTE — Telephone Encounter (Signed)
Spoke with patient regarding Kristin's recommendations as noted. He voiced understanding of changes. Rx sent to pharmacy for increased clonidine. MyChart message sent with instructions also, as patient is not at home to record.

## 2021-08-01 NOTE — Telephone Encounter (Signed)
His OTC cold medicine includes phenylephrine which will raise his BP.  He needs to switch off the decongestant and take just antihistamine and cough suppressants.   I see that his pressure was high before he started this, but it is not helping.  He can take hot showers to let the steam get into his sinuses should he need help with sinus pressure.   As for his BP, he really needs to divide the meds into twice daily.  Clonidine is ineffective taken only once daily, lets increase it to twice daily (you may need to update his rx)  He needs to divide something like this:  AM:  losartan, hydrchlorothiazide, clonidine  PM:  metoprolol, amlodipine, clonidine  If he has already taken his meds today then have him take another dose of clonidine tonight and start the above schedule tomorrow.  It will take 4-5 days for his pressure to work its way back down to "normal" range, or whatever his new baseline will be.  May need to put him on CVRR schedule for 3-4 weeks out if he is willing.

## 2021-08-01 NOTE — Telephone Encounter (Signed)
Spoke with patient of Dr. Percival Spanish who reports elevated BP.  His PCP prescribed clonidine on 11/2 for elevated BP of 150/80 (note in epic).  He has an arm and wrist cuff at home.   BP before call was 189/111 after meds BP was elevated yesterday morning also  Current BP 182/113 w/wrist cuff - he has taken clonidine, hctz, metoprolol succinate, losartan, amlodipine (takes all meds with his 2nd cup of coffee) Current BP 171/91 w/arm cuff  His HR is 68bpm  He has distinct headaches in the evening occasionally. He has coughing spells and a cold/congestion at present -- will have coughing fits where its hard to get his breath. He is taking OTC meds for this -- day/night cold and flu (acetaminophen, dextromethorphan, phenylephrine). He reports BP has been high, prior to taking this OTC med. He may have picked up cold from his grandkids. He is still currently smoking (bupropion has been prescribed and he is taking)   Advised will send message to MD/Pharmacy team to review BP concerns and follow up with him

## 2021-08-14 ENCOUNTER — Telehealth: Payer: Self-pay | Admitting: Nurse Practitioner

## 2021-08-21 ENCOUNTER — Other Ambulatory Visit: Payer: Self-pay | Admitting: Nurse Practitioner

## 2021-08-21 MED ORDER — BENZONATATE 100 MG PO CAPS: 100.0000 mg | ORAL_CAPSULE | Freq: Three times a day (TID) | ORAL | 1 refills | Status: DC | PRN

## 2021-09-11 ENCOUNTER — Encounter: Payer: Self-pay | Admitting: Urology

## 2021-09-11 ENCOUNTER — Other Ambulatory Visit: Payer: Self-pay

## 2021-09-11 ENCOUNTER — Ambulatory Visit: Payer: BC Managed Care – PPO | Admitting: Urology

## 2021-09-11 VITALS — BP 132/77 | HR 59 | Ht 68.0 in | Wt 196.6 lb

## 2021-09-11 DIAGNOSIS — N433 Hydrocele, unspecified: Secondary | ICD-10-CM | POA: Diagnosis not present
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
## 2021-09-11 NOTE — Progress Notes (Signed)
Urological Symptom Review  Patient is experiencing the following symptoms: Frequent urination Erection problems  Review of Systems  Gastrointestinal (upper)  : Negative for upper GI symptoms  Gastrointestinal (lower) : Negative for lower GI symptoms  Constitutional : Negative for symptoms  Skin: Negative for skin symptoms  Eyes: Blurred vision  Ear/Nose/Throat : Negative for Ear/Nose/Throat symptoms  Hematologic/Lymphatic: Negative for Hematologic/Lymphatic symptoms  Cardiovascular : Negative for cardiovascular symptoms  Respiratory : Cough  Endocrine: Negative for endocrine symptoms  Musculoskeletal: Back pain  Neurological: Negative for neurological symptoms  Psychologic: Negative for psychiatric symptoms

## 2021-10-10 ENCOUNTER — Ambulatory Visit: Payer: BC Managed Care – PPO | Admitting: Nurse Practitioner

## 2021-10-10 ENCOUNTER — Encounter: Payer: Self-pay | Admitting: Nurse Practitioner

## 2021-10-10 ENCOUNTER — Other Ambulatory Visit: Payer: Self-pay | Admitting: Nurse Practitioner

## 2021-10-10 VITALS — BP 136/87 | HR 88 | Temp 98.2°F | Resp 20 | Ht 68.0 in | Wt 196.0 lb

## 2021-10-10 DIAGNOSIS — K529 Noninfective gastroenteritis and colitis, unspecified: Secondary | ICD-10-CM

## 2021-10-10 MED ORDER — ONDANSETRON HCL 4 MG PO TABS: 4.0000 mg | ORAL_TABLET | Freq: Three times a day (TID) | ORAL | 0 refills | Status: DC | PRN

## 2021-10-10 NOTE — Patient Instructions (Signed)

## 2021-10-10 NOTE — Progress Notes (Signed)
Subjective:    Patient ID: Eric Saunders, male    DOB: 1956/08/23, 66 y.o.   MRN: [id number]   Chief Complaint: Diarrhea (Started Friday) and Blood pressure low (Seen at urgent care on Sunday. Given IV. Felt better Monday and then that night sick again with vomiting and diarrhea)   HPI Patient went to the ED on Friday with vomiting and diarrhea. He was given Iv fluids and sent home. He felt some better. Then on Monday he got sick again . He cant keep anything down, liquids or food. He has not taken anything OTC other then cloicidin for flu. He has tested negative for flu and covid. His other concern was that his blood pressure was very low.     Review of Systems  Constitutional:  Negative for chills and fever.  HENT: Negative.    Gastrointestinal:  Positive for diarrhea, nausea and vomiting. Negative for abdominal pain.  Genitourinary: Negative.   Neurological: Negative.       Objective:   Physical Exam Constitutional:      Appearance: Normal appearance.  Cardiovascular:     Rate and Rhythm: Normal rate and regular rhythm.     Heart sounds: Normal heart sounds.  Pulmonary:     Effort: Pulmonary effort is normal.     Breath sounds: Normal breath sounds.  Abdominal:     General: Abdomen is flat. Bowel sounds are normal.     Palpations: Abdomen is soft.  Skin:    General: Skin is warm.  Neurological:     General: No focal deficit present.     Mental Status: He is alert and oriented to person, place, and time.  Psychiatric:        Mood and Affect: Mood normal.        Behavior: Behavior normal.    BP 136/87    Pulse 88    Temp 98.2 F (36.8 C) (Temporal)    Resp 20    Ht 5\' 8"  (1.727 m)    Wt 196 lb (88.9 kg)    SpO2 98%    BMI 29.80 kg/m        Assessment & Plan:   Eric Saunders in today with chief complaint of Diarrhea (Started Friday) and Blood pressure low (Seen at urgent care on Sunday. Given IV. Felt better Monday and then that night sick again with vomiting  and diarrhea)   1. Gastroenteritis First 24 Hours-Clear liquids  popsicles  Jello  gatorade  Sprite Second 24 hours-Add Full liquids ( Liquids you cant see through) Third 24 hours- Bland diet ( foods that are baked or broiled)  *avoiding fried foods and highly spiced foods* During these 3 days  Avoid milk, cheese, ice cream or any other dairy products  Avoid caffeine- REMEMBER Mt. Dew and Mello Yellow contain lots of caffeine You should eat and drink in  Frequent small volumes If no improvement in symptoms or worsen in 2-3 days should RETRUN TO OFFICE or go to ER!   Imodium AD OTC for diarhea - Cdiff NAA+O+P+Stool Culture  Meds ordered this encounter  Medications   ondansetron (ZOFRAN) 4 MG tablet    Sig: Take 1 tablet (4 mg total) by mouth every 8 (eight) hours as needed for nausea or vomiting.    Dispense:  20 tablet    Refill:  0    Order Specific Question:   Supervising Provider    Answer:   Caryl Pina A [id number]     The  above assessment and management plan was discussed with the patient. The patient verbalized understanding of and has agreed to the management plan. Patient is aware to call the clinic if symptoms persist or worsen. Patient is aware when to return to the clinic for a follow-up visit. Patient educated on when it is appropriate to go to the emergency department.   Mary-Margaret Hassell Done, FNP

## 2021-10-14 LAB — CDIFF NAA+O+P+STOOL CULTURE
E coli, Shiga toxin Assay: NEGATIVE
Toxigenic C. Difficile by PCR: NEGATIVE

## 2021-10-16 DIAGNOSIS — H25811 Combined forms of age-related cataract, right eye: Secondary | ICD-10-CM | POA: Insufficient documentation

## 2021-10-24 ENCOUNTER — Encounter (HOSPITAL_COMMUNITY): Payer: Self-pay

## 2021-10-24 NOTE — Progress Notes (Signed)
Attempted to reach patient regarding follow-up LDCT. Unable to reach patient at this time. Detailed VM left asking that the patient return my call.

## 2021-11-11 NOTE — Progress Notes (Signed)
?  ?Cardiology Office Note ? ? ?Date:  11/14/2021  ? ?ID:  Eric Saunders, DOB [birth year], MRN [id number] ? ?PCP:  [name], FNP  ?Cardiologist:   Minus Breeding, MD ? ? ?Chief Complaint  ?Patient presents with  ? Cough  ? ?  ?History of Present Illness: ?Eric Saunders is a 66 y.o. male who presents for followup after CABG. in 2020 he had some increased dyspnea.  I sent him for a POET (Plain Old Exercise Treadmill) and he had no evidence of ischemia.   He did have a bit of a hypertensive blood pressure response.  He was hospitalized in 2021 with syncope.  He has had about five or six episodes in the last 6 months.  These are always associated with cough.  He had an EEG which was negative.  MRI brain was unremarkable.  Carotid Dopplers were unremarkable.  He is being treated with higher dose Prilosec 40 mg twice daily and is having less cough and less syncope.  He had his esophagus stretched.  At the last visit he had had less coughing and no syncope. ? ?He says he still having some cough but is not as bad as it was.  He has not had any syncope however.  He says sometimes he will start to panic or be coughing and his arm on the left side will start to "draw up."  His wife tells him to do deep breathing and he has been able to avoid any full-blown syncope.  He denies any new chest pressure, neck or arm discomfort.  He had no new weight gain or edema.  He has had no palpitations.  He has no shortness of breath, PND or orthopnea. ? ? ?Past Medical History:  ?Diagnosis Date  ? Arthritis   ? COPD (chronic obstructive pulmonary disease) ([place])   ? Coronary artery disease   ? Depression   ? GERD (gastroesophageal reflux disease)   ? History of angina   ? Hyperlipemia   ? Hypertension   ? Panic disorder   ? Tobacco abuse   ? ? ?Past Surgical History:  ?Procedure Laterality Date  ? COLONOSCOPY    ? CORONARY ARTERY BYPASS GRAFT N/A 06/17/2014  ? Procedure: CORONARY ARTERY BYPASS GRAFTING (CABG) X 4, RIGHT LEG EVH;   Surgeon: Gaye Pollack, MD;  Location: [facility];  Service: Open Heart Surgery;  Laterality: N/A;  ? KNEE SURGERY    ? bilateral arthroscopic  ? LEFT HEART CATHETERIZATION WITH CORONARY ANGIOGRAM N/A 06/16/2014  ? Procedure: LEFT HEART CATHETERIZATION WITH CORONARY ANGIOGRAM;  Surgeon: Burnell Blanks, MD;  Location: [facility] CATH LAB;  Service: Cardiovascular;  Laterality: N/A;  ? TEE WITHOUT CARDIOVERSION N/A 06/17/2014  ? Procedure: TRANSESOPHAGEAL ECHOCARDIOGRAM (TEE);  Surgeon: Gaye Pollack, MD;  Location: [facility];  Service: Open Heart Surgery;  Laterality: N/A;  ? UPPER GASTROINTESTINAL ENDOSCOPY    ? ? ? ?Current Outpatient Medications  ?Medication Sig Dispense Refill  ? ALPRAZolam (XANAX) 0.5 MG tablet Take 1 tablet (0.5 mg total) by mouth 2 (two) times daily as needed for anxiety. 60 tablet 2  ? amLODipine (NORVASC) 10 MG tablet Take 1 tablet (10 mg total) by mouth daily. 90 tablet 1  ? aspirin 81 MG tablet Take 81 mg by mouth daily.    ? cloNIDine (CATAPRES) 0.1 MG tablet Take 1 tablet (0.1 mg total) by mouth 2 (two) times daily. 180 tablet 3  ? cyclobenzaprine (FLEXERIL) 10 MG tablet Take 10 mg by  mouth 2 (two) times daily as needed.    ? escitalopram (LEXAPRO) 20 MG tablet Take 2 tablets (40 mg total) by mouth daily. 180 tablet 1  ? fenofibrate 160 MG tablet Take 1 tablet (160 mg total) by mouth daily. 90 tablet 1  ? hydrochlorothiazide (HYDRODIURIL) 25 MG tablet Take 1 tablet (25 mg total) by mouth daily. 90 tablet 1  ? ibuprofen (ADVIL) 600 MG tablet Take 800 mg by mouth every 6 (six) hours as needed.    ? losartan (COZAAR) 100 MG tablet Take 1 tablet (100 mg total) by mouth daily. 90 tablet 1  ? metoprolol succinate (TOPROL-XL) 25 MG 24 hr tablet Take 1 tablet (25 mg total) by mouth daily. 90 tablet 1  ? omeprazole (PRILOSEC) 20 MG capsule Take 1 capsule (20 mg total) by mouth 2 (two) times daily before lunch and supper. 90 capsule 1  ? ondansetron (ZOFRAN) 4 MG tablet Take 1 tablet (4 mg total) by mouth  every 8 (eight) hours as needed for nausea or vomiting. 20 tablet 0  ? rosuvastatin (CRESTOR) 40 MG tablet Take 1 tablet (40 mg total) by mouth daily. 90 tablet 3  ? triamcinolone cream (KENALOG) 0.1 % Apply 1 application topically 2 (two) times daily. 453.6 g 1  ? ?No current facility-administered medications for this visit.  ? ? ?Allergies:   Ace inhibitors  ? ? ?ROS:  Please see the history of present illness.   Otherwise, review of systems are positive for none.   All other systems are reviewed and negative.  ? ? ?PHYSICAL EXAM: ?VS:  BP 110/64   Pulse 67   Ht 5\' 8"  (1.727 m)   Wt 199 lb (90.3 kg)   BMI 30.26 kg/m?  , BMI Body mass index is 30.26 kg/m?. ?GENERAL:  Well appearing ?NECK:  No jugular venous distention, waveform within normal limits, carotid upstroke brisk and symmetric, no bruits, no thyromegaly ?LUNGS:  Clear to auscultation bilaterally ?CHEST:  Well healed sternotomy scar. ?HEART:  PMI not displaced or sustained,S1 and S2 within normal limits, no S3, no S4, no clicks, no rubs, no murmurs ?ABD:  Flat, positive bowel sounds normal in frequency in pitch, no bruits, no rebound, no guarding, no midline pulsatile mass, no hepatomegaly, no splenomegaly ?EXT:  2 plus pulses throughout, no edema, no cyanosis no clubbing ? ? ?EKG:  EKG is  ordered today. ?Sinus rhythm, rate 67, axis within normal limits, intervals within normal limits, no acute ST-T wave changes. ? ?Recent Labs: ?07/18/2021: ALT 12; BUN 24; Creatinine, Ser 1.15; Hemoglobin 16.1; Platelets 350; Potassium 4.8; Sodium 140  ? ? ?Lipid Panel ?   ?Component Value Date/Time  ? CHOL 184 07/18/2021 1147  ? CHOL 166 12/28/2012 0932  ? TRIG 148 07/18/2021 1147  ? TRIG 157 (H) 11/23/2014 1549  ? TRIG 129 12/28/2012 0932  ? HDL 52 07/18/2021 1147  ? HDL 44 11/23/2014 1549  ? HDL 42 12/28/2012 0932  ? CHOLHDL 3.5 07/18/2021 1147  ? CHOLHDL 5.6 01/04/2020 0210  ? VLDL 64 (H) 01/04/2020 0210  ? LDLCALC 106 (H) 07/18/2021 1147  ? [place] 90 07/06/2014  0000  ? [place] 98 12/28/2012 0932  ? ?  ? ?Wt Readings from Last 3 Encounters:  ?11/14/21 199 lb (90.3 kg)  ?10/10/21 196 lb (88.9 kg)  ?09/11/21 196 lb 9.6 oz (89.2 kg)  ?  ? ? ?Other studies Reviewed: ?Additional studies/ records that were reviewed today include: Labs ?Review of the above records demonstrates:  Please see elsewhere in the note.   ? ? ?ASSESSMENT AND PLAN: ? ?CAD/CABG:   The patient has no new sypmtoms.  No further cardiovascular testing is indicated.  We will continue with aggressive risk reduction and meds as listed. ? ?SYNCOPE:     He has had no further episodes.  I think this was probably called syncope.  No change in therapy or further work-up.  Has had an extensive work-up.  ? ?TOBACCO:    He has not quit smoking and he and I have talked at length about this.  ? ?DYSLIPIDEMIA:   LDL was not at target.  LDL was 106 on a higher dose of statin and I am going to increase to Crestor 40 mg daily and repeat a lipid profile in about 10 weeks.  ? ?HTN:  The blood pressure is at target.  No change in therapy.  Mildly elevated today but this is unusual.  No change in therapy.  He will keep a check on his blood pressure. ?  ? ?Current medicines are reviewed at length with the patient today.  The patient does not have concerns regarding medicines. ? ?The following changes have been made: As above ? ?Labs/ tests ordered today include:  ? ?Orders Placed This Encounter  ?Procedures  ? Lipid panel  ? EKG 12-Lead  ? ? ? ?Disposition:   FU with me in 12 months.  ? ? ?Signed, ?Minus Breeding, MD  ?11/14/2021 1:49 PM    ?[place] ?

## 2021-11-14 ENCOUNTER — Other Ambulatory Visit: Payer: Self-pay

## 2021-11-14 ENCOUNTER — Other Ambulatory Visit: Payer: Self-pay | Admitting: *Deleted

## 2021-11-14 ENCOUNTER — Ambulatory Visit (INDEPENDENT_AMBULATORY_CARE_PROVIDER_SITE_OTHER): Payer: BC Managed Care – PPO | Admitting: Cardiology

## 2021-11-14 ENCOUNTER — Encounter: Payer: Self-pay | Admitting: Cardiology

## 2021-11-14 VITALS — BP 110/64 | HR 67 | Ht 68.0 in | Wt 199.0 lb

## 2021-11-14 DIAGNOSIS — I2510 Atherosclerotic heart disease of native coronary artery without angina pectoris: Secondary | ICD-10-CM | POA: Diagnosis not present

## 2021-11-14 DIAGNOSIS — I10 Essential (primary) hypertension: Secondary | ICD-10-CM

## 2021-11-14 DIAGNOSIS — E785 Hyperlipidemia, unspecified: Secondary | ICD-10-CM | POA: Diagnosis not present

## 2021-11-14 DIAGNOSIS — R55 Syncope and collapse: Secondary | ICD-10-CM | POA: Diagnosis not present

## 2021-11-14 MED ORDER — ROSUVASTATIN CALCIUM 40 MG PO TABS: 40.0000 mg | ORAL_TABLET | Freq: Every day | ORAL | 3 refills | Status: DC

## 2021-11-14 NOTE — Patient Instructions (Addendum)
Medication Instructions:  ?Please increase your Crestor to 40 mg a day. ?Continue all other medications as listed. ? ?*If you need a refill on your cardiac medications before your next appointment, please call your pharmacy* ? ?Lab:  Please have blood work in 10 weeks (Lipid) at [facility]. ? ?Follow-Up: ?At [facility], you and your health needs are our priority.  As part of our continuing mission to provide you with exceptional heart care, we have created designated Provider Care Teams.  These Care Teams include your primary Cardiologist (physician) and Advanced Practice Providers (APPs -  Physician Assistants and Nurse Practitioners) who all work together to provide you with the care you need, when you need it. ? ?We recommend signing up for the patient portal called "MyChart".  Sign up information is provided on this After Visit Summary.  MyChart is used to connect with patients for Virtual Visits (Telemedicine).  Patients are able to view lab/test results, encounter notes, upcoming appointments, etc.  Non-urgent messages can be sent to your provider as well.   ?To learn more about what you can do with MyChart, go to [email or web address].   ? ?Your next appointment:   ?1 year(s) ? ?The format for your next appointment:   ?In Person ? ?Provider:   ?Minus Breeding, MD  ? ? ?Thank you for choosing [facility]!! ? ? ? ?

## 2021-11-20 ENCOUNTER — Encounter: Payer: Self-pay | Admitting: Nurse Practitioner

## 2021-11-20 ENCOUNTER — Ambulatory Visit (INDEPENDENT_AMBULATORY_CARE_PROVIDER_SITE_OTHER): Payer: BC Managed Care – PPO

## 2021-11-20 ENCOUNTER — Ambulatory Visit: Payer: BC Managed Care – PPO | Admitting: Nurse Practitioner

## 2021-11-20 VITALS — BP 107/68 | HR 65 | Temp 97.9°F | Resp 20 | Ht 68.0 in | Wt 191.0 lb

## 2021-11-20 DIAGNOSIS — M954 Acquired deformity of chest and rib: Secondary | ICD-10-CM | POA: Diagnosis not present

## 2021-11-20 DIAGNOSIS — K432 Incisional hernia without obstruction or gangrene: Secondary | ICD-10-CM

## 2021-11-20 NOTE — Patient Instructions (Signed)
Hernia, Adult ?  ?A hernia is the bulging of an organ or tissue through a weak spot in the muscles of the abdomen. Hernias develop most often near the belly button (navel) or the area where the leg meets the lower abdomen (groin). ?Common types of hernias include: ?Incisional hernia. This type bulges through a scar from an abdominal surgery. ?Umbilical hernia. This type develops near the navel. ?Inguinal hernia. This type develops in the groin or scrotum. ?Femoral hernia. This type develops below the groin, in the upper thigh area. ?Hiatal hernia. This type occurs when part of the stomach slides above the muscle that separates the abdomen from the chest (diaphragm). ?What are the causes? ?This condition may be caused by: ?Heavy lifting. ?Coughing over a long period of time. ?Straining to have a bowel movement. Constipation can lead to straining. ?An incision made during abdominal surgery. ?A physical problem that is present at birth (congenital defect). ?Being overweight or obese. ?Smoking. ?Excess fluid in the abdomen. ?Undescended testicles in males. ?What are the signs or symptoms? ?The main symptom is a skin-colored, rounded bulge in the area of the hernia. However, a bulge may not always be present. It may grow bigger or be more visible when you cough or strain (such as when lifting something heavy). ?A hernia that can be pushed back into the abdomen (is reducible) rarely causes pain. A hernia that cannot be pushed back into the abdomen (is incarcerated) may lose its blood supply (become strangulated). A hernia that is incarcerated may cause: ?Pain. ?Fever. ?Nausea and vomiting. ?Swelling. ?Constipation. ?How is this diagnosed? ?A hernia may be diagnosed based on: ?Your symptoms and medical history. ?A physical exam. Your health care provider may ask you to cough or move in certain ways to see if the hernia becomes visible. ?Imaging tests, such as: ?X-rays. ?Ultrasound. ?CT scan. ?How is this treated? ?A hernia  that is small and painless may not need to be treated. A hernia that is large or painful may be treated with surgery. Inguinal hernias may be treated with surgery to prevent incarceration or strangulation. Strangulated hernias are always treated with surgery because the strangulation causes a lack of blood supply to the trapped organ or tissue. ?Surgery to treat a hernia involves pushing the bulge back into place and repairing the weak area of the muscle or abdominal wall. ?Follow these instructions at home: ?Activity ?Avoid straining. ?Do not lift anything that is heavier than 10 lb (4.5 kg), or the limit that you are told, until your health care provider says that it is safe. ?When lifting heavy objects, lift with your leg muscles, not your back muscles. ?Preventing constipation ?Take actions to prevent constipation. Constipation leads to straining with bowel movements, which can make a hernia worse or cause a hernia repair to break down. Your health care provider may recommend that you take these actions to prevent or treat constipation: ?Drink enough fluid to keep your urine pale yellow. ?Take over-the-counter or prescription medicines. ?Eat foods that are high in fiber, such as beans, whole grains, and fresh fruits and vegetables. ?Limit foods that are high in fat and processed sugars, such as fried or sweet foods. ?General instructions ?When coughing, try to cough gently. ?You may try to push the hernia back in place by very gently pressing on it while lying down. Do not try to force the bulge back in if it will not push in easily. ?If you are overweight, work with your health care provider to lose  weight safely. ?Do not use any products that contain nicotine or tobacco. These products include cigarettes, chewing tobacco, and vaping devices, such as e-cigarettes. If you need help quitting, ask your health care provider. ?If you are scheduled for hernia repair, watch your hernia for any changes in shape, size,  or color. Tell your health care provider about any changes or new symptoms. ?Take over-the-counter and prescription medicines only as told by your health care provider. ?Keep all follow-up visits. This is important. ?Contact a health care provider if: ?You develop new pain, swelling, or redness around your hernia. ?You have signs of constipation, such as: ?Fewer bowel movements in a week than normal. ?Difficulty having a bowel movement. ?Stools that are dry, hard, or larger than normal. ?Get help right away if: ?You have a fever or chills. ?You have abdominal pain that gets worse. ?You feel nauseous or you vomit. ?You cannot push the hernia back in place by very gently pressing on it while lying down. Do not try to force the bulge back in if it will not go in easily. ?The hernia: ?Changes in shape, size, or color. ?Feels hard or tender. ?These symptoms may represent a serious problem that is an emergency. Do not wait to see if the symptoms will go away. Get medical help right away. Call your local emergency services (911 in the [place] Do not drive yourself to the hospital. ?Summary ?A hernia is the bulging of an organ or tissue through a weak spot in the muscles of the abdomen. ?The main symptom is a skin-colored bulge in the hernia area. However, a bulge may not always be present. It may grow bigger or more visible when you cough or strain (such as when having a bowel movement). ?A hernia that is small and painless may not need to be treated. A hernia that is large or painful may be treated with surgery. ?Surgery to treat a hernia involves pushing the bulge back into place and repairing the weak part of the abdomen. ?This information is not intended to replace advice given to you by your health care provider. Make sure you discuss any questions you have with your health care provider. ?Document Revised: 04/10/2020 Document Reviewed: 04/10/2020 ?Elsevier Patient Education ? [place]. ? ?

## 2021-11-20 NOTE — Progress Notes (Addendum)
? ?  Subjective:  ? ? Patient ID: Eric Saunders, male    DOB: [birth year], 66 y.o.   MRN: [id number] ? ? ?Chief Complaint: Lump on left side of chest ? ? ?HPI ?Patient comes in c/o lump on left side of chest. He leaned over to pet his dog and he felt a bulging area on left side of chest wall. His wife said she heard a popping noise when occurred. He pushed on it and it went away. Hurst when he coughs. ? ?Review of Systems  ?Constitutional:  Negative for diaphoresis.  ?Eyes:  Negative for pain.  ?Respiratory:  Negative for shortness of breath.   ?Cardiovascular:  Negative for chest pain, palpitations and leg swelling.  ?Gastrointestinal:  Negative for abdominal pain.  ?Endocrine: Negative for polydipsia.  ?Skin:  Negative for rash.  ?Neurological:  Negative for dizziness, weakness and headaches.  ?Hematological:  Does not bruise/bleed easily.  ?All other systems reviewed and are negative. ? ?   ?Objective:  ? Physical Exam ?Vitals reviewed.  ?Constitutional:   ?   Appearance: Normal appearance.  ?Cardiovascular:  ?   Rate and Rhythm: Normal rate and regular rhythm.  ?   Heart sounds: Normal heart sounds.  ?Pulmonary:  ?   Effort: Pulmonary effort is normal.  ?   Breath sounds: Normal breath sounds.  ?Abdominal:  ?   Hernia: A hernia (incisionla hernia center of upper abdomen. Nontender) is present.  ?Neurological:  ?   Mental Status: He is alert.  ? ?BP 107/68   Pulse 65   Temp 97.9 ?F (36.6 ?C) (Temporal)   Resp 20   Ht '5\' 8"'$  (1.727 m)   Wt 191 lb (86.6 kg)   SpO2 96%   BMI 29.04 kg/m?  ? ?Chest xray- normal- unchanged from previous-Preliminary reading by Ronnald Collum, FNP  [facility] ? ? ? ? ?   ?Assessment & Plan:  ? ?Eric Saunders in today with chief complaint of Lump on left side of chest ? ? ?1. Chest wall deformity ?normal ?- DG Chest 2 View ? ?2. Incisional hernia, without obstruction or gangrene ?Report any pain or increased bulging ?Avoid straining ? ?Patient wants to hold HCTZ because he is voiding so much.  He will keep a check of blood pressure at home and bring readings to next visit. ? ?The above assessment and management plan was discussed with the patient. The patient verbalized understanding of and has agreed to the management plan. Patient is aware to call the clinic if symptoms persist or worsen. Patient is aware when to return to the clinic for a follow-up visit. Patient educated on when it is appropriate to go to the emergency department.  ? ?Mary-Margaret Hassell Done, FNP ? ? ?

## 2021-12-17 ENCOUNTER — Encounter: Payer: Self-pay | Admitting: Nurse Practitioner

## 2021-12-17 ENCOUNTER — Ambulatory Visit: Payer: BC Managed Care – PPO | Admitting: Nurse Practitioner

## 2021-12-17 VITALS — BP 129/74 | HR 81 | Temp 98.1°F | Resp 20 | Ht 68.0 in | Wt 198.0 lb

## 2021-12-17 DIAGNOSIS — F411 Generalized anxiety disorder: Secondary | ICD-10-CM

## 2021-12-17 DIAGNOSIS — I10 Essential (primary) hypertension: Secondary | ICD-10-CM

## 2021-12-17 DIAGNOSIS — M109 Gout, unspecified: Secondary | ICD-10-CM

## 2021-12-17 DIAGNOSIS — J449 Chronic obstructive pulmonary disease, unspecified: Secondary | ICD-10-CM

## 2021-12-17 DIAGNOSIS — K219 Gastro-esophageal reflux disease without esophagitis: Secondary | ICD-10-CM

## 2021-12-17 DIAGNOSIS — E669 Obesity, unspecified: Secondary | ICD-10-CM

## 2021-12-17 DIAGNOSIS — M545 Low back pain, unspecified: Secondary | ICD-10-CM

## 2021-12-17 DIAGNOSIS — I25810 Atherosclerosis of coronary artery bypass graft(s) without angina pectoris: Secondary | ICD-10-CM | POA: Diagnosis not present

## 2021-12-17 DIAGNOSIS — Z125 Encounter for screening for malignant neoplasm of prostate: Secondary | ICD-10-CM

## 2021-12-17 DIAGNOSIS — F17200 Nicotine dependence, unspecified, uncomplicated: Secondary | ICD-10-CM

## 2021-12-17 DIAGNOSIS — E785 Hyperlipidemia, unspecified: Secondary | ICD-10-CM | POA: Diagnosis not present

## 2021-12-17 DIAGNOSIS — G8929 Other chronic pain: Secondary | ICD-10-CM

## 2021-12-17 MED ORDER — OMEPRAZOLE 20 MG PO CPDR: 20.0000 mg | DELAYED_RELEASE_CAPSULE | Freq: Two times a day (BID) | ORAL | 1 refills | Status: DC

## 2021-12-17 MED ORDER — ESCITALOPRAM OXALATE 20 MG PO TABS: 40.0000 mg | ORAL_TABLET | Freq: Every day | ORAL | 1 refills | Status: DC

## 2021-12-17 MED ORDER — CYCLOBENZAPRINE HCL 10 MG PO TABS: 10.0000 mg | ORAL_TABLET | Freq: Two times a day (BID) | ORAL | 5 refills | Status: DC | PRN

## 2021-12-17 MED ORDER — IBUPROFEN 600 MG PO TABS: 600.0000 mg | ORAL_TABLET | Freq: Four times a day (QID) | ORAL | 5 refills | Status: DC | PRN

## 2021-12-17 MED ORDER — CLONIDINE HCL 0.1 MG PO TABS: 0.1000 mg | ORAL_TABLET | Freq: Two times a day (BID) | ORAL | 3 refills | Status: DC

## 2021-12-17 MED ORDER — ALPRAZOLAM 0.5 MG PO TABS: 0.5000 mg | ORAL_TABLET | Freq: Two times a day (BID) | ORAL | 2 refills | Status: DC | PRN

## 2021-12-17 MED ORDER — ROSUVASTATIN CALCIUM 40 MG PO TABS: 40.0000 mg | ORAL_TABLET | Freq: Every day | ORAL | 3 refills | Status: DC

## 2021-12-17 MED ORDER — HYDROCHLOROTHIAZIDE 25 MG PO TABS: 25.0000 mg | ORAL_TABLET | Freq: Every day | ORAL | 1 refills | Status: DC

## 2021-12-17 MED ORDER — AMLODIPINE BESYLATE 10 MG PO TABS: 10.0000 mg | ORAL_TABLET | Freq: Every day | ORAL | 1 refills | Status: DC

## 2021-12-17 MED ORDER — LOSARTAN POTASSIUM 100 MG PO TABS: 100.0000 mg | ORAL_TABLET | Freq: Every day | ORAL | 1 refills | Status: DC

## 2021-12-17 MED ORDER — FENOFIBRATE 160 MG PO TABS: 160.0000 mg | ORAL_TABLET | Freq: Every day | ORAL | 1 refills | Status: DC

## 2021-12-17 MED ORDER — METOPROLOL SUCCINATE ER 25 MG PO TB24: 25.0000 mg | ORAL_TABLET | Freq: Every day | ORAL | 1 refills | Status: DC

## 2021-12-17 NOTE — Addendum Note (Signed)
Addended by: [name] on: 12/17/2021 10:27 AM ? ? Modules accepted: Orders ? ?

## 2021-12-17 NOTE — Patient Instructions (Signed)

## 2021-12-17 NOTE — Progress Notes (Signed)
? ?Subjective:  ? ? Patient ID: Eric Saunders, male    DOB: Oct 12, 1955, 66 y.o.   MRN: [id number] ? ? ?Chief Complaint: medical management of chronic issues  ?  ? ?HPI: ? ?Eric Saunders is a 66 y.o. who identifies as a male who was assigned male at birth.  ? ?Social history: ?Lives with: wife ?Work history: is now on disability ? ? ?Comes in today for follow up of the following chronic medical issues: ? ?1. Essential hypertension ? C/o chest pain, sob or headache. Does not check blood pressure at home. ?BP Readings from Last 3 Encounters:  ?11/20/21 107/68  ?11/14/21 110/64  ?10/10/21 136/87  ? ? ? ?2. Hyperlipidemia with target LDL less than 100 ?Doies try to wtahc diet and stays active, but no dedicated exercise.Marland Kitchen ?Lab Results  ?Component Value Date  ? CHOL 184 07/18/2021  ? HDL 52 07/18/2021  ? LDLCALC 106 (H) 07/18/2021  ? TRIG 148 07/18/2021  ? CHOLHDL 3.5 07/18/2021  ? ?The 10-year ASCVD risk score (Arnett DK, et al., 2019) is: 12.5% ? ? ?3. Coronary artery disease involving autologous vein coronary bypass graft without angina pectoris ?Patient last saw cardiology on 11/14/21. Review of office showed the only change to plan of care was increase crestor to 41m daily. ? ?4. Chronic obstructive pulmonary disease, unspecified COPD type (HNunez ?Denies any SOB . Is currently not on any inhalers ? ?5. Gastroesophageal reflux disease without esophagitis ?Is on omeprazole daily and is doing well. ? ?6. Acute gouty arthritis ?No recent gout fare ups ? ?7. GAD (generalized anxiety disorder) ?Is on xanax BID. 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 ?  12/17/2021  ?  9:48 AM 11/20/2021  ? 11:06 AM 07/18/2021  ? 11:01 AM 01/08/2021  ?  8:22 AM  ?GAD 7 : Generalized Anxiety Score  ?Nervous, Anxious, on Edge _0 ?Control/stop worrying 0 0 1 3  ?Worry too much - different things 1 0 1 3  ?Trouble relaxing 0 _1 ?Restless _2 ?Easily annoyed or irritable _3 ?Afraid - awful might happen 0 0 0 1  ?Total GAD 7 Score _4 ?Anxiety Difficulty Somewhat difficult Not difficult at all Not difficult at all Not difficult at all  ? ? ? ? ?8. Tobacco use disorder ?He still smokes over a pack a day despite the riske that he is aware of. ? ?9. Obesity (BMI 30.0-34.9) ?No recent weight changes ?Wt Readings from Last 3 Encounters:  ?12/17/21 198 lb (89.8 kg)  ?11/20/21 191 lb (86.6 kg)  ?11/14/21 199 lb (90.3 kg)  ? ?BMI Readings from Last 3 Encounters:  ?12/17/21 30.11 kg/m?  ?11/20/21 29.04 kg/m?  ?11/14/21 30.26 kg/m?  ? ? ? ?New complaints: ?Chronic back pain, in which he takes flexeril for. ? ?Allergies  ?Allergen Reactions  ? Ace Inhibitors Cough  ? ?Outpatient Encounter Medications as of 12/17/2021  ?Medication Sig  ? ALPRAZolam (XANAX) 0.5 MG tablet Take 1 tablet (0.5 mg total) by mouth 2 (two) times daily as needed for anxiety.  ? amLODipine (NORVASC) 10 MG tablet Take 1 tablet (10 mg total) by mouth daily.  ? aspirin 81 MG tablet Take 81 mg by mouth daily.  ? cloNIDine (CATAPRES) 0.1 MG tablet Take 1 tablet (0.1 mg total) by mouth 2 (two) times  daily.  ? cyclobenzaprine (FLEXERIL) 10 MG tablet Take 10 mg by mouth 2 (two) times daily as needed.  ? escitalopram (LEXAPRO) 20 MG tablet Take 2 tablets (40 mg total) by mouth daily.  ? fenofibrate 160 MG tablet Take 1 tablet (160 mg total) by mouth daily.  ? hydrochlorothiazide (HYDRODIURIL) 25 MG tablet Take 1 tablet (25 mg total) by mouth daily.  ? ibuprofen (ADVIL) 600 MG tablet Take 800 mg by mouth every 6 (six) hours as needed.  ? losartan (COZAAR) 100 MG tablet Take 1 tablet (100 mg total) by mouth daily.  ? metoprolol succinate (TOPROL-XL) 25 MG 24 hr tablet Take 1 tablet (25 mg total) by mouth daily.  ? omeprazole (PRILOSEC) 20 MG capsule Take 1 capsule (20 mg total) by mouth 2 (two) times daily before lunch and supper.  ? ondansetron (ZOFRAN) 4 MG tablet Take 1 tablet (4 mg total) by mouth every 8 (eight) hours as  needed for nausea or vomiting.  ? rosuvastatin (CRESTOR) 40 MG tablet Take 1 tablet (40 mg total) by mouth daily.  ? triamcinolone cream (KENALOG) 0.1 % Apply 1 application topically 2 (two) times daily.  ? ?No facility-administered encounter medications on file as of 12/17/2021.  ? ? ?Past Surgical History:  ?Procedure Laterality Date  ? COLONOSCOPY    ? CORONARY ARTERY BYPASS GRAFT N/A 06/17/2014  ? Procedure: CORONARY ARTERY BYPASS GRAFTING (CABG) X 4, RIGHT LEG EVH;  Surgeon: Gaye Pollack, MD;  Location: [facility];  Service: Open Heart Surgery;  Laterality: N/A;  ? KNEE SURGERY    ? bilateral arthroscopic  ? LEFT HEART CATHETERIZATION WITH CORONARY ANGIOGRAM N/A 06/16/2014  ? Procedure: LEFT HEART CATHETERIZATION WITH CORONARY ANGIOGRAM;  Surgeon: Burnell Blanks, MD;  Location: [facility] CATH LAB;  Service: Cardiovascular;  Laterality: N/A;  ? TEE WITHOUT CARDIOVERSION N/A 06/17/2014  ? Procedure: TRANSESOPHAGEAL ECHOCARDIOGRAM (TEE);  Surgeon: Gaye Pollack, MD;  Location: [facility];  Service: Open Heart Surgery;  Laterality: N/A;  ? UPPER GASTROINTESTINAL ENDOSCOPY    ? ? ?Family History  ?Problem Relation Age of Onset  ? Drug abuse Sister   ? Healthy Brother   ? Diabetes Sister   ? Healthy Sister   ? Healthy Sister   ? Healthy Sister   ? Healthy Sister   ? Healthy Brother   ? Colon cancer Neg Hx   ? Esophageal cancer Neg Hx   ? Inflammatory bowel disease Neg Hx   ? Liver disease Neg Hx   ? Pancreatic cancer Neg Hx   ? Rectal cancer Neg Hx   ? Stomach cancer Neg Hx   ? ? ? ? ?Controlled substance contract: 07/26/21 ? ? ? ? ?Review of Systems  ?Constitutional:  Negative for diaphoresis.  ?Eyes:  Negative for pain.  ?Respiratory:  Negative for shortness of breath.   ?Cardiovascular:  Negative for chest pain, palpitations and leg swelling.  ?Gastrointestinal:  Negative for abdominal pain.  ?Endocrine: Negative for polydipsia.  ?Skin:  Negative for rash.  ?Neurological:  Negative for dizziness, weakness and headaches.   ?Hematological:  Does not bruise/bleed easily.  ?All other systems reviewed and are negative. ? ?   ?Objective:  ? Physical Exam ?Vitals and nursing note reviewed.  ?Constitutional:   ?   Appearance: Normal appearance. He is well-developed.  ?HENT:  ?   Head: Normocephalic.  ?   Nose: Nose normal.  ?   Mouth/Throat:  ?   Mouth: Mucous membranes are moist.  ?  Pharynx: Oropharynx is clear.  ?Eyes:  ?   Pupils: Pupils are equal, round, and reactive to light.  ?Neck:  ?   Thyroid: No thyroid mass or thyromegaly.  ?   Vascular: No carotid bruit or JVD.  ?   Trachea: Phonation normal.  ?Cardiovascular:  ?   Rate and Rhythm: Normal rate and regular rhythm.  ?Pulmonary:  ?   Effort: Pulmonary effort is normal. No respiratory distress.  ?   Breath sounds: Normal breath sounds.  ?Abdominal:  ?   General: Bowel sounds are normal.  ?   Palpations: Abdomen is soft.  ?   Tenderness: There is no abdominal tenderness.  ?Musculoskeletal:     ?   General: Normal range of motion.  ?   Cervical back: Normal range of motion and neck supple.  ?Lymphadenopathy:  ?   Cervical: No cervical adenopathy.  ?Skin: ?   General: Skin is warm and dry.  ?Neurological:  ?   Mental Status: He is alert and oriented to person, place, and time.  ?Psychiatric:     ?   Behavior: Behavior normal.     ?   Thought Content: Thought content normal.     ?   Judgment: Judgment normal.  ? ?BP 129/74   Pulse 81   Temp 98.1 ?F (36.7 ?C)   Resp 20   Ht _0  (1.727 m)   Wt 198 lb (89.8 kg)   SpO2 97%   BMI 30.11 kg/m?  ? ? ? ? ?   ?Assessment & Plan:  ? ?INGVALD THEISEN comes in today with chief complaint of Medical Management of Chronic Issues ? ? ?Diagnosis and orders addressed: ? ?1. Essential hypertension ?Low sodium diet ?- CBC with Differential/Platelet ?- CMP14+EGFR ?- cloNIDine (CATAPRES) 0.1 MG tablet; Take 1 tablet (0.1 mg total) by mouth 2 (two) times daily.  Dispense: 180 tablet; Refill: 3 ?- hydrochlorothiazide (HYDRODIURIL) 25 MG tablet; Take 1  tablet (25 mg total) by mouth daily.  Dispense: 90 tablet; Refill: 1 ?- losartan (COZAAR) 100 MG tablet; Take 1 tablet (100 mg total) by mouth daily.  Dispense: 90 tablet; Refill: 1 ?- amLODipine (NORVASC) 10 MG ta

## 2021-12-18 LAB — CMP14+EGFR
ALT: 16 IU/L (ref 0–44)
AST: 16 IU/L (ref 0–40)
Albumin/Globulin Ratio: 1.8 (ref 1.2–2.2)
Albumin: 4.6 g/dL (ref 3.8–4.8)
Alkaline Phosphatase: 52 IU/L (ref 44–121)
BUN/Creatinine Ratio: 15 (ref 10–24)
BUN: 18 mg/dL (ref 8–27)
Bilirubin Total: 0.2 mg/dL (ref 0.0–1.2)
CO2: 25 mmol/L (ref 20–29)
Calcium: 9.8 mg/dL (ref 8.6–10.2)
Chloride: 102 mmol/L (ref 96–106)
Creatinine, Ser: 1.22 mg/dL (ref 0.76–1.27)
Globulin, Total: 2.5 g/dL (ref 1.5–4.5)
Glucose: 95 mg/dL (ref 70–99)
Potassium: 4.5 mmol/L (ref 3.5–5.2)
Sodium: 140 mmol/L (ref 134–144)
Total Protein: 7.1 g/dL (ref 6.0–8.5)
eGFR: 67 mL/min/{1.73_m2} (ref >59)

## 2021-12-18 LAB — CBC WITH DIFFERENTIAL/PLATELET
Basophils Absolute: 0 10*3/uL (ref 0.0–0.2)
Basos: 1 %
EOS (ABSOLUTE): 0.2 10*3/uL (ref 0.0–0.4)
Eos: 3 %
Hematocrit: 46.8 % (ref 37.5–51.0)
Hemoglobin: 15.5 g/dL (ref 13.0–17.7)
Immature Grans (Abs): 0 10*3/uL (ref 0.0–0.1)
Immature Granulocytes: 0 %
Lymphocytes Absolute: 2.7 10*3/uL (ref 0.7–3.1)
Lymphs: 37 %
MCH: 29.1 pg (ref 26.6–33.0)
MCHC: 33.1 g/dL (ref 31.5–35.7)
MCV: 88 fL (ref 79–97)
Monocytes Absolute: 0.5 10*3/uL (ref 0.1–0.9)
Monocytes: 7 %
Neutrophils Absolute: 3.8 10*3/uL (ref 1.4–7.0)
Neutrophils: 52 %
Platelets: 359 10*3/uL (ref 150–450)
RBC: 5.33 x10E6/uL (ref 4.14–5.80)
RDW: 14.5 % (ref 11.6–15.4)
WBC: 7.3 10*3/uL (ref 3.4–10.8)

## 2021-12-18 LAB — PSA, TOTAL AND FREE
PSA, Free Pct: 26.2 %
PSA, Free: 0.34 ng/mL
Prostate Specific Ag, Serum: 1.3 ng/mL (ref 0.0–4.0)

## 2021-12-18 LAB — LIPID PANEL
Chol/HDL Ratio: 3.5 ratio (ref 0.0–5.0)
Cholesterol, Total: 165 mg/dL (ref 100–199)
HDL: 47 mg/dL (ref >39)
LDL Chol Calc (NIH): 97 mg/dL (ref 0–99)
Triglycerides: 114 mg/dL (ref 0–149)
VLDL Cholesterol Cal: 21 mg/dL (ref 5–40)

## 2021-12-21 LAB — TOXASSURE SELECT 13 (MW), URINE

## 2022-02-14 ENCOUNTER — Other Ambulatory Visit: Payer: Self-pay | Admitting: Nurse Practitioner

## 2022-03-26 ENCOUNTER — Other Ambulatory Visit: Payer: Self-pay | Admitting: Nurse Practitioner

## 2022-04-19 ENCOUNTER — Telehealth: Payer: Self-pay

## 2022-04-19 NOTE — Chronic Care Management (AMB) (Signed)
  Care Coordination   Note   04/19/2022 Name: Eric Saunders MRN: [id number] DOB: 23-Sep-1955  Eric Saunders is a 66 y.o. year old male who sees [name], [place] for primary care. I reached out to [name] by phone today to offer care coordination services.  [name] was given information about Care Coordination services today including:   The Care Coordination services include support from the care team which includes your Nurse Coordinator, Clinical Social Worker, or Pharmacist.  The Care Coordination team is here to help remove barriers to the health concerns and goals most important to you. Care Coordination services are voluntary, and the patient may decline or stop services at any time by request to their care team member.   Care Coordination Consent Status: Patient agreed to services and verbal consent obtained.   Follow up plan:  Telephone appointment with care coordination team member scheduled for:  04/23/2022  Encounter Outcome:  Pt. Scheduled   Noreene Larsson, [place], [place] [address] Direct Dial: [phone] [email or web address]

## 2022-04-23 ENCOUNTER — Encounter: Payer: Self-pay | Admitting: *Deleted

## 2022-04-23 ENCOUNTER — Ambulatory Visit: Payer: Self-pay | Admitting: *Deleted

## 2022-04-23 NOTE — Patient Outreach (Signed)
  Care Coordination   04/23/2022 Name: Eric Saunders MRN: [id number] DOB: April 04, 1956   Care Coordination Outreach Attempts:  An unsuccessful telephone outreach was attempted today to offer the patient information about available care coordination services as a benefit of their health plan.   Follow Up Plan:  Additional outreach attempts will be made to offer the patient care coordination information and services.   Encounter Outcome:  No Answer  Care Coordination Interventions Activated:  No   Care Coordination Interventions:  No, not indicated    SIG Jhaden Pizzuto L. Lavina Hamman, RN, BSN, [place] Coordinator Office number [phone]

## 2022-04-23 NOTE — Patient Instructions (Addendum)
Visit Information  Thank you for taking time to visit with me today. Please don't hesitate to contact me if I can be of assistance to you.   Following are the goals we discussed today:   Goals Addressed               This Visit's Progress     Patient Stated     Make and Keep medical appointments [facility]) (pt-stated)   Not on track     Care Coordination Interventions: Discussed plans with patient for ongoing care management follow up and provided patient with direct contact information for care management team Screening for signs and symptoms of depression related to chronic disease state  Assessed social determinant of health barriers Discussed the importance of attending medical appointments. Encouraged to attend missed appointments Online education sent via my chart for living with COPD, COPD action plan, Hypertension      Pain management (pt-stated)        Started 04/23/22  Care Coordination Interventions: Reviewed provider established plan for pain management Discussed importance of adherence to all scheduled medical appointments Counseled on the importance of reporting any/all new or changed pain symptoms or management strategies to pain management provider Reviewed with patient prescribed pharmacological and nonpharmacological pain relief strategies Screening for signs and symptoms of depression related to chronic disease state  Assessed social determinant of health barriers Online education sent via my chart for managing chronic pain (video) depression and anxiety (video)        Our next appointment is by telephone on 05/23/22 at 10 am  Please call the care guide team at [phone] if you need to cancel or reschedule your appointment.   If you are experiencing a Mental Health or [place] or need someone to talk to, please call the Suicide and Crisis Lifeline: 988 call the [place] National Suicide Prevention Lifeline: 914-496-7297 or TTY: 435-624-2013 TTY  216-831-2464) to talk to a trained counselor call 1-800-273-TALK (toll free, 24 hour hotline) call the [facility]: ([phone] call 911   Patient verbalizes understanding of instructions and care plan provided today and agrees to view in [place]. Active MyChart status and patient understanding of how to access instructions and care plan via MyChart confirmed with patient.     The care management team will reach out to the patient again over the next 30 business days.   Liv Rallis L. Lavina Hamman, RN, BSN, [place] Coordinator Office number [phone]

## 2022-04-23 NOTE — Patient Outreach (Signed)
  Care Coordination   Initial Visit Note   04/23/2022 Name: Eric Saunders MRN: [id number] DOB: July 19, 1956  Eric Saunders is a 66 y.o. year old male who sees [name], [place] for primary care. I spoke with  [name] by phone today  What matters to the patients health and wellness today?   Pain of neck and back (manageable pain level of 5 and below) Making and keeping medical appointments He confirms missing various medical appointments to include a 6 month orthopedic as he has been providing care for his disabled wife  Managing Moments of anxiety/depression    Goals Addressed               This Visit's Progress     Patient Stated     Make and Keep medical appointments [facility]) (pt-stated)   Not on track     Care Coordination Interventions: Discussed plans with patient for ongoing care management follow up and provided patient with direct contact information for care management team Screening for signs and symptoms of depression related to chronic disease state  Assessed social determinant of health barriers Discussed the importance of attending medical appointments. Encouraged to attend missed appointments Online education sent via my chart for living with COPD, COPD action plan, Hypertension      Pain management (pt-stated)        Started 04/23/22  Care Coordination Interventions: Reviewed provider established plan for pain management Discussed importance of adherence to all scheduled medical appointments Counseled on the importance of reporting any/all new or changed pain symptoms or management strategies to pain management provider Reviewed with patient prescribed pharmacological and nonpharmacological pain relief strategies Screening for signs and symptoms of depression related to chronic disease state  Assessed social determinant of health barriers Online education sent via my chart for managing chronic pain (video) depression and anxiety (video)         SDOH assessments and interventions completed:  Yes  SDOH Interventions Today    Flowsheet Row Most Recent Value  SDOH Interventions   Food Insecurity Interventions Intervention Not Indicated  Stress Interventions Intervention Not Indicated  Social Connections Interventions Intervention Not Indicated  Transportation Interventions Intervention Not Indicated        Care Coordination Interventions Activated:  Yes  Care Coordination Interventions:  Yes, provided   Follow up plan: Follow up call scheduled for 05/23/22 1000    Encounter Outcome:  Pt. Visit Completed   Idaliz Tinkle L. Lavina Hamman, RN, BSN, [place] Coordinator Office number [phone]

## 2022-05-23 ENCOUNTER — Ambulatory Visit: Payer: Self-pay | Admitting: *Deleted

## 2022-05-23 NOTE — Patient Outreach (Signed)
  Care Coordination   05/23/2022 Name: Eric Saunders MRN: [id number] DOB: 14-Mar-1956   Care Coordination Outreach Attempts:  An unsuccessful telephone outreach was attempted today to offer the patient information about available care coordination services as a benefit of their health plan.   Follow Up Plan:  Additional outreach attempts will be made to offer the patient care coordination information and services.   Encounter Outcome:  No Answer  Care Coordination Interventions Activated:  No   Care Coordination Interventions:  No, not indicated    SIG Labella Zahradnik L. Lavina Hamman, RN, BSN, [place] Coordinator Office number [phone]

## 2022-05-24 ENCOUNTER — Other Ambulatory Visit: Payer: Self-pay | Admitting: Nurse Practitioner

## 2022-05-24 MED ORDER — OMEPRAZOLE 40 MG PO CPDR: 40.0000 mg | DELAYED_RELEASE_CAPSULE | Freq: Two times a day (BID) | ORAL | 1 refills | Status: DC

## 2022-05-31 ENCOUNTER — Ambulatory Visit: Payer: Self-pay | Admitting: *Deleted

## 2022-05-31 NOTE — Patient Outreach (Addendum)
  Care Coordination   Follow Up Visit Note   05/31/2022 Name: Eric Saunders MRN: [id number] DOB: 12-Nov-1955  Eric Saunders is a 66 y.o. year old male who sees Eric Saunders, [place] for primary care. I  spoke with his wife Eric Saunders today   What matters to the patients health and wellness today?  Continues to do well. No concerns voiced     Goals Addressed               This Visit's Progress     Patient Stated     Make and Keep medical appointments [facility]) (pt-stated)   On track     Care Coordination Interventions: Discussed plans with patient for ongoing care management follow up and provided patient with direct contact information for care management team Screening for signs and symptoms of depression related to chronic disease state  Assessed social determinant of health barriers Confirmed he is scheduled for a pcp visit 10/16//23  Sent education via my chart on gout and Gastric esophageal reflux disease      Pain management [facility]) (pt-stated)        Started 04/23/22  Care Coordination Interventions: Reviewed provider established plan for pain management Discussed importance of adherence to all scheduled medical appointments Counseled on the importance of reporting any/all new or changed pain symptoms or management strategies to pain management provider Reviewed with patient prescribed pharmacological and nonpharmacological pain relief strategies Screening for signs and symptoms of depression related to chronic disease state  Assessed social determinant of health barriers Online education sent via my chart for managing chronic pain (video) depression and anxiety (video)        SDOH assessments and interventions completed:  No     Care Coordination Interventions Activated:  Yes  Care Coordination Interventions:  Yes, provided   Follow up plan: Follow up call scheduled for 07/02/22 2 pm    Encounter Outcome:  Pt. Visit Completed   Sadiya Durand L. Lavina Hamman, RN, BSN,  [place] Coordinator Office number [phone]

## 2022-05-31 NOTE — Patient Instructions (Addendum)
Visit Information  Thank you for taking time to visit with me today. Please don't hesitate to contact me if I can be of assistance to you.   Following are the goals we discussed today:   Goals Addressed               This Visit's Progress     Patient Stated     Make and Keep medical appointments [facility]) (pt-stated)   On track     Care Coordination Interventions: Discussed plans with patient for ongoing care management follow up and provided patient with direct contact information for care management team Screening for signs and symptoms of depression related to chronic disease state  Assessed social determinant of health barriers Confirmed he is scheduled for a pcp visit 10/16//23  Sent education via my chart on gout and Gastric esophageal reflux disease      Pain management [facility]) (pt-stated)        Started 04/23/22  Care Coordination Interventions: Reviewed provider established plan for pain management Discussed importance of adherence to all scheduled medical appointments Counseled on the importance of reporting any/all new or changed pain symptoms or management strategies to pain management provider Reviewed with patient prescribed pharmacological and nonpharmacological pain relief strategies Screening for signs and symptoms of depression related to chronic disease state  Assessed social determinant of health barriers Online education sent via my chart for managing chronic pain (video) depression and anxiety (video)        Our next appointment is by telephone on 07/02/22 at 2 pm  Please call the care guide team at [phone] if you need to cancel or reschedule your appointment.   If you are experiencing a Mental Health or [place] or need someone to talk to, please call the Suicide and Crisis Lifeline: 988 call the [place] National Suicide Prevention Lifeline: 480-175-6651 or TTY: (352)610-2968 TTY (782)782-5508) to talk to a trained counselor call  1-800-273-TALK (toll free, 24 hour hotline) call the [facility]: [phone] call 911   Patient verbalizes understanding of instructions and care plan provided today and agrees to view in [place]. Active MyChart status and patient understanding of how to access instructions and care plan via MyChart confirmed with patient.     The patient has been provided with contact information for the care management team and has been advised to call with any health related questions or concerns.   [place] Lavina Hamman, RN, BSN, [place] Coordinator Office number [phone]

## 2022-06-19 ENCOUNTER — Other Ambulatory Visit: Payer: Self-pay

## 2022-06-19 DIAGNOSIS — Z122 Encounter for screening for malignant neoplasm of respiratory organs: Secondary | ICD-10-CM

## 2022-06-19 DIAGNOSIS — Z87891 Personal history of nicotine dependence: Secondary | ICD-10-CM

## 2022-06-19 NOTE — Progress Notes (Signed)
Order placed for LDCT per protocol. Patient scheduled for 08/06/22 at [facility]. Patient aware.

## 2022-06-21 ENCOUNTER — Other Ambulatory Visit: Payer: Self-pay | Admitting: Nurse Practitioner

## 2022-06-21 DIAGNOSIS — I10 Essential (primary) hypertension: Secondary | ICD-10-CM

## 2022-07-01 ENCOUNTER — Ambulatory Visit: Payer: BC Managed Care – PPO | Admitting: Nurse Practitioner

## 2022-07-01 ENCOUNTER — Encounter: Payer: Self-pay | Admitting: Nurse Practitioner

## 2022-07-01 VITALS — BP 135/81 | HR 79 | Temp 97.8°F | Resp 20 | Ht 68.0 in | Wt 193.0 lb

## 2022-07-01 DIAGNOSIS — R09A2 Foreign body sensation, throat: Secondary | ICD-10-CM

## 2022-07-01 DIAGNOSIS — I2510 Atherosclerotic heart disease of native coronary artery without angina pectoris: Secondary | ICD-10-CM | POA: Diagnosis not present

## 2022-07-01 DIAGNOSIS — E669 Obesity, unspecified: Secondary | ICD-10-CM

## 2022-07-01 DIAGNOSIS — Z720 Tobacco use: Secondary | ICD-10-CM

## 2022-07-01 DIAGNOSIS — K219 Gastro-esophageal reflux disease without esophagitis: Secondary | ICD-10-CM | POA: Diagnosis not present

## 2022-07-01 DIAGNOSIS — F411 Generalized anxiety disorder: Secondary | ICD-10-CM

## 2022-07-01 DIAGNOSIS — J449 Chronic obstructive pulmonary disease, unspecified: Secondary | ICD-10-CM | POA: Diagnosis not present

## 2022-07-01 DIAGNOSIS — I25810 Atherosclerosis of coronary artery bypass graft(s) without angina pectoris: Secondary | ICD-10-CM

## 2022-07-01 DIAGNOSIS — E66811 Obesity, class 1: Secondary | ICD-10-CM

## 2022-07-01 DIAGNOSIS — I10 Essential (primary) hypertension: Secondary | ICD-10-CM

## 2022-07-01 DIAGNOSIS — E785 Hyperlipidemia, unspecified: Secondary | ICD-10-CM

## 2022-07-01 MED ORDER — OMEPRAZOLE 40 MG PO CPDR: 40.0000 mg | DELAYED_RELEASE_CAPSULE | Freq: Two times a day (BID) | ORAL | 1 refills | Status: DC

## 2022-07-01 MED ORDER — IBUPROFEN-FAMOTIDINE 800-26.6 MG PO TABS: 1.0000 | ORAL_TABLET | Freq: Three times a day (TID) | ORAL | 3 refills | Status: DC | PRN

## 2022-07-01 MED ORDER — METOPROLOL SUCCINATE ER 25 MG PO TB24: 25.0000 mg | ORAL_TABLET | Freq: Every day | ORAL | 1 refills | Status: DC

## 2022-07-01 MED ORDER — AMLODIPINE BESYLATE 10 MG PO TABS: 10.0000 mg | ORAL_TABLET | Freq: Every day | ORAL | 1 refills | Status: DC

## 2022-07-01 MED ORDER — PRAVASTATIN SODIUM 80 MG PO TABS: 40.0000 mg | ORAL_TABLET | Freq: Every day | ORAL | 1 refills | Status: DC

## 2022-07-01 MED ORDER — CYCLOBENZAPRINE HCL 10 MG PO TABS: 10.0000 mg | ORAL_TABLET | Freq: Two times a day (BID) | ORAL | 5 refills | Status: DC | PRN

## 2022-07-01 MED ORDER — HYDROCHLOROTHIAZIDE 25 MG PO TABS: 25.0000 mg | ORAL_TABLET | Freq: Every day | ORAL | 1 refills | Status: DC

## 2022-07-01 MED ORDER — ROSUVASTATIN CALCIUM 40 MG PO TABS: 40.0000 mg | ORAL_TABLET | Freq: Every day | ORAL | 1 refills | Status: DC

## 2022-07-01 MED ORDER — BENZONATATE 100 MG PO CAPS: ORAL_CAPSULE | ORAL | 0 refills | Status: AC

## 2022-07-01 MED ORDER — CLONIDINE HCL 0.1 MG PO TABS: 0.1000 mg | ORAL_TABLET | Freq: Two times a day (BID) | ORAL | 1 refills | Status: DC

## 2022-07-01 MED ORDER — LOSARTAN POTASSIUM 100 MG PO TABS: 100.0000 mg | ORAL_TABLET | Freq: Every day | ORAL | 1 refills | Status: DC

## 2022-07-01 MED ORDER — ALPRAZOLAM 0.5 MG PO TABS: 0.5000 mg | ORAL_TABLET | Freq: Two times a day (BID) | ORAL | 2 refills | Status: DC | PRN

## 2022-07-01 MED ORDER — FENOFIBRATE 160 MG PO TABS: 160.0000 mg | ORAL_TABLET | Freq: Every day | ORAL | 1 refills | Status: DC

## 2022-07-01 MED ORDER — ESCITALOPRAM OXALATE 20 MG PO TABS: 40.0000 mg | ORAL_TABLET | Freq: Every day | ORAL | 1 refills | Status: DC

## 2022-07-01 NOTE — Addendum Note (Signed)
Addended by: [name] on: 07/01/2022 09:10 AM   Modules accepted: Orders

## 2022-07-01 NOTE — Progress Notes (Signed)
Subjective:    Patient ID: Eric Saunders, male    DOB: 12-10-1955, 66 y.o.   MRN: [id number]   Chief Complaint: medical management of chronic issues     HPI:  Eric Saunders is a 66 y.o. who identifies as a male who was assigned male at birth.   Social history: Lives with: wife Work history: disability- takes care of his wife   Comes in today for follow up of the following chronic medical issues:  1. Essential hypertension No c/o chest pain, sob or headache. Does not check blood pressure at home. BP Readings from Last 3 Encounters:  12/17/21 129/74  11/20/21 107/68  11/14/21 110/64     2. Coronary arteriosclerosis Last saw cardiology on 11/14/21. Review of office note, no changes were made to plan of care.  3. Chronic obstructive pulmonary disease, unspecified COPD type ([place]) Is on no inhalers and is still smoking.  4. Gastroesophageal reflux disease without esophagitis Is on prilosec and is doing well.   5. Globus sensation Still has coughing episodes but is mainly when he is eating. Does not feel like he is choking.  6. Mixed hyperlipidemia Does not watch diet and does no dedicated exercise. Lab Results  Component Value Date   CHOL 165 12/17/2021   HDL 47 12/17/2021   LDLCALC 97 12/17/2021   TRIG 114 12/17/2021   CHOLHDL 3.5 12/17/2021     7. Tobacco user Smokes over a pack a day  8. Class 1 obesity Weight is down 5lbs Wt Readings from Last 3 Encounters:  07/01/22 193 lb (87.5 kg)  12/17/21 198 lb (89.8 kg)  11/20/21 191 lb (86.6 kg)   BMI Readings from Last 3 Encounters:  07/01/22 29.35 kg/m  12/17/21 30.11 kg/m  11/20/21 29.04 kg/m     New complaints: None today  Allergies  Allergen Reactions   Ace Inhibitors Cough   Outpatient Encounter Medications as of 07/01/2022  Medication Sig   ALPRAZolam (XANAX) 0.5 MG tablet Take 1 tablet (0.5 mg total) by mouth 2 (two) times daily as needed for anxiety.   ALPRAZolam (XANAX) 0.5 MG tablet  Take 1 tablet by mouth 2 (two) times daily as needed.   amLODipine (NORVASC) 10 MG tablet Take 1 tablet by mouth daily.   amLODipine (NORVASC) 10 MG tablet TAKE 1 TABLET BY MOUTH EVERY DAY   aspirin 81 MG tablet Take 81 mg by mouth daily.   benzonatate (TESSALON) 100 MG capsule TAKE 1 CAPSULE BY MOUTH THREE TIMES A DAY AS NEEDED FOR COUGH   cloNIDine (CATAPRES) 0.1 MG tablet Take 1 tablet (0.1 mg total) by mouth 2 (two) times daily.   cyclobenzaprine (FLEXERIL) 10 MG tablet Take 1 tablet (10 mg total) by mouth 2 (two) times daily as needed.   diclofenac Sodium (PENNSAID) 2 % SOLN APPLY 2 PUMPS (40 MG) TO THE AFFECTED AREA BY TOPICAL ROUTE 2 TIMES PER DAY   escitalopram (LEXAPRO) 10 MG tablet Take 1 tablet by mouth daily.   escitalopram (LEXAPRO) 20 MG tablet Take 2 tablets (40 mg total) by mouth daily.   fenofibrate 160 MG tablet Take 1 tablet (160 mg total) by mouth daily.   fenofibrate 160 MG tablet Take 1 tablet by mouth daily.   hydrochlorothiazide (HYDRODIURIL) 25 MG tablet Take 1 tablet (25 mg total) by mouth daily.   hydrochlorothiazide (HYDRODIURIL) 25 MG tablet Take 1 tablet by mouth daily.   ibuprofen (ADVIL) 600 MG tablet Take 1 tablet (600 mg total) by mouth  every 6 (six) hours as needed.   ibuprofen (ADVIL) 800 MG tablet TAKE 1 TABLET BY MOUTH EVERY 8 HOURS AS NEEDED FOR MILD TO MODERATE PAIN.   losartan (COZAAR) 100 MG tablet Take 1 tablet by mouth daily.   losartan (COZAAR) 100 MG tablet TAKE 1 TABLET BY MOUTH EVERY DAY   metoprolol succinate (TOPROL-XL) 25 MG 24 hr tablet Take 1 tablet (25 mg total) by mouth daily.   metoprolol succinate (TOPROL-XL) 25 MG 24 hr tablet Take 1 tablet by mouth daily.   omeprazole (PRILOSEC) 40 MG capsule Take 1 capsule (40 mg total) by mouth 2 (two) times daily.   pravastatin (PRAVACHOL) 80 MG tablet Take 0.5 tablets by mouth daily.   rosuvastatin (CRESTOR) 20 MG tablet Take 1 tablet by mouth daily.   rosuvastatin (CRESTOR) 40 MG tablet Take 1  tablet (40 mg total) by mouth daily.   sildenafil (VIAGRA) 100 MG tablet 0 mg by oral route.   Sodium Sulfate-Mag Sulfate-KCl (SUTAB) [phone] MG TABS TAKE 24 TABLETS BY MOUTH AS DIRECTED.   triamcinolone cream (KENALOG) 0.1 % Apply 1 application topically 2 (two) times daily.   No facility-administered encounter medications on file as of 07/01/2022.    Past Surgical History:  Procedure Laterality Date   COLONOSCOPY     CORONARY ARTERY BYPASS GRAFT N/A 06/17/2014   Procedure: CORONARY ARTERY BYPASS GRAFTING (CABG) X 4, RIGHT LEG EVH;  Surgeon: Gaye Pollack, MD;  Location: [facility];  Service: Open Heart Surgery;  Laterality: N/A;   KNEE SURGERY     bilateral arthroscopic   LEFT HEART CATHETERIZATION WITH CORONARY ANGIOGRAM N/A 06/16/2014   Procedure: LEFT HEART CATHETERIZATION WITH CORONARY ANGIOGRAM;  Surgeon: Burnell Blanks, MD;  Location: [facility] CATH LAB;  Service: Cardiovascular;  Laterality: N/A;   TEE WITHOUT CARDIOVERSION N/A 06/17/2014   Procedure: TRANSESOPHAGEAL ECHOCARDIOGRAM (TEE);  Surgeon: Gaye Pollack, MD;  Location: [facility];  Service: Open Heart Surgery;  Laterality: N/A;   UPPER GASTROINTESTINAL ENDOSCOPY      Family History  Problem Relation Age of Onset   Drug abuse Sister    Healthy Brother    Diabetes Sister    Healthy Sister    Healthy Sister    Healthy Sister    Healthy Sister    Healthy Brother    Colon cancer Neg Hx    Esophageal cancer Neg Hx    Inflammatory bowel disease Neg Hx    Liver disease Neg Hx    Pancreatic cancer Neg Hx    Rectal cancer Neg Hx    Stomach cancer Neg Hx       Controlled substance contract: n/a     Review of Systems  Constitutional:  Negative for diaphoresis.  Eyes:  Negative for pain.  Respiratory:  Negative for shortness of breath.   Cardiovascular:  Negative for chest pain, palpitations and leg swelling.  Gastrointestinal:  Negative for abdominal pain.  Endocrine: Negative for polydipsia.  Skin:  Negative  for rash.  Neurological:  Negative for dizziness, weakness and headaches.  Hematological:  Does not bruise/bleed easily.  All other systems reviewed and are negative.      Objective:   Physical Exam Vitals and nursing note reviewed.  Constitutional:      Appearance: Normal appearance. He is well-developed.  HENT:     Head: Normocephalic.     Nose: Nose normal.     Mouth/Throat:     Mouth: Mucous membranes are moist.     Pharynx: Oropharynx  is clear.  Eyes:     Pupils: Pupils are equal, round, and reactive to light.  Neck:     Thyroid: No thyroid mass or thyromegaly.     Vascular: No carotid bruit or JVD.     Trachea: Phonation normal.  Cardiovascular:     Rate and Rhythm: Normal rate and regular rhythm.  Pulmonary:     Effort: Pulmonary effort is normal. No respiratory distress.     Breath sounds: Normal breath sounds.  Abdominal:     General: Bowel sounds are normal.     Palpations: Abdomen is soft.     Tenderness: There is no abdominal tenderness.  Musculoskeletal:        General: Normal range of motion.     Cervical back: Normal range of motion and neck supple.  Lymphadenopathy:     Cervical: No cervical adenopathy.  Skin:    General: Skin is warm and dry.  Neurological:     Mental Status: He is alert and oriented to person, place, and time.  Psychiatric:        Behavior: Behavior normal.        Thought Content: Thought content normal.        Judgment: Judgment normal.    BP 135/81   Pulse 79   Temp 97.8 F (36.6 C) (Temporal)   Resp 20   Ht '5\' 8"'  (1.727 m)   Wt 193 lb (87.5 kg)   SpO2 97%   BMI 29.35 kg/m         Assessment & Plan:   Eric Saunders comes in today with chief complaint of Medical Management of Chronic Issues (Cough and congestion/Bone sticking up in let wrist)   Diagnosis and orders addressed:  1. Essential hypertension Low sodium diet - CBC with Differential/Platelet - CMP14+EGFR - losartan (COZAAR) 100 MG tablet; Take 1  tablet (100 mg total) by mouth daily.  Dispense: 90 tablet; Refill: 1 - amLODipine (NORVASC) 10 MG tablet; Take 1 tablet (10 mg total) by mouth daily.  Dispense: 90 tablet; Refill: 1 - hydrochlorothiazide (HYDRODIURIL) 25 MG tablet; Take 1 tablet (25 mg total) by mouth daily.  Dispense: 90 tablet; Refill: 1 - cloNIDine (CATAPRES) 0.1 MG tablet; Take 1 tablet (0.1 mg total) by mouth 2 (two) times daily.  Dispense: 180 tablet; Refill: 1  2. Coronary arteriosclerosis Keep really follow  up with cardiologist  3. Chronic obstructive pulmonary disease, unspecified COPD type ([place]) Smoking cessation  4. Gastroesophageal reflux disease without esophagitis Avoid spicy foods Do not eat 2 hours prior to bedtime - omeprazole (PRILOSEC) 40 MG capsule; Take 1 capsule (40 mg total) by mouth 2 (two) times daily.  Dispense: 180 capsule; Refill: 1  5. Globus sensation Chew food well.  6. Hyperlipidemia with target LDL less than 100 Low fat diet - Lipid panel - rosuvastatin (CRESTOR) 40 MG tablet; Take 1 tablet (40 mg total) by mouth daily.  Dispense: 90 tablet; Refill: 1 - fenofibrate 160 MG tablet; Take 1 tablet (160 mg total) by mouth daily.  Dispense: 90 tablet; Refill: 1 - pravastatin (PRAVACHOL) 80 MG tablet; Take 0.5 tablets (40 mg total) by mouth daily.  Dispense: 90 tablet; Refill: 1  7. Tobacco user Smoking cessation  8. Class 1 obesity Discussed diet and exercise for person with BMI >25 Will recheck weight in 3-6 months   9. Coronary artery disease involving autologous vein coronary bypass graft without angina pectoris - metoprolol succinate (TOPROL-XL) 25 MG 24 hr tablet; Take 1  tablet (25 mg total) by mouth daily.  Dispense: 90 tablet; Refill: 1  10. GAD (generalized anxiety disorder) Stress management - escitalopram (LEXAPRO) 20 MG tablet; Take 2 tablets (40 mg total) by mouth daily.  Dispense: 180 tablet; Refill: 1 - ALPRAZolam (XANAX) 0.5 MG tablet; Take 1 tablet (0.5 mg total) by  mouth 2 (two) times daily as needed for anxiety.  Dispense: 60 tablet; Refill: 2   Labs pending Health Maintenance reviewed Diet and exercise encouraged  Follow up plan: 6 months   Mary-Margaret Hassell Done, FNP

## 2022-07-01 NOTE — Patient Instructions (Signed)

## 2022-07-02 ENCOUNTER — Ambulatory Visit: Payer: Self-pay | Admitting: *Deleted

## 2022-07-02 LAB — CBC WITH DIFFERENTIAL/PLATELET
Basophils Absolute: 0.1 10*3/uL (ref 0.0–0.2)
Basos: 0 %
EOS (ABSOLUTE): 0.2 10*3/uL (ref 0.0–0.4)
Eos: 2 %
Hematocrit: 47.4 % (ref 37.5–51.0)
Hemoglobin: 15.4 g/dL (ref 13.0–17.7)
Immature Grans (Abs): 0 10*3/uL (ref 0.0–0.1)
Immature Granulocytes: 0 %
Lymphocytes Absolute: 2.5 10*3/uL (ref 0.7–3.1)
Lymphs: 22 %
MCH: 29.3 pg (ref 26.6–33.0)
MCHC: 32.5 g/dL (ref 31.5–35.7)
MCV: 90 fL (ref 79–97)
Monocytes Absolute: 0.7 10*3/uL (ref 0.1–0.9)
Monocytes: 6 %
Neutrophils Absolute: 7.9 10*3/uL — ABNORMAL HIGH (ref 1.4–7.0)
Neutrophils: 70 %
Platelets: 355 10*3/uL (ref 150–450)
RBC: 5.26 x10E6/uL (ref 4.14–5.80)
RDW: 13.6 % (ref 11.6–15.4)
WBC: 11.4 10*3/uL — ABNORMAL HIGH (ref 3.4–10.8)

## 2022-07-02 LAB — CMP14+EGFR
ALT: 13 IU/L (ref 0–44)
AST: 17 IU/L (ref 0–40)
Albumin/Globulin Ratio: 1.9 (ref 1.2–2.2)
Albumin: 4.9 g/dL (ref 3.9–4.9)
Alkaline Phosphatase: 59 IU/L (ref 44–121)
BUN/Creatinine Ratio: 16 (ref 10–24)
BUN: 16 mg/dL (ref 8–27)
Bilirubin Total: <0.2 mg/dL (ref 0.0–1.2)
CO2: 24 mmol/L (ref 20–29)
Calcium: 9.8 mg/dL (ref 8.6–10.2)
Chloride: 99 mmol/L (ref 96–106)
Creatinine, Ser: 1.02 mg/dL (ref 0.76–1.27)
Globulin, Total: 2.6 g/dL (ref 1.5–4.5)
Glucose: 101 mg/dL — ABNORMAL HIGH (ref 70–99)
Potassium: 4.4 mmol/L (ref 3.5–5.2)
Sodium: 138 mmol/L (ref 134–144)
Total Protein: 7.5 g/dL (ref 6.0–8.5)
eGFR: 82 mL/min/{1.73_m2} (ref >59)

## 2022-07-02 LAB — LIPID PANEL
Chol/HDL Ratio: 3.1 ratio (ref 0.0–5.0)
Cholesterol, Total: 157 mg/dL (ref 100–199)
HDL: 50 mg/dL (ref >39)
LDL Chol Calc (NIH): 90 mg/dL (ref 0–99)
Triglycerides: 90 mg/dL (ref 0–149)
VLDL Cholesterol Cal: 17 mg/dL (ref 5–40)

## 2022-07-02 NOTE — Patient Outreach (Signed)
Care Coordination   Follow Up Visit Note   07/02/2022 Name: ISIDORE MARGRAF MRN: [id number] DOB: 11/16/1955  DARRELLE WIBERG is a 66 y.o. year old male who sees [name], [place] for primary care. I spoke with  [name] by phone today.  What matters to the patients health and wellness today?  Keeping active,  overall reports he still has back and neck pain, He confirmed he has not called to [facility] yet to schedule an appointment. He informs RN CM he prefers to make the appointment (when RN CM offered to assist)  He is still prioritizing his wife's [name]) care first. She recently fractured her hip  He inquired about his labs completed on 07/02/22 at his primary care provider (PCP) office He asked questions about the elevation of the Wbc (11.4 ) and neutrophils absolute (7.9)  After discussion of the values, he confirms he does have upper respiratory infection symptoms He confirms his pcp encouraged him to return a call if his symptoms did not improve. He requested RN CM to ask his pcp for a prescription of the pcp's choice to be [facility] him as he did not feel he was getting better. He shared the importance of him staying well to assist in his wife's care  His voice is raspy today, audible congestion, aches of his lower back plus reports productive hacking cough of yellow to green secretions     Goals Addressed               This Visit's Progress     Patient Stated     Make and Keep medical appointments [facility]) (pt-stated)   On track     Care Coordination Interventions: Discussed plans with patient for ongoing care management follow up and provided patient with direct contact information for care management team Screening for signs and symptoms of depression related to chronic disease state  Assessed social determinant of health barriers Confirmed he attended his scheduled pcp visit 10/16//23 but has not called [facility] to schedule  an appointment yet. Offered to assist. He prefers to call himself Education completed/questions answered about Wbc/neutrophils absolute, upper respiratory infection Outreached to his pcp as he requested to ask for a prescription of the pcp's choice to [facility].       Pain management [facility]) (pt-stated)        Started 04/23/22  Care Coordination Interventions: Reviewed provider established plan for pain management Discussed importance of adherence to all scheduled medical appointments Counseled on the importance of reporting any/all new or changed pain symptoms or management strategies to pain management provider Discussed use of relaxation techniques and/or diversional activities to assist with pain reduction (distraction, imagery, relaxation, massage, acupressure, TENS, heat, and cold application Assessed for changes in chronic pain status        SDOH assessments and interventions completed:  Yes  SDOH Interventions Today    Flowsheet Row Most Recent Value  SDOH Interventions   Food Insecurity Interventions Intervention Not Indicated  Housing Interventions Intervention Not Indicated  Transportation Interventions Intervention Not Indicated  Utilities Interventions Intervention Not Indicated  Financial Strain Interventions Intervention Not Indicated  Physical Activity Interventions Intervention Not Indicated  Stress Interventions Intervention Not Indicated        Care Coordination Interventions Activated:  Yes  Care Coordination Interventions:  Yes, provided   Follow up plan: Follow up call scheduled for 08/02/22    Encounter Outcome:  Pt. Visit Completed   Carlee Tesfaye L. Lavina Hamman, RN, BSN, [place] Coordinator Office number [phone]

## 2022-07-02 NOTE — Patient Instructions (Addendum)
Visit Information  Thank you for taking time to visit with me today. Please don't hesitate to contact me if I can be of assistance to you.   Following are the goals we discussed today:   Goals Addressed               This Visit's Progress     Patient Stated     Make and Keep medical appointments [facility]) (pt-stated)   On track     Care Coordination Interventions: Discussed plans with patient for ongoing care management follow up and provided patient with direct contact information for care management team Screening for signs and symptoms of depression related to chronic disease state  Assessed social determinant of health barriers Confirmed he attended his scheduled pcp visit 10/16//23 but has not called [facility] to schedule an appointment yet. Offered to assist. He prefers to call himself Education completed/questions answered about Wbc/neutrophils absolute, upper respiratory infection Outreached to his pcp as he requested to ask for a prescription of the pcp's choice to [facility].       Pain management [facility]) (pt-stated)        Started 04/23/22  Care Coordination Interventions: Reviewed provider established plan for pain management Discussed importance of adherence to all scheduled medical appointments Counseled on the importance of reporting any/all new or changed pain symptoms or management strategies to pain management provider Discussed use of relaxation techniques and/or diversional activities to assist with pain reduction (distraction, imagery, relaxation, massage, acupressure, TENS, heat, and cold application Assessed for changes in chronic pain status        Our next appointment is by telephone on 08/02/22 at 1 pm  Please call the care guide team at [phone] if you need to cancel or reschedule your appointment.   If you are experiencing a Mental Health or [place] or need someone to talk to, please call the Suicide and  Crisis Lifeline: 988 call the [place] National Suicide Prevention Lifeline: 608-062-3387 or TTY: (418)063-1501 TTY 2390221403) to talk to a trained counselor call 1-800-273-TALK (toll free, 24 hour hotline) call the [facility]: (618)791-2365 call 911   Patient verbalizes understanding of instructions and care plan provided today and agrees to view in [place]. Active MyChart status and patient understanding of how to access instructions and care plan via MyChart confirmed with patient.     The patient has been provided with contact information for the care management team and has been advised to call with any health related questions or concerns.   Amandeep Nesmith L. Lavina Hamman, RN, BSN, [place] Coordinator Office number [phone]

## 2022-07-03 ENCOUNTER — Other Ambulatory Visit: Payer: Self-pay | Admitting: Nurse Practitioner

## 2022-07-03 MED ORDER — AZITHROMYCIN 250 MG PO TABS: ORAL_TABLET | ORAL | 0 refills | Status: DC

## 2022-08-02 ENCOUNTER — Ambulatory Visit: Payer: Self-pay | Admitting: *Deleted

## 2022-08-02 NOTE — Patient Instructions (Addendum)
Visit Information  Thank you for taking time to visit with me today. Please don't hesitate to contact me if I can be of assistance to you.   Following are the goals we discussed today:   Goals Addressed               This Visit's Progress     Patient Stated     Make and Keep medical appointments [facility]) (pt-stated)   On track     Care Coordination Interventions: Discussed plans with patient for ongoing care management follow up and provided patient with direct contact information for care management team Screening for signs and symptoms of depression related to chronic disease state  Assessed social determinant of health barriers Confirmed he attended his scheduled pcp visit 10/16//23 but still has not called [facility] to schedule an appointment yet. He prefers to call himself        Pain management [facility]) (pt-stated)   On track     Started 04/23/22  Care Coordination Interventions: Reviewed provider established plan for pain management Discussed importance of adherence to all scheduled medical appointments Counseled on the importance of reporting any/all new or changed pain symptoms or management strategies to pain management provider Discussed use of relaxation techniques and/or diversional activities to assist with pain reduction (distraction, imagery, relaxation, massage, acupressure, TENS, heat, and cold application Assessed for changes in chronic pain status        Our next appointment is by telephone on 08/30/22 at 2:30 pm  Please call the care guide team at [phone] if you need to cancel or reschedule your appointment.   If you are experiencing a Mental Health or [place] or need someone to talk to, please call the Suicide and Crisis Lifeline: 988 call the [place] National Suicide Prevention Lifeline: (253)824-9940 or TTY: 775-093-3581 TTY 705-089-2665) to talk to a trained counselor call 1-800-273-TALK (toll free, 24 hour hotline) call the  [facility]: ([phone] call 911   Patient verbalizes understanding of instructions and care plan provided today and agrees to view in [place]. Active MyChart status and patient understanding of how to access instructions and care plan via MyChart confirmed with patient.     The patient has been provided with contact information for the care management team and has been advised to call with any health related questions or concerns.    Cataleah Stites L. Lavina Hamman, RN, BSN, [place] Coordinator Office number [phone]

## 2022-08-02 NOTE — Patient Outreach (Signed)
  Care Coordination   Follow Up Visit Note   08/02/2022 Name: Eric Saunders MRN: [id number] DOB: 01/25/1956  Eric Saunders is a 66 y.o. year old male who sees Eric Saunders, [employer] for primary care. I spoke with  Eric Saunders by phone today.  What matters to the patients health and wellness today?  Doing well, continues to be frustrated that Eric Saunders has memory concerns Still prefers to prioritize her care over his  Continue with pain but stays active Outside today  Still smokes  Has seen cardiologist in March 2023 and pcp on 07/01/22   Goals Addressed               This Visit's Progress     Patient Stated     Make and Keep medical appointments [facility]) (pt-stated)   On track     Care Coordination Interventions: Discussed plans with patient for ongoing care management follow up and provided patient with direct contact information for care management team Screening for signs and symptoms of depression related to chronic disease state  Assessed social determinant of health barriers Confirmed he attended his scheduled pcp visit 10/16//23 but still has not called [facility] to schedule an appointment yet. He prefers to call himself        Pain management [facility]) (pt-stated)   On track     Started 04/23/22  Care Coordination Interventions: Reviewed provider established plan for pain management Discussed importance of adherence to all scheduled medical appointments Counseled on the importance of reporting any/all new or changed pain symptoms or management strategies to pain management provider Discussed use of relaxation techniques and/or diversional activities to assist with pain reduction (distraction, imagery, relaxation, massage, acupressure, TENS, heat, and cold application Assessed for changes in chronic pain status        SDOH assessments and interventions completed:  No     Care Coordination Interventions Activated:  Yes  Care Coordination Interventions:   Yes, provided   Follow up plan: Follow up call scheduled for 08/30/22    Encounter Outcome:  Pt. Visit Completed   Eric Saunders L. Lavina Hamman, RN, BSN, [place] Coordinator Office number [phone]

## 2022-08-06 ENCOUNTER — Ambulatory Visit (HOSPITAL_COMMUNITY)
Admission: RE | Admit: 2022-08-06 | Discharge: 2022-08-06 | Disposition: A | Discharge status: Home / Self Care | Payer: BC Managed Care – PPO | Source: Ambulatory Visit | Attending: Physician Assistant | Admitting: Physician Assistant

## 2022-08-06 DIAGNOSIS — Z87891 Personal history of nicotine dependence: Secondary | ICD-10-CM | POA: Diagnosis present

## 2022-08-06 DIAGNOSIS — Z122 Encounter for screening for malignant neoplasm of respiratory organs: Secondary | ICD-10-CM | POA: Insufficient documentation

## 2022-08-24 ENCOUNTER — Emergency Department (HOSPITAL_COMMUNITY): Payer: BC Managed Care – PPO

## 2022-08-24 ENCOUNTER — Other Ambulatory Visit: Payer: Self-pay

## 2022-08-24 ENCOUNTER — Encounter (HOSPITAL_COMMUNITY): Payer: Self-pay

## 2022-08-24 ENCOUNTER — Observation Stay (HOSPITAL_COMMUNITY)
Admission: EM | Admit: 2022-08-24 | Discharge: 2022-08-25 | Disposition: A | Discharge status: Home / Self Care | Payer: BC Managed Care – PPO | Attending: Internal Medicine | Admitting: Internal Medicine

## 2022-08-24 DIAGNOSIS — Z79899 Other long term (current) drug therapy: Secondary | ICD-10-CM | POA: Diagnosis not present

## 2022-08-24 DIAGNOSIS — Z7982 Long term (current) use of aspirin: Secondary | ICD-10-CM | POA: Diagnosis not present

## 2022-08-24 DIAGNOSIS — R531 Weakness: Secondary | ICD-10-CM | POA: Diagnosis present

## 2022-08-24 DIAGNOSIS — Z951 Presence of aortocoronary bypass graft: Secondary | ICD-10-CM | POA: Insufficient documentation

## 2022-08-24 DIAGNOSIS — J449 Chronic obstructive pulmonary disease, unspecified: Secondary | ICD-10-CM | POA: Diagnosis not present

## 2022-08-24 DIAGNOSIS — Z96653 Presence of artificial knee joint, bilateral: Secondary | ICD-10-CM | POA: Insufficient documentation

## 2022-08-24 DIAGNOSIS — I10 Essential (primary) hypertension: Secondary | ICD-10-CM | POA: Diagnosis not present

## 2022-08-24 DIAGNOSIS — E785 Hyperlipidemia, unspecified: Secondary | ICD-10-CM | POA: Diagnosis present

## 2022-08-24 DIAGNOSIS — I25118 Atherosclerotic heart disease of native coronary artery with other forms of angina pectoris: Secondary | ICD-10-CM | POA: Diagnosis not present

## 2022-08-24 DIAGNOSIS — I6522 Occlusion and stenosis of left carotid artery: Secondary | ICD-10-CM

## 2022-08-24 DIAGNOSIS — Z720 Tobacco use: Secondary | ICD-10-CM | POA: Diagnosis present

## 2022-08-24 DIAGNOSIS — I2510 Atherosclerotic heart disease of native coronary artery without angina pectoris: Secondary | ICD-10-CM | POA: Diagnosis present

## 2022-08-24 DIAGNOSIS — F17210 Nicotine dependence, cigarettes, uncomplicated: Secondary | ICD-10-CM | POA: Insufficient documentation

## 2022-08-24 DIAGNOSIS — G459 Transient cerebral ischemic attack, unspecified: Principal | ICD-10-CM | POA: Diagnosis present

## 2022-08-24 DIAGNOSIS — F411 Generalized anxiety disorder: Secondary | ICD-10-CM | POA: Diagnosis present

## 2022-08-24 LAB — COMPREHENSIVE METABOLIC PANEL
ALT: 21 U/L (ref 0–44)
AST: 25 U/L (ref 15–41)
Albumin: 3.9 g/dL (ref 3.5–5.0)
Alkaline Phosphatase: 43 U/L (ref 38–126)
Anion gap: 12 (ref 5–15)
BUN: 20 mg/dL (ref 8–23)
CO2: 22 mmol/L (ref 22–32)
Calcium: 9.2 mg/dL (ref 8.9–10.3)
Chloride: 103 mmol/L (ref 98–111)
Creatinine, Ser: 1.1 mg/dL (ref 0.61–1.24)
GFR, Estimated: >60 mL/min (ref >60)
Glucose, Bld: 80 mg/dL (ref 70–99)
Potassium: 4 mmol/L (ref 3.5–5.1)
Sodium: 137 mmol/L (ref 135–145)
Total Bilirubin: 1.1 mg/dL (ref 0.3–1.2)
Total Protein: 6.6 g/dL (ref 6.5–8.1)

## 2022-08-24 LAB — URINALYSIS, ROUTINE W REFLEX MICROSCOPIC
Bilirubin Urine: NEGATIVE
Glucose, UA: NEGATIVE mg/dL
Hgb urine dipstick: NEGATIVE
Ketones, ur: NEGATIVE mg/dL
Leukocytes,Ua: NEGATIVE
Nitrite: NEGATIVE
Protein, ur: NEGATIVE mg/dL
Specific Gravity, Urine: 1.002 — ABNORMAL LOW (ref 1.005–1.030)
pH: 6 (ref 5.0–8.0)

## 2022-08-24 LAB — APTT: aPTT: 21 seconds — ABNORMAL LOW (ref 24–36)

## 2022-08-24 LAB — DIFFERENTIAL
Abs Immature Granulocytes: 0.03 10*3/uL (ref 0.00–0.07)
Basophils Absolute: 0 10*3/uL (ref 0.0–0.1)
Basophils Relative: 0 %
Eosinophils Absolute: 0.2 10*3/uL (ref 0.0–0.5)
Eosinophils Relative: 2 %
Immature Granulocytes: 0 %
Lymphocytes Relative: 35 %
Lymphs Abs: 3 10*3/uL (ref 0.7–4.0)
Monocytes Absolute: 0.6 10*3/uL (ref 0.1–1.0)
Monocytes Relative: 7 %
Neutro Abs: 4.8 10*3/uL (ref 1.7–7.7)
Neutrophils Relative %: 56 %

## 2022-08-24 LAB — CBC
HCT: 42.3 % (ref 39.0–52.0)
Hemoglobin: 14.4 g/dL (ref 13.0–17.0)
MCH: 29.8 pg (ref 26.0–34.0)
MCHC: 34 g/dL (ref 30.0–36.0)
MCV: 87.4 fL (ref 80.0–100.0)
Platelets: 289 10*3/uL (ref 150–400)
RBC: 4.84 MIL/uL (ref 4.22–5.81)
RDW: 13.5 % (ref 11.5–15.5)
WBC: 8.7 10*3/uL (ref 4.0–10.5)
nRBC: 0 % (ref 0.0–0.2)

## 2022-08-24 LAB — RAPID URINE DRUG SCREEN, HOSP PERFORMED
Amphetamines: NOT DETECTED
Barbiturates: NOT DETECTED
Benzodiazepines: NOT DETECTED
Cocaine: NOT DETECTED
Opiates: NOT DETECTED
Tetrahydrocannabinol: POSITIVE — AB

## 2022-08-24 LAB — I-STAT CHEM 8, ED
BUN: 26 mg/dL — ABNORMAL HIGH (ref 8–23)
Calcium, Ion: 0.89 mmol/L — CL (ref 1.15–1.40)
Chloride: 104 mmol/L (ref 98–111)
Creatinine, Ser: 1 mg/dL (ref 0.61–1.24)
Glucose, Bld: 80 mg/dL (ref 70–99)
HCT: 41 % (ref 39.0–52.0)
Hemoglobin: 13.9 g/dL (ref 13.0–17.0)
Potassium: 4.6 mmol/L (ref 3.5–5.1)
Sodium: 135 mmol/L (ref 135–145)
TCO2: 23 mmol/L (ref 22–32)

## 2022-08-24 LAB — PROTIME-INR
INR: 1.1 (ref 0.8–1.2)
Prothrombin Time: 13.6 seconds (ref 11.4–15.2)

## 2022-08-24 NOTE — ED Notes (Signed)
Patient transported to CT 

## 2022-08-24 NOTE — ED Provider Notes (Signed)
[facility] Emergency Department Provider Note MRN:  [id number]  Arrival date & time: 08/25/22     Chief Complaint   Weakness   History of Present Illness   Eric Saunders is a 66 y.o. year-old male presents to the ED with chief complaint of left-sided facial numbness as well as left arm and leg numbness and tingling.  He states that onset of symptoms was at 8 PM (3 hours ago).  He states that this happened after having a coughing spell.  He states that his arm and leg were also shaking when it happened.  He still states that he has some numbness sensation and feels like his leg is a bit weaker than normal.  History of CABG.  Daily smoker.  History provided by patient.   Review of Systems  Pertinent positive and negative review of systems noted in HPI.    Physical Exam   Vitals:   08/24/22 2203 08/25/22 0000  BP: (!) 164/76 133/82  Pulse: 72 69  Resp: 15 17  Temp: 98.2 F (36.8 C)   SpO2: 96% 94%    CONSTITUTIONAL:  non-toxic-appearing, NAD NEURO:  Alert and oriented x 3, decreased sensation to left face when compared to right, but otherwise CN 3-12 intact EYES:  eyes equal and reactive ENT/NECK:  Supple, no stridor  CARDIO:  normal rate, regular rhythm, appears well-perfused  PULM:  No respiratory distress, CTAB GI/GU:  non-distended, no focal tenderness MSK/SPINE:  No gross deformities, no edema, moves all extremities, objective strength of extremities is 5/5, but patient says that in left leg straight leg raise he feels weaker than normal. SKIN:  no rash, atraumatic   *Additional and/or pertinent findings included in MDM below  Diagnostic and Interventional Summary    EKG Interpretation  Date/Time:    Ventricular Rate:    PR Interval:    QRS Duration:   QT Interval:    QTC Calculation:   R Axis:     Text Interpretation:         Labs Reviewed  APTT - Abnormal; Notable for the following components:      Result Value   aPTT 21  (*)    All other components within normal limits  RAPID URINE DRUG SCREEN, HOSP PERFORMED - Abnormal; Notable for the following components:   Tetrahydrocannabinol POSITIVE (*)    All other components within normal limits  URINALYSIS, ROUTINE W REFLEX MICROSCOPIC - Abnormal; Notable for the following components:   Color, Urine COLORLESS (*)    Specific Gravity, Urine 1.002 (*)    All other components within normal limits  I-STAT CHEM 8, ED - Abnormal; Notable for the following components:   BUN 26 (*)    Calcium, Ion 0.89 (*)    All other components within normal limits  PROTIME-INR  CBC  DIFFERENTIAL  COMPREHENSIVE METABOLIC PANEL  ETHANOL  CK  MAGNESIUM  PHOSPHORUS  PREALBUMIN  TSH  BLOOD GAS, VENOUS    CT HEAD WO CONTRAST  Final Result    CT ANGIO HEAD NECK W WO CM    (Results Pending)  MR BRAIN WO CONTRAST    (Results Pending)    Medications - No data to display   Procedures  /  Critical Care Procedures  ED Course and Medical Decision Making  I have reviewed the triage vital signs, the nursing notes, and pertinent available records from the EMR.  Social Determinants Affecting Complexity of Care: Patient has no clinically significant social determinants affecting  this chief complaint..   ED Course:    Medical Decision Making Patient here after having left-sided facial numbness and left arm and leg numbness.  He also reports some subjective weakness in his left lower extremity.  Onset of symptoms was at 8 PM tonight (3 hours prior to arrival).  I discussed the case with neurology, he states that based on relatively minor symptoms, do not activate code stroke as he would not get any emergent intervention.  Neurology to see patient.  Will check labs, CT head.  Amount and/or Complexity of Data Reviewed Labs: ordered.    Details: Laboratory workup interpreted by me me shows low calcium ion on Chem-8, UDS notable for [facility] Radiology: ordered and independent  interpretation performed.    Details: No obvious intracranial hemorrhage  Risk Decision regarding hospitalization.     Consultants: I consulted with Dr. Lorrin Goodell, from neurology, who has seen the patient recommends TIA/stroke workup.  I consulted with Dr. Roel Cluck, who is appreciated for admitting the patient to the hospital.   Treatment and Plan: Patient's exam and diagnostic results are concerning for TIA/stroke.  Feel that patient will need admission to the hospital for further treatment and evaluation.  Patient discussed with attending physician, Dr. Alvino Chapel, who agrees with plan for neuro consult.  Final Clinical Impressions(s) / ED Diagnoses     ICD-10-CM   1. TIA (transient ischemic attack)  G45.9       ED Discharge Orders     None         Discharge Instructions Discussed with and Provided to Patient:   Discharge Instructions   None      Montine Circle, PA-C 08/25/22 0052    Davonna Belling, MD 08/25/22 [id number]

## 2022-08-24 NOTE — ED Triage Notes (Signed)
Patient arrives via EMS for L arm and leg weakness. Per EMS, patient was at home and went into a "coughing spell" and patient states that after that he started developing L arm and L leg numbness/tingling. Patient states that the numbness/tingling resolved after 30 minutes. Daughter and wife were at home with the patient when this occurred and daughter called 73. Patient states that he took a muscle relaxer before coming to ED. Patient ambulatory during triage, states that he feels better but still has a little bit of weakness. Hx of CABG

## 2022-08-24 NOTE — Consult Note (Signed)
NEUROLOGY CONSULTATION NOTE   Date of service: August 24, 2022 Patient Name: Eric Saunders MRN:  [id number] DOB:  04-14-1956 Reason for consult: "L sided weakness after a coughing fit" Requesting Provider: Davonna Belling, MD _ _ _   _ __   _ __ _ _  __ __   _ __   __ _  History of Present Illness  Eric Saunders is a 66 y.o. male with PMH significant for CAD status post CABG, hyperlipidemia, hypertension, daily smoker about 1 pack/day for the last 40 years who presents with left-sided weakness after a coughing fit.  Patient reports that he just got done smoking a cigarette and then came in and had a coughing fit and immediately afterwards, felt his entire left side went numb including his left face arm and leg.  His left arm was tremoring he noticed that his left leg was very floppy and he was hardly able to hold onto anything with his left hand.  He fell down.  Reports he is never had anything of this in the past specially with the left leg weakness and incoordination.  Does endorse that he will occasionally get episodes of left arm tremoring and some coughing fits.  He endorses headache over the last few days but never had anything of this in the past associate with a headache.  He reports he does have a history of hypertension and he checked his blood pressure at home after this event and was elevated to 301 systolic.  Endorses that his cholesterol has been hard to control despite him taking medication.  Denies any history of diabetes.  Does endorse family history of strokes.  Endorses history of CAD and CABG.  He smokes, occasional marijuana, does not drink alcohol. No other recreational substances.  Reports that his left leg symptoms were persistent for about 30 minutes and then eased up a little bit.  His symptoms have pretty much resolved at this time.  LKW: 1950 on 08/24/2022. mRS: 0 tNKASE: Not offered due to resolution of symptoms. Thrombectomy: Not offered due to resolution of  symptoms. NIHSS components Score: Comment  1a Level of Conscious 0'[x]'$  1'[]'$  2'[]'$  3'[]'$      1b LOC Questions 0'[x]'$  1'[]'$  2'[]'$       1c LOC Commands 0'[x]'$  1'[]'$  2'[]'$       2 Best Gaze 0'[x]'$  1'[]'$  2'[]'$       3 Visual 0'[x]'$  1'[]'$  2'[]'$  3'[]'$      4 Facial Palsy 0'[x]'$  1'[]'$  2'[]'$  3'[]'$      5a Motor Arm - left 0'[x]'$  1'[]'$  2'[]'$  3'[]'$  4'[]'$  UN'[]'$    5b Motor Arm - Right 0'[x]'$  1'[]'$  2'[]'$  3'[]'$  4'[]'$  UN'[]'$    6a Motor Leg - Left 0'[x]'$  1'[]'$  2'[]'$  3'[]'$  4'[]'$  UN'[]'$    6b Motor Leg - Right 0'[x]'$  1'[]'$  2'[]'$  3'[]'$  4'[]'$  UN'[]'$    7 Limb Ataxia 0'[x]'$  1'[]'$  2'[]'$  3'[]'$  UN'[]'$     8 Sensory 0'[x]'$  1'[]'$  2'[]'$  UN'[]'$      9 Best Language 0'[x]'$  1'[]'$  2'[]'$  3'[]'$      10 Dysarthria 0'[x]'$  1'[]'$  2'[]'$  UN'[]'$      11 Extinct. and Inattention 0'[x]'$  1'[]'$  2'[]'$       TOTAL: 0       ROS   Constitutional Denies weight loss, fever and chills.   HEENT Denies changes in vision and hearing.   Respiratory Denies SOB and cough.   CV Denies palpitations and CP   GI Denies abdominal pain, nausea, vomiting and diarrhea.   GU Denies dysuria and urinary frequency.   MSK Denies  myalgia and joint pain.   Skin Denies rash and pruritus.   Neurological Mild headache but no syncope.   Psychiatric Denies recent changes in mood. Denies anxiety and depression.    Past History   Past Medical History:  Diagnosis Date   Arthritis    COPD (chronic obstructive pulmonary disease) ([place])    Coronary artery disease    Depression    GERD (gastroesophageal reflux disease)    History of angina    Hyperlipemia    Hypertension    Panic disorder    Tobacco abuse    Past Surgical History:  Procedure Laterality Date   COLONOSCOPY     CORONARY ARTERY BYPASS GRAFT N/A 06/17/2014   Procedure: CORONARY ARTERY BYPASS GRAFTING (CABG) X 4, RIGHT LEG EVH;  Surgeon: Gaye Pollack, MD;  Location: [place];  Service: Open Heart Surgery;  Laterality: N/A;   KNEE SURGERY     bilateral arthroscopic   LEFT HEART CATHETERIZATION WITH CORONARY ANGIOGRAM N/A 06/16/2014   Procedure: LEFT HEART CATHETERIZATION WITH CORONARY ANGIOGRAM;  Surgeon: Burnell Blanks, MD;  Location: [facility] C[facility] LAB;  Service: Cardiovascular;  Laterality: N/A;   TEE WITHOUT CARDIOVERSION N/A 06/17/2014   Procedure: TRANSESOPHAGEAL ECHOCARDIOGRAM (TEE);  Surgeon: Gaye Pollack, MD;  Location: [place];  Service: Open Heart Surgery;  Laterality: N/A;   UPPER GASTROINTESTINAL ENDOSCOPY     Family History  Problem Relation Age of Onset   Drug abuse Sister    Healthy Brother    Diabetes Sister    Healthy Sister    Healthy Sister    Healthy Sister    Healthy Sister    Healthy Brother    Colon cancer Neg Hx    Esophageal cancer Neg Hx    Inflammatory bowel disease Neg Hx    Liver disease Neg Hx    Pancreatic cancer Neg Hx    Rectal cancer Neg Hx    Stomach cancer Neg Hx    Social History   Socioeconomic History   Marital status: Married    Spouse name: Not on file   Number of children: 2   Years of education: Not on file   Highest education level: Not on file  Occupational History   Occupation: Dealer    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   Working on permanent disability.    Social Determinants of Health   Financial Resource Strain: Low Risk  (07/02/2022)   Overall Financial Resource Strain (CARDIA)    Difficulty of Paying Living Expenses: Not hard at all  Food Insecurity: No Food Insecurity (07/02/2022)   Hunger Vital Sign    Worried About Running Out of Food in the Last Year: Never true    Ran Out of Food in the Last Year: Never true   Transportation Needs: No  Transportation Needs (07/02/2022)   PRAPARE - Hydrologist (Medical): No    Lack of Transportation (Non-Medical): No  Physical Activity: Sufficiently Active (07/02/2022)   Exercise Vital Sign    Days of Exercise per Week: 7 days    Minutes of Exercise per Session: 30 min  Stress: No Stress Concern Present (07/02/2022)   [place]    Feeling of Stress : Only a little  Social Connections: Socially Integrated (04/23/2022)   Social Connection and Isolation Panel [NHANES]    Frequency of Communication with Friends and Family: More than three times a week    Frequency of Social Gatherings with Friends and Family: More than three times a week    Attends Religious Services: 1 to 4 times per year    Active Member of Clubs or Organizations: Yes    Attends Archivist Meetings: 1 to 4 times per year    Marital Status: Married   Allergies  Allergen Reactions   Ace Inhibitors Cough    Medications  (Not in a hospital admission)    Vitals   Vitals:   08/24/22 2203 08/24/22 2204  BP: (!) 164/76   Pulse: 72   Resp: 15   Temp: 98.2 F (36.8 C)   TempSrc: Oral   SpO2: 96%   Weight:  83.9 kg  Height:  '5\' 8"'$  (1.727 m)     Body mass index is 28.13 kg/m.  Physical Exam   General: Laying comfortably in bed; in no acute distress.  HENT: Normal oropharynx and mucosa. Normal external appearance of ears and nose.  Neck: Supple, no pain or tenderness  CV: No JVD. No peripheral edema.  Pulmonary: Symmetric Chest rise. Normal respiratory effort.  Abdomen: Soft to touch, non-tender.  Ext: No cyanosis, edema, or deformity  Skin: No rash. Normal palpation of skin.   Musculoskeletal: Normal digits and nails by inspection. No clubbing.   Neurologic Examination  Mental status/Cognition: Alert, oriented to self, place, month and year, good attention.   Speech/language: Fluent, comprehension intact, object naming intact, repetition intact.  Cranial nerves:   CN II Pupils equal and reactive to light, no VF deficits    CN III,IV,VI EOM intact, no gaze preference or deviation, no nystagmus    CN V normal sensation in V1, V2, and V3 segments bilaterally    CN VII no asymmetry, no nasolabial fold flattening    CN VIII normal hearing to speech    CN IX & X normal palatal elevation, no uvular deviation    CN XI 5/5 head turn and 5/5 shoulder shrug bilaterally    CN XII midline tongue protrusion    Motor:  Muscle bulk: Normal, tone normal, pronator drift none tremor none Mvmt Root Nerve  Muscle Right Left Comments  SA C5/6 Ax Deltoid 5 5   EF C5/6 Mc Biceps 5 5   EE C6/7/8 Rad Triceps 5 5   WF C6/7 Med FCR     WE C7/8 PIN ECU     F Ab C8/T1 U ADM/FDI 5 5   HF L1/2/3 Fem Illopsoas 5 5   KE L2/3/4 Fem Quad 5 5   DF L4/5 D Peron Tib Ant 5 5   PF S1/2 Tibial Grc/Sol 5 5    Sensation:  Light touch Intact throughout   Pin prick    Temperature    Vibration   Proprioception    Coordination/Complex Motor:  - Finger  to Nose intact bilaterally - Heel to shin intact bilaterally - Rapid alternating movement are normal - Gait: Deferred Labs   CBC:  Recent Labs  Lab 08/24/22 2242 08/24/22 2251  WBC 8.7  --   NEUTROABS 4.8  --   HGB 14.4 13.9  HCT 42.3 41.0  MCV 87.4  --   PLT 289  --     Basic Metabolic Panel:  Lab Results  Component Value Date   NA 135 08/24/2022   K 4.6 08/24/2022   CO2 22 08/24/2022   GLUCOSE 80 08/24/2022   BUN 26 (H) 08/24/2022   CREATININE 1.00 08/24/2022   CALCIUM 9.2 08/24/2022   GFRNONAA >60 08/24/2022   GFRAA 90 07/10/2020   Lipid Panel:  Lab Results  Component Value Date   LDLCALC 90 07/01/2022   HgbA1c:  Lab Results  Component Value Date   HGBA1C 5.9 01/08/2021   Urine Drug Screen:     Component Value Date/Time   LABOPIA NONE DETECTED 08/24/2022 2251   COCAINSCRNUR NONE DETECTED  08/24/2022 2251   LABBENZ NONE DETECTED 08/24/2022 2251   AMPHETMU NONE DETECTED 08/24/2022 2251   THCU POSITIVE (A) 08/24/2022 2251   LABBARB NONE DETECTED 08/24/2022 2251    Alcohol Level     Component Value Date/Time   ETH <5 05/27/2017 0001    CT Head without contrast(Personally reviewed): CTH was negative for a large hypodensity concerning for a large territory infarct or hyperdensity concerning for an ICH  CT angio Head and Neck with contrast: pending  MRI Brain: pending   Impression   Eric Saunders is a 66 y.o. male with PMH significant for multiple stroke risk factors including hypertension, hyperlipidemia, CAD, current smoker who presents with an episode of coughing fit followed by left-sided numbness involving left face, left arm and left leg along with significant incoordination and almost limp in his left leg.  This went on for 30 minutes and then started easing up.  He has minimal to no deficit at this time.  Overall, presentation is most concerning for a TIA/minor ischemic stroke. Recommendations   - Frequent Neuro checks per stroke unit protocol - Recommend brain imaging with MRI Brain without contrast - Recommend Vascular imaging with CT angio head and neck - Recommend obtaining TTE - Recommend obtaining Lipid panel with LDL - Please start statin if LDL > 70 - Recommend HbA1c - Antithrombotic -aspirin 81 mg daily along with Plavix 75 mg daily for 21 days, followed by aspirin 81 mg daily alone. - Recommend DVT ppx - SBP goal - permissive hypertension first 24 h < 220/110. Held home meds.  - Recommend Telemetry monitoring for arrythmia - Recommend bedside swallow screen prior to PO intake. - Stroke education booklet - Recommend PT/OT/SLP consult   ______________________________________________________________________   Thank you for the opportunity to take part in the care of this patient. If you have any further questions, please contact the neurology  consultation attending.  Signed,  [place] Pager Number [phone] _ _ _   _ __   _ __ _ _  __ __   _ __   __ _

## 2022-08-25 ENCOUNTER — Observation Stay (HOSPITAL_BASED_OUTPATIENT_CLINIC_OR_DEPARTMENT_OTHER): Payer: BC Managed Care – PPO

## 2022-08-25 ENCOUNTER — Emergency Department (HOSPITAL_COMMUNITY): Payer: BC Managed Care – PPO

## 2022-08-25 ENCOUNTER — Other Ambulatory Visit (HOSPITAL_COMMUNITY): Payer: BC Managed Care – PPO

## 2022-08-25 ENCOUNTER — Encounter (HOSPITAL_COMMUNITY): Payer: BC Managed Care – PPO

## 2022-08-25 ENCOUNTER — Observation Stay (HOSPITAL_COMMUNITY): Payer: BC Managed Care – PPO

## 2022-08-25 DIAGNOSIS — I2510 Atherosclerotic heart disease of native coronary artery without angina pectoris: Secondary | ICD-10-CM | POA: Diagnosis not present

## 2022-08-25 DIAGNOSIS — G459 Transient cerebral ischemic attack, unspecified: Secondary | ICD-10-CM | POA: Diagnosis not present

## 2022-08-25 DIAGNOSIS — G458 Other transient cerebral ischemic attacks and related syndromes: Secondary | ICD-10-CM

## 2022-08-25 DIAGNOSIS — I6522 Occlusion and stenosis of left carotid artery: Secondary | ICD-10-CM

## 2022-08-25 DIAGNOSIS — E785 Hyperlipidemia, unspecified: Secondary | ICD-10-CM

## 2022-08-25 DIAGNOSIS — R55 Syncope and collapse: Secondary | ICD-10-CM

## 2022-08-25 DIAGNOSIS — I10 Essential (primary) hypertension: Secondary | ICD-10-CM | POA: Diagnosis not present

## 2022-08-25 DIAGNOSIS — J449 Chronic obstructive pulmonary disease, unspecified: Secondary | ICD-10-CM

## 2022-08-25 DIAGNOSIS — R054 Cough syncope: Secondary | ICD-10-CM | POA: Diagnosis not present

## 2022-08-25 DIAGNOSIS — Z720 Tobacco use: Secondary | ICD-10-CM

## 2022-08-25 DIAGNOSIS — F411 Generalized anxiety disorder: Secondary | ICD-10-CM

## 2022-08-25 LAB — LIPID PANEL
Cholesterol: 134 mg/dL (ref 0–200)
HDL: 34 mg/dL — ABNORMAL LOW (ref >40)
LDL Cholesterol: 74 mg/dL (ref 0–99)
Total CHOL/HDL Ratio: 3.9 RATIO
Triglycerides: 132 mg/dL (ref <150)
VLDL: 26 mg/dL (ref 0–40)

## 2022-08-25 LAB — CK: Total CK: 183 U/L (ref 49–397)

## 2022-08-25 LAB — PHOSPHORUS: Phosphorus: 4.3 mg/dL (ref 2.5–4.6)

## 2022-08-25 LAB — MAGNESIUM: Magnesium: 2.1 mg/dL (ref 1.7–2.4)

## 2022-08-25 LAB — ECHOCARDIOGRAM COMPLETE
AR max vel: 2.71 cm2
AV Area VTI: 2.92 cm2
AV Area mean vel: 2.85 cm2
AV Mean grad: 3 mmHg
AV Peak grad: 5.5 mmHg
Ao pk vel: 1.17 m/s
Area-P 1/2: 3.7 cm2
Height: 68 in
MV VTI: 2.44 cm2
S' Lateral: 2.9 cm
Weight: 2960 oz

## 2022-08-25 LAB — HIV ANTIBODY (ROUTINE TESTING W REFLEX): HIV Screen 4th Generation wRfx: NONREACTIVE

## 2022-08-25 LAB — PREALBUMIN: Prealbumin: 30 mg/dL (ref 18–38)

## 2022-08-25 LAB — TSH: TSH: 2.047 u[IU]/mL (ref 0.350–4.500)

## 2022-08-25 MED ORDER — NICOTINE 21 MG/24HR TD PT24
21.0000 mg | MEDICATED_PATCH | Freq: Every day | TRANSDERMAL | Status: DC
  Filled 2022-08-25: qty 1

## 2022-08-25 MED ORDER — CLOPIDOGREL BISULFATE 75 MG PO TABS: 75.0000 mg | ORAL_TABLET | Freq: Every day | ORAL | 0 refills | Status: AC

## 2022-08-25 MED ORDER — CLOPIDOGREL BISULFATE 75 MG PO TABS
75.0000 mg | ORAL_TABLET | Freq: Every day | ORAL | Status: DC
  Administered 2022-08-25: 75 mg via ORAL
  Filled 2022-08-25: qty 1

## 2022-08-25 MED ORDER — ACETAMINOPHEN 325 MG PO TABS
650.0000 mg | ORAL_TABLET | ORAL | Status: DC | PRN
  Administered 2022-08-25: 650 mg via ORAL
  Filled 2022-08-25: qty 2

## 2022-08-25 MED ORDER — ROSUVASTATIN CALCIUM 20 MG PO TABS
40.0000 mg | ORAL_TABLET | Freq: Every day | ORAL | Status: DC
  Administered 2022-08-25: 40 mg via ORAL
  Filled 2022-08-25: qty 2

## 2022-08-25 MED ORDER — ALPRAZOLAM 0.25 MG PO TABS
0.5000 mg | ORAL_TABLET | Freq: Two times a day (BID) | ORAL | Status: DC | PRN
  Administered 2022-08-25: 0.5 mg via ORAL
  Filled 2022-08-25: qty 2

## 2022-08-25 MED ORDER — ESCITALOPRAM OXALATE 10 MG PO TABS
40.0000 mg | ORAL_TABLET | Freq: Every day | ORAL | Status: DC
  Administered 2022-08-25: 40 mg via ORAL
  Filled 2022-08-25 (×2): qty 4

## 2022-08-25 MED ORDER — PANTOPRAZOLE SODIUM 40 MG PO TBEC
80.0000 mg | DELAYED_RELEASE_TABLET | Freq: Every day | ORAL | Status: DC
  Administered 2022-08-25: 80 mg via ORAL
  Filled 2022-08-25: qty 2

## 2022-08-25 MED ORDER — ASPIRIN 81 MG PO TBEC
81.0000 mg | DELAYED_RELEASE_TABLET | Freq: Every day | ORAL | Status: DC
  Administered 2022-08-25: 81 mg via ORAL
  Filled 2022-08-25: qty 1

## 2022-08-25 MED ORDER — ACETAMINOPHEN 650 MG RE SUPP: 650.0000 mg | RECTAL | Status: DC | PRN

## 2022-08-25 MED ORDER — SODIUM CHLORIDE 0.9 % IV SOLN: INTRAVENOUS | Status: DC

## 2022-08-25 MED ORDER — FENOFIBRATE 160 MG PO TABS
160.0000 mg | ORAL_TABLET | Freq: Every day | ORAL | Status: DC
  Administered 2022-08-25: 160 mg via ORAL
  Filled 2022-08-25: qty 1

## 2022-08-25 MED ORDER — IOHEXOL 350 MG/ML SOLN
75.0000 mL | Freq: Once | INTRAVENOUS | Status: AC | PRN
  Administered 2022-08-25: 75 mL via INTRAVENOUS

## 2022-08-25 MED ORDER — STROKE: EARLY STAGES OF RECOVERY BOOK: Freq: Once | Status: DC

## 2022-08-25 MED ORDER — ACETAMINOPHEN 160 MG/5ML PO SOLN: 650.0000 mg | ORAL | Status: DC | PRN

## 2022-08-25 NOTE — ED Notes (Signed)
This RN reviewed discharge instructions with patient. He verbalized understanding and denied any further questions. PT well appearing upon discharge and reports tolerable pain. Pt ambulated with stable gait to exit. Pt endorses ride home.

## 2022-08-25 NOTE — Progress Notes (Signed)
PT Cancellation Note  Patient Details Name: Eric Saunders MRN: [id number] DOB: Apr 28, 1956   Cancelled Treatment:    Reason Eval/Treat Not Completed: Patient at procedure or test/unavailable  Patient gone to MRI. Will reattempt as schedule permits.    [place]  Office [phone]  Rexanne Mano 08/25/2022, 2:23 PM

## 2022-08-25 NOTE — Assessment & Plan Note (Signed)
-   Spoke about importance of quitting spent 5 minutes discussing options for treatment, prior attempts at quitting, and dangers of smoking ? -At this point patient is    interested in quitting ? - order nicotine patch  ? - nursing tobacco cessation protocol ? ?

## 2022-08-25 NOTE — ED Notes (Addendum)
Up to b/r, steady gait, back to stretcher/room, admitting at [facility]. Pt alert, NAD, calm, interactive, denies sx or complaints at this time, denies HA, numbness, tingling. Speech clear.

## 2022-08-25 NOTE — H&P (Signed)
Eric Saunders QIW:[id number] DOB: [birth year] DOA: 08/24/2022     PCP: [name], FNP   Outpatient Specialists:  CARDS:   Dr. Percival Spanish    Patient arrived to ER on 08/24/22 at 2145 Referred by Attending Davonna Belling, MD   Patient coming from:    home Lives  With family    Chief Complaint:   Chief Complaint  Patient presents with   Weakness    HPI: Eric Saunders is a 66 y.o. male with medical history significant of CAD s/p CABG, HTN on, HLD, tobacco abuse, COPD, depression, GERD    Presented with left-sided weakness Left arm and leg weakness after having a coughing spell this resolved after 30 minutes family were at bedside and called 911 Patient states he is feeling better but still has a little bit of weakness Patient was just done smoking is bit cigarette when developed significant coughing and then developed right-sided weakness numbness.  Also including his face arm and leg. Given severity has a fall down he felt that his arm and leg was completely floppy Has been having some headache for the past few days Now having left side weakness on the leg too but mild Reports smokes every day bu can quit, drinks on occasion but to day had 3 beers  Reports taking his meds   Regarding pertinent Chronic problems:     Hyperlipidemia -  on statins Crestor fenofibrate Lipid Panel     Component Value Date/Time   CHOL 157 07/01/2022 0916   CHOL 166 12/28/2012 0932   TRIG 90 07/01/2022 0916   TRIG 157 (H) 11/23/2014 1549   TRIG 129 12/28/2012 0932   HDL 50 07/01/2022 0916   HDL 44 11/23/2014 1549   HDL 42 12/28/2012 0932   CHOLHDL 3.1 07/01/2022 0916   CHOLHDL 5.6 01/04/2020 0210   VLDL 64 (H) 01/04/2020 0210   LDLCALC 90 07/01/2022 0916   LDLCALC 90 07/06/2014 0000   LDLCALC 98 12/28/2012 0932   LABVLDL 17 07/01/2022 0916     HTN on Norvasc HCTZ      CAD  - On Aspirin, statin,                  -  followed by cardiology                   COPD -  not  followed by pulmonology   not  on baseline oxygen   not compliant with inhalers    While in ER:   Neurology is being consulted and recommended stroke workup    CT HEAD   NON acute  CXR - ordered MRi brain non acute    Following Medications were ordered in ER: Medications - No data to display  _______________________________________________________ ER Provider Called:  Neurolgy   Dr. Lorrin Goodell They Recommend admit to medicine    SEEN in ER   ED Triage Vitals  Enc Vitals Group     BP 08/24/22 2203 (!) 164/76     Pulse Rate 08/24/22 2203 72     Resp 08/24/22 2203 15     Temp 08/24/22 2203 98.2 F (36.8 C)     Temp Source 08/24/22 2203 Oral     SpO2 08/24/22 2203 96 %     Weight 08/24/22 2204 185 lb (83.9 kg)     Height 08/24/22 2204 '5\' 8"'$  (1.727 m)     Head Circumference --      Peak Flow --  Pain Score 08/24/22 2203 0     Pain Loc --      Pain Edu? --      Excl. in [place]? --   TMAX(24)@     _________________________________________ Significant initial  Findings: Abnormal Labs Reviewed  APTT - Abnormal; Notable for the following components:      Result Value   aPTT 21 (*)    All other components within normal limits  RAPID URINE DRUG SCREEN, HOSP PERFORMED - Abnormal; Notable for the following components:   Tetrahydrocannabinol POSITIVE (*)    All other components within normal limits  URINALYSIS, ROUTINE W REFLEX MICROSCOPIC - Abnormal; Notable for the following components:   Color, Urine COLORLESS (*)    Specific Gravity, Urine 1.002 (*)    All other components within normal limits  I-STAT CHEM 8, ED - Abnormal; Notable for the following components:   BUN 26 (*)    Calcium, Ion 0.89 (*)    All other components within normal limits     ECG: Ordered Personally reviewed and interpreted by me showing: HR : 70 Rhythm: Sinus rhythm Borderline prolonged PR interval Probable left atrial enlargement Abnormal R-wave progression, QTC 421      The recent  clinical data is shown below. Vitals:   08/24/22 2203 08/24/22 2204 08/25/22 0000  BP: (!) 164/76  133/82  Pulse: 72  69  Resp: 15  17  Temp: 98.2 F (36.8 C)    TempSrc: Oral    SpO2: 96%  94%  Weight:  83.9 kg   Height:  '5\' 8"'$  (1.727 m)     WBC     Component Value Date/Time   WBC 8.7 08/24/2022 2242   LYMPHSABS 3.0 08/24/2022 2242   LYMPHSABS 2.5 07/01/2022 0916   MONOABS 0.6 08/24/2022 2242   EOSABS 0.2 08/24/2022 2242   EOSABS 0.2 07/01/2022 0916   BASOSABS 0.0 08/24/2022 2242   BASOSABS 0.1 07/01/2022 0916      UA   no evidence of UTI     Urine analysis:    Component Value Date/Time   COLORURINE COLORLESS (A) 08/24/2022 2252   APPEARANCEUR CLEAR 08/24/2022 2252   APPEARANCEUR Clear 07/25/2021 1540   LABSPEC 1.002 (L) 08/24/2022 2252   PHURINE 6.0 08/24/2022 2252   GLUCOSEU NEGATIVE 08/24/2022 2252   HGBUR NEGATIVE 08/24/2022 2252   BILIRUBINUR NEGATIVE 08/24/2022 2252   BILIRUBINUR Negative 07/25/2021 1540   KETONESUR NEGATIVE 08/24/2022 2252   PROTEINUR NEGATIVE 08/24/2022 2252   NITRITE NEGATIVE 08/24/2022 2252   LEUKOCYTESUR NEGATIVE 08/24/2022 2252    Results for orders placed or performed in visit on 10/10/21  Cdiff NAA+O+P+Stool Culture     Status: None   Collection Time: 10/10/21 11:08 AM   Specimen: Stool   ST  Result Value Ref Range Status   Salmonella/Shigella Screen Final report  Final   Stool Culture result 1 (RSASHR) Comment  Final    Comment: No Salmonella or Shigella recovered.   Campylobacter Culture Final report  Final   Stool Culture result 1 (CMPCXR) Comment  Final    Comment: No Campylobacter species isolated.   E coli, Shiga toxin Assay Negative Negative Final   OVA + PARASITE EXAM Final report  Final    Comment: These results were obtained using wet preparation(s) and trichrome stained smear. This test does not include testing for Cryptosporidium parvum, Cyclospora, or Microsporidia.    O&P result 1 Comment  Final     Comment: No ova, cysts, or parasites seen. One  negative specimen does not rule out the possibility of a parasitic infection.    Toxigenic C. Difficile by PCR Negative Negative Final     _______________________________________________ Hospitalist was called for admission for TIA work up    The following Work up has been ordered so far:  Orders Placed This Encounter  Procedures   CT HEAD WO CONTRAST   CT ANGIO HEAD NECK W WO CM   MR BRAIN WO CONTRAST   Ethanol   Protime-INR   APTT   CBC   Differential   Comprehensive metabolic panel   Urine rapid drug screen (hosp performed)   Urinalysis, Routine w reflex microscopic   Diet NPO time specified   Vital signs   Cardiac Monitoring   NIH stroke score   Modified Stroke Scale (mNIHSS) Document mNIHSS assessment every 2 hours for a total of 12 hours   Swallow screen   Initiate Carrier Fluid Protocol   If O2 sat If O2 Sat < 94%, administer O2 at 2 liters/minute via nasal cannula.   Consult to hospitalist   Pulse oximetry, continuous   I-stat chem 8, ED   EKG 12-Lead   ED EKG   Saline lock IV     OTHER Significant initial  Findings:  labs showing:   Recent Labs  Lab 08/24/22 2242 08/24/22 2251  NA 137 135  K 4.0 4.6  CO2 22  --   GLUCOSE 80 80  BUN 20 26*  CREATININE 1.10 1.00  CALCIUM 9.2  --     Cr  stable,   Lab Results  Component Value Date   CREATININE 1.00 08/24/2022   CREATININE 1.10 08/24/2022   CREATININE 1.02 07/01/2022    Recent Labs  Lab 08/24/22 2242  AST 25  ALT 21  ALKPHOS 43  BILITOT 1.1  PROT 6.6  ALBUMIN 3.9   Lab Results  Component Value Date   CALCIUM 9.2 08/24/2022          Plt: Lab Results  Component Value Date   PLT 289 08/24/2022    COVID-19 Labs  No results for input(s): "DDIMER", "FERRITIN", "LDH", "CRP" in the last 72 hours.  Lab Results  Component Value Date   [place] Not Detected 10/31/2020   SARSCOV2NAA RESULT: NEGATIVE 05/05/2020   SARSCOV2NAA  NEGATIVE 01/03/2020   SARSCOV2NAA NOT DETECTED 06/01/2019        Recent Labs  Lab 08/24/22 2242 08/24/22 2251  WBC 8.7  --   NEUTROABS 4.8  --   HGB 14.4 13.9  HCT 42.3 41.0  MCV 87.4  --   PLT 289  --     HG/HCT stable,       Component Value Date/Time   HGB 13.9 08/24/2022 2251   HGB 15.4 07/01/2022 0916   HCT 41.0 08/24/2022 2251   HCT 47.4 07/01/2022 0916   MCV 87.4 08/24/2022 2242   MCV 90 07/01/2022 0916       Cultures:    Component Value Date/Time   SDES WOUND BURSA 01/03/2020 1052   SPECREQUEST NONE 01/03/2020 1052   CULT FEW STAPHYLOCOCCUS AUREUS 01/03/2020 1052   REPTSTATUS 01/05/2020 FINAL 01/03/2020 1052     Radiological Exams on Admission: CT HEAD WO CONTRAST  Result Date: 08/24/2022 CLINICAL DATA:  Left arm and leg weakness, initial encounter EXAM: CT HEAD WITHOUT CONTRAST TECHNIQUE: Contiguous axial images were obtained from the base of the skull through the vertex without intravenous contrast. RADIATION DOSE REDUCTION: This exam was performed according to the departmental dose-optimization program  which includes automated exposure control, adjustment of the mA and/or kV according to patient size and/or use of iterative reconstruction technique. COMPARISON:  01/03/2020 FINDINGS: Brain: No evidence of acute infarction, hemorrhage, hydrocephalus, extra-axial collection or mass lesion/mass effect. Vascular: No hyperdense vessel or unexpected calcification. Skull: Normal. Negative for fracture or focal lesion. Sinuses/Orbits: Mucosal thickening and retention cysts are noted within the maxillary antra bilaterally. This has increased in the interval from the prior exam. Postsurgical changes in the globes are seen stable from the prior exam. Other: None IMPRESSION: No acute intracranial abnormality noted. Mucosal changes within the paranasal sinuses as described. Electronically Signed   By: Inez Catalina M.D.   On: 08/24/2022 22:58    _______________________________________________________________________________________________________ Latest  Blood pressure 133/82, pulse 69, temperature 98.2 F (36.8 C), temperature source Oral, resp. rate 17, height '5\' 8"'$  (1.727 m), weight 83.9 kg, SpO2 94 %.   Vitals  labs and radiology finding personally reviewed  Review of Systems:    Pertinent positives include:   localizing neurological complaints  Constitutional:  No weight loss, night sweats, Fevers, chills, fatigue, weight loss  HEENT:  No headaches, Difficulty swallowing,Tooth/dental problems,Sore throat,  No sneezing, itching, ear ache, nasal congestion, post nasal drip,  Cardio-vascular:  No chest pain, Orthopnea, PND, anasarca, dizziness, palpitations.no Bilateral lower extremity swelling  GI:  No heartburn, indigestion, abdominal pain, nausea, vomiting, diarrhea, change in bowel habits, loss of appetite, melena, blood in stool, hematemesis Resp:  no shortness of breath at rest. No dyspnea on exertion, No excess mucus, no productive cough, No non-productive cough, No coughing up of blood.No change in color of mucus.No wheezing. Skin:  no rash or lesions. No jaundice GU:  no dysuria, change in color of urine, no urgency or frequency. No straining to urinate.  No flank pain.  Musculoskeletal:  No joint pain or no joint swelling. No decreased range of motion. No back pain.  Psych:  No change in mood or affect. No depression or anxiety. No memory loss.  Neuro: no, no tingling, no weakness, no double vision, no gait abnormality, no slurred speech, no confusion  All systems reviewed and apart from [place] all are negative _______________________________________________________________________________________________ Past Medical History:   Past Medical History:  Diagnosis Date   Arthritis    COPD (chronic obstructive pulmonary disease) ([place])    Coronary artery disease    Depression    GERD (gastroesophageal reflux  disease)    History of angina    Hyperlipemia    Hypertension    Panic disorder    Tobacco abuse      Past Surgical History:  Procedure Laterality Date   COLONOSCOPY     CORONARY ARTERY BYPASS GRAFT N/A 06/17/2014   Procedure: CORONARY ARTERY BYPASS GRAFTING (CABG) X 4, RIGHT LEG EVH;  Surgeon: Gaye Pollack, MD;  Location: [facility];  Service: Open Heart Surgery;  Laterality: N/A;   KNEE SURGERY     bilateral arthroscopic   LEFT HEART CATHETERIZATION WITH CORONARY ANGIOGRAM N/A 06/16/2014   Procedure: LEFT HEART CATHETERIZATION WITH CORONARY ANGIOGRAM;  Surgeon: Burnell Blanks, MD;  Location: [facility] CATH LAB;  Service: Cardiovascular;  Laterality: N/A;   TEE WITHOUT CARDIOVERSION N/A 06/17/2014   Procedure: TRANSESOPHAGEAL ECHOCARDIOGRAM (TEE);  Surgeon: Gaye Pollack, MD;  Location: [facility];  Service: Open Heart Surgery;  Laterality: N/A;   UPPER GASTROINTESTINAL ENDOSCOPY      Social History:  Ambulatory   independently       reports that he has been smoking  cigarettes. He started smoking about 33 years ago. He has a 30.00 pack-year smoking history. He has never used smokeless tobacco. He reports current alcohol use of about 6.0 standard drinks of alcohol per week. He reports that he does not use drugs.     Family History:   Family History  Problem Relation Age of Onset   Drug abuse Sister    Healthy Brother    Diabetes Sister    Healthy Sister    Healthy Sister    Healthy Sister    Healthy Sister    Healthy Brother    Colon cancer Neg Hx    Esophageal cancer Neg Hx    Inflammatory bowel disease Neg Hx    Liver disease Neg Hx    Pancreatic cancer Neg Hx    Rectal cancer Neg Hx    Stomach cancer Neg Hx    ______________________________________________________________________________________________ Allergies: Allergies  Allergen Reactions   Ace Inhibitors Cough     Prior to Admission medications   Medication Sig Start Date End Date Taking? Authorizing  Provider  ALPRAZolam Duanne Moron) 0.5 MG tablet Take 1 tablet (0.5 mg total) by mouth 2 (two) times daily as needed for anxiety. 07/01/22   Hassell Done, Mary-Margaret, FNP  amLODipine (NORVASC) 10 MG tablet Take 1 tablet (10 mg total) by mouth daily. 07/01/22   Hassell Done Mary-Margaret, FNP  aspirin 81 MG tablet Take 81 mg by mouth daily.    [provider]  azithromycin (ZITHROMAX Z-PAK) 250 MG tablet As directed 07/03/22   Hassell Done, Mary-Margaret, FNP  benzonatate (TESSALON) 100 MG capsule TAKE 1 CAPSULE BY MOUTH THREE TIMES A DAY AS NEEDED FOR COUGH 07/01/22   Hassell Done, Mary-Margaret, FNP  cloNIDine (CATAPRES) 0.1 MG tablet Take 1 tablet (0.1 mg total) by mouth 2 (two) times daily. 07/01/22   Hassell Done, Mary-Margaret, FNP  cyclobenzaprine (FLEXERIL) 10 MG tablet Take 1 tablet (10 mg total) by mouth 2 (two) times daily as needed. 07/01/22   Hassell Done, Mary-Margaret, FNP  escitalopram (LEXAPRO) 20 MG tablet Take 2 tablets (40 mg total) by mouth daily. 07/01/22   Hassell Done, Mary-Margaret, FNP  fenofibrate 160 MG tablet Take 1 tablet (160 mg total) by mouth daily. 07/01/22   Hassell Done, Mary-Margaret, FNP  hydrochlorothiazide (HYDRODIURIL) 25 MG tablet Take 1 tablet (25 mg total) by mouth daily. 07/01/22   Hassell Done, Mary-Margaret, FNP  Ibuprofen-Famotidine (DUEXIS) 800-26.6 MG TABS Take 1 tablet by mouth 3 (three) times daily as needed. 07/01/22   Hassell Done, Mary-Margaret, FNP  losartan (COZAAR) 100 MG tablet Take 1 tablet (100 mg total) by mouth daily. 07/01/22   Hassell Done, Mary-Margaret, FNP  metoprolol succinate (TOPROL-XL) 25 MG 24 hr tablet Take 1 tablet (25 mg total) by mouth daily. 07/01/22   Hassell Done, Mary-Margaret, FNP  omeprazole (PRILOSEC) 40 MG capsule Take 1 capsule (40 mg total) by mouth 2 (two) times daily. 07/01/22   Hassell Done, Mary-Margaret, FNP  pravastatin (PRAVACHOL) 80 MG tablet Take 0.5 tablets (40 mg total) by mouth daily. 07/01/22   Hassell Done, Mary-Margaret, FNP  rosuvastatin (CRESTOR) 40 MG tablet Take 1 tablet  (40 mg total) by mouth daily. 07/01/22   Hassell Done, Mary-Margaret, FNP  sildenafil (VIAGRA) 100 MG tablet 0 mg by oral route. 07/04/15   [provider]  Sodium Sulfate-Mag Sulfate-KCl (SUTAB) [phone] MG TABS TAKE 24 TABLETS BY MOUTH AS DIRECTED.    [provider]  triamcinolone cream (KENALOG) 0.1 % Apply 1 application topically 2 (two) times daily. 01/08/21   [name], FNP    ___________________________________________________________________________________________________ Physical Exam:  08/25/2022   12:00 AM 08/24/2022   10:04 PM 08/24/2022   10:03 PM  Vitals with BMI  Height  '5\' 8"'$    Weight  185 lbs   BMI  60.63   Systolic 016  010  Diastolic 82  76  Pulse 69  72    1. General:  in No  Acute distress   Chronically ill   -appearing 2. Psychological: Alert and   Oriented 3. Head/ENT:   Dry Mucous Membranes                          Head Non traumatic, neck supple                          Poor Dentition 4. SKIN:  decreased Skin turgor,  Skin clean Dry and intact no rash 5. Heart: Regular rate and rhythm no  Murmur, no Rub or gallop 6. Lungs:   no wheezes or crackles   7. Abdomen: Soft,  non-tender, Non distended   obese  bowel sounds present 8. Lower extremities: no clubbing, cyanosis, no  edema 9. Neurologically  strength 5 out of 5 in all 4 extremities cranial nerves II through XII intact 10. MSK: Normal range of motion    Chart has been reviewed   ________  Assessment/Plan 66 y.o. male with medical history significant of CAD s/p CABG, HTN on, HLD, tobacco abuse, COPD, depression, GERD   Admitted for TIA work up   Present on Admission:  TIA (transient ischemic attack)  Hyperlipidemia with target LDL less than 100  Generalized anxiety disorder  Chronic obstructive lung disease ([place])  Tobacco abuse  Essential hypertension  Coronary arteriosclerosis     TIA (transient ischemic attack)  - will admit based on TIA/CVA protocol,          Monitor on Tele       MRA/MRI await  results         CTA          Echo to evaluate for possible embolic source,        obtain cardiac enzymes,  ECG,   Lipid panel, TSH.        Order PT/OT evaluation.        keep nothing by mouth until passes swallow eval         Will make sure patient is on antiplatelet ASA 81 t and statin        Allow permissive Hypertension keep BP <220/120        Neurology consulted Have seen pt in ER    Hyperlipidemia with target LDL less than 100 For now continue Crestor 40 mg p.o. daily Check lipid panel adjust as needed  Generalized anxiety disorder Continue home medications encouraged to discontinue marijuana use  Chronic obstructive lung disease (HCC) Chronic stable continue to monitor  Tobacco abuse  - Spoke about importance of quitting spent 5 minutes discussing options for treatment, prior attempts at quitting, and dangers of smoking  -At this point patient is    interested in quitting  - order nicotine patch   - nursing tobacco cessation protocol   Essential hypertension Allow permissive hypertension  Coronary arteriosclerosis Chronic stable continue aspirin and statin hold off on beta-blocker for tonight resume when able    Other plan as per orders.  DVT prophylaxis:  SCD     Code Status:    Code Status: Prior FULL CODE  as per patient   I had personally discussed CODE STATUS with patient    Family Communication:   Family not at  Bedside    Disposition Plan:     To home once workup is complete and patient is stable   Following barriers for discharge:                                                  Will need consultants to evaluate patient prior to discharge                      Would benefit from PT/OT eval prior to DC  Ordered                                       Consults called:  neurology is aware  Admission status:  ED Disposition     ED Disposition  Admit   Condition  --   Comment  The patient appears  reasonably stabilized for admission considering the current resources, flow, and capabilities available in the ED at this time, and I doubt any other [facility] requiring further screening and/or treatment in the ED prior to admission is  present.           Obs    Level of care     tele   indefinitely please discontinue once patient no longer qualifies COVID-19 Labs    Eric Saunders 08/25/2022, 1:48 AM    [facility]     after 2 AM please page floor coverage PA If 7AM-7PM, please contact the day team taking care of the patient using [email or web address]   Patient was evaluated in the context of the global COVID-19 pandemic, which necessitated consideration that the patient might be at risk for infection with the SARS-CoV-2 virus that causes COVID-19. Institutional protocols and algorithms that pertain to the evaluation of patients at risk for COVID-19 are in a state of rapid change based on information released by regulatory bodies including the CDC and federal and state organizations. These policies and algorithms were followed during the patient's care.

## 2022-08-25 NOTE — Subjective & Objective (Signed)
Left arm and leg weakness after having a coughing spell this resolved after 30 minutes family were at bedside and called 911 Patient states he is feeling better but still has a little bit of weakness Patient was just done smoking is bit cigarette when developed significant coughing and then developed right-sided weakness numbness.  Also including his face arm and leg. Given severity has a fall down he felt that his arm and leg was completely floppy Has been having some headache for the past few days

## 2022-08-25 NOTE — ED Notes (Signed)
MD at BS

## 2022-08-25 NOTE — Assessment & Plan Note (Signed)
Allow permissive hypertension 

## 2022-08-25 NOTE — Assessment & Plan Note (Signed)
-   will admit based on TIA/CVA protocol,         Monitor on Tele       MRA/MRI await  results         CTA          Echo to evaluate for possible embolic source,        obtain cardiac enzymes,  ECG,   Lipid panel, TSH.        Order PT/OT evaluation.        keep nothing by mouth until passes swallow eval         Will make sure patient is on antiplatelet ASA 81 t and statin        Allow permissive Hypertension keep BP <220/120        Neurology consulted Have seen pt in ER

## 2022-08-25 NOTE — ED Notes (Signed)
To MRI denies sx or complaints, VSS.

## 2022-08-25 NOTE — [place] Notes (Signed)
*  PRELIMINARY RESULTS* Echocardiogram 2D Echocardiogram has been performed.  Eric Saunders 08/25/2022, 4:12 PM

## 2022-08-25 NOTE — Progress Notes (Addendum)
STROKE TEAM PROGRESS NOTE   INTERVAL HISTORY No family at the bedside. Neuro exam is now back to baseline, no deficits or issues.  Pt gives me a very long history of events since 2015. He has had extensive work up and evaluation. He states these spell of LOC after coughing with left arm jerking has been going on since 2015, but never leg, thus his ER visit. He also c/o freq Left arm pain, dizziness, bulrred vision. MRI C-spine added to r/o dissection given report of violent coughing fit.    Vitals:   08/25/22 0652 08/25/22 0700 08/25/22 0730 08/25/22 0800  BP:  (!) 162/93 (!) 161/86 (!) 148/97  Pulse:  68 74 70  Resp:  '18 16 14  '$ Temp: 98.3 F (36.8 C)     TempSrc: Oral     SpO2:  90% 93% 93%  Weight:      Height:       CBC:  Recent Labs  Lab 08/24/22 2242 08/24/22 2251  WBC 8.7  --   NEUTROABS 4.8  --   HGB 14.4 13.9  HCT 42.3 41.0  MCV 87.4  --   PLT 289  --    Basic Metabolic Panel:  Recent Labs  Lab 08/24/22 2242 08/24/22 2251 08/25/22 0415  NA 137 135  --   K 4.0 4.6  --   CL 103 104  --   CO2 22  --   --   GLUCOSE 80 80  --   BUN 20 26*  --   CREATININE 1.10 1.00  --   CALCIUM 9.2  --   --   MG  --   --  2.1  PHOS  --   --  4.3   Lipid Panel: No results for input(s): "CHOL", "TRIG", "HDL", "CHOLHDL", "VLDL", "LDLCALC" in the last 168 hours. HgbA1c: pending Urine Drug Screen:  Recent Labs  Lab 08/24/22 2251  LABOPIA NONE DETECTED  COCAINSCRNUR NONE DETECTED  LABBENZ NONE DETECTED  AMPHETMU NONE DETECTED  THCU POSITIVE*  LABBARB NONE DETECTED    Alcohol Level No results for input(s): "ETH" in the last 168 hours.  IMAGING past 24 hours DG CHEST PORT 1 VIEW  Result Date: 08/25/2022 CLINICAL DATA:  Transient ischemic attack. EXAM: PORTABLE CHEST 1 VIEW COMPARISON:  11/20/2021 FINDINGS: Prior median sternotomy. Normal heart size with stable mediastinal contours. No acute airspace disease. No pulmonary edema, pleural effusion or pneumothorax. IMPRESSION:  No acute abnormality. Electronically Signed   By: Keith Rake M.D.   On: 08/25/2022 01:56   MR BRAIN WO CONTRAST  Result Date: 08/25/2022 CLINICAL DATA:  Left-sided facial numbness, left arm and leg numbness and tingling EXAM: MRI HEAD WITHOUT CONTRAST TECHNIQUE: Multiplanar, multiecho pulse sequences of the brain and surrounding structures were obtained without intravenous contrast. COMPARISON:  01/03/2020 MRI head, correlation is also made with CT head 08/24/2022 FINDINGS: Brain: No restricted diffusion to suggest acute or subacute infarct.No acute hemorrhage, mass, mass effect, or midline shift. No hydrocephalus or extra-axial collection.Punctate chronic microhemorrhages are again noted in the bilateral cerebral hemispheres.Partial empty sella. Normal craniocervical junction. Vascular: Normal arterial flow voids. Skull and upper cervical spine: Normal marrow signal. Sinuses/Orbits: Polypoid mucosal thickening in the right greater than left maxillary sinus. Mucous retention cyst in the right sphenoid sinus. Mucosal thickening in the ethmoid air cells and inferior left frontal sinus.Status post right lens replacement. Other: The mastoid air cells are well aerated. IMPRESSION: No acute intracranial process. No evidence of acute or subacute infarct.  Electronically Signed   By: Merilyn Baba M.D.   On: 08/25/2022 01:33   CT ANGIO HEAD NECK W WO CM  Result Date: 08/25/2022 CLINICAL DATA:  Left arm and leg weakness EXAM: CT ANGIOGRAPHY HEAD AND NECK TECHNIQUE: Multidetector CT imaging of the head and neck was performed using the standard protocol during bolus administration of intravenous contrast. Multiplanar CT image reconstructions and MIPs were obtained to evaluate the vascular anatomy. Carotid stenosis measurements (when applicable) are obtained utilizing NASCET criteria, using the distal internal carotid diameter as the denominator. RADIATION DOSE REDUCTION: This exam was performed according to the  departmental dose-optimization program which includes automated exposure control, adjustment of the mA and/or kV according to patient size and/or use of iterative reconstruction technique. CONTRAST:  62m OMNIPAQUE IOHEXOL 350 MG/ML SOLN COMPARISON:  No prior CTA head neck available, correlation is made with 08/24/2022 CT head FINDINGS: CT HEAD FINDINGS For noncontrast findings, please see same day CT head. CTA NECK FINDINGS Aortic arch: Two-vessel arch with a common origin of the brachiocephalic and left common carotid arteries. Imaged portion shows no evidence of aneurysm or dissection. No significant stenosis of the major arch vessel origins. Aortic atherosclerosis peer Right carotid system: No evidence of dissection, occlusion, or hemodynamically significant stenosis (greater than 50%). Atherosclerotic disease at the bifurcation and in the proximal ICA is not hemodynamically significant. Left carotid system: 50% stenosis in the proximal left ICA, secondary to calcified and noncalcified plaque (series 7, images 211 and 216). No other hemodynamically significant stenosis. No dissection or occlusion. Vertebral arteries: No evidence of dissection, occlusion, or hemodynamically significant stenosis (greater than 50%). Skeleton: Degenerative changes in the cervical spine. No acute osseous abnormality Other neck: Negative. Upper chest: No focal pulmonary opacity or pleural effusion. Review of the MIP images confirms the above findings CTA HEAD FINDINGS Anterior circulation: Both internal carotid arteries are patent to the termini, mild stenosis in the bilateral cavernous segments. A1 segments patent. Normal anterior communicating artery. Anterior cerebral arteries are patent to their distal aspects. No M1 stenosis or occlusion. MCA branches perfused and symmetric. Posterior circulation: Vertebral arteries patent to the vertebrobasilar junction without stenosis. Posterior inferior cerebellar arteries patent proximally.  Basilar patent to its distal aspect. Superior cerebellar arteries patent proximally. Patent P1 segments. PCAs perfused to their distal aspects without stenosis. The bilateral posterior communicating arteries are not visualized. Venous sinuses: As permitted by contrast timing, patent. Anatomic variants: None significant. Review of the MIP images confirms the above findings IMPRESSION: 1. 50% stenosis in the proximal left ICA, secondary to calcified and noncalcified plaque. 2. No intracranial large vessel occlusion or significant stenosis. 3. No hemodynamically significant stenosis in the right carotid artery or vertebral arteries. 4. Aortic atherosclerosis. Aortic Atherosclerosis (ICD10-I70.0). Electronically Signed   By: AMerilyn BabaM.D.   On: 08/25/2022 01:05   CT HEAD WO CONTRAST  Result Date: 08/24/2022 CLINICAL DATA:  Left arm and leg weakness, initial encounter EXAM: CT HEAD WITHOUT CONTRAST TECHNIQUE: Contiguous axial images were obtained from the base of the skull through the vertex without intravenous contrast. RADIATION DOSE REDUCTION: This exam was performed according to the departmental dose-optimization program which includes automated exposure control, adjustment of the mA and/or kV according to patient size and/or use of iterative reconstruction technique. COMPARISON:  01/03/2020 FINDINGS: Brain: No evidence of acute infarction, hemorrhage, hydrocephalus, extra-axial collection or mass lesion/mass effect. Vascular: No hyperdense vessel or unexpected calcification. Skull: Normal. Negative for fracture or focal lesion. Sinuses/Orbits: Mucosal thickening and retention cysts  are noted within the maxillary antra bilaterally. This has increased in the interval from the prior exam. Postsurgical changes in the globes are seen stable from the prior exam. Other: None IMPRESSION: No acute intracranial abnormality noted. Mucosal changes within the paranasal sinuses as described. Electronically Signed   By:  Inez Catalina M.D.   On: 08/24/2022 22:58    PHYSICAL EXAM General: Appears well-developed . Psych: Affect appropriate to situation Eyes: No scleral injection HENT: No OP obstrucion Head: Normocephalic.  Cardiovascular: Normal rate and regular rhythm.  Respiratory: Effort normal and breath sounds normal to anterior ascultation GI: Soft.  No distension. There is no tenderness.  Skin: WDI    Neurological Examination Mental Status: Alert, oriented, thought content appropriate.  Speech fluent without evidence of aphasia. Able to follow 3 step commands without difficulty. Cranial Nerves: II: Visual fields grossly normal,  III,IV, VI: ptosis not present, extra-ocular motions intact bilaterally, pupils equal, round, reactive to light and accommodation V,VII: smile symmetric, facial light touch sensation normal bilaterally VIII: hearing normal bilaterally IX,X: uvula rises symmetrically XI: bilateral shoulder shrug XII: midline tongue extension Motor: Right : Upper extremity   5/5    Left:     Upper extremity   5/5  Lower extremity   5/5     Lower extremity   5/5 Tone and bulk:normal tone throughout; no atrophy noted Sensory: Pinprick and light touch intact throughout, bilaterally Deep Tendon Reflexes: 2+ and symmetric throughout Plantars: Right: downgoing   Left: downgoing Cerebellar: normal finger-to-nose, normal rapid alternating movements and normal heel-to-shin test Gait: normal gait and station  ASSESSMENT/PLAN Mr. Eric Saunders is a 66 y.o. male with history of multiple stroke risk factors including hypertension, hyperlipidemia, CAD, current smoker who presents with an episode of coughing fit followed by left-sided numbness involving left face, left arm and left leg along with significant incoordination and almost limp in his left leg.  This went on for 30 minutes and then started easing up. MRI is neg for acute stroke. Overall, presentation is most concerning for a  TIA.  Convulsive cough syncope / pre-syncope  History of cough syncope with left arm jerking since 2015.  Workup with cardiology has been negative. No sign or symptoms of bow hunter syndrome CT head No acute abnormality. Small vessel disease.  CTA head & neck: No LVO or significant stenosis; Aortic atherosclerosis. ~04% prox LICA stenosis with soft and calcified plaque. No dissection.  MRI  No acute finding; small vessel disease MRI C spine Multilevel degenerative changes without evidence of high grade spinal canal or neuroforaminal stenosis 2D Echo EF 55 to 60% CUS left ICA 60 to 79% stenosis LDL 90 HgbA1c 5.9 VTE prophylaxis -SCDs aspirin 81 mg daily prior to admission, now on aspirin 81 mg daily and Plavix '75mg'$  x 3wks, then Plavix '75mg'$  alone Therapy recommendations:  pending Disposition:  pending  Left carotid stenosis Asymptomatic CTA head and neck about 50% proximal L ICA stenosis Carotid Doppler left ICA 60 to 79% stenosis Recommend follow-up with vascular surgery as outpatient  Hypertension Home meds:  Norvasc, Catapres, hydrodiuril, Cozaar, Toprol Stable Long-term BP goal normotensive  Hyperlipidemia Home meds:  Crestor '40mg'$ , resumed in hospital LDL 90, goal < 70 Continue home fenofibrate 160 Continue statin at discharge  Tobacco abuse Current smoker Smoking cessation counseling provided Nicotine patch provided Pt is willing to quit  Other Stroke Risk Factors Substance abuse - UDS:  THC POSITIVE. Patient advised to stop using due to stroke risk. Family hx  stroke  Coronary artery disease status post CABG Migraines  Other Active Problems GERD  Hospital day # 0  Neurology will sign off. Please call with questions. Pt will follow up with stroke clinic NP at [facility] in about 4 weeks. Thanks for the consult.  Rosalin Hawking, MD PhD Stroke Neurology 08/25/2022 7:48 PM    To contact Stroke Continuity provider, please refer to [email or web address] After hours, contact  General Neurology

## 2022-08-25 NOTE — Assessment & Plan Note (Signed)
For now continue Crestor 40 mg p.o. daily Check lipid panel adjust as needed

## 2022-08-25 NOTE — ED Notes (Signed)
Up to b/r, steady gait 

## 2022-08-25 NOTE — ED Notes (Addendum)
Admitting MD into room for update of results, and discuss plan for d/c. Pt getting up to b/r. Steady gait. Alert, NAD, calm, interactive, no changes. VSS.

## 2022-08-25 NOTE — ED Notes (Signed)
Pt ambulated to the restroom and back.

## 2022-08-25 NOTE — ED Notes (Signed)
Back from MRI, alert, NAD, calm, interactive, resps e/u, denies sx or complaints, denies changes.

## 2022-08-25 NOTE — Assessment & Plan Note (Signed)
Chronic stable continue aspirin and statin hold off on beta-blocker for tonight resume when able

## 2022-08-25 NOTE — Discharge Summary (Signed)
Physician Discharge Summary  CAMARA ROSANDER EYC:[id number] DOB: Dec 05, 1955 DOA: 08/24/2022  PCP: [name], FNP  Admit date: 08/24/2022 Discharge date: 08/25/2022  Admitted From: Home Disposition: Home  Recommendations for Outpatient Follow-up:  Follow up with PCP in 1-2 weeks Discharging on aspirin and Plavix for 3 weeks followed by aspirin alone Referral to vascular surgery outpatient for continued surveillance of ICA stenosis Continue to encourage tobacco cessation  Home Health: No Equipment/Devices: None  Discharge Condition: Stable CODE STATUS: Full code Diet recommendation: Heart healthy diet  History of present illness:  Eric Saunders is a 66 y.o. male with past medical history significant for essential hypertension, anxiety/depression, HLD, CAD s/p CABG, tobacco use disorder who presented to [facility] ED on 12/9 via EMS with complaint of left arm and leg weakness.  He reports had a coughing spell and shortly thereafter started developing left arm and left lower extremity weakness and paresthesias; also with reported rhythmic movements.  He reports occasional rhythmic movement of his left upper extremity but now notes similar pattern to his left lower extremity which is new.  Symptoms self resolved after 30 minutes.  Daughter was present during this episode and was concerned and called 911.  No other complaints or concerns at this time.   In the ED, temperature 98.2 F, HR 69, RR 15, BP 164/76, SpO2 96% on room air.  WBC 8.7, hemoglobin 14.4, platelets 289.  Sodium 137, potassium 4.0, chloride 103, CO2 22, glucose 80, BUN 20, creatinine 1.10.  AST 25, ALT 21, total bilirubin 1.1.  Urinalysis unrevealing.  UDS positive for THC.  Chest x-ray with no acute abnormality.  CT head without contrast with no acute intracranial normality.  CT angiogram head with 50% stenosis proximal left ICA secondary to calcified and noncalcified plaque, no intracranial large vessel occlusion or  significant stenosis, no hemodynamically significant stenosis in the right carotid artery or vertebral arteries.  Neurology was consulted and recommended stroke workup.  TRH consulted for further evaluation and management of TIA versus CVA  Hospital course:  TIA Hx cough intermittent syncope Patient presenting to the ED with transient left upper/lower extremity weakness that self resolved after 30 minutes.  Patient with significant stroke risk factors such as HTN, HLD and continued tobacco use.  CT head without contrast with no acute intracranial findings.  MR brain with no intracranial process, no evidence of acute or subacute infarct.  CT angiogram head/neck notable for 50% stenosis proximal left ICA otherwise no intracranial large vessel occlusion or significant stenosis.  LDL 74.  MR C-spine with no acute abnormalities cervical spine.  Neurology was consulted and followed during hospital course.  Discussed extensively by multiple providers regarding risk reduction with tobacco cessation.  Continue aspirin 81 mg p.o. daily and Plavix 75 mg p.o. daily for 3 weeks followed by aspirin alone.   Proximal left ICA stenosis CT angiogram head/neck notable for 50% stenosis proximal left ICA.  Will need outpatient follow-up with vascular surgery.   Essential hypertension Home regimen includes hydrochlorothiazide 25 mg p.o. daily, losartan 100 mg p.o. daily, metoprolol succinate 25 mg p.o. daily, clonidine 0.1 mg p.o. twice daily   CAD s/p CABG Hyperlipidemia Follows with cardiology outpatient, Dr. Percival Spanish.  Continue Crestor 40 mg p.o. daily, fenofibrate 160 mg p.o. daily. Continue aspirin and Plavix as above.   Anxiety/depression Continue Lexapro 40 mg p.o. daily, Xanax 0.5 mg p.o. twice daily as needed   GERD: Continue PPI   Tobacco use disorder Counseled on need for complete  cessation.  Discharge Diagnoses:  Principal Problem:   TIA (transient ischemic attack) Active Problems:    Hyperlipidemia with target LDL less than 100   Generalized anxiety disorder   Coronary arteriosclerosis   Tobacco abuse   Essential hypertension   Chronic obstructive lung disease [facility])    Discharge Instructions  Discharge Instructions     Ambulatory referral to Vascular Surgery   Complete by: As directed    Call MD for:  difficulty breathing, headache or visual disturbances   Complete by: As directed    Call MD for:  extreme fatigue   Complete by: As directed    Call MD for:  persistant dizziness or light-headedness   Complete by: As directed    Call MD for:  persistant nausea and vomiting   Complete by: As directed    Call MD for:  severe uncontrolled pain   Complete by: As directed    Call MD for:  temperature >100.4   Complete by: As directed    Diet - low sodium heart healthy   Complete by: As directed    Increase activity slowly   Complete by: As directed       Allergies as of 08/25/2022       Reactions   Ace Inhibitors Cough        Medication List     TAKE these medications    ALPRAZolam 0.5 MG tablet Commonly known as: Xanax Take 1 tablet (0.5 mg total) by mouth 2 (two) times daily as needed for anxiety.   amLODipine 10 MG tablet Commonly known as: NORVASC Take 1 tablet (10 mg total) by mouth daily.   aspirin 81 MG tablet Take 81 mg by mouth daily.   benzonatate 100 MG capsule Commonly known as: TESSALON TAKE 1 CAPSULE BY MOUTH THREE TIMES A DAY AS NEEDED FOR COUGH What changed:  how much to take how to take this when to take this reasons to take this additional instructions   cloNIDine 0.1 MG tablet Commonly known as: CATAPRES Take 1 tablet (0.1 mg total) by mouth 2 (two) times daily.   clopidogrel 75 MG tablet Commonly known as: PLAVIX Take 1 tablet (75 mg total) by mouth daily for 21 days. Start taking on: August 26, 2022   cyclobenzaprine 10 MG tablet Commonly known as: FLEXERIL Take 1 tablet (10 mg total) by mouth 2 (two)  times daily as needed.   escitalopram 20 MG tablet Commonly known as: Lexapro Take 2 tablets (40 mg total) by mouth daily.   fenofibrate 160 MG tablet Take 1 tablet (160 mg total) by mouth daily.   hydrochlorothiazide 25 MG tablet Commonly known as: HYDRODIURIL Take 1 tablet (25 mg total) by mouth daily.   ibuprofen 200 MG tablet Commonly known as: ADVIL Take 800 mg by mouth every 8 (eight) hours as needed for moderate pain (for knee or back pain).   losartan 100 MG tablet Commonly known as: COZAAR Take 1 tablet (100 mg total) by mouth daily.   metoprolol succinate 25 MG 24 hr tablet Commonly known as: TOPROL-XL Take 1 tablet (25 mg total) by mouth daily.   omeprazole 40 MG capsule Commonly known as: PRILOSEC Take 1 capsule (40 mg total) by mouth 2 (two) times daily.   rosuvastatin 40 MG tablet Commonly known as: CRESTOR Take 1 tablet (40 mg total) by mouth daily.   triamcinolone cream 0.1 % Commonly known as: KENALOG Apply 1 application topically 2 (two) times daily. What changed:  when to  take this reasons to take this   Viagra 100 MG tablet Generic drug: sildenafil Take 100 mg by mouth as needed for erectile dysfunction.        Follow-up Information     [name], FNP. Schedule an appointment as soon as possible for a visit in 1 week(s).   Specialty: Family Medicine Contact information: [address] [place] [address] [phone]                Allergies  Allergen Reactions   Ace Inhibitors Cough    Consultations: Neurology   Procedures/Studies: VAS US CAROTID  Result Date: 08/25/2022 Carotid Arterial Duplex Study Patient Name:  ZIGMUND LINSE  Date of Exam:   08/25/2022 Medical Rec #: [id number]       Accession #:    [id number] Date of Birth: 1956/04/04       Patient Gender: M Patient Age:   66 years Exam Location:  [facility] Procedure:      VAS US CAROTID Referring Phys: York Cerise METZGER-CIHELKA  --------------------------------------------------------------------------------  Indications:       Numbness, Weakness, and a Fall after a coughing fit. CTA of                    neck indicates 50% stenosis proximal left ICA Risk Factors:      Hypertension, hyperlipidemia, current smoker. Other Factors:     COPD, non compliant with inhalers. Limitations        Today's exam was limited due to Snoring. Comparison Study:  Prior carotid duplex indicating 1-39% ICA stenosis,                    bilaterally done 01/04/20 Performing Technologist: Sharion Dove RVS  Examination Guidelines: A complete evaluation includes B-mode imaging, spectral Doppler, color Doppler, and power Doppler as needed of all accessible portions of each vessel. Bilateral testing is considered an integral part of a complete examination. Limited examinations for reoccurring indications may be performed as noted.  Right Carotid Findings: +----------+--------+--------+--------+------------------+------------------+           PSV cm/sEDV cm/sStenosisPlaque DescriptionComments           +----------+--------+--------+--------+------------------+------------------+ CCA Prox  99      16                                intimal thickening +----------+--------+--------+--------+------------------+------------------+ CCA Distal80      18                                intimal thickening +----------+--------+--------+--------+------------------+------------------+ ICA Prox  111     29      1-39%   calcific          Shadowing          +----------+--------+--------+--------+------------------+------------------+ ICA Mid   97      15                                                   +----------+--------+--------+--------+------------------+------------------+ ICA Distal76      24                                                   +----------+--------+--------+--------+------------------+------------------+  ECA       115      14                                                   +----------+--------+--------+--------+------------------+------------------+ +----------+--------+-------+--------+-------------------+           PSV cm/sEDV cmsDescribeArm Pressure (mmHG) +----------+--------+-------+--------+-------------------+ [id number]                                        +----------+--------+-------+--------+-------------------+ +---------+--------+--+--------+--+ VertebralPSV cm/s33EDV cm/s10 +---------+--------+--+--------+--+  Left Carotid Findings: +----------+--------+--------+--------+------------------+------------------+           PSV cm/sEDV cm/sStenosisPlaque DescriptionComments           +----------+--------+--------+--------+------------------+------------------+ CCA Prox  80      20                                intimal thickening +----------+--------+--------+--------+------------------+------------------+ CCA Distal77      20                                intimal thickening +----------+--------+--------+--------+------------------+------------------+ ICA Prox  241     74      60-79%  calcific                             +----------+--------+--------+--------+------------------+------------------+ ICA Mid   154     36                                                   +----------+--------+--------+--------+------------------+------------------+ ICA Distal101     29                                                   +----------+--------+--------+--------+------------------+------------------+ ECA       135     17                                                   +----------+--------+--------+--------+------------------+------------------+ +----------+--------+--------+--------+-------------------+           PSV cm/sEDV cm/sDescribeArm Pressure (mmHG) +----------+--------+--------+--------+-------------------+ Subclavian214                                          +----------+--------+--------+--------+-------------------+ +---------+--------+--+--------+--+ VertebralPSV cm/s50EDV cm/s12 +---------+--------+--+--------+--+   Summary: Right Carotid: Velocities in the right ICA are consistent with a 1-39% stenosis. Left Carotid: Velocities in the left ICA are consistent with a 60-79% stenosis.               Significant increase in degree of stenosis and plaque as compared  to previous exam done 01/04/20. Previously noted to be at 1-39%/               Patient now symptomatic at 60-79% stenosis.  *See table(s) above for measurements and observations.     Preliminary    ECHOCARDIOGRAM COMPLETE  Result Date: 08/25/2022    ECHOCARDIOGRAM REPORT   Patient Name:   GRAYSEN WOODYARD Date of Exam: 08/25/2022 Medical Rec #:  [id number]      Height:       68.0 in Accession #:    [id number]     Weight:       185.0 lb Date of Birth:  03-Oct-1955      BSA:          1.977 m Patient Age:    32 years       BP:           135/76 mmHg Patient Gender: M              HR:           70 bpm. Exam Location:  Inpatient Procedure: 2D Echo, Cardiac Doppler and Color Doppler Indications:    TIA  History:        Patient has prior history of Echocardiogram examinations, most                 recent 01/04/2020. CAD, Prior CABG, TIA and COPD; Risk                 Factors:Hypertension, Dyslipidemia and Current Smoker.  Sonographer:    Wenda Low Referring Phys: [id number] ANASTASSIA DOUTOVA  Sonographer Comments: Image acquisition challenging due to COPD. IMPRESSIONS  1. Left ventricular ejection fraction, by estimation, is 55 to 60%. The left ventricle has normal function. The left ventricle has no regional wall motion abnormalities. Left ventricular diastolic parameters were normal.  2. Right ventricular systolic function is normal. The right ventricular size is normal. Tricuspid regurgitation signal is inadequate for assessing PA pressure.  3. The mitral valve is  grossly normal. Trivial mitral valve regurgitation. No evidence of mitral stenosis.  4. The aortic valve is tricuspid. There is mild calcification of the aortic valve. There is mild thickening of the aortic valve. Aortic valve regurgitation is not visualized. Aortic valve sclerosis is present, with no evidence of aortic valve stenosis. Comparison(s): No significant change from prior study. Conclusion(s)/Recommendation(s): No intracardiac source of embolism detected on this transthoracic study. Consider a transesophageal echocardiogram to exclude cardiac source of embolism if clinically indicated. FINDINGS  Left Ventricle: Left ventricular ejection fraction, by estimation, is 55 to 60%. The left ventricle has normal function. The left ventricle has no regional wall motion abnormalities. The left ventricular internal cavity size was normal in size. There is  no left ventricular hypertrophy. Left ventricular diastolic parameters were normal. Right Ventricle: The right ventricular size is normal. No increase in right ventricular wall thickness. Right ventricular systolic function is normal. Tricuspid regurgitation signal is inadequate for assessing PA pressure. Left Atrium: Left atrial size was normal in size. Right Atrium: Right atrial size was normal in size. Pericardium: There is no evidence of pericardial effusion. Mitral Valve: The mitral valve is grossly normal. Trivial mitral valve regurgitation. No evidence of mitral valve stenosis. MV peak gradient, 2.9 mmHg. The mean mitral valve gradient is 1.0 mmHg. Tricuspid Valve: The tricuspid valve is grossly normal. Tricuspid valve regurgitation is trivial. No evidence of tricuspid stenosis. Aortic Valve: The aortic valve is tricuspid. There is  mild calcification of the aortic valve. There is mild thickening of the aortic valve. Aortic valve regurgitation is not visualized. Aortic valve sclerosis is present, with no evidence of aortic valve stenosis. Aortic valve mean  gradient measures 3.0 mmHg. Aortic valve peak gradient measures 5.5 mmHg. Aortic valve area, by VTI measures 2.92 cm. Pulmonic Valve: The pulmonic valve was grossly normal. Pulmonic valve regurgitation is not visualized. No evidence of pulmonic stenosis. Aorta: The aortic root and ascending aorta are structurally normal, with no evidence of dilitation. Venous: The inferior vena cava was not well visualized. IAS/Shunts: The atrial septum is grossly normal.  LEFT VENTRICLE PLAX 2D LVIDd:         5.10 cm   Diastology LVIDs:         2.90 cm   LV e' medial:    7.29 cm/s LV PW:         0.70 cm   LV E/e' medial:  10.4 LV IVS:        1.10 cm   LV e' lateral:   9.03 cm/s LVOT diam:     2.10 cm   LV E/e' lateral: 8.4 LV SV:         76 LV SV Index:   38 LVOT Area:     3.46 cm  RIGHT VENTRICLE RV Basal diam:  3.90 cm TAPSE (M-mode): 1.9 cm LEFT ATRIUM             Index        RIGHT ATRIUM           Index LA diam:        3.60 cm 1.82 cm/m   RA Area:     18.10 cm LA Vol (A2C):   39.1 ml 19.78 ml/m  RA Volume:   46.00 ml  23.27 ml/m LA Vol (A4C):   32.0 ml 16.19 ml/m LA Biplane Vol: 37.5 ml 18.97 ml/m  AORTIC VALVE                    PULMONIC VALVE AV Area (Vmax):    2.71 cm     PV Vmax:       1.02 m/s AV Area (Vmean):   2.85 cm     PV Peak grad:  4.2 mmHg AV Area (VTI):     2.92 cm AV Vmax:           117.00 cm/s AV Vmean:          76.200 cm/s AV VTI:            0.259 m AV Peak Grad:      5.5 mmHg AV Mean Grad:      3.0 mmHg LVOT Vmax:         91.40 cm/s LVOT Vmean:        62.600 cm/s LVOT VTI:          0.218 m LVOT/AV VTI ratio: 0.84  AORTA Ao Root diam: 3.10 cm MITRAL VALVE MV Area (PHT): 3.70 cm    SHUNTS MV Area VTI:   2.44 cm    Systemic VTI:  0.22 m MV Peak grad:  2.9 mmHg    Systemic Diam: 2.10 cm MV Mean grad:  1.0 mmHg MV Vmax:       0.85 m/s MV Vmean:      44.7 cm/s MV Decel Time: 205 msec MV E velocity: 75.80 cm/s MV A velocity: 86.50 cm/s MV E/A ratio:  0.88 Eleonore Chiquito MD Electronically signed by  Eleonore Chiquito MD Signature Date/Time: 08/25/2022/4:27:01 PM    Final    MR CERVICAL SPINE WO CONTRAST  Result Date: 08/25/2022 CLINICAL DATA:  Weakness. EXAM: MRI CERVICAL SPINE WITHOUT CONTRAST TECHNIQUE: Multiplanar, multisequence MR imaging of the cervical spine was performed. No intravenous contrast was administered. COMPARISON:  CT C Spine 01/03/20, MRI Brain 01/03/20. FINDINGS: Alignment: Physiologic. Vertebrae: No fracture, evidence of discitis, or bone lesion. There is T2/stir hyperintense signal at the dens that extends to the base of the dens. The appearance of the dens and C2 vertebral body is unchanged compared to MRI brain dated 01/03/2020 on T1 weighted imaging. There is new T2 hyperintense signal at the based on (series 7, image 7), which is new compared MRI brain dated 01/03/2020. T2 hyperintense signal also extends into the right occipital condyle (series 5, image 2). Overall these findings are favored to represent progressive degenerative change at the craniocervical junction. Cord: Normal signal and morphology. Posterior Fossa, vertebral arteries, paraspinal tissues: Negative. Disc levels: C1-C2: Moderate degenerative changes, particularly at C1-C2 articulation at the craniocervical junction. These have progressed compared to 01/03/2020. C2-C3: No significant disc bulge. Moderate to severe left and moderate right facet degenerative change. Moderate bilateral neural foraminal stenosis. C3-C4: No significant disc bulge. Severe right and mild left facet degenerative change. Uncovertebral hypertrophy. Severe right-sided neural foraminal stenosis. C4-C5: No significant disc bulge. Uncovertebral hypertrophy. Severe right facet degenerative change and moderate left facet degenerative change. Moderate to severe right neural foraminal stenosis. C5-C6: No significant disc bulge. No spinal canal stenosis. Uncovertebral hypertrophy. Moderate to severe right neural foraminal stenosis. Moderate left neural  foraminal stenosis. C6-C7: Minimal disc bulge. Moderate spinal canal narrowing. Uncovertebral hypertrophy. Moderate bilateral neural foraminal stenosis. C7-T1: No significant disc bulge. No spinal canal stenosis. Mild bilateral facet degenerative change. No neural foraminal stenosis. IMPRESSION: 1. No acute abnormality in the cervical spine. 2. Multilevel degenerative changes without evidence of high grade spinal canal or neuroforaminal stenosis, as above. 3. Compared to prior exam there is increased T2 signal in the basion and the occipital condyles, as well as the dense. These findings are favored to reactive in the setting of progressive degenerative change. Electronically Signed   By: Marin Roberts M.D.   On: 08/25/2022 15:06   DG CHEST PORT 1 VIEW  Result Date: 08/25/2022 CLINICAL DATA:  Transient ischemic attack. EXAM: PORTABLE CHEST 1 VIEW COMPARISON:  11/20/2021 FINDINGS: Prior median sternotomy. Normal heart size with stable mediastinal contours. No acute airspace disease. No pulmonary edema, pleural effusion or pneumothorax. IMPRESSION: No acute abnormality. Electronically Signed   By: Keith Rake M.D.   On: 08/25/2022 01:56   MR BRAIN WO CONTRAST  Result Date: 08/25/2022 CLINICAL DATA:  Left-sided facial numbness, left arm and leg numbness and tingling EXAM: MRI HEAD WITHOUT CONTRAST TECHNIQUE: Multiplanar, multiecho pulse sequences of the brain and surrounding structures were obtained without intravenous contrast. COMPARISON:  01/03/2020 MRI head, correlation is also made with CT head 08/24/2022 FINDINGS: Brain: No restricted diffusion to suggest acute or subacute infarct.No acute hemorrhage, mass, mass effect, or midline shift. No hydrocephalus or extra-axial collection.Punctate chronic microhemorrhages are again noted in the bilateral cerebral hemispheres.Partial empty sella. Normal craniocervical junction. Vascular: Normal arterial flow voids. Skull and upper cervical spine: Normal  marrow signal. Sinuses/Orbits: Polypoid mucosal thickening in the right greater than left maxillary sinus. Mucous retention cyst in the right sphenoid sinus. Mucosal thickening in the ethmoid air cells and inferior left frontal sinus.Status post right lens replacement. Other: The mastoid  air cells are well aerated. IMPRESSION: No acute intracranial process. No evidence of acute or subacute infarct. Electronically Signed   By: Merilyn Baba M.D.   On: 08/25/2022 01:33   CT ANGIO HEAD NECK W WO CM  Result Date: 08/25/2022 CLINICAL DATA:  Left arm and leg weakness EXAM: CT ANGIOGRAPHY HEAD AND NECK TECHNIQUE: Multidetector CT imaging of the head and neck was performed using the standard protocol during bolus administration of intravenous contrast. Multiplanar CT image reconstructions and MIPs were obtained to evaluate the vascular anatomy. Carotid stenosis measurements (when applicable) are obtained utilizing NASCET criteria, using the distal internal carotid diameter as the denominator. RADIATION DOSE REDUCTION: This exam was performed according to the departmental dose-optimization program which includes automated exposure control, adjustment of the mA and/or kV according to patient size and/or use of iterative reconstruction technique. CONTRAST:  53m OMNIPAQUE IOHEXOL 350 MG/ML SOLN COMPARISON:  No prior CTA head neck available, correlation is made with 08/24/2022 CT head FINDINGS: CT HEAD FINDINGS For noncontrast findings, please see same day CT head. CTA NECK FINDINGS Aortic arch: Two-vessel arch with a common origin of the brachiocephalic and left common carotid arteries. Imaged portion shows no evidence of aneurysm or dissection. No significant stenosis of the major arch vessel origins. Aortic atherosclerosis peer Right carotid system: No evidence of dissection, occlusion, or hemodynamically significant stenosis (greater than 50%). Atherosclerotic disease at the bifurcation and in the proximal ICA is not  hemodynamically significant. Left carotid system: 50% stenosis in the proximal left ICA, secondary to calcified and noncalcified plaque (series 7, images 211 and 216). No other hemodynamically significant stenosis. No dissection or occlusion. Vertebral arteries: No evidence of dissection, occlusion, or hemodynamically significant stenosis (greater than 50%). Skeleton: Degenerative changes in the cervical spine. No acute osseous abnormality Other neck: Negative. Upper chest: No focal pulmonary opacity or pleural effusion. Review of the MIP images confirms the above findings CTA HEAD FINDINGS Anterior circulation: Both internal carotid arteries are patent to the termini, mild stenosis in the bilateral cavernous segments. A1 segments patent. Normal anterior communicating artery. Anterior cerebral arteries are patent to their distal aspects. No M1 stenosis or occlusion. MCA branches perfused and symmetric. Posterior circulation: Vertebral arteries patent to the vertebrobasilar junction without stenosis. Posterior inferior cerebellar arteries patent proximally. Basilar patent to its distal aspect. Superior cerebellar arteries patent proximally. Patent P1 segments. PCAs perfused to their distal aspects without stenosis. The bilateral posterior communicating arteries are not visualized. Venous sinuses: As permitted by contrast timing, patent. Anatomic variants: None significant. Review of the MIP images confirms the above findings IMPRESSION: 1. 50% stenosis in the proximal left ICA, secondary to calcified and noncalcified plaque. 2. No intracranial large vessel occlusion or significant stenosis. 3. No hemodynamically significant stenosis in the right carotid artery or vertebral arteries. 4. Aortic atherosclerosis. Aortic Atherosclerosis (ICD10-I70.0). Electronically Signed   By: AMerilyn BabaM.D.   On: 08/25/2022 01:05   CT HEAD WO CONTRAST  Result Date: 08/24/2022 CLINICAL DATA:  Left arm and leg weakness, initial  encounter EXAM: CT HEAD WITHOUT CONTRAST TECHNIQUE: Contiguous axial images were obtained from the base of the skull through the vertex without intravenous contrast. RADIATION DOSE REDUCTION: This exam was performed according to the departmental dose-optimization program which includes automated exposure control, adjustment of the mA and/or kV according to patient size and/or use of iterative reconstruction technique. COMPARISON:  01/03/2020 FINDINGS: Brain: No evidence of acute infarction, hemorrhage, hydrocephalus, extra-axial collection or mass lesion/mass effect. Vascular: No hyperdense vessel  or unexpected calcification. Skull: Normal. Negative for fracture or focal lesion. Sinuses/Orbits: Mucosal thickening and retention cysts are noted within the maxillary antra bilaterally. This has increased in the interval from the prior exam. Postsurgical changes in the globes are seen stable from the prior exam. Other: None IMPRESSION: No acute intracranial abnormality noted. Mucosal changes within the paranasal sinuses as described. Electronically Signed   By: Inez Catalina M.D.   On: 08/24/2022 22:58   CT CHEST LUNG CA SCREEN LOW DOSE W/O CM  Result Date: 08/07/2022 CLINICAL DATA:  Current smoker with 53 pack-year history EXAM: CT CHEST WITHOUT CONTRAST LOW-DOSE FOR LUNG CANCER SCREENING TECHNIQUE: Multidetector CT imaging of the chest was performed following the standard protocol without IV contrast. RADIATION DOSE REDUCTION: This exam was performed according to the departmental dose-optimization program which includes automated exposure control, adjustment of the mA and/or kV according to patient size and/or use of iterative reconstruction technique. COMPARISON:  Cancer screening CT dated December 07, 2020 FINDINGS: Cardiovascular: Normal heart size. No pericardial effusion. Normal caliber thoracic aorta with moderate calcified plaque. 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 Severe  left main and three-vessel coronary artery calcifications status post CABG. Mediastinum/Nodes: Esophagus and thyroid are unremarkable. No pathologically enlarged lymph nodes are seen in the chest. Lungs/Pleura: Central airways are patent. Mild paraseptal and centrilobular emphysema. No consolidation, pleural effusion or pneumothorax. No suspicious pulmonary nodules. Upper Abdomen: No acute abnormality. Musculoskeletal: Sclerotic lesion of the T12 vertebral body, unchanged when compared with prior and likely a bone island. No chest wall mass or suspicious bone lesions identified. IMPRESSION: 1. Lung-RADS 1, negative. Continue annual screening with low-dose chest CT without contrast in 12 months. 2. Aortic Atherosclerosis (ICD10-I70.0) and Emphysema (ICD10-J43.9). Electronically Signed   By: Yetta Glassman M.D.   On: 08/07/2022 17:43     Subjective: Patient seen and examined at bedside, resting comfortably.  Symptoms remain resolved.  Seen by neurology with unremarkable workup.  Discussed need risk reduction with tobacco cessation.  Ready for discharge home.  No other specific complaints or concerns at this time.  Denies headache, no dizziness, no chest pain, no palpitations, no shortness of breath, no abdominal pain, no fever/chills/night sweats, no nausea/vomiting/diarrhea, no focal weakness, no fatigue, no paresthesias.  No acute concerns overnight per nursing staff.  Ready for discharge home.  Discussed needs close outpatient follow-up with PCP and vascular surgery for noted mild ICA stenosis.  Discharge Exam: Vitals:   08/25/22 1525 08/25/22 1700  BP:    Pulse: 67 71  Resp: 18 (!) 22  Temp:    SpO2: 91% 90%   Vitals:   08/25/22 1415 08/25/22 1505 08/25/22 1525 08/25/22 1700  BP:  (!) 143/88    Pulse: 64 73 67 71  Resp: (!) 21  18 (!) 22  Temp:      TempSrc:      SpO2: 91%  91% 90%  Weight:      Height:        Physical Exam: GEN: NAD, alert and oriented x 3, appears older than stated  age HEENT: NCAT, PERRL, EOMI, sclera clear, MMM PULM: CTAB w/o wheezes/crackles, normal respiratory effort, on room air CV: RRR w/o M/G/R GI: abd soft, NTND, NABS, no R/G/M MSK: no peripheral edema, muscle strength globally intact 5/5 bilateral upper/lower extremities NEURO: CN II-XII intact, no focal deficits, sensation to light touch intact PSYCH: normal mood/affect Integumentary: dry/intact, no rashes or wounds    The results of  significant diagnostics from this hospitalization (including imaging, microbiology, ancillary and laboratory) are listed below for reference.     Microbiology: No results found for this or any previous visit (from the past 240 hour(s)).   Labs: BNP (last 3 results) No results for input(s): "BNP" in the last 8760 hours. Basic Metabolic Panel: Recent Labs  Lab 08/24/22 2242 08/24/22 2251 08/25/22 0415  NA 137 135  --   K 4.0 4.6  --   CL 103 104  --   CO2 22  --   --   GLUCOSE 80 80  --   BUN 20 26*  --   CREATININE 1.10 1.00  --   CALCIUM 9.2  --   --   MG  --   --  2.1  PHOS  --   --  4.3   Liver Function Tests: Recent Labs  Lab 08/24/22 2242  AST 25  ALT 21  ALKPHOS 43  BILITOT 1.1  PROT 6.6  ALBUMIN 3.9   No results for input(s): "LIPASE", "AMYLASE" in the last 168 hours. No results for input(s): "AMMONIA" in the last 168 hours. CBC: Recent Labs  Lab 08/24/22 2242 08/24/22 2251  WBC 8.7  --   NEUTROABS 4.8  --   HGB 14.4 13.9  HCT 42.3 41.0  MCV 87.4  --   PLT 289  --    Cardiac Enzymes: Recent Labs  Lab 08/25/22 0415  CKTOTAL 183   BNP: Invalid input(s): "POCBNP" CBG: No results for input(s): "GLUCAP" in the last 168 hours. D-Dimer No results for input(s): "DDIMER" in the last 72 hours. Hgb A1c No results for input(s): "HGBA1C" in the last 72 hours. Lipid Profile Recent Labs    08/25/22 0855  CHOL 134  HDL 34*  LDLCALC 74  TRIG 132  CHOLHDL 3.9   Thyroid function studies Recent Labs     08/25/22 0415  TSH 2.047   Anemia work up No results for input(s): "VITAMINB12", "FOLATE", "FERRITIN", "TIBC", "IRON", "RETICCTPCT" in the last 72 hours. Urinalysis    Component Value Date/Time   COLORURINE COLORLESS (A) 08/24/2022 2252   APPEARANCEUR CLEAR 08/24/2022 2252   APPEARANCEUR Clear 07/25/2021 1540   LABSPEC 1.002 (L) 08/24/2022 2252   PHURINE 6.0 08/24/2022 2252   GLUCOSEU NEGATIVE 08/24/2022 2252   HGBUR NEGATIVE 08/24/2022 2252   BILIRUBINUR NEGATIVE 08/24/2022 2252   BILIRUBINUR Negative 07/25/2021 [place] 08/24/2022 2252   PROTEINUR NEGATIVE 08/24/2022 2252   NITRITE NEGATIVE 08/24/2022 2252   LEUKOCYTESUR NEGATIVE 08/24/2022 2252   Sepsis Labs Recent Labs  Lab 08/24/22 2242  WBC 8.7   Microbiology No results found for this or any previous visit (from the past 240 hour(s)).   Time coordinating discharge: Over 30 minutes  SIGNED:   Reiley Keisler J [place] DO  [facility] 08/25/2022, 6:04 PM

## 2022-08-25 NOTE — Assessment & Plan Note (Signed)
Chronic stable continue to monitor 

## 2022-08-25 NOTE — Assessment & Plan Note (Signed)
Continue home medications encouraged to discontinue marijuana use

## 2022-08-25 NOTE — Progress Notes (Addendum)
PROGRESS NOTE    MINOR IDEN  RJJ:[id number] DOB: 1955/10/22 DOA: 08/24/2022 PCP: [name], FNP    Brief Narrative:   Eric Saunders is a 66 y.o. male with past medical history significant for essential hypertension, anxiety/depression, HLD, CAD s/p CABG, tobacco use disorder who presented to [facility] ED on 12/9 via EMS with complaint of left arm and leg weakness.  He reports had a coughing spell and shortly thereafter started developing left arm and left lower extremity weakness and paresthesias; also with reported rhythmic movements.  He reports occasional rhythmic movement of his left upper extremity but now notes similar pattern to his left lower extremity which is new.  Symptoms self resolved after 30 minutes.  Daughter was present during this episode and was concerned and called 911.  No other complaints or concerns at this time.  In the ED, temperature 98.2 F, HR 69, RR 15, BP 164/76, SpO2 96% on room air.  WBC 8.7, hemoglobin 14.4, platelets 289.  Sodium 137, potassium 4.0, chloride 103, CO2 22, glucose 80, BUN 20, creatinine 1.10.  AST 25, ALT 21, total bilirubin 1.1.  Urinalysis unrevealing.  UDS positive for THC.  Chest x-ray with no acute abnormality.  CT head without contrast with no acute intracranial normality.  CT angiogram head with 50% stenosis proximal left ICA secondary to calcified and noncalcified plaque, no intracranial large vessel occlusion or significant stenosis, no hemodynamically significant stenosis in the right carotid artery or vertebral arteries.  Neurology was consulted and recommended stroke workup.  TRH consulted for further evaluation and management of TIA versus CVA  Assessment & Plan:   TIA Patient presenting to the ED with transient left upper/lower extremity weakness that self resolved after 30 minutes.  Patient with significant stroke risk factors such as HTN, HLD and continued tobacco use.  CT head without contrast with no acute intracranial  findings.  MR brain with no intracranial process, no evidence of acute or subacute infarct.  CT angiogram head/neck notable for 50% stenosis proximal left ICA otherwise no intracranial large vessel occlusion or significant stenosis.  LDL 74. -- Neurology following, appreciate assistance -- TTE: Pending -- Hemoglobin A1c: Pending -- Aspirin 81 mg p.o. daily, Plavix 75 mg p.o. daily -- Continue monitor on telemetry -- PT/OT/SLP evaluation: Pending  Proximal left ICA stenosis CT angiogram head/neck notable for 50% stenosis proximal left ICA.  Will need outpatient follow-up with vascular surgery.  Essential hypertension Home regimen includes hydrochlorothiazide 25 mg p.o. daily, losartan 100 mg p.o. daily, metoprolol succinate 25 mg p.o. daily, clonidine 0.1 mg p.o. twice daily -- Continue to allow permissive hypertension up to 220/110 for 24 hours -- Continue monitor BP closely  CAD s/p CABG Hyperlipidemia Follows with cardiology outpatient, Dr. Percival Spanish. --Crestor 40 mg p.o. daily, fenofibrate 160 mg p.o. daily -- Continue aspirin and Plavix  Anxiety/depression -- Continue Lexapro 40 mg p.o. daily, Xanax 0.5 mg p.o. twice daily as needed  GERD: Continue PPI  Tobacco use disorder Counseled on need for complete cessation. --Nicotine patch   DVT prophylaxis: SCD's Start: 08/25/22 0741    Code Status: Full Code Family Communication: No family present at bedside this morning  Disposition Plan:  Level of care: Telemetry Medical Status is: Observation The patient remains OBS appropriate and will d/c before 2 midnights.    Consultants:  Neurology  Procedures:  TTE: Pending  Antimicrobials:  None   Subjective: Patient seen examined bedside, resting comfortably.  Remains in ED holding area.  Symptoms remain  completely resolved.  Discussed with patient needs complete tobacco cessation as high risk factor for developing stroke.  Awaiting TTE, therapy evaluation and further  neurology recommendations.  Patient hopeful that he could possibly discharge later today as he has to care for his wife who has many medical needs at home.  No other specific complaints or concerns at this time.  Denies headache, no dizziness, no chest pain, no palpitations, no shortness of breath, no abdominal pain, no fever/chills/night sweats, no nausea/vomiting/diarrhea, no focal weakness, no fatigue, no paresthesias.  No acute events overnight per nurse staff.  Objective: Vitals:   08/25/22 0945 08/25/22 1000 08/25/22 1015 08/25/22 1030  BP:  (!) 167/107  (!) 175/97  Pulse: 67 65 68 68  Resp: '17 19 19 '$ (!) 21  Temp:      TempSrc:      SpO2: 92% 91% 91% 95%  Weight:      Height:       No intake or output data in the 24 hours ending 08/25/22 1112 Filed Weights   08/24/22 2204  Weight: 83.9 kg    Examination:  Physical Exam: GEN: NAD, alert and oriented x 3, chronically ill appearance, appears older than stated age 77: NCAT, PERRL, EOMI, sclera clear, MMM PULM: CTAB w/o wheezes/crackles, normal respiratory effort, on room air CV: RRR w/o M/G/R GI: abd soft, NTND, NABS, no R/G/M MSK: no peripheral edema, muscle strength globally intact 5/5 bilateral upper/lower extremities NEURO: CN II-XII intact, no focal deficits, sensation to light touch intact PSYCH: normal mood/affect Integumentary: dry/intact, no rashes or wounds    Data Reviewed: I have personally reviewed following labs and imaging studies  CBC: Recent Labs  Lab 08/24/22 2242 08/24/22 2251  WBC 8.7  --   NEUTROABS 4.8  --   HGB 14.4 13.9  HCT 42.3 41.0  MCV 87.4  --   PLT 289  --    Basic Metabolic Panel: Recent Labs  Lab 08/24/22 2242 08/24/22 2251 08/25/22 0415  NA 137 135  --   K 4.0 4.6  --   CL 103 104  --   CO2 22  --   --   GLUCOSE 80 80  --   BUN 20 26*  --   CREATININE 1.10 1.00  --   CALCIUM 9.2  --   --   MG  --   --  2.1  PHOS  --   --  4.3   GFR: Estimated Creatinine  Clearance: 78.7 mL/min (by C-G formula based on SCr of 1 mg/dL). Liver Function Tests: Recent Labs  Lab 08/24/22 2242  AST 25  ALT 21  ALKPHOS 43  BILITOT 1.1  PROT 6.6  ALBUMIN 3.9   No results for input(s): "LIPASE", "AMYLASE" in the last 168 hours. No results for input(s): "AMMONIA" in the last 168 hours. Coagulation Profile: Recent Labs  Lab 08/24/22 2242  INR 1.1   Cardiac Enzymes: Recent Labs  Lab 08/25/22 0415  CKTOTAL 183   BNP (last 3 results) No results for input(s): "PROBNP" in the last 8760 hours. HbA1C: No results for input(s): "HGBA1C" in the last 72 hours. CBG: No results for input(s): "GLUCAP" in the last 168 hours. Lipid Profile: Recent Labs    08/25/22 0855  CHOL 134  HDL 34*  LDLCALC 74  TRIG 132  CHOLHDL 3.9   Thyroid Function Tests: Recent Labs    08/25/22 0415  TSH 2.047   Anemia Panel: No results for input(s): "VITAMINB12", "FOLATE", "FERRITIN", "TIBC", "  IRON", "RETICCTPCT" in the last 72 hours. Sepsis Labs: No results for input(s): "PROCALCITON", "LATICACIDVEN" in the last 168 hours.  No results found for this or any previous visit (from the past 240 hour(s)).       Radiology Studies: DG CHEST PORT 1 VIEW  Result Date: 08/25/2022 CLINICAL DATA:  Transient ischemic attack. EXAM: PORTABLE CHEST 1 VIEW COMPARISON:  11/20/2021 FINDINGS: Prior median sternotomy. Normal heart size with stable mediastinal contours. No acute airspace disease. No pulmonary edema, pleural effusion or pneumothorax. IMPRESSION: No acute abnormality. Electronically Signed   By: Keith Rake M.D.   On: 08/25/2022 01:56   MR BRAIN WO CONTRAST  Result Date: 08/25/2022 CLINICAL DATA:  Left-sided facial numbness, left arm and leg numbness and tingling EXAM: MRI HEAD WITHOUT CONTRAST TECHNIQUE: Multiplanar, multiecho pulse sequences of the brain and surrounding structures were obtained without intravenous contrast. COMPARISON:  01/03/2020 MRI head,  correlation is also made with CT head 08/24/2022 FINDINGS: Brain: No restricted diffusion to suggest acute or subacute infarct.No acute hemorrhage, mass, mass effect, or midline shift. No hydrocephalus or extra-axial collection.Punctate chronic microhemorrhages are again noted in the bilateral cerebral hemispheres.Partial empty sella. Normal craniocervical junction. Vascular: Normal arterial flow voids. Skull and upper cervical spine: Normal marrow signal. Sinuses/Orbits: Polypoid mucosal thickening in the right greater than left maxillary sinus. Mucous retention cyst in the right sphenoid sinus. Mucosal thickening in the ethmoid air cells and inferior left frontal sinus.Status post right lens replacement. Other: The mastoid air cells are well aerated. IMPRESSION: No acute intracranial process. No evidence of acute or subacute infarct. Electronically Signed   By: Merilyn Baba M.D.   On: 08/25/2022 01:33   CT ANGIO HEAD NECK W WO CM  Result Date: 08/25/2022 CLINICAL DATA:  Left arm and leg weakness EXAM: CT ANGIOGRAPHY HEAD AND NECK TECHNIQUE: Multidetector CT imaging of the head and neck was performed using the standard protocol during bolus administration of intravenous contrast. Multiplanar CT image reconstructions and MIPs were obtained to evaluate the vascular anatomy. Carotid stenosis measurements (when applicable) are obtained utilizing NASCET criteria, using the distal internal carotid diameter as the denominator. RADIATION DOSE REDUCTION: This exam was performed according to the departmental dose-optimization program which includes automated exposure control, adjustment of the mA and/or kV according to patient size and/or use of iterative reconstruction technique. CONTRAST:  37m OMNIPAQUE IOHEXOL 350 MG/ML SOLN COMPARISON:  No prior CTA head neck available, correlation is made with 08/24/2022 CT head FINDINGS: CT HEAD FINDINGS For noncontrast findings, please see same day CT head. CTA NECK FINDINGS  Aortic arch: Two-vessel arch with a common origin of the brachiocephalic and left common carotid arteries. Imaged portion shows no evidence of aneurysm or dissection. No significant stenosis of the major arch vessel origins. Aortic atherosclerosis peer Right carotid system: No evidence of dissection, occlusion, or hemodynamically significant stenosis (greater than 50%). Atherosclerotic disease at the bifurcation and in the proximal ICA is not hemodynamically significant. Left carotid system: 50% stenosis in the proximal left ICA, secondary to calcified and noncalcified plaque (series 7, images 211 and 216). No other hemodynamically significant stenosis. No dissection or occlusion. Vertebral arteries: No evidence of dissection, occlusion, or hemodynamically significant stenosis (greater than 50%). Skeleton: Degenerative changes in the cervical spine. No acute osseous abnormality Other neck: Negative. Upper chest: No focal pulmonary opacity or pleural effusion. Review of the MIP images confirms the above findings CTA HEAD FINDINGS Anterior circulation: Both internal carotid arteries are patent to the termini, mild stenosis in  the bilateral cavernous segments. A1 segments patent. Normal anterior communicating artery. Anterior cerebral arteries are patent to their distal aspects. No M1 stenosis or occlusion. MCA branches perfused and symmetric. Posterior circulation: Vertebral arteries patent to the vertebrobasilar junction without stenosis. Posterior inferior cerebellar arteries patent proximally. Basilar patent to its distal aspect. Superior cerebellar arteries patent proximally. Patent P1 segments. PCAs perfused to their distal aspects without stenosis. The bilateral posterior communicating arteries are not visualized. Venous sinuses: As permitted by contrast timing, patent. Anatomic variants: None significant. Review of the MIP images confirms the above findings IMPRESSION: 1. 50% stenosis in the proximal left ICA,  secondary to calcified and noncalcified plaque. 2. No intracranial large vessel occlusion or significant stenosis. 3. No hemodynamically significant stenosis in the right carotid artery or vertebral arteries. 4. Aortic atherosclerosis. Aortic Atherosclerosis (ICD10-I70.0). Electronically Signed   By: Merilyn Baba M.D.   On: 08/25/2022 01:05   CT HEAD WO CONTRAST  Result Date: 08/24/2022 CLINICAL DATA:  Left arm and leg weakness, initial encounter EXAM: CT HEAD WITHOUT CONTRAST TECHNIQUE: Contiguous axial images were obtained from the base of the skull through the vertex without intravenous contrast. RADIATION DOSE REDUCTION: This exam was performed according to the departmental dose-optimization program which includes automated exposure control, adjustment of the mA and/or kV according to patient size and/or use of iterative reconstruction technique. COMPARISON:  01/03/2020 FINDINGS: Brain: No evidence of acute infarction, hemorrhage, hydrocephalus, extra-axial collection or mass lesion/mass effect. Vascular: No hyperdense vessel or unexpected calcification. Skull: Normal. Negative for fracture or focal lesion. Sinuses/Orbits: Mucosal thickening and retention cysts are noted within the maxillary antra bilaterally. This has increased in the interval from the prior exam. Postsurgical changes in the globes are seen stable from the prior exam. Other: None IMPRESSION: No acute intracranial abnormality noted. Mucosal changes within the paranasal sinuses as described. Electronically Signed   By: Inez Catalina M.D.   On: 08/24/2022 22:58        Scheduled Meds:  [START ON 08/26/2022]  stroke: early stages of recovery book   Does not apply Once   aspirin EC  81 mg Oral Daily   clopidogrel  75 mg Oral Daily   escitalopram  40 mg Oral Daily   fenofibrate  160 mg Oral Daily   nicotine  21 mg Transdermal Daily   rosuvastatin  40 mg Oral Daily   Continuous Infusions:  sodium chloride 75 mL/hr at 08/25/22 0816      LOS: 0 days    Time spent: 52 minutes spent on chart review, discussion with nursing staff, consultants, updating family and interview/physical exam; more than 50% of that time was spent in counseling and/or coordination of care.    [place] Sigmon J [place] DO [facility] Available via Epic secure chat 7am-7pm After these hours, please refer to coverage provider listed on [email or web address] 08/25/2022, 11:12 AM

## 2022-08-25 NOTE — Progress Notes (Signed)
VASCULAR LAB    Carotid duplex has been performed.  See CV proc for preliminary results.   Anastasios Melander, RVT 08/25/2022, 4:21 PM

## 2022-08-26 LAB — HEMOGLOBIN A1C
Hgb A1c MFr Bld: 6.1 % — ABNORMAL HIGH (ref 4.8–5.6)
Mean Plasma Glucose: 128 mg/dL

## 2022-08-27 ENCOUNTER — Telehealth: Payer: Self-pay | Admitting: *Deleted

## 2022-08-27 ENCOUNTER — Encounter: Payer: Self-pay | Admitting: *Deleted

## 2022-08-27 NOTE — Patient Outreach (Signed)
  Care Coordination [facility] Note ED Red EMMI Alert  Transition Care Management Follow-up Telephone Call Date of discharge and from where: [facility] on 08/25/22 How have you been since you were released from the hospital? Reports feeling better. No residual effects from TIA. Anxious about getting an appt with [facility]VS to evaluate carotid stenosis and form a plan of care.  Any questions or concerns? Yes. Concerned that he hasn't been outreached to schedule a an appointment with vascular surgeon to address left carotid stenosis. He has reached out to their scheduling department but was told the referral was in process.  Items Reviewed: Did the pt receive and understand the discharge instructions provided? Yes  Medications obtained and verified? Yes . Picked Plavix up to day and will start today. Already taking ASA '81mg'$ .  Other? Yes . Discussed potential side effects of plavix and avoid NSAIDs, other than prescribed '81mg'$  ASA. Reviewed imaging reports. Provided verbal education on TIA vs CVA and increased risk of CVA s/p TIA. Discussed and encouraged smoking cessation.  Any new allergies since your discharge? No  Dietary orders reviewed? Yes Do you have support at home? Yes . Limited. Children available to help but they work during the week. He provides care for his wife who has a TBI and can't stay alone for long.   Home Care and Equipment/Supplies: Were home health services ordered? no If so, what is the name of the agency?   Has the agency set up a time to come to the patient's home? not applicable Were any new equipment or medical supplies ordered?  No What is the name of the medical supply agency?  Were you able to get the supplies/equipment? not applicable Do you have any questions related to the use of the equipment or supplies? No  Functional Questionnaire: (I = Independent and D = Dependent) ADLs: I  Bathing/Dressing- I  Meal Prep- I  Eating- I  Maintaining continence-  I  Transferring/Ambulation- I  Managing Meds- I  Follow up appointments reviewed:  PCP Hospital f/u appt confirmed? No   Specialist Hospital f/u appt confirmed? Yes  Scheduled to see Ernestine Mcmurray, NP (neurology) on 09/25/22 at 9:00. Referral to [facility]VS pending.  Are transportation arrangements needed? No  If their condition worsens, is the pt aware to call PCP or go to the Emergency Dept.? Yes Was the patient provided with contact information for the PCP's office or ED? Yes Was to pt encouraged to call back with questions or concerns? Yes  SDOH assessments and interventions completed:   Yes SDOH Interventions Today    Flowsheet Row Most Recent Value  SDOH Interventions   Housing Interventions Intervention Not Indicated  Transportation Interventions Intervention Not Indicated  Financial Strain Interventions Intervention Not Indicated       Care Coordination Interventions:  PCP follow up appointment requested Reviewed upcoming telephone appt with Joellyn Quails, RN Care Coordinator for 08/30/22. Outreached Kim and Laverda Sorenson, [place], to see if this appt could be moved to next week and scheduled around the same time as his wife, [name]    Encounter Outcome:  Pt. Visit Completed    SEE CARE COORDINATION TELEPHONE ENCOUNTER FROM 08/27/22 FOR ADDITIONAL DETAILS  Chong Sicilian, BSN, RN-BC RN Care Coordinator [place]: [phone] Main #: [phone]

## 2022-08-27 NOTE — Patient Outreach (Signed)
  Care Coordination   Follow Up Visit Note   08/27/2022 Name: Eric Saunders MRN: [id number] DOB: 1956/02/05  Eric Saunders is a 66 y.o. year old male who sees [name], [place] for primary care. I spoke with  [name] by phone today.  What matters to the patients health and wellness today?  Follow up with PCP and specialists regarding recent TIA    Goals Addressed             This Visit's Progress    Care Coordination Services       Care Coordination Interventions: Evaluation of current treatment plan related to recent ED visit for TIA and patient's adherence to plan as established by provider Reviewed medications with patient and discussed new prescription for Plavix. Discussed potential side effects of plavix. Bruising and bleeding risk is main one. Cautioned to prevent falls and report any falls, especially with a head injury, to provider or seek emergency medical attention if needed. Avoid NSAIDs, other than prescribed '81mg'$  ASA. Collaborated with [facility]in [facility] specialists regarding referral. Advised that they have a large referral load and are short staffed. Encouraged patient to reach out to them towards the end of the week if he hasn't received a call back.  Reviewed scheduled/upcoming provider appointments including Ernestine Mcmurray, NP (neurology) on 09/25/22 at 9:00 Discussed plans with patient for ongoing care management follow up and provided patient with direct contact information for care management team Assessed social determinant of health barriers Collaborated with scheduling care guide, Laverda Sorenson and PCP nurse, Theodoro Clock, LPN to schedule patient an ED f/u with [name], [place] on 08/29/22 .  Collaborated with Laverda Sorenson and Jackelyn Poling, RN Care Coordinator [phone] regarding need for Care Coordination f/u. Erline Levine to reschedule appt on 08/30/22 with Maudie Mercury to next week so that it can be coordinated with his wife's telephone appointment.   Assessed family/social support Has assistance from children as needed but they work during the week. He doesn't need assistance himself, but has to have someone stay with his wife when he isn't at home because she has a TBI and is a high fall risk with risk for injury.  Provided verbal education on TIA vs CVA and increased risk of CVA s/p TIA.  Discussed and encouraged smoking cessation. Was smoking at least 1 ppd. Had 5 cigarettes yesterday and on track for that today. Provided encouragement.  Encouraged to reach out to Care Coordination team as needed         SDOH assessments and interventions completed:  Yes SDOH Interventions Today    Flowsheet Row Most Recent Value  SDOH Interventions   Housing Interventions Intervention Not Indicated  Transportation Interventions Intervention Not Indicated  Financial Strain Interventions Intervention Not Indicated  Stress Interventions Other (Comment)  [Situational. Following up with PCP on 08/29/22]        Care Coordination Interventions:  Yes, provided   Follow up plan:  follow-up scheduled with Jackelyn Poling, RN Care Coordinator for 08/30/22 but will be rescheduled for next week. Erline Levine to outreach patient with appt date and time.    Encounter Outcome:  Pt. Visit Completed   Chong Sicilian, BSN, RN-BC RN Care Coordinator [place] Direct Dial: [phone] Main #: [phone]

## 2022-08-27 NOTE — Progress Notes (Signed)
  Care Coordination  Note  08/27/2022 Name: Eric Saunders MRN: [id number] DOB: May 22, 1956  Eric Saunders is a 66 y.o. year old primary care patient of [name], [place]. I reached out to [name] by phone today to assist with scheduling a follow up appointment. [name] verbally consented to my assistance.       Follow up plan: Hospital Follow Up appointment scheduled with Mary-Margaret Hassell Done FNP on 08/29/22 at [place]: [phone]

## 2022-08-29 ENCOUNTER — Ambulatory Visit (INDEPENDENT_AMBULATORY_CARE_PROVIDER_SITE_OTHER): Payer: BC Managed Care – PPO | Admitting: Nurse Practitioner

## 2022-08-29 ENCOUNTER — Encounter: Payer: Self-pay | Admitting: Vascular Surgery

## 2022-08-29 ENCOUNTER — Encounter: Payer: Self-pay | Admitting: Nurse Practitioner

## 2022-08-29 ENCOUNTER — Ambulatory Visit (INDEPENDENT_AMBULATORY_CARE_PROVIDER_SITE_OTHER): Payer: BC Managed Care – PPO | Admitting: Vascular Surgery

## 2022-08-29 VITALS — BP 135/76 | HR 73 | Temp 98.1°F | Resp 20 | Ht 68.0 in | Wt 189.0 lb

## 2022-08-29 VITALS — BP 126/82 | HR 68 | Temp 97.7°F | Resp 20 | Ht 68.0 in | Wt 190.0 lb

## 2022-08-29 DIAGNOSIS — Z7689 Persons encountering health services in other specified circumstances: Secondary | ICD-10-CM

## 2022-08-29 DIAGNOSIS — G459 Transient cerebral ischemic attack, unspecified: Secondary | ICD-10-CM

## 2022-08-29 DIAGNOSIS — I6522 Occlusion and stenosis of left carotid artery: Secondary | ICD-10-CM

## 2022-08-29 NOTE — Patient Instructions (Signed)
Transient Ischemic Attack A transient ischemic attack (TIA) causes the same symptoms as a stroke, but the symptoms go away quickly. A TIA happens when blood flow to the brain is blocked. Having a TIA means you may be at risk for a stroke. A TIA is a medical emergency. What are the causes? A TIA is caused by a blocked artery in the head or neck. This means the brain does not get the blood supply it needs. A blockage can be caused by: Fatty buildup in an artery in the head or neck. A blood clot. A tear in an artery. Irritation and swelling (inflammation) of an artery. Sometimes the cause is not known. What increases the risk? Certain things may make you more likely to have a TIA. Some of these are things that you can change, such as: Using products that have nicotine or tobacco. Not being active. Drinking too much alcohol. Using recreational drugs. Health conditions that may increase your risk include: High blood pressure. High cholesterol. Diabetes. Heart disease. A heartbeat that is not regular (atrial fibrillation). Sickle cell disease. Problems with blood clotting. Other risk factors include: Being over the age of 60. Being male. Being very overweight. Sleep problems (sleep apnea). Having a family history of stroke. Having had blood clots, stroke, TIA, or heart attack in the past. What are the signs or symptoms? The symptoms of a TIA are like those of a stroke. They can include: Weakness or loss of feeling in your face, arm, or leg. This often happens on one side of your body. Trouble walking. Trouble moving your arms or legs. Trouble talking or understanding what people are saying. Problems with how you see. Feeling dizzy. Feeling confused. Loss of balance or coordination. Feeling like you may vomit (nausea) or vomiting. Having a very bad headache. If you can, note what time you started to have symptoms. Tell your doctor. How is this treated? The goal of treatment is to  lower the risk for a stroke. This may include: Changes to diet and lifestyle, such as getting regular exercise and stopping smoking. Taking medicines to: Thin the blood. Lower blood pressure. Lower cholesterol. Treating other health conditions, such as diabetes. If testing shows that an artery in your brain is narrow, your doctor may recommend a procedure to: Take the blockage out of your artery. Open or widen an artery in your neck (carotid angioplasty and stenting). Follow these instructions at home: Medicines Take over-the-counter and prescription medicines only as told by your doctor. If you were told to take aspirin or another medicine to thin your blood, use it exactly as told by your doctor. Taking too much of the medicine can cause bleeding. Taking too little of the medicine may not work to treat the problem. Eating and drinking  Eat 5 or more servings of fruits and vegetables each day. Follow instructions from your doctor about your diet. You may need to follow a certain diet to help lower your risk of a stroke. You may need to: Eat a diet that is low in fat and salt. Eat foods with a lot of fiber. Limit carbohydrates and sugar. If you drink alcohol: Limit how much you have to: 0-1 drink a day for women who are not pregnant. 0-2 drinks a day for men. Know how much alcohol is in a drink. In the [place] one drink equals one 12 oz bottle of beer (355 mL), one 5 oz glass of wine (148 mL), or one 1 oz glass of hard   liquor (44 mL). General instructions Keep a healthy weight. Try to get at least 30 minutes of exercise on most days. Get treatment if you have sleep problems. Do not smoke or use any products that contain nicotine or tobacco. If you need help quitting, ask your doctor. Do not use drugs. Keep all follow-up visits. Your doctor will want to know if you have any more symptoms and to check blood labs if any medicines were prescribed. Where to find more  information American Stroke Association: stroke.org Get help right away if: You have chest pain. You have a heartbeat that is not regular. You have any signs of a stroke. "BE FAST" is an easy way to remember the main warning signs: B - Balance. Dizziness, sudden trouble walking, or loss of balance. E - Eyes. Trouble seeing or a change in how you see. F - Face. Sudden weakness or loss of feeling of the face. The face or eyelid may droop on one side. A - Arms. Weakness or loss of feeling in an arm. This happens all of a sudden and most often on one side of the body. S - Speech. Sudden trouble speaking, slurred speech, or trouble understanding what people say. T - Time. Time to call emergency services. Write down what time symptoms started. You have other signs of a stroke, such as: A sudden, very bad headache with no known cause. Feeling like you may vomit. Vomiting. A seizure. These symptoms may be an emergency. Get help right away. Call 911. Do not wait to see if the symptoms will go away. Do not drive yourself to the hospital. This information is not intended to replace advice given to you by your health care provider. Make sure you discuss any questions you have with your health care provider. Document Revised: 02/15/2022 Document Reviewed: 02/15/2022 Elsevier Patient Education  2023 Elsevier Inc.  

## 2022-08-29 NOTE — Progress Notes (Signed)
Subjective:    Patient ID: Eric Saunders, male    DOB: 11-Nov-1955, 66 y.o.   MRN: [id number]  Today's visit was for Transitional Care Management.  The patient was discharged from [facility] on 08/25/22 with a primary diagnosis of TIA.   Contact with the patient and/or caregiver, by a clinical staff member, was made on 08/27/22 and was documented as a telephone encounter within the EMR.  Through chart review and discussion with the patient I have determined that management of their condition is of high complexity.    Patient states since being home he is doing well. No residual effects. Says he is just more anxious then usual. He is caregiver for his wife so this has added some stress. The ED said that he may have just had panic attack , but could not rule out TIA. He has had no more episodes since discharge. He was started on plavix by ED  Has appointment with vascular surgeon today to discuss doppler study results.   Review of Systems  Constitutional:  Negative for diaphoresis.  Eyes:  Negative for pain.  Respiratory:  Negative for shortness of breath.   Cardiovascular:  Negative for chest pain, palpitations and leg swelling.  Gastrointestinal:  Negative for abdominal pain.  Endocrine: Negative for polydipsia.  Skin:  Negative for rash.  Neurological:  Negative for dizziness, weakness and headaches.  Hematological:  Does not bruise/bleed easily.  All other systems reviewed and are negative.      Objective:   Physical Exam Vitals and nursing note reviewed.  Constitutional:      Appearance: Normal appearance. He is well-developed.  Neck:     Thyroid: No thyroid mass or thyromegaly.     Vascular: No carotid bruit or JVD.     Trachea: Phonation normal.  Cardiovascular:     Rate and Rhythm: Normal rate and regular rhythm.  Pulmonary:     Effort: Pulmonary effort is normal. No respiratory distress.     Breath sounds: Normal breath sounds.  Abdominal:     General: Bowel  sounds are normal.     Palpations: Abdomen is soft.     Tenderness: There is no abdominal tenderness.  Musculoskeletal:        General: Normal range of motion.     Cervical back: Normal range of motion and neck supple.  Lymphadenopathy:     Cervical: No cervical adenopathy.  Skin:    General: Skin is warm and dry.  Neurological:     Mental Status: He is alert and oriented to person, place, and time.  Psychiatric:        Behavior: Behavior normal.        Thought Content: Thought content normal.        Judgment: Judgment normal.    BP 126/82   Pulse 68   Temp 97.7 F (36.5 C) (Temporal)   Resp 20   Ht '5\' 8"'$  (1.727 m)   Wt 190 lb (86.2 kg)   SpO2 96%   BMI 28.89 kg/m         Assessment & Plan:  [name] in today with chief complaint of Transitions Of Care   1. Encounter for support and coordination of transition of care Hospital records reviewed  2. TIA (transient ischemic attack) Report any weakness Keep appointment with vascular surgeon today Continue plavix- ask vascular if he wants yu to remain on it.    The above assessment and management plan was discussed with the  patient. The patient verbalized understanding of and has agreed to the management plan. Patient is aware to call the clinic if symptoms persist or worsen. Patient is aware when to return to the clinic for a follow-up visit. Patient educated on when it is appropriate to go to the emergency department.   Mary-Margaret Hassell Done, FNP

## 2022-08-29 NOTE — Progress Notes (Signed)
ASSESSMENT & PLAN   ASYMPTOMATIC 60 TO 79% LEFT CAROTID STENOSIS: This patient has an asymptomatic 60 to 79% left carotid stenosis.  I explained we would not consider left carotid endarterectomy unless he developed left hemispheric symptoms or the stenosis progressed to greater than 80%.  We have discussed importance of tobacco cessation.  We also discussed importance of nutrition and exercise.  I ordered a follow-up carotid duplex scan in 6 months and I will see him back at that time.  With respect to the symptoms he had in the ER he had left-sided symptoms but his right carotid is widely patent both by CT and duplex.  He is on aspirin and a statin.  I think would be reasonable to stop his Plavix.  REASON FOR CONSULT:    Carotid disease.  The consult is requested by the emergency department.  HPI:   Eric Saunders is a 66 y.o. male who was seen in the [facility] emergency department on 08/24/2022 with weakness.  The patient also had noted some left sided facial numbness in addition to left arm and leg tingling.  This happened during a coughing spell.  He had no further neurologic symptoms since that visit to the emergency department.  His symptoms resolved fairly quickly.  He had no previous history of stroke, TIAs, expressive or receptive aphasia or amaurosis fugax.  He was on at sprain and was on a statin prior to this emergency department visit.  He denies any history of claudication, rest pain, or nonhealing ulcers.  His risk factors for peripheral arterial disease include hypertension, hypercholesterolemia, and tobacco use.  He smokes a pack per day and has been smoking for 30 years.  He underwent coronary revascularization in 2015 and and has done well since that time.  He also has a history of COPD.  Past Medical History:  Diagnosis Date   Arthritis    Carotid artery occlusion    COPD (chronic obstructive pulmonary disease) (HCC)    Coronary artery disease    Depression     GERD (gastroesophageal reflux disease)    History of angina    Hyperlipemia    Hypertension    Panic disorder    Tobacco abuse     Family History  Problem Relation Age of Onset   Drug abuse Sister    Healthy Brother    Diabetes Sister    Healthy Sister    Healthy Sister    Healthy Sister    Healthy Sister    Healthy Brother    Colon cancer Neg Hx    Esophageal cancer Neg Hx    Inflammatory bowel disease Neg Hx    Liver disease Neg Hx    Pancreatic cancer Neg Hx    Rectal cancer Neg Hx    Stomach cancer Neg Hx     SOCIAL HISTORY: Social History   Tobacco Use   Smoking status: Every Day    Packs/day: 1.00    Years: 30.00    Total pack years: 30.00    Types: Cigarettes    Start date: 1990   Smokeless tobacco: Never   Tobacco comments:    Has cut back from a little over 1 pack a day to 5 cigarettes the past few days  Substance Use Topics   Alcohol use: Yes    Alcohol/week: 6.0 standard drinks of alcohol    Types: 6 Cans of beer per week    Comment: 6 weekly    Allergies  Allergen  Reactions   Ace Inhibitors Cough    Current Outpatient Medications  Medication Sig Dispense Refill   ALPRAZolam (XANAX) 0.5 MG tablet Take 1 tablet (0.5 mg total) by mouth 2 (two) times daily as needed for anxiety. 60 tablet 2   amLODipine (NORVASC) 10 MG tablet Take 1 tablet (10 mg total) by mouth daily. 90 tablet 1   aspirin 81 MG tablet Take 81 mg by mouth daily.     benzonatate (TESSALON) 100 MG capsule TAKE 1 CAPSULE BY MOUTH THREE TIMES A DAY AS NEEDED FOR COUGH (Patient taking differently: Take 100 mg by mouth 3 (three) times daily as needed for cough.) 20 capsule 0   cloNIDine (CATAPRES) 0.1 MG tablet Take 1 tablet (0.1 mg total) by mouth 2 (two) times daily. 180 tablet 1   clopidogrel (PLAVIX) 75 MG tablet Take 1 tablet (75 mg total) by mouth daily for 21 days. 21 tablet 0   cyclobenzaprine (FLEXERIL) 10 MG tablet Take 1 tablet (10 mg total) by mouth 2 (two) times daily as  needed. 30 tablet 5   escitalopram (LEXAPRO) 20 MG tablet Take 2 tablets (40 mg total) by mouth daily. 180 tablet 1   fenofibrate 160 MG tablet Take 1 tablet (160 mg total) by mouth daily. 90 tablet 1   hydrochlorothiazide (HYDRODIURIL) 25 MG tablet Take 1 tablet (25 mg total) by mouth daily. (Patient not taking: Reported on 08/29/2022) 90 tablet 1   ibuprofen (ADVIL) 200 MG tablet Take 800 mg by mouth every 8 (eight) hours as needed for moderate pain (for knee or back pain).     losartan (COZAAR) 100 MG tablet Take 1 tablet (100 mg total) by mouth daily. 90 tablet 1   metoprolol succinate (TOPROL-XL) 25 MG 24 hr tablet Take 1 tablet (25 mg total) by mouth daily. 90 tablet 1   omeprazole (PRILOSEC) 40 MG capsule Take 1 capsule (40 mg total) by mouth 2 (two) times daily. (Patient not taking: Reported on 08/29/2022) 180 capsule 1   rosuvastatin (CRESTOR) 40 MG tablet Take 1 tablet (40 mg total) by mouth daily. 90 tablet 1   sildenafil (VIAGRA) 100 MG tablet Take 100 mg by mouth as needed for erectile dysfunction.     triamcinolone cream (KENALOG) 0.1 % Apply 1 application topically 2 (two) times daily. (Patient taking differently: Apply 1 application  topically daily as needed (for itching on arms).) 453.6 g 1   No current facility-administered medications for this visit.    REVIEW OF SYSTEMS:  '[X]'$  denotes positive finding, '[ ]'$  denotes negative finding Cardiac  Comments:  Chest pain or chest pressure:    Shortness of breath upon exertion:    Short of breath when lying flat:    Irregular heart rhythm:        Vascular    Pain in calf, thigh, or hip brought on by ambulation:    Pain in feet at night that wakes you up from your sleep:     Blood clot in your veins:    Leg swelling:         Pulmonary    Oxygen at home:    Productive cough:     Wheezing:         Neurologic    Sudden weakness in arms or legs:     Sudden numbness in arms or legs:  x   Sudden onset of difficulty speaking or  slurred speech:    Temporary loss of vision in one eye:  Problems with dizziness:         Gastrointestinal    Blood in stool:     Vomited blood:         Genitourinary    Burning when urinating:     Blood in urine:        Psychiatric    Major depression:         Hematologic    Bleeding problems:    Problems with blood clotting too easily:        Skin    Rashes or ulcers:        Constitutional    Fever or chills:    -  PHYSICAL EXAM:   Vitals:   08/29/22 1245 08/29/22 1247  BP: 134/86 135/76  Pulse: 73   Resp: 20   Temp: 98.1 F (36.7 C)   SpO2: 94%   Weight: 189 lb (85.7 kg)   Height: '5\' 8"'$  (1.727 m)    Body mass index is 28.74 kg/m. GENERAL: The patient is a well-nourished male, in no acute distress. The vital signs are documented above. CARDIAC: There is a regular rate and rhythm.  VASCULAR: I do not detect carotid bruits. He has palpable femoral, dorsalis pedis, and posterior tibial pulses bilaterally. PULMONARY: There is good air exchange bilaterally without wheezing or rales. ABDOMEN: Soft and non-tender with normal pitched bowel sounds.  I do not palpate an aneurysm.  MUSCULOSKELETAL: There are no major deformities. NEUROLOGIC: No focal weakness or paresthesias are detected. SKIN: There are no ulcers or rashes noted. PSYCHIATRIC: The patient has a normal affect.  DATA:    CAROTID DUPLEX: I have reviewed the carotid duplex scan that was done on 08/25/2022.  On the right side there was a less than 39% stenosis.  The right vertebral artery was patent with antegrade flow.  On the left side there was a 60 to 79% stenosis.  The left vertebral artery was patent with antegrade flow.  CT ANGIO HEAD AND NECK: I reviewed the images of the CT angio of the head and neck that was done on 08/25/2022.  This showed a 50% stenosis in the proximal left ICA with calcific plaque.  There was no significant stenosis on the right.   Deitra Mayo [facility]  [facility]

## 2022-08-30 ENCOUNTER — Encounter: Payer: BC Managed Care – PPO | Admitting: *Deleted

## 2022-09-03 ENCOUNTER — Ambulatory Visit: Payer: Self-pay | Admitting: *Deleted

## 2022-09-03 DIAGNOSIS — I10 Essential (primary) hypertension: Secondary | ICD-10-CM

## 2022-09-03 DIAGNOSIS — G459 Transient cerebral ischemic attack, unspecified: Secondary | ICD-10-CM

## 2022-09-03 DIAGNOSIS — Z720 Tobacco use: Secondary | ICD-10-CM

## 2022-09-03 DIAGNOSIS — M7542 Impingement syndrome of left shoulder: Secondary | ICD-10-CM

## 2022-09-03 DIAGNOSIS — M5136 Other intervertebral disc degeneration, lumbar region: Secondary | ICD-10-CM

## 2022-09-03 DIAGNOSIS — F411 Generalized anxiety disorder: Secondary | ICD-10-CM

## 2022-09-03 NOTE — Patient Outreach (Signed)
  Care Coordination   09/03/2022 Name: Eric Saunders MRN: [id number] DOB: 04-25-1956   Care Coordination Outreach Attempts:  An unsuccessful telephone outreach was attempted today to offer the patient information about available care coordination services as a benefit of their health plan.   Follow Up Plan:  Additional outreach attempts will be made to offer the patient care coordination information and services.   Encounter Outcome:  No Answer  No answer of his mobile number   Care Coordination Interventions:  No, not indicated    Roxan Yamamoto L. Lavina Hamman, RN, BSN, [place] Coordinator Office number [phone]

## 2022-09-03 NOTE — Patient Outreach (Signed)
  Care Coordination   Follow Up Visit Note   10/23/2022 Late entry for 09/03/22  Name: Eric Saunders MRN: [id number] DOB: Mar 07, 1956  Eric Saunders is a 66 y.o. year old male who sees [name], [place] for primary care. I spoke with  [name] by phone today.  What matters to the patients health and wellness today?  Interested in the impression of his November 2023 Lung CT + care giver He reports he started smoke cessation after the ED visit but increased again on yesterday Having caregiver stress related to taking care of his wife Husband reports patient's tasks, activities of daily living slower Discussed caregiver burnout and prevention - encourage a schedule with his children  He has tried Wellbutrin to stop smoking without poor success "I feel like I am a prisoner of my own house"  Encouraged time for himself  Discussed respite care and adult day care  May have one of his sister moving from [place] that may be able to assisted  Discussed home health, outpatient therapy   Goals Addressed               This Visit's Progress     Patient Stated     Decrease caregiver burnout [facility]) (pt-stated)   Not on track     Care Coordination Interventions: Evaluation of current treatment plan related to care giver burnout/respite services/ smoking trigger and patient's adherence to plan as established by provider Care Guide referral for smoke cessation and respite resources Social Work referral for care giver burnout, respite services for 66 year old caregiver of disabled TBI wife [name] Discussed plans with patient for ongoing care management follow up and provided patient with direct contact information for care management team Screening for signs and symptoms of depression related to chronic disease state  Assessed social determinant of health barriers      Make and Keep medical appointments [facility]) (pt-stated)   On track     Care Coordination Interventions: Discussed plans  with patient for ongoing care management follow up and provided patient with direct contact information for care management team Screening for signs and symptoms of depression related to chronic disease state  Assessed social determinant of health barriers Confirmed he attended his scheduled pcp and vascular doctor visits recently as confirmed by notes in [facility]IC        SDOH assessments and interventions completed:  Yes     Care Coordination Interventions:  Yes, provided   Follow up plan: Follow up call scheduled for 11/04/22    Encounter Outcome:  Pt. Visit Completed   Nihal Doan L. Lavina Hamman, RN, BSN, [place] Coordinator Office number [phone]

## 2022-09-05 ENCOUNTER — Other Ambulatory Visit: Payer: Self-pay | Admitting: Nurse Practitioner

## 2022-09-05 MED ORDER — OSELTAMIVIR PHOSPHATE 75 MG PO CAPS: 75.0000 mg | ORAL_CAPSULE | Freq: Every day | ORAL | 0 refills | Status: DC

## 2022-09-10 ENCOUNTER — Other Ambulatory Visit: Payer: Self-pay

## 2022-09-10 DIAGNOSIS — I6522 Occlusion and stenosis of left carotid artery: Secondary | ICD-10-CM

## 2022-09-19 ENCOUNTER — Other Ambulatory Visit: Payer: Self-pay | Admitting: Nurse Practitioner

## 2022-09-19 MED ORDER — CYCLOBENZAPRINE HCL 10 MG PO TABS: 10.0000 mg | ORAL_TABLET | Freq: Two times a day (BID) | ORAL | 5 refills | Status: DC | PRN

## 2022-09-19 NOTE — Progress Notes (Signed)
Meds ordered this encounter  Medications   cyclobenzaprine (FLEXERIL) 10 MG tablet    Sig: Take 1 tablet (10 mg total) by mouth 2 (two) times daily as needed.    Dispense:  30 tablet    Refill:  5    Order Specific Question:   Supervising Provider    Answer:   Worthy Rancher [[id number]]   [place], FNP

## 2022-09-19 NOTE — Progress Notes (Signed)
Patient notified of LDCT Lung Cancer Screening Results via mail with the recommendation to follow-up in 12 months. Patient's referring provider has been sent a copy of results. Results are as follows:   IMPRESSION: 1. Lung-RADS 1, negative. Continue annual screening with low-dose chest CT without contrast in 12 months. 2. Aortic Atherosclerosis (ICD10-I70.0) and Emphysema (ICD10-J43.9).

## 2022-09-25 ENCOUNTER — Inpatient Hospital Stay: Payer: Medicare Other | Admitting: Adult Health

## 2022-10-08 ENCOUNTER — Encounter: Payer: Self-pay | Admitting: Adult Health

## 2022-10-08 ENCOUNTER — Ambulatory Visit: Payer: BC Managed Care – PPO | Admitting: Adult Health

## 2022-10-08 VITALS — BP 133/83 | HR 65 | Ht 67.0 in | Wt 195.8 lb

## 2022-10-08 DIAGNOSIS — G459 Transient cerebral ischemic attack, unspecified: Secondary | ICD-10-CM

## 2022-10-08 DIAGNOSIS — R569 Unspecified convulsions: Secondary | ICD-10-CM | POA: Diagnosis not present

## 2022-10-08 DIAGNOSIS — R0683 Snoring: Secondary | ICD-10-CM | POA: Diagnosis not present

## 2022-10-08 NOTE — Patient Instructions (Signed)
Your Plan:  Continue Aspirin  Blood pressure goal <130/90 Cholesterol LDL goal <70 Diabetes goal A1c <7 Stop smoking EEG ordered Referral to sleep evaluation Monitor diet and try to exercise   Thank you for coming to see [place] at [facility]. I hope we have been able to provide you high quality care today.  You may receive a patient satisfaction survey over the next few weeks. We would appreciate your feedback and comments so that we may continue to improve ourselves and the health of our patients.

## 2022-10-08 NOTE — Progress Notes (Signed)
PATIENT: Eric Saunders DOB: [birth year]  REASON FOR VISIT: follow up HISTORY FROM: patient PRIMARY NEUROLOGIST: Dr. Leonie Man  Chief Complaint  Patient presents with   Follow-up    [name]  Pt states he feels great today      HISTORY OF PRESENT ILLNESS: Today 10/08/22  Eric Saunders is a 67 y.o. male who comes to this office for follow-up after hospitalization for TIA.  Reports that he is currently on aspirin.  He is already stopped Plavix.  Denies any significant bruising or bleeding.  Reports that today he feels great.  Has not had any additional strokelike symptoms. Has followed with Vascular surgery for L ICA stenosis- will monitor for now. Repeat carotid ultrasound in 6 months.  Reports that the episodes always start with coughing and then he loses his breath- then left arm will start shaking symptoms and will go numb. This last episode left leg was shaking.  Reports that this has been going on since 2015.  He has had a full workup with several ED visits.  Has seen specialist including cardiology and GI.  Had an EEG while at the hospital that was negative.   Continue to Smoke cigarettes. Reports that he has the desire to stop smoking but has not stopped yet. Continues to smoke marijuana on occasion.   Reports that he probably snores. Some mornings he feels tired other days he wakes up ready to go.  Had a sleep study ordered in 2019 but did not complete.  Reports has anxiety has xanax that he uses.  Reports that he also gets panic attacks.  Currently managed by his PCP    HISTORY Eric Saunders is a 67 y.o. male with history of multiple stroke risk factors including hypertension, hyperlipidemia, CAD, current smoker who presents with an episode of coughing fit followed by left-sided numbness involving left face, left arm and left leg along with significant incoordination and almost limp in his left leg.  This went on for 30 minutes and then started easing  up. MRI is neg for acute stroke. Overall, presentation is most concerning for a TIA.   Convulsive cough syncope / pre-syncope  History of cough syncope with left arm jerking since 2015.  Workup with cardiology has been negative. No sign or symptoms of bow hunter syndrome CT head No acute abnormality. Small vessel disease.  CTA head & neck: No LVO or significant stenosis; Aortic atherosclerosis. ~33% prox LICA stenosis with soft and calcified plaque. No dissection.  MRI  No acute finding; small vessel disease MRI C spine Multilevel degenerative changes without evidence of high grade spinal canal or neuroforaminal stenosis 2D Echo EF 55 to 60% CUS left ICA 60 to 79% stenosis LDL 90 HgbA1c 5.9 VTE prophylaxis -SCDs aspirin 81 mg daily prior to admission, now on aspirin 81 mg daily and Plavix '75mg'$  x 3wks, then Plavix '75mg'$  alone  REVIEW OF SYSTEMS: Out of a complete 14 system review of symptoms, the patient complains only of the following symptoms, and all other reviewed systems are negative.  ALLERGIES: Allergies  Allergen Reactions   Ace Inhibitors Cough    HOME MEDICATIONS: Outpatient Medications Prior to Visit  Medication Sig Dispense Refill   ALPRAZolam (XANAX) 0.5 MG tablet Take 1 tablet (0.5 mg total) by mouth 2 (two) times daily as needed for anxiety. 60 tablet 2   amLODipine (NORVASC) 10 MG tablet Take 1 tablet (10 mg total) by mouth daily.  90 tablet 1   aspirin 81 MG tablet Take 81 mg by mouth daily.     benzonatate (TESSALON) 100 MG capsule TAKE 1 CAPSULE BY MOUTH THREE TIMES A DAY AS NEEDED FOR COUGH (Patient taking differently: Take 100 mg by mouth 3 (three) times daily as needed for cough.) 20 capsule 0   cloNIDine (CATAPRES) 0.1 MG tablet Take 1 tablet (0.1 mg total) by mouth 2 (two) times daily. 180 tablet 1   cyclobenzaprine (FLEXERIL) 10 MG tablet Take 1 tablet (10 mg total) by mouth 2 (two) times daily as needed. 30 tablet 5   escitalopram (LEXAPRO) 20 MG tablet Take 2  tablets (40 mg total) by mouth daily. 180 tablet 1   fenofibrate 160 MG tablet Take 1 tablet (160 mg total) by mouth daily. 90 tablet 1   hydrochlorothiazide (HYDRODIURIL) 25 MG tablet Take 1 tablet (25 mg total) by mouth daily. 90 tablet 1   ibuprofen (ADVIL) 200 MG tablet Take 800 mg by mouth every 8 (eight) hours as needed for moderate pain (for knee or back pain).     losartan (COZAAR) 100 MG tablet Take 1 tablet (100 mg total) by mouth daily. 90 tablet 1   metoprolol succinate (TOPROL-XL) 25 MG 24 hr tablet Take 1 tablet (25 mg total) by mouth daily. 90 tablet 1   omeprazole (PRILOSEC) 40 MG capsule Take 1 capsule (40 mg total) by mouth 2 (two) times daily. (Patient taking differently: Take 40 mg by mouth daily.) 180 capsule 1   oseltamivir (TAMIFLU) 75 MG capsule Take 1 capsule (75 mg total) by mouth daily. 10 capsule 0   rosuvastatin (CRESTOR) 40 MG tablet Take 1 tablet (40 mg total) by mouth daily. 90 tablet 1   triamcinolone cream (KENALOG) 0.1 % Apply 1 application topically 2 (two) times daily. (Patient taking differently: Apply 1 application  topically daily as needed (for itching on arms).) 453.6 g 1   sildenafil (VIAGRA) 100 MG tablet Take 100 mg by mouth as needed for erectile dysfunction. (Patient not taking: Reported on 10/08/2022)     No facility-administered medications prior to visit.    PAST MEDICAL HISTORY: Past Medical History:  Diagnosis Date   Arthritis    Carotid artery occlusion    COPD (chronic obstructive pulmonary disease) (HCC)    Coronary artery disease    Depression    GERD (gastroesophageal reflux disease)    History of angina    Hyperlipemia    Hypertension    Panic disorder    Tobacco abuse     PAST SURGICAL HISTORY: Past Surgical History:  Procedure Laterality Date   COLONOSCOPY     CORONARY ARTERY BYPASS GRAFT N/A 06/17/2014   Procedure: CORONARY ARTERY BYPASS GRAFTING (CABG) X 4, RIGHT LEG EVH;  Surgeon: Gaye Pollack, MD;  Location: [place];   Service: Open Heart Surgery;  Laterality: N/A;   KNEE SURGERY     bilateral arthroscopic   LEFT HEART CATHETERIZATION WITH CORONARY ANGIOGRAM N/A 06/16/2014   Procedure: LEFT HEART CATHETERIZATION WITH CORONARY ANGIOGRAM;  Surgeon: Burnell Blanks, MD;  Location[facility] C[facility] LAB;  Service: Cardiovascular;  Laterality: N/A;   TEE WITHOUT CARDIOVERSION N/A 06/17/2014   Procedure: TRANSESOPHAGEAL ECHOCARDIOGRAM (TEE);  Surgeon: Gaye Pollack, MD;  Location: [place];  Service: Open Heart Surgery;  Laterality: N/A;   UPPER GASTROINTESTINAL ENDOSCOPY      FAMILY HISTORY: Family History  Problem Relation Age of Onset   Drug abuse Sister    Diabetes Sister  Healthy Sister    Healthy Sister    Healthy Sister    Healthy Sister    Healthy Brother    Healthy Brother    Colon cancer Neg Hx    Esophageal cancer Neg Hx    Inflammatory bowel disease Neg Hx    Liver disease Neg Hx    Pancreatic cancer Neg Hx    Rectal cancer Neg Hx    Stomach cancer Neg Hx    Stroke Neg Hx     SOCIAL HISTORY: Social History   Socioeconomic History   Marital status: Married    Spouse name: Not on file   Number of children: 2   Years of education: Not on file   Highest education level: Not on file  Occupational History   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   Working on  permanent disability.    Social Determinants of Health   Financial Resource Strain: Low Risk  (08/27/2022)   Overall Financial Resource Strain (CARDIA)    Difficulty of Paying Living Expenses: Not very hard  Food Insecurity: No Food Insecurity (07/02/2022)   Hunger Vital Sign    Worried About Running Out of Food in the Last Year: Never true    Ran Out of Food in the Last Year: Never true  Transportation Needs: No Transportation Needs (08/27/2022)   PRAPARE - Hydrologist (Medical): No    Lack of Transportation (Non-Medical): No  Physical Activity: Sufficiently Active (07/02/2022)   Exercise Vital Sign    Days of Exercise per Week: 7 days    Minutes of Exercise per Session: 30 min  Stress: Stress Concern Present (08/27/2022)   [place]    Feeling of Stress : To some extent  Social Connections: Socially Integrated (04/23/2022)   Social Connection and Isolation Panel [NHANES]    Frequency of Communication with Friends and Family: More than three times a week    Frequency of Social Gatherings with Friends and Family: More than three times a week    Attends Religious Services: 1 to 4 times per year    Active Member of [employer] or Organizations: Yes    Attends Archivist Meetings: 1 to 4 times per year    Marital Status: Married  Human resources officer Violence: Not At Risk (07/02/2022)   Humiliation, Afraid, Rape, and Kick questionnaire    Fear of Current or Ex-Partner: No    Emotionally Abused: No    Physically Abused: No    Sexually Abused: No      PHYSICAL EXAM  Vitals:   10/08/22  3267  Weight: 195 lb 12.8 oz (88.8 kg)  Height: '5\' 7"'$  (1.702 m)   Body mass index is 30.67 kg/m.  Generalized: Well developed, in no acute distress   Neurological examination  Mentation: Alert oriented to time, place, history taking. Follows all commands speech and language fluent Cranial nerve  II-XII: Pupils were equal round reactive to light. Extraocular movements were full, visual field were full on confrontational test. Facial sensation and strength were normal.  Head turning and shoulder shrug  were normal and symmetric. Motor: The motor testing reveals 5 over 5 strength of all 4 extremities. Good symmetric motor tone is noted throughout.  Sensory: Sensory testing is intact to soft touch on all 4 extremities. No evidence of extinction is noted.  Coordination: Cerebellar testing reveals good finger-nose-finger and heel-to-shin bilaterally.  Gait and station: Gait is normal.  Reflexes: Deep tendon reflexes are symmetric and normal bilaterally.   DIAGNOSTIC DATA (LABS, IMAGING, TESTING) - I reviewed patient records, labs, notes, testing and imaging myself where available.  Lab Results  Component Value Date   WBC 8.7 08/24/2022   HGB 13.9 08/24/2022   HCT 41.0 08/24/2022   MCV 87.4 08/24/2022   PLT 289 08/24/2022      Component Value Date/Time   NA 135 08/24/2022 2251   NA 138 07/01/2022 0916   K 4.6 08/24/2022 2251   CL 104 08/24/2022 2251   CO2 22 08/24/2022 2242   GLUCOSE 80 08/24/2022 2251   BUN 26 (H) 08/24/2022 2251   BUN 16 07/01/2022 0916   CREATININE 1.00 08/24/2022 2251   CREATININE 1.04 08/10/2014 0913   CALCIUM 9.2 08/24/2022 2242   PROT 6.6 08/24/2022 2242   PROT 7.5 07/01/2022 0916   ALBUMIN 3.9 08/24/2022 2242   ALBUMIN 4.9 07/01/2022 0916   AST 25 08/24/2022 2242   ALT 21 08/24/2022 2242   ALKPHOS 43 08/24/2022 2242   BILITOT 1.1 08/24/2022 2242   BILITOT <0.2 07/01/2022 0916   GFRNONAA >60 08/24/2022 2242   GFRNONAA 59 (L) 06/10/2014 1739   GFRAA 90 07/10/2020 0901   GFRAA 69 06/10/2014 1739   Lab Results  Component Value Date   CHOL 134 08/25/2022   HDL 34 (L) 08/25/2022   LDLCALC 74 08/25/2022   TRIG 132 08/25/2022   CHOLHDL 3.9 08/25/2022   Lab Results  Component Value Date   HGBA1C 6.1 (H) 08/25/2022   Lab Results  Component  Value Date   TIWPYKDX83 382 05/22/2017   Lab Results  Component Value Date   TSH 2.047 08/25/2022      ASSESSMENT AND PLAN 67 y.o. year old male  has a past medical history of Arthritis, Carotid artery occlusion, COPD (chronic obstructive pulmonary disease) ([place]), Coronary artery disease, Depression, GERD (gastroesophageal reflux disease), History of angina, Hyperlipemia, Hypertension, Panic disorder, and Tobacco abuse. here with"  TIA Seizure like event   Continue aspirin 81 mg daily   for secondary stroke prevention.  Discussed secondary stroke prevention measures and importance of close PCP follow up for aggressive stroke risk factor management. I have gone over the pathophysiology of stroke, warning signs and symptoms, risk factors and their management in some detail with instructions to go to the closest emergency room for symptoms of concern. HTN: BP goal <130/90.  HLD: LDL goal <70. Recent LDL 90.  DMII: A1c goal<7.0. Recent A1c 6.1.  Referral for sleep evaluation to rule out sleep apnea-history of possible TIA, snoring EEG ordered for possible seizure like events Encouraged patient to  monitor diet and encouraged exercise FU with our office pending testing      Ward Givens, MSN, NP-C 10/08/2022, 8:55 AM [facility] [address], [place], [place] [address] [phone]

## 2022-10-09 ENCOUNTER — Encounter: Payer: Self-pay | Admitting: Cardiology

## 2022-10-10 NOTE — Progress Notes (Signed)
I agree with the above plan 

## 2022-10-22 ENCOUNTER — Ambulatory Visit: Payer: BC Managed Care – PPO | Admitting: Neurology

## 2022-10-22 DIAGNOSIS — R0681 Apnea, not elsewhere classified: Secondary | ICD-10-CM | POA: Diagnosis not present

## 2022-10-22 DIAGNOSIS — R0683 Snoring: Secondary | ICD-10-CM

## 2022-10-22 DIAGNOSIS — G459 Transient cerebral ischemic attack, unspecified: Secondary | ICD-10-CM

## 2022-10-23 NOTE — Patient Instructions (Signed)
Visit Information  Thank you for taking time to visit with me today. Please don't hesitate to contact me if I can be of assistance to you.   Following are the goals we discussed today:   Goals Addressed               This Visit's Progress     Patient Stated     Decrease caregiver burnout [facility]) (pt-stated)   Not on track     Care Coordination Interventions: Evaluation of current treatment plan related to care giver burnout/respite services/ smoking trigger and patient's adherence to plan as established by provider Care Guide referral for smoke cessation and respite resources Social Work referral for care giver burnout, respite services for 67 year old caregiver of disabled TBI wife [name] Discussed plans with patient for ongoing care management follow up and provided patient with direct contact information for care management team Screening for signs and symptoms of depression related to chronic disease state  Assessed social determinant of health barriers      Make and Keep medical appointments [facility]) (pt-stated)   On track     Care Coordination Interventions: Discussed plans with patient for ongoing care management follow up and provided patient with direct contact information for care management team Screening for signs and symptoms of depression related to chronic disease state  Assessed social determinant of health barriers Confirmed he attended his scheduled pcp and vascular doctor visits recently as confirmed by notes in [place] next appointment is by telephone on 11/04/22 at 1000  Please call the care guide team at [phone] if you need to cancel or reschedule your appointment.   If you are experiencing a Mental Health or [place] or need someone to talk to, please call the Suicide and Crisis Lifeline: 988 call the [place] National Suicide Prevention Lifeline: (352) 631-1086 or TTY: 808 311 7171 TTY 401-538-1854) to talk to a trained  counselor call 1-800-273-TALK (toll free, 24 hour hotline) call the [employer]: 873-568-4719 call 911   Patient verbalizes understanding of instructions and care plan provided today and agrees to view in [place]. Active MyChart status and patient understanding of how to access instructions and care plan via MyChart confirmed with patient.     The patient has been provided with contact information for the care management team and has been advised to call with any health related questions or concerns.    Toua Stites L. Lavina Hamman, RN, BSN, [place] Coordinator Office number [phone]

## 2022-10-25 ENCOUNTER — Telehealth: Payer: Self-pay | Admitting: *Deleted

## 2022-10-25 ENCOUNTER — Ambulatory Visit: Payer: Self-pay | Admitting: *Deleted

## 2022-10-25 NOTE — Progress Notes (Signed)
  Care Coordination   Note   10/25/2022 Name: Eric Saunders MRN: [id number] DOB: 1956/08/20  Eric Saunders is a 67 y.o. year old male who sees [name], [place] for primary care. I reached out to [name] by phone today to offer care coordination services.  [name] was given information about Care Coordination services today including:   The Care Coordination services include support from the care team which includes your Nurse Coordinator, Clinical Social Worker, or Pharmacist.  The Care Coordination team is here to help remove barriers to the health concerns and goals most important to you. Care Coordination services are voluntary, and the patient may decline or stop services at any time by request to their care team member.   Care Coordination Consent Status: Patient agreed to services and verbal consent obtained.   Follow up plan:  Telephone appointment with care coordination team member scheduled for:  10/31/22  Encounter Outcome:  Pt. Scheduled  [place]  Direct Dial: [phone]

## 2022-10-25 NOTE — Patient Outreach (Signed)
  Care Coordination   10/25/2022 Name: Eric Saunders MRN: [id number] DOB: 06-08-1956   Care Coordination Outreach Attempts:  An unsuccessful telephone outreach was attempted today to offer the patient information about available care coordination services as a benefit of their health plan.   Follow Up Plan:  Additional outreach attempts will be made to offer the patient care coordination information and services.   Encounter Outcome:  No Answer   Care Coordination Interventions:  No, not indicated     Natalynn Pedone L. Lavina Hamman, RN, BSN, [place] Coordinator Office number [phone]

## 2022-10-31 ENCOUNTER — Encounter: Payer: Self-pay | Admitting: *Deleted

## 2022-10-31 ENCOUNTER — Ambulatory Visit: Payer: Self-pay | Admitting: *Deleted

## 2022-10-31 NOTE — Patient Outreach (Signed)
Care Coordination   Initial Visit Note   10/31/2022  Name: Eric Saunders MRN: [id number] DOB: 07-Oct-1955  Eric Saunders is a 67 y.o. year old male who sees [name], [place] for primary care. I spoke with [name] by phone today.  What matters to the patients health and wellness today?   Receive Assistance Obtaining Caregiver Supportive Services and Resources.   Goals Addressed               This Visit's Progress     Receive Assistance Obtaining Caregiver Supportive Services and Resources. (pt-stated)   On track     Interventions Today    Flowsheet Row Most Recent Value  Chronic Disease   Chronic disease during today's visit Hypertension (HTN), Other  [Generalized anxiety disorder, panic attacks, tobacco abuse,history of TIA]  General Interventions   General Interventions Discussed/Reviewed General Interventions Discussed, Durable Medical Equipment (DME), Health Screening, Community Resources, Doctor Visits, Communication with  Doctor Visits Discussed/Reviewed Doctor Visits Discussed, Doctor Visits Reviewed, PCP, Specialist  Communication with PCP/Specialists, RN, Pharmacists  Exercise Interventions   Exercise Discussed/Reviewed Exercise Discussed, Physical Activity  Physical Activity Discussed/Reviewed Physical Activity Discussed, Types of exercise  Education Interventions   Education Provided Provided Printed Education, Provided Web-based Education  [place] Discussed, Anxiety, Coping Strategies, Suicide  Pharmacy Interventions   Pharmacy Dicussed/Reviewed Medications and their functions, Medication Adherence, Affording Medications, Referral to Pharmacist  Safety Interventions   Safety Discussed/Reviewed Safety Discussed, Safety Reviewed, Fall Risk  Advanced Directive Interventions   Advanced Directives Discussed/Reviewed Advanced Directives Discussed, Provided resource for acquiring and  filling out documents      Care Coordination Interventions:  Assessed Social Determinant of Health Barriers. Discussed Plans for Ongoing Care Management Follow Up. Provided Tree surgeon Information for Care Management Team Members. Screened for Signs & Symptoms of Depression Related to Chronic Disease State.  PHQ2 & PHQ9 Depression Screen Completed & Results Reviewed.  Suicidal Ideation & Homicidal Ideation Assessed - None Present.   Domestic Violence Exposure Assessed - None Present. Accessible Weapons in [place] - None Present.   Active Listening & Reflection Utilized.  Verbalization of Feelings Encouraged.  Emotional Support Provided. Feelings of Caregiver Burnout Validated. Caregiver Stress Acknowledged. Caregiver Resources Reviewed. Caregiver Support Groups Mailed. Self-Enrollment in Caregiver Support Group of Interest Emphasized. Crisis Support Information, Agencies, Services & Resources Discussed. Problem Solving Interventions Identified. Task-Centered Solutions Implemented.   Solution-Focused Strategies Developed. Acceptance & Commitment Therapy Introduced. Brief Cognitive Behavioral Therapy Initiated. Client-Centered Therapy Enacted. Reviewed Prescription Medications & Discussed Importance of Compliance. Quality of Sleep Assessed & Sleep Hygiene Techniques Promoted. Discussed Higher Level of Care Options ([place], [place]) for Wife & Encouraged Consideration. Verified No In-Home Care Services, [employer], Building control surveyor, Etc., Covered Under Teaching laboratory technician through [employer].  Reviewed Designer, television/film set through [employer] & Encouraged Completion of Medicaid Application for Wife. Verified No Long-Term Care Insurance Benefits, [employer], Plans, Etc.  Confirmed Patient, Nor Wife, Were  Veterans, Making Wife Ineligible for Aid & Attendance Benefits, Through [employer]. Emphasized Importance of Quitting Smoking & Offered Smoking Cessation Classes, Services & Resources. CSW Collaboration with Pharmacy Department, to Chief of Staff with Patient to Perform Review, Procurement & Management of All Prescription Medications. CSW Collaboration with Primary Care Provider, Family Nurse Practitioner, Mary-Margaret Hassell Done with [place] (#  [phone]), to Discuss Medical Concerns. Please Review the Following List of Express Scripts, [employer], [employer], Mailed on 10/31/2022: ~ Adult Day Care Programs  ~ [place] ~ [place] ~ [place] ~ 2023 Medicaid Tips ~ Medicaid Application ~ [place] Application  ~ [place] Providers ~ Advanced Directives  Please Be Prepared to Review List of [place], Complete Applications & Place Referrals to Agencies of Interest, During Next Scheduled Telephone [employer]. Please Contact CSW Directly (# [phone]), if You Have Questions, Need Assistance, or If Additional Social Work Needs Are Identified.         SDOH assessments and interventions completed:  Yes.  SDOH Interventions Today    Flowsheet Row Most Recent Value  SDOH Interventions   Food Insecurity Interventions Intervention Not Indicated  Housing Interventions Intervention Not Indicated  Transportation Interventions Intervention Not Indicated, Patient Resources (Friends/Family)  Utilities Interventions Intervention Not Indicated  Alcohol Usage Interventions Intervention Not Indicated (Score <7)  Financial Strain Interventions Intervention Not Indicated  Physical Activity Interventions Intervention Not Indicated  Stress Interventions Offered [employer] Resources, Provide Counseling,  Other (Comment)  [Provide Counseling Services & Resources]  Social Connections Interventions Intervention Not Indicated, Patient Refused     Care Coordination Interventions:  Yes, provided.   Follow up plan: Follow up call scheduled for 11/14/2022 at 9:30 am.  Encounter Outcome:  Pt. Visit Completed.   Eric Saunders, BSW, MSW, LCSW  Licensed Education officer, environmental [facility] System  Mailing [place] [address], [place], [place] [address] Physical Address-[address] [place], [place] [address] Toll Free Main # [phone] Fax # [phone] Cell # [phone] [email or web address]

## 2022-10-31 NOTE — Patient Instructions (Signed)
Visit Information  Thank you for taking time to visit with me today. Please don't hesitate to contact me if I can be of assistance to you.   Following are the goals we discussed today:   Goals Addressed               This Visit's Progress     Receive Assistance Obtaining Caregiver Supportive Services and Resources. (pt-stated)   On track     Interventions Today    Flowsheet Row Most Recent Value  Chronic Disease   Chronic disease during today's visit Hypertension (HTN), Other  [Generalized anxiety disorder, panic attacks, tobacco abuse,history of TIA]  General Interventions   General Interventions Discussed/Reviewed General Interventions Discussed, Durable Medical Equipment (DME), Health Screening, Community Resources, Doctor Visits, Communication with  Doctor Visits Discussed/Reviewed Doctor Visits Discussed, Doctor Visits Reviewed, PCP, Specialist  Communication with PCP/Specialists, RN, Pharmacists  Exercise Interventions   Exercise Discussed/Reviewed Exercise Discussed, Physical Activity  Physical Activity Discussed/Reviewed Physical Activity Discussed, Types of exercise  Education Interventions   Education Provided Provided Printed Education, Provided Web-based Education  [place] Discussed, Anxiety, Coping Strategies, Suicide  Pharmacy Interventions   Pharmacy Dicussed/Reviewed Medications and their functions, Medication Adherence, Affording Medications, Referral to Pharmacist  Safety Interventions   Safety Discussed/Reviewed Safety Discussed, Safety Reviewed, Fall Risk  Advanced Directive Interventions   Advanced Directives Discussed/Reviewed Advanced Directives Discussed, Provided resource for acquiring and filling out documents      Care Coordination Interventions:  Assessed Social Determinant of Health Barriers. Discussed Plans for Ongoing Care Management Follow Up. Provided Tree surgeon  Information for Care Management Team Members. Screened for Signs & Symptoms of Depression Related to Chronic Disease State.  PHQ2 & PHQ9 Depression Screen Completed & Results Reviewed.  Suicidal Ideation & Homicidal Ideation Assessed - None Present.   Domestic Violence Exposure Assessed - None Present. Accessible Weapons in [place] - None Present.   Active Listening & Reflection Utilized.  Verbalization of Feelings Encouraged.  Emotional Support Provided. Feelings of Caregiver Burnout Validated. Caregiver Stress Acknowledged. Caregiver Resources Reviewed. Caregiver Support Groups Mailed. Self-Enrollment in Caregiver Support Group of Interest Emphasized. Crisis Support Information, Agencies, Services & Resources Discussed. Problem Solving Interventions Identified. Task-Centered Solutions Implemented.   Solution-Focused Strategies Developed. Acceptance & Commitment Therapy Introduced. Brief Cognitive Behavioral Therapy Initiated. Client-Centered Therapy Enacted. Reviewed Prescription Medications & Discussed Importance of Compliance. Quality of Sleep Assessed & Sleep Hygiene Techniques Promoted. Discussed Higher Level of Care Options ([place], [place]) for Wife & Encouraged Consideration. Verified No In-Home Care Services, [employer], Building control surveyor, Etc., Covered Under Teaching laboratory technician through [employer].  Reviewed Designer, television/film set through [employer] & Encouraged Completion of Medicaid Application for Wife. Verified No Long-Term Care Insurance Benefits, [employer], Plans, Etc.  Confirmed Patient, Nor Wife, Were Veterans, Making Wife Ineligible for Aid & Attendance Benefits, Through [employer]. Emphasized Importance of Quitting Smoking & Offered Smoking Cessation Classes, Services &  Resources. CSW Collaboration with Pharmacy Department, to Chief of Staff with Patient to Perform Review, Procurement & Management of All Prescription Medications. CSW Collaboration with Primary Care Provider, Family Nurse Practitioner, Mary-Margaret Hassell Done with [place] [phone]), to Discuss Medical Concerns. Please Review the Following List of Express Scripts, [employer], [employer], Mailed on 10/31/2022: ~ Adult Day Care Programs  ~ [place]  Agencies ~ [place] ~ [place] ~ 2023 Medicaid Tips ~ Medicaid Application ~ [place] Application  ~ [place] Providers ~ Advanced Directives  Please Be Prepared to Review List of [place], Complete Applications & Place Referrals to Agencies of Interest, During Next Scheduled Telephone [employer]. Please Contact CSW Directly (# [phone]), if You Have Questions, Need Assistance, or If Additional Social Work Needs Are Identified.       Our next appointment is by telephone on 11/14/2022 at 9:30 am.  Please call the care guide team at [phone] if you need to cancel or reschedule your appointment.   If you are experiencing a Mental Health or [place] or need someone to talk to, please call the Suicide and Crisis Lifeline: 988 call the [place] National Suicide Prevention Lifeline: 703-288-0733 or TTY: 903 020 6794 TTY 458-410-2594) to talk to a trained counselor call 1-800-273-TALK (toll free, 24 hour hotline) go to [facility] [address] [place] [phone]) call the [place]: 531-384-3946 call 911  Patient verbalizes understanding of instructions and care plan provided today and agrees to view in [place]. Active MyChart status and patient understanding of how to access instructions  and care plan via MyChart confirmed with patient.     Telephone follow up appointment with care management team member scheduled for:  11/14/2022 at 9:30 am.  Nat Christen, BSW, MSW, [place]  Licensed Clinical Social Worker  [place]  Mailing [place]. [address] [place], [place] [address] Physical Address-[address] [place], [place] [address] Toll Free Main # [phone] Fax # [phone] Cell # [phone] [email or web address]

## 2022-11-04 ENCOUNTER — Ambulatory Visit: Payer: Self-pay | Admitting: *Deleted

## 2022-11-04 NOTE — Patient Instructions (Addendum)
Visit Information  Thank you for taking time to visit with me today. Please don't hesitate to contact me if I can be of assistance to you.   Following are the goals we discussed today:   Goals Addressed               This Visit's Progress     Patient Stated     Decrease caregiver burnout [facility]) (pt-stated)   On track     Care Coordination Interventions: Discussed plans with patient for ongoing care management follow up and provided patient with direct contact information for care management team Reviewed the resource items pending from [place] listening Encouragement provided  Interventions Today    Flowsheet Row Most Recent Value  Chronic Disease   Chronic disease during today's visit Other  [care giver burnout/resources]  General Interventions   General Interventions Discussed/Reviewed General Interventions Reviewed, Community Resources  Doctor Visits Discussed/Reviewed Doctor Visits Discussed, PCP, Specialist  PCP/Specialist Visits Compliance with follow-up visit  [place] Reviewed, Coping Strategies            Our next appointment is by telephone on 12/02/22 at 1130  Please call the care guide team at [phone] if you need to cancel or reschedule your appointment.   If you are experiencing a Mental Health or [place] or need someone to talk to, please call the Suicide and Crisis Lifeline: 988 call the [place] National Suicide Prevention Lifeline: 337-450-6152 or TTY: (205)114-0465 TTY (902)622-1377) to talk to a trained counselor call 1-800-273-TALK (toll free, 24 hour hotline) call the [facility]: 276 472 3476 call 911   Patient verbalizes understanding of instructions and care plan provided today and agrees to view in [place]. Active MyChart status and patient understanding of how to access instructions and care plan via MyChart confirmed with patient.      The patient has been provided with contact information for the care management team and has been advised to call with any health related questions or concerns.    Alliana Mcauliff L. Lavina Hamman, RN, BSN, [place] Coordinator Office number [phone]

## 2022-11-04 NOTE — Patient Outreach (Signed)
  Care Coordination   Follow Up Visit Note   11/04/2022 Name: Eric Saunders MRN: [id number] DOB: 1955/12/11  Eric Saunders is a 67 y.o. year old male who sees [name], [place] for primary care. I spoke with  [name] by phone today.  What matters to the patients health and wellness today?  Follow up on [place] outreach, He ventilated his feelings. The mailed resources have not been received yet He discussed his pending    Goals Addressed               This Visit's Progress     Patient Stated     Decrease caregiver burnout [facility]) (pt-stated)   On track     Care Coordination Interventions: Discussed plans with patient for ongoing care management follow up and provided patient with direct contact information for care management team Reviewed the resource items pending from [place] listening Encouragement provided  Interventions Today    Flowsheet Row Most Recent Value  Chronic Disease   Chronic disease during today's visit Other  [care giver burnout/resources]  General Interventions   General Interventions Discussed/Reviewed General Interventions Reviewed, Community Resources  Doctor Visits Discussed/Reviewed Doctor Visits Discussed, PCP, Specialist  PCP/Specialist Visits Compliance with follow-up visit  [place] Reviewed, Coping Strategies            SDOH assessments and interventions completed:  No     Care Coordination Interventions:  Yes, provided   Follow up plan: Follow up call scheduled for 12/02/22    Encounter Outcome:  Pt. Visit Completed   Chong Wojdyla L. Lavina Hamman, RN, BSN, [place] Coordinator Office number [phone]

## 2022-11-05 ENCOUNTER — Ambulatory Visit: Payer: Self-pay | Admitting: *Deleted

## 2022-11-05 NOTE — Patient Outreach (Signed)
  Care Coordination   Collaboration with pcp office  Visit Note   11/05/2022 Name: Eric Saunders MRN: [id number] DOB: June 11, 1956  Eric Saunders is a 67 y.o. year old male who sees Eric Saunders, Eric Saunders for primary care. I  spoke with office staff to change patient appointment as requested on 11/04/22  What matters to the patients health and wellness today?  Change scheduled appointment    Goals Addressed               This Visit's Progress     Patient Stated     Make and Keep medical appointments [facility]) (pt-stated)   On track     Care Coordination Interventions: Discussed plans with patient for ongoing care management follow up and provided patient with direct contact information for care management team Screening for signs and symptoms of depression related to chronic disease state  Assessed social determinant of health barriers Outreach to pcp office per patient request to see if his pcp appointment could be changed Spoke with receptionist to change it to January 14 2023 1200 Interventions Today    Flowsheet Row Most Recent Value  Chronic Disease   Chronic disease during today's visit Other  [pcp appointment]  General Interventions   General Interventions Discussed/Reviewed Communication with, General Interventions Discussed  [spoke with pcp receptionist to rescheduled pcp appointment per patient request]  Doctor Visits Discussed/Reviewed PCP, Doctor Visits Discussed  PCP/Specialist Visits Compliance with follow-up visit  Communication with RN             SDOH assessments and interventions completed:  No     Care Coordination Interventions:  Yes, provided   Follow up plan: Follow up call scheduled for 12/02/22    Encounter Outcome:  Pt. Visit Completed   Iver Miklas L. Lavina Hamman, RN, BSN, [place] Coordinator Office number [phone]

## 2022-11-05 NOTE — Patient Instructions (Signed)
Visit Information  Thank you for taking time to visit with me today. Please don't hesitate to contact me if I can be of assistance to you.   Following are the goals we discussed today:   Goals Addressed               This Visit's Progress     Patient Stated     Make and Keep medical appointments [facility]) (pt-stated)   On track     Care Coordination Interventions: Discussed plans with patient for ongoing care management follow up and provided patient with direct contact information for care management team Screening for signs and symptoms of depression related to chronic disease state  Assessed social determinant of health barriers Outreach to pcp office per patient request to see if his pcp appointment could be changed Spoke with receptionist to change it to January 14 2023 1200 Interventions Today    Flowsheet Row Most Recent Value  Chronic Disease   Chronic disease during today's visit Other  [pcp appointment]  General Interventions   General Interventions Discussed/Reviewed Communication with, General Interventions Discussed  [spoke with pcp receptionist to rescheduled pcp appointment per patient request]  Doctor Visits Discussed/Reviewed PCP, Doctor Visits Discussed  PCP/Specialist Visits Compliance with follow-up visit  Communication with RN             Our next appointment is by telephone on 12/02/22 at 1130  Please call the care guide team at [phone] if you need to cancel or reschedule your appointment.   If you are experiencing a Mental Health or [place] or need someone to talk to, please call the Suicide and Crisis Lifeline: 988 call the [place] National Suicide Prevention Lifeline: 330-056-0638 or TTY: 816-314-1176 TTY (804) 593-8294) to talk to a trained counselor call 1-800-273-TALK (toll free, 24 hour hotline) call the [facility]: [phone] call 911   Patient verbalizes understanding of instructions and care plan  provided today and agrees to view in [place]. Active MyChart status and patient understanding of how to access instructions and care plan via MyChart confirmed with patient.     The patient has been provided with contact information for the care management team and has been advised to call with any health related questions or concerns.    Severino Paolo L. Lavina Hamman, RN, BSN, [place] Coordinator Office number [phone]

## 2022-11-14 ENCOUNTER — Encounter: Payer: Self-pay | Admitting: *Deleted

## 2022-11-14 ENCOUNTER — Ambulatory Visit: Payer: Self-pay | Admitting: *Deleted

## 2022-11-14 NOTE — Patient Outreach (Signed)
  Care Coordination   Follow Up Visit Note   11/14/2022  Name: Eric Saunders MRN: [id number] DOB: 1955-12-25  Eric Saunders is a 67 y.o. year old male who sees Eric Saunders, Eric Saunders for primary care. I spoke with Eric Saunders by phone today.  What matters to the patients health and wellness today?   Receive Assistance Obtaining Caregiver Supportive Services and Resources.   Goals Addressed               This Visit's Progress     Receive Assistance Obtaining Caregiver Supportive Services and Resources. (pt-stated)   On track     Interventions Today    Flowsheet Row Most Recent Value  Chronic Disease   Chronic disease during today's visit Hypertension (HTN), Other  [Generalized anxiety disorder, panic attacks, tobacco abuse,history of TIA]  General Interventions   General Interventions Discussed/Reviewed General Interventions Discussed, Durable Medical Equipment (DME), Health Screening, Community Resources, Doctor Visits, Communication with  Doctor Visits Discussed/Reviewed Doctor Visits Discussed, Doctor Visits Reviewed, PCP, Specialist  Communication with PCP/Specialists, RN, Pharmacists  Exercise Interventions   Exercise Discussed/Reviewed Exercise Discussed, Physical Activity  Physical Activity Discussed/Reviewed Physical Activity Discussed, Types of exercise  Education Interventions   Education Provided Provided Printed Education, Provided Web-based Education  [place] Discussed, Anxiety, Coping Strategies, Suicide  Pharmacy Interventions   Pharmacy Dicussed/Reviewed Medications and their functions, Medication Adherence, Affording Medications, Referral to Pharmacist  Safety Interventions   Safety Discussed/Reviewed Safety Discussed, Safety Reviewed, Fall Risk  Advanced Directive Interventions   Advanced Directives Discussed/Reviewed Advanced Directives Discussed, Provided resource for acquiring and  filling out documents      Care Coordination Interventions:  Active Listening & Reflection Utilized.  Verbalization of Feelings Encouraged.  Emotional Support Provided. Feelings of Caregiver Burnout Validated. Caregiver Stress Acknowledged. Caregiver Resources Reviewed. Caregiver Support Groups Discussed. Self-Enrollment in Caregiver Support Group of Interest Emphasized. Crisis Support Information, Agencies, Services & Resources Reviewed. Problem Solving Interventions Implemented. Task-Centered Solutions Employed.   Solution-Focused Strategies Activated. Acceptance & Commitment Therapy Performed. Cognitive Behavioral Therapy Initiated. Client-Centered Therapy Indicated. Continued Emphases on Quitting Smoking & Smoking Cessation Classes, Services & Resources Offered. Please Review the Following List of Express Scripts, [employer], [employer], Mailed on 10/31/2022 & 11/14/2022, Due to Non-Receipt on 10/31/2022: ~ Adult Day Care Programs  ~ [place] ~ [place] ~ [place] ~ 2023 Medicaid Tips ~ Medicaid Application ~ [place] Application  ~ [place] Providers ~ Advanced Directives  Please Be Prepared to Review List of Express Scripts, [employer], As Well As Aeronautical engineer for [place], During Next Scheduled Telephone [employer]. Please Consider Completion of Advanced Directives ([place]), Notifying CSW Directly [phone]), if You Have Questions or Need Assistance with Completion.      SDOH assessments and interventions completed:  Yes.  Care Coordination Interventions:  Yes, provided.   Follow up plan: Follow up call scheduled for 11/28/2022 at 1:30 pm.  Encounter Outcome:  Pt. Visit Completed.   Eric Saunders, BSW, MSW, LCSW  Licensed Regulatory affairs officer Health System  Mailing [place] [address] [place], [place] [address] Physical Address-[address], [place], [place] [address] Toll Free Main # [phone] Fax # [phone] Cell # [phone] [email or web address]

## 2022-11-14 NOTE — Patient Instructions (Signed)
Visit Information  Thank you for taking time to visit with me today. Please don't hesitate to contact me if I can be of assistance to you.   Following are the goals we discussed today:   Goals Addressed               This Visit's Progress     Receive Assistance Obtaining Caregiver Supportive Services and Resources. (pt-stated)   On track     Interventions Today    Flowsheet Row Most Recent Value  Chronic Disease   Chronic disease during today's visit Hypertension (HTN), Other  [Generalized anxiety disorder, panic attacks, tobacco abuse,history of TIA]  General Interventions   General Interventions Discussed/Reviewed General Interventions Discussed, Durable Medical Equipment (DME), Health Screening, Community Resources, Doctor Visits, Communication with  Doctor Visits Discussed/Reviewed Doctor Visits Discussed, Doctor Visits Reviewed, PCP, Specialist  Communication with PCP/Specialists, RN, Pharmacists  Exercise Interventions   Exercise Discussed/Reviewed Exercise Discussed, Physical Activity  Physical Activity Discussed/Reviewed Physical Activity Discussed, Types of exercise  Education Interventions   Education Provided Provided Printed Education, Provided Web-based Education  [place] Discussed, Anxiety, Coping Strategies, Suicide  Pharmacy Interventions   Pharmacy Dicussed/Reviewed Medications and their functions, Medication Adherence, Affording Medications, Referral to Pharmacist  Safety Interventions   Safety Discussed/Reviewed Safety Discussed, Safety Reviewed, Fall Risk  Advanced Directive Interventions   Advanced Directives Discussed/Reviewed Advanced Directives Discussed, Provided resource for acquiring and filling out documents      Care Coordination Interventions:  Active Listening & Reflection Utilized.  Verbalization of Feelings Encouraged.  Emotional Support Provided. Feelings of Caregiver  Burnout Validated. Caregiver Stress Acknowledged. Caregiver Resources Reviewed. Caregiver Support Groups Discussed. Self-Enrollment in Caregiver Support Group of Interest Emphasized. Crisis Support Information, Agencies, Services & Resources Reviewed. Problem Solving Interventions Implemented. Task-Centered Solutions Employed.   Solution-Focused Strategies Activated. Acceptance & Commitment Therapy Performed. Cognitive Behavioral Therapy Initiated. Client-Centered Therapy Indicated. Continued Emphases on Quitting Smoking & Smoking Cessation Classes, Services & Resources Offered. Please Review the Following List of Express Scripts, [employer], [employer], Mailed on 10/31/2022 & 11/14/2022, Due to Non-Receipt on 10/31/2022: ~ Adult Day Care Programs  ~ [place] ~ [place] ~ [place] ~ 2023 Medicaid Tips ~ Medicaid Application ~ [place] Application  ~ [place] Providers ~ Advanced Directives  Please Be Prepared to Review List of Express [employer]s, [employer], As Well As Aeronautical engineer for [place], During Next Scheduled Telephone [employer]. Please Consider Completion of Advanced Directives ([place]), Notifying CSW Directly [phone]), if You Have Questions or Need Assistance with Completion.      Our next appointment is by telephone on 11/28/2022 at 1:30 pm.  Please call the care guide team at [phone] if you need to cancel or reschedule your appointment.   If you are experiencing a Mental Health or [place] or need someone to talk to, please call the Suicide and Crisis Lifeline: 988 call the [place] National Suicide Prevention Lifeline: 618-118-6212 or TTY: 918-218-5891 TTY (253)354-0642) to talk to a trained counselor call 1-800-273-TALK (toll free, 24  hour hotline) go to [facility] [address], [place] [phone]) call the [place]: [phone] call 911  Patient verbalizes understanding of instructions and care plan provided today and agrees to view in [place]. Active MyChart status  and patient understanding of how to access instructions and care plan via MyChart confirmed with patient.     Telephone follow up appointment with care management team member scheduled for:  11/28/2022 at 1:30 pm  Nat Christen, BSW, MSW, [place]  Licensed Clinical Social Worker  [place]  Mailing [place] [address], [place], [place] [address] Physical Address[address] [place], [place] [address] Toll Free Main # [phone] Fax # [phone] Cell # [phone] [email or web address]

## 2022-11-26 NOTE — Progress Notes (Unsigned)
[facility]     Cardiology Office Note   Date:  11/27/2022   ID:  Eric, Saunders Aug 04, 1956, MRN [id number]  PCP:  [name], FNP  Cardiologist:   Minus Breeding, MD   Chief Complaint  Patient presents with   Cough     History of Present Illness: Eric Saunders is a 67 y.o. male who presents for followup after CABG. in 2020 he had some increased dyspnea.  I sent him for a POET (Plain Old Exercise Treadmill) and he had no evidence of ischemia.   He did have a bit of a hypertensive blood pressure response.  He was hospitalized in 2021 with syncope.  He has had about five or six episodes in the last 6 months.  These are always associated with cough.  He had an EEG which was negative.  MRI brain was unremarkable.  Carotid Dopplers were unremarkable.  He is being treated with higher dose Prilosec 40 mg twice daily and is having less cough and less syncope.  He had his esophagus stretched.  At the last visit he had had less coughing and no syncope.  He was in the ED with a TIA in Dec.  I reviewed these records for this visit.  .  Work up was negative for any acute neurologic findings.  I do see that he had CT angiography with 50% carotid stenosis and he did see vascular surgery in the office after this balloon he does not recall.  He is being managed with risk reduction.  His big issue continues to be cough.  This has been evaluated by pulmonary.  It happens when he is at rest.  He feels like there is something wrong with his tonsils and he might have sinus issues as well.  There is been no cardiac etiology.  There is been no pulmonary etiology other than his continued smoking which rarely contributes.  He has been educated about this.  He is not having any new chest pressure, neck or arm discomfort.  He is not having any new shortness of breath, PND or orthopnea.  He does get anxious.  He tries to walk for anxiety.   Past Medical History:  Diagnosis Date   Arthritis    Carotid  artery occlusion    COPD (chronic obstructive pulmonary disease) (HCC)    Coronary artery disease    Depression    GERD (gastroesophageal reflux disease)    History of angina    Hyperlipemia    Hypertension    Panic disorder    Tobacco abuse     Past Surgical History:  Procedure Laterality Date   COLONOSCOPY     CORONARY ARTERY BYPASS GRAFT N/A 06/17/2014   Procedure: CORONARY ARTERY BYPASS GRAFTING (CABG) X 4, RIGHT LEG EVH;  Surgeon: Gaye Pollack, MD;  Location: [facility];  Service: Open Heart Surgery;  Laterality: N/A;   KNEE SURGERY     bilateral arthroscopic   LEFT HEART CATHETERIZATION WITH CORONARY ANGIOGRAM N/A 06/16/2014   Procedure: LEFT HEART CATHETERIZATION WITH CORONARY ANGIOGRAM;  Surgeon: Burnell Blanks, MD;  Location: [facility] CATH LAB;  Service: Cardiovascular;  Laterality: N/A;   TEE WITHOUT CARDIOVERSION N/A 06/17/2014   Procedure: TRANSESOPHAGEAL ECHOCARDIOGRAM (TEE);  Surgeon: Gaye Pollack, MD;  Location: [facility];  Service: Open Heart Surgery;  Laterality: N/A;   UPPER GASTROINTESTINAL ENDOSCOPY       Current Outpatient Medications  Medication Sig Dispense Refill   ALPRAZolam (XANAX) 0.5 MG tablet Take 1  tablet (0.5 mg total) by mouth 2 (two) times daily as needed for anxiety. 60 tablet 2   amLODipine (NORVASC) 10 MG tablet Take 1 tablet (10 mg total) by mouth daily. 90 tablet 1   aspirin 81 MG tablet Take 81 mg by mouth daily.     benzonatate (TESSALON) 100 MG capsule TAKE 1 CAPSULE BY MOUTH THREE TIMES A DAY AS NEEDED FOR COUGH (Patient taking differently: Take 100 mg by mouth 3 (three) times daily as needed for cough.) 20 capsule 0   cloNIDine (CATAPRES) 0.1 MG tablet Take 1 tablet (0.1 mg total) by mouth 2 (two) times daily. 180 tablet 1   cyclobenzaprine (FLEXERIL) 10 MG tablet Take 1 tablet (10 mg total) by mouth 2 (two) times daily as needed. 30 tablet 5   escitalopram (LEXAPRO) 20 MG tablet Take 2 tablets (40 mg total) by mouth daily. 180 tablet 1    fenofibrate 160 MG tablet Take 1 tablet (160 mg total) by mouth daily. 90 tablet 1   ibuprofen (ADVIL) 200 MG tablet Take 800 mg by mouth every 8 (eight) hours as needed for moderate pain (for knee or back pain).     losartan (COZAAR) 100 MG tablet Take 1 tablet (100 mg total) by mouth daily. 90 tablet 1   metoprolol succinate (TOPROL-XL) 25 MG 24 hr tablet Take 1 tablet (25 mg total) by mouth daily. 90 tablet 1   omeprazole (PRILOSEC) 40 MG capsule Take 1 capsule (40 mg total) by mouth 2 (two) times daily. (Patient taking differently: Take 40 mg by mouth daily.) 180 capsule 1   sildenafil (VIAGRA) 100 MG tablet Take 100 mg by mouth as needed for erectile dysfunction.     triamcinolone cream (KENALOG) 0.1 % Apply 1 application topically 2 (two) times daily. (Patient taking differently: Apply 1 application  topically daily as needed (for itching on arms).) 453.6 g 1   hydrochlorothiazide (HYDRODIURIL) 25 MG tablet Take 1 tablet (25 mg total) by mouth daily. (Patient not taking: Reported on 11/27/2022) 90 tablet 1   rosuvastatin (CRESTOR) 40 MG tablet Take 1 tablet (40 mg total) by mouth daily. 90 tablet 1   No current facility-administered medications for this visit.    Allergies:   Ace inhibitors    ROS:  Please see the history of present illness.   Otherwise, review of systems are positive for none.   All other systems are reviewed and negative.    PHYSICAL EXAM: VS:  BP 110/70   Pulse 76   Ht '5\' 8"'$  (1.727 m)   Wt 194 lb (88 kg)   BMI 29.50 kg/m  , BMI Body mass index is 29.5 kg/m. GENERAL:  Well appearing NECK:  No jugular venous distention, waveform within normal limits, carotid upstroke brisk and symmetric, no bruits, no thyromegaly LUNGS:  Clear to auscultation bilaterally CHEST:  Well healed sternotomy scar. HEART:  PMI not displaced or sustained,S1 and S2 within normal limits, no S3, no S4, no clicks, no rubs, no murmurs ABD:  Flat, positive bowel sounds normal in frequency in  pitch, no bruits, no rebound, no guarding, no midline pulsatile mass, no hepatomegaly, no splenomegaly EXT:  2 plus pulses upper and mildly decreased dorsalis pedis and posterior tibialis bilateral, no edema, no cyanosis no clubbing   EKG:  EKG is not ordered today. Sinus rhythm, rate 61, axis within normal limits, intervals within normal limits, no acute ST-T wave changes.  08/24/2022  Recent Labs: 08/24/2022: ALT 21; BUN 26; Creatinine, Ser  1.00; Hemoglobin 13.9; Platelets 289; Potassium 4.6; Sodium 135 08/25/2022: Magnesium 2.1; TSH 2.047    Lipid Panel    Component Value Date/Time   CHOL 134 08/25/2022 0855   CHOL 157 07/01/2022 0916   CHOL 166 12/28/2012 0932   TRIG 132 08/25/2022 0855   TRIG 157 (H) 11/23/2014 1549   TRIG 129 12/28/2012 0932   HDL 34 (L) 08/25/2022 0855   HDL 50 07/01/2022 0916   HDL 44 11/23/2014 1549   HDL 42 12/28/2012 0932   CHOLHDL 3.9 08/25/2022 0855   VLDL 26 08/25/2022 0855   LDLCALC 74 08/25/2022 0855   LDLCALC 90 07/01/2022 0916   LDLCALC 90 07/06/2014 0000   LDLCALC 98 12/28/2012 0932      Wt Readings from Last 3 Encounters:  11/27/22 194 lb (88 kg)  10/08/22 195 lb 12.8 oz (88.8 kg)  08/29/22 189 lb (85.7 kg)      Other studies Reviewed: Additional studies/ records that were reviewed today include: None Review of the above records demonstrates:  Please see elsewhere in the note.     ASSESSMENT AND PLAN:  CAD/CABG:   The patient has no new sypmtoms.  No further cardiovascular testing is indicated.  We will continue with aggressive risk reduction and meds as listed.  SYNCOPE:    He has had no further frank syncope but only gets episodes of near syncope with his coughing.  TOBACCO: Again we talked about the need to stop smoking and he is trying to cut down.  DYSLIPIDEMIA:   LDL was improved when I increased his Lipitor last year.  LDL is 74 with an HDL of 34.  He will continue the meds as listed.  HTN:  The blood pressure is at  good.  No change in therapy.   COUGH: I am going to send him for ENT evaluation.  PVD: He has known carotid disease and we are managing this with risk reduction.  Current medicines are reviewed at length with the patient today.  The patient does not have concerns regarding medicines.  The following changes have been made: None  Labs/ tests ordered today include: None  Orders Placed This Encounter  Procedures   Ambulatory referral to ENT     Disposition:   FU with me in 12 months.    Signed, Minus Breeding, MD  11/27/2022 2:45 PM    [facility]

## 2022-11-27 ENCOUNTER — Ambulatory Visit: Payer: BC Managed Care – PPO | Admitting: Cardiology

## 2022-11-27 ENCOUNTER — Encounter: Payer: Self-pay | Admitting: Cardiology

## 2022-11-27 VITALS — BP 110/70 | HR 76 | Ht 68.0 in | Wt 194.0 lb

## 2022-11-27 DIAGNOSIS — I2510 Atherosclerotic heart disease of native coronary artery without angina pectoris: Secondary | ICD-10-CM

## 2022-11-27 DIAGNOSIS — R053 Chronic cough: Secondary | ICD-10-CM

## 2022-11-27 DIAGNOSIS — I10 Essential (primary) hypertension: Secondary | ICD-10-CM

## 2022-11-27 DIAGNOSIS — G459 Transient cerebral ischemic attack, unspecified: Secondary | ICD-10-CM

## 2022-11-27 DIAGNOSIS — E785 Hyperlipidemia, unspecified: Secondary | ICD-10-CM

## 2022-11-27 NOTE — Patient Instructions (Signed)
Medication Instructions:  The current medical regimen is effective;  continue present plan and medications.  *If you need a refill on your cardiac medications before your next appointment, please call your pharmacy*   Testing/Procedures: You have been referred to ENT for evaluation and treatment of chronic cough.  You will be contacted to be scheduled.   Follow-Up: At [facility], you and your health needs are our priority.  As part of our continuing mission to provide you with exceptional heart care, we have created designated Provider Care Teams.  These Care Teams include your primary Cardiologist (physician) and Advanced Practice Providers (APPs -  Physician Assistants and Nurse Practitioners) who all work together to provide you with the care you need, when you need it.  We recommend signing up for the patient portal called "MyChart".  Sign up information is provided on this After Visit Summary.  MyChart is used to connect with patients for Virtual Visits (Telemedicine).  Patients are able to view lab/test results, encounter notes, upcoming appointments, etc.  Non-urgent messages can be sent to your provider as well.   To learn more about what you can do with MyChart, go to [email or web address].    Your next appointment:   1 year(s)  Provider:   Minus Breeding, MD

## 2022-11-28 ENCOUNTER — Ambulatory Visit: Payer: Self-pay | Admitting: *Deleted

## 2022-11-28 NOTE — Patient Outreach (Signed)
  Care Coordination   11/28/2022  Name: Eric Saunders MRN: [id number] DOB: 01/05/1956   Care Coordination Outreach Attempts:  An unsuccessful telephone outreach was attempted today to offer the patient information about available care coordination services as a benefit of their health plan. HIPAA compliant messages left on voicemail, for both patient and wife, providing contact information for CSW, encouraging them to return CSW's call at their earliest convenience.  Follow Up Plan:  Additional outreach attempts will be made to offer the patient care coordination information and services.   Encounter Outcome:  No Answer.   Care Coordination Interventions:  No, not indicated.    Nat Christen, BSW, MSW, LCSW  Licensed Education officer, [facility]  Mailing [place] [address], [place], [place] [address] Physical Address-[address] [place], [place] [address] Toll Free Main # [phone] Fax # [phone] Cell # [phone] [email or web address]

## 2022-11-29 ENCOUNTER — Telehealth: Payer: Self-pay | Admitting: *Deleted

## 2022-11-29 NOTE — Progress Notes (Signed)
  Care Coordination Note  11/29/2022 Name: Eric Saunders MRN: [id number] DOB: 1956/06/28  Eric Saunders is a 67 y.o. year old male who is a primary care patient of [name], [place] and is actively engaged with the care management team. I reached out to [name] by phone today to assist with re-scheduling a follow up visit with the RN Case Manager  Follow up plan: Unsuccessful telephone outreach attempt made. A HIPAA compliant phone message was left for the patient providing contact information and requesting a return call.   [place]  Direct Dial: [phone]

## 2022-11-29 NOTE — Progress Notes (Signed)
  Care Coordination Note  11/29/2022 Name: KIREN HAVRON MRN: [id number] DOB: March 13, 1956  TAYJON TRETTEL is a 67 y.o. year old male who is a primary care patient of [name], [place] and is actively engaged with the care management team. I reached out to [name] by phone today to assist with re-scheduling a follow up visit with the Licensed Clinical Social Worker  Follow up plan: Telephone appointment with care management team member scheduled for:12/06/22  [place]: [phone]

## 2022-12-01 ENCOUNTER — Other Ambulatory Visit: Payer: Self-pay | Admitting: Nurse Practitioner

## 2022-12-01 DIAGNOSIS — K219 Gastro-esophageal reflux disease without esophagitis: Secondary | ICD-10-CM

## 2022-12-01 DIAGNOSIS — F411 Generalized anxiety disorder: Secondary | ICD-10-CM

## 2022-12-01 DIAGNOSIS — E785 Hyperlipidemia, unspecified: Secondary | ICD-10-CM

## 2022-12-01 DIAGNOSIS — I25810 Atherosclerosis of coronary artery bypass graft(s) without angina pectoris: Secondary | ICD-10-CM

## 2022-12-02 ENCOUNTER — Ambulatory Visit: Payer: Self-pay | Admitting: *Deleted

## 2022-12-02 NOTE — Patient Outreach (Addendum)
  Care Coordination   Follow Up Visit Note   12/02/2022 Name: Eric Saunders MRN: [id number] DOB: [birth year]  Eric Saunders is a 67 y.o. year old male who sees Eric Saunders, [place] for primary care. I spoke with  Eric Saunders by phone today.  What matters to the patients health and wellness today?  Follow up cardiology visit, interactions with [facility]    Goals Addressed               This Visit's Progress     Patient Stated     Decrease caregiver burnout [facility]) (pt-stated)   On track     Care Coordination Interventions: Discussed plans with patient for ongoing care management follow up and provided patient with direct contact information for care management team Active listening Encouragement provided  Interventions Today    Flowsheet Row Most Recent Value  Chronic Disease   Chronic disease during today's visit Hypertension (HTN), Other  General Interventions   General Interventions Discussed/Reviewed General Interventions Reviewed, Doctor Visits, [employer], Communication with  Communication with Social Work  [updated [facility] SW %& [place] how and best times to reach patient via EPIC message]  Exercise Interventions   Exercise Discussed/Reviewed Exercise Reviewed, Physical Activity  Physical Activity Discussed/Reviewed Physical Activity Reviewed, Home Exercise Program (HEP)  [continue to be activity outside of his home and caring for wife]  Education Interventions   Education Provided Provided Education  Provided Verbal Education On Other  [Reviewed and emphasize the cardiologist recommendation for smoke cessation]  Mental Health Interventions   Mental Health Discussed/Reviewed Coping Strategies, Mental Health Reviewed  Eric Saunders he is coping better as his wife is coping better as she cares for her aunt in nursing facility]          Make and Keep medical appointments [facility]) (pt-stated)   On track     Care Coordination Interventions: Discussed plans with patient  for ongoing care management follow up and provided patient with direct contact information for care management team Interventions Today    Flowsheet Row Most Recent Value  Chronic Disease   Chronic disease during today's visit Hypertension (HTN), Other  General Interventions   General Interventions Discussed/Reviewed General Interventions Reviewed, Doctor Visits, [employer], Communication with  Communication with Social Work  [updated [facility] SW %& [facility] CMA how and best times to reach patient via EPIC message]  Exercise Interventions   Exercise Discussed/Reviewed Exercise Reviewed, Physical Activity  Physical Activity Discussed/Reviewed Physical Activity Reviewed, Home Exercise Program (HEP)  [continue to be activity outside of his home and caring for wife]  Education Interventions   Education Provided Provided Education  Provided Verbal Education On Other  [Reviewed and emphasize the cardiologist recommendation for smoke cessation]  Mental Health Interventions   Mental Health Discussed/Reviewed Coping Strategies, Mental Health Reviewed  Eric Saunders he is coping better as his wife is coping better as she cares for her aunt in nursing facility]             SDOH assessments and interventions completed:  No     Care Coordination Interventions:  Yes, provided   Follow up plan: Follow up call scheduled for 01/31/23    Encounter Outcome:  Pt. Visit Completed   Latysha Thackston L. Lavina Hamman, RN, BSN, [place] Coordinator Office number [phone]

## 2022-12-02 NOTE — Patient Instructions (Addendum)
Visit Information  Thank you for taking time to visit with me today. Please don't hesitate to contact me if I can be of assistance to you.   Following are the goals we discussed today:   Goals Addressed               This Visit's Progress     Patient Stated     Decrease caregiver burnout [facility]) (pt-stated)   On track     Care Coordination Interventions: Discussed plans with patient for ongoing care management follow up and provided patient with direct contact information for care management team Active listening Encouragement provided  Interventions Today    Flowsheet Row Most Recent Value  Chronic Disease   Chronic disease during today's visit Hypertension (HTN), Other  General Interventions   General Interventions Discussed/Reviewed General Interventions Reviewed, Doctor Visits, [employer], Communication with  Communication with Social Work  [updated [facility] SW %& [facility] how and best times to reach patient via EPIC message]  Exercise Interventions   Exercise Discussed/Reviewed Exercise Reviewed, Physical Activity  Physical Activity Discussed/Reviewed Physical Activity Reviewed, Home Exercise Program (HEP)  [continue to be activity outside of his home and caring for wife]  Education Interventions   Education Provided Provided Education  Provided Verbal Education On Other  [Reviewed and emphasize the cardiologist recommendation for smoke cessation]  Mental Health Interventions   Mental Health Discussed/Reviewed Coping Strategies, Mental Health Reviewed  Harlow Mares he is coping better as his wife is coping better as she cares for her aunt in nursing facility]          Make and Keep medical appointments [facility]) (pt-stated)   On track     Care Coordination Interventions: Discussed plans with patient for ongoing care management follow up and provided patient with direct contact information for care management team Interventions Today    Flowsheet Row Most Recent Value   Chronic Disease   Chronic disease during today's visit Hypertension (HTN), Other  General Interventions   General Interventions Discussed/Reviewed General Interventions Reviewed, Doctor Visits, [employer], Communication with  Communication with Social Work  [updated [facility] SW %& [facility] CMA how and best times to reach patient via EPIC message]  Exercise Interventions   Exercise Discussed/Reviewed Exercise Reviewed, Physical Activity  Physical Activity Discussed/Reviewed Physical Activity Reviewed, Home Exercise Program (HEP)  [continue to be activity outside of his home and caring for wife]  Education Interventions   Education Provided Provided Education  Provided Verbal Education On Other  [Reviewed and emphasize the cardiologist recommendation for smoke cessation]  Mental Health Interventions   Mental Health Discussed/Reviewed Coping Strategies, Mental Health Reviewed  Harlow Mares he is coping better as his wife is coping better as she cares for her aunt in nursing facility]             Our next appointment is by telephone on 01/31/23 at 1100  Please call the care guide team at [phone] if you need to cancel or reschedule your appointment.   If you are experiencing a Mental Health or [place] or need someone to talk to, please call the Suicide and Crisis Lifeline: 988 call the [place] National Suicide Prevention Lifeline: (810) 340-5005 or TTY: 361-613-1187 TTY (351)183-1078) to talk to a trained counselor call 1-800-273-TALK (toll free, 24 hour hotline) call the [facility]: 330-631-7100 call 911   Patient verbalizes understanding of instructions and care plan provided today and agrees to view in [place]. Active MyChart status and patient understanding of how  to access instructions and care plan via MyChart confirmed with patient.     The patient has been provided with contact information for the care management team and has been advised to call  with any health related questions or concerns.   Lakesia Dahle L. Lavina Hamman, RN, BSN, [place] Coordinator Office number [phone]

## 2022-12-06 ENCOUNTER — Ambulatory Visit: Payer: Self-pay | Admitting: *Deleted

## 2022-12-06 ENCOUNTER — Encounter: Payer: Self-pay | Admitting: *Deleted

## 2022-12-06 NOTE — Patient Instructions (Signed)
Visit Information  Thank you for taking time to visit with me today. Please don't hesitate to contact me if I can be of assistance to you.   Following are the goals we discussed today:   Goals Addressed               This Visit's Progress     COMPLETED: Receive Assistance Obtaining Caregiver Supportive Services and Resources. (pt-stated)   On track     Interventions Today    Flowsheet Row Most Recent Value  Chronic Disease   Chronic disease during today's visit Hypertension (HTN), Other  [Generalized anxiety disorder, panic attacks, tobacco abuse,history of TIA]  General Interventions   General Interventions Discussed/Reviewed General Interventions Discussed, Durable Medical Equipment (DME), Health Screening, Community Resources, Doctor Visits, Communication with  Doctor Visits Discussed/Reviewed Doctor Visits Discussed, Doctor Visits Reviewed, PCP, Specialist  Communication with PCP/Specialists, RN, Pharmacists  Exercise Interventions   Exercise Discussed/Reviewed Exercise Discussed, Physical Activity  Physical Activity Discussed/Reviewed Physical Activity Discussed, Types of exercise  Education Interventions   Education Provided Provided Printed Education, Provided Web-based Education  [place] Discussed, Anxiety, Coping Strategies, Suicide  Pharmacy Interventions   Pharmacy Dicussed/Reviewed Medications and their functions, Medication Adherence, Affording Medications, Referral to Pharmacist  Safety Interventions   Safety Discussed/Reviewed Safety Discussed, Safety Reviewed, Fall Risk  Advanced Directive Interventions   Advanced Directives Discussed/Reviewed Advanced Directives Discussed, Provided resource for acquiring and filling out documents      Care Coordination Interventions:  Active Listening & Reflection Utilized.  Verbalization of Feelings Encouraged.  Emotional Support Provided. Feelings of  Caregiver Burnout Validated. Caregiver Stress Acknowledged. Caregiver Resources Reviewed. Caregiver Support Groups Discussed. Self-Enrollment in Caregiver Support Group of Interest Emphasized. Crisis Support Information, Agencies, Services & Resources Reviewed. Problem Solving Interventions Implemented. Task-Centered Solutions Employed.   Solution-Focused Strategies Activated. Acceptance & Commitment Therapy Performed. Cognitive Behavioral Therapy Initiated. Client-Centered Therapy Indicated. Continued Emphases on Quitting Smoking & Smoking Cessation Classes, Services & Resources Offered. Confirmed Receipt & Thoroughly Reviewed the Following List of Express Scripts, Hospital doctor, Resources & Applications: ~ Adult Day Care Programs  ~ [place] ~ [place] ~ [place] ~ 2023 Medicaid Tips ~ Medicaid Application ~ [place] Application  ~ [place] Providers ~ Advanced Directives  Please Contact CSW Directly (# [phone]), if You Have Questions, Need Assistance with Referrals, Completion & Submission of Applications, or If Additional Social Work Needs Are Identified in The Near Future.      Please call the care guide team at [phone] if you need to cancel or reschedule your appointment.   If you are experiencing a Mental Health or [place] or need someone to talk to, please call the Suicide and Crisis Lifeline: 988 call the [place] National Suicide Prevention Lifeline: 580-318-0529 or TTY: 475-595-4862 TTY 225-464-0223) to talk to a trained counselor call 1-800-273-TALK (toll free, 24 hour hotline) go to [facility] [address] [place] [phone]) call the [place]: [phone] call 911  Patient verbalizes understanding of instructions and care plan provided today and agrees to view in [place].  Active MyChart status and patient understanding of how to access instructions and care plan via MyChart confirmed with patient.     No further follow up required.  Nat Christen, BSW, MSW, [place]  Licensed Education officer, environmental Health  System  Mailing Address-[address] [place], [place] [address] Physical Address-[address], [place], [place] [address] Toll Free Main # [phone] Fax # [phone] Cell # [phone] [email or web address]

## 2022-12-06 NOTE — Patient Outreach (Signed)
  Care Coordination   Follow Up Visit Note   12/06/2022  Name: Eric Saunders MRN: [id number] DOB: 08-13-1956  Eric Saunders is a 67 y.o. year old male who sees Eric Saunders, [place] [facility] [place] for primary care. I spoke with Eric Saunders by phone today.  What matters to the patients health and wellness today?   Receive Assistance Obtaining Caregiver Supportive Services and Resources.   Goals Addressed               This Visit's Progress     COMPLETED: Receive Assistance Obtaining Caregiver Supportive Services and Resources. (pt-stated)   On track     Interventions Today    Flowsheet Row Most Recent Value  Chronic Disease   Chronic disease during today's visit Hypertension (HTN), Other  [Generalized anxiety disorder, panic attacks, tobacco abuse,history of TIA]  General Interventions   General Interventions Discussed/Reviewed General Interventions Discussed, Durable Medical Equipment (DME), Health Screening, Community Resources, Doctor Visits, Communication with  Doctor Visits Discussed/Reviewed Doctor Visits Discussed, Doctor Visits Reviewed, PCP, Specialist  Communication with PCP/Specialists, RN, Pharmacists  Exercise Interventions   Exercise Discussed/Reviewed Exercise Discussed, Physical Activity  Physical Activity Discussed/Reviewed Physical Activity Discussed, Types of exercise  Education Interventions   Education Provided Provided Printed Education, Provided Web-based Education  [place] Discussed, Anxiety, Coping Strategies, Suicide  Pharmacy Interventions   Pharmacy Dicussed/Reviewed Medications and their functions, Medication Adherence, Affording Medications, Referral to Pharmacist  Safety Interventions   Safety Discussed/Reviewed Safety Discussed, Safety Reviewed, Fall Risk  Advanced Directive Interventions   Advanced Directives Discussed/Reviewed Advanced Directives Discussed, Provided resource for  acquiring and filling out documents      Care Coordination Interventions:  Active Listening & Reflection Utilized.  Verbalization of Feelings Encouraged.  Emotional Support Provided. Feelings of Caregiver Burnout Validated. Caregiver Stress Acknowledged. Caregiver Resources Reviewed. Caregiver Support Groups Discussed. Self-Enrollment in Caregiver Support Group of Interest Emphasized. Crisis Support Information, Agencies, Services & Resources Reviewed. Problem Solving Interventions Implemented. Task-Centered Solutions Employed.   Solution-Focused Strategies Activated. Acceptance & Commitment Therapy Performed. Cognitive Behavioral Therapy Initiated. Client-Centered Therapy Indicated. Continued Emphases on Quitting Smoking & Smoking Cessation Classes, Services & Resources Offered. Confirmed Receipt & Thoroughly Reviewed the Following List of Express Scripts, Hospital doctor, Resources & Applications: ~ Adult Day Care Programs  ~ [place] ~ [place] ~ [place] ~ 2023 Medicaid Tips ~ Medicaid Application ~ [place] Application  ~ [place] Providers ~ Advanced Directives  Please Contact CSW Directly (# [phone]), if You Have Questions, Need Assistance with Referrals, Completion & Submission of Applications, or If Additional Social Work Needs Are Identified in The Near Future.      SDOH assessments and interventions completed:  Yes.  Care Coordination Interventions:  Yes, provided.   Follow up plan: No further intervention required.   Encounter Outcome:  Pt. Visit Completed.   Eric Saunders, BSW, MSW, LCSW  Licensed Education officer, environmental Health System  Mailing [address], [place], [place] [address] Physical Address-[address] [place], [place] [address] Toll Free Main # [phone] Fax # [phone] Cell #  [phone] [email or web address]

## 2022-12-31 ENCOUNTER — Ambulatory Visit: Payer: BC Managed Care – PPO | Admitting: Nurse Practitioner

## 2023-01-13 ENCOUNTER — Other Ambulatory Visit: Payer: Self-pay | Admitting: Nurse Practitioner

## 2023-01-13 DIAGNOSIS — I25810 Atherosclerosis of coronary artery bypass graft(s) without angina pectoris: Secondary | ICD-10-CM

## 2023-01-13 DIAGNOSIS — I10 Essential (primary) hypertension: Secondary | ICD-10-CM

## 2023-01-13 DIAGNOSIS — F411 Generalized anxiety disorder: Secondary | ICD-10-CM

## 2023-01-13 DIAGNOSIS — K219 Gastro-esophageal reflux disease without esophagitis: Secondary | ICD-10-CM

## 2023-01-13 DIAGNOSIS — E785 Hyperlipidemia, unspecified: Secondary | ICD-10-CM

## 2023-01-14 ENCOUNTER — Ambulatory Visit: Payer: BC Managed Care – PPO | Admitting: Nurse Practitioner

## 2023-01-14 ENCOUNTER — Encounter: Payer: Self-pay | Admitting: Nurse Practitioner

## 2023-01-14 VITALS — BP 120/74 | HR 66 | Temp 97.7°F | Resp 20 | Ht 68.0 in | Wt 185.0 lb

## 2023-01-14 DIAGNOSIS — I10 Essential (primary) hypertension: Secondary | ICD-10-CM | POA: Diagnosis not present

## 2023-01-14 DIAGNOSIS — E785 Hyperlipidemia, unspecified: Secondary | ICD-10-CM | POA: Diagnosis not present

## 2023-01-14 DIAGNOSIS — Z6828 Body mass index (BMI) 28.0-28.9, adult: Secondary | ICD-10-CM

## 2023-01-14 DIAGNOSIS — I2510 Atherosclerotic heart disease of native coronary artery without angina pectoris: Secondary | ICD-10-CM

## 2023-01-14 DIAGNOSIS — E669 Obesity, unspecified: Secondary | ICD-10-CM

## 2023-01-14 DIAGNOSIS — J3489 Other specified disorders of nose and nasal sinuses: Secondary | ICD-10-CM

## 2023-01-14 DIAGNOSIS — J449 Chronic obstructive pulmonary disease, unspecified: Secondary | ICD-10-CM | POA: Diagnosis not present

## 2023-01-14 DIAGNOSIS — I25810 Atherosclerosis of coronary artery bypass graft(s) without angina pectoris: Secondary | ICD-10-CM

## 2023-01-14 DIAGNOSIS — Z125 Encounter for screening for malignant neoplasm of prostate: Secondary | ICD-10-CM

## 2023-01-14 DIAGNOSIS — K219 Gastro-esophageal reflux disease without esophagitis: Secondary | ICD-10-CM

## 2023-01-14 DIAGNOSIS — F411 Generalized anxiety disorder: Secondary | ICD-10-CM

## 2023-01-14 MED ORDER — LOSARTAN POTASSIUM 100 MG PO TABS
100.0000 mg | ORAL_TABLET | Freq: Every day | ORAL | 1 refills | Status: DC
Start: 2023-01-14 — End: 2023-07-07

## 2023-01-14 MED ORDER — FENOFIBRATE 160 MG PO TABS
160.0000 mg | ORAL_TABLET | Freq: Every day | ORAL | 1 refills | Status: DC
Start: 2023-01-14 — End: 2023-07-07

## 2023-01-14 MED ORDER — METOPROLOL SUCCINATE ER 25 MG PO TB24: 25.0000 mg | ORAL_TABLET | Freq: Every day | ORAL | 1 refills | Status: DC

## 2023-01-14 MED ORDER — CLONIDINE HCL 0.1 MG PO TABS: 0.1000 mg | ORAL_TABLET | Freq: Two times a day (BID) | ORAL | 1 refills | Status: DC

## 2023-01-14 MED ORDER — AMLODIPINE BESYLATE 10 MG PO TABS: 10.0000 mg | ORAL_TABLET | Freq: Every day | ORAL | 1 refills | Status: DC

## 2023-01-14 MED ORDER — ALPRAZOLAM 0.5 MG PO TABS
0.5000 mg | ORAL_TABLET | Freq: Two times a day (BID) | ORAL | 2 refills | Status: DC | PRN
Start: 2023-01-14 — End: 2023-07-07

## 2023-01-14 MED ORDER — OMEPRAZOLE 40 MG PO CPDR
40.0000 mg | DELAYED_RELEASE_CAPSULE | Freq: Two times a day (BID) | ORAL | 1 refills | Status: DC
Start: 2023-01-14 — End: 2023-07-07

## 2023-01-14 MED ORDER — ESCITALOPRAM OXALATE 20 MG PO TABS: 40.0000 mg | ORAL_TABLET | Freq: Every day | ORAL | 1 refills | Status: DC

## 2023-01-14 MED ORDER — ROSUVASTATIN CALCIUM 40 MG PO TABS
40.0000 mg | ORAL_TABLET | Freq: Every day | ORAL | 1 refills | Status: DC
Start: 2023-01-14 — End: 2023-07-07
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
## 2023-01-17 LAB — TOXASSURE SELECT 13 (MW), URINE

## 2023-01-31 ENCOUNTER — Ambulatory Visit: Payer: Self-pay | Admitting: *Deleted

## 2023-01-31 NOTE — Patient Instructions (Addendum)
Visit Information  Thank you for taking time to visit with me today. Please don't hesitate to contact me if I can be of assistance to you.   Following are the goals we discussed today:   Goals Addressed               This Visit's Progress     Patient Stated     COMPLETED: Decrease caregiver burnout [facility]) (pt-stated)   On track     Care Coordination Interventions: duplicate goal, closure       COMPLETED: Make and Keep medical appointments [facility]) (pt-stated)   On track     Care Coordination Interventions: Goal met      COMPLETED: Pain management [facility]) (pt-stated)   On track     Started 04/23/22  Care Coordination Interventions: duplicate goal, closure       Other     [facility] Care Coordination Services   On track     Care Coordination Interventions: Interventions Today    Flowsheet Row Most Recent Value  Chronic Disease   Chronic disease during today's visit Other  [soreness all over, smoke cessation, covid precautions, weight gain]  General Interventions   General Interventions Discussed/Reviewed General Interventions Reviewed, Vaccines, [facility], Doctor Visits  Vaccines COVID-19  Doctor Visits Discussed/Reviewed Doctor Visits Reviewed, PCP, Specialist  PCP/Specialist Visits Compliance with follow-up visit  Exercise Interventions   Exercise Discussed/Reviewed Exercise Reviewed, Weight Managment  Physical Activity Discussed/Reviewed Physical Activity Reviewed  Weight Management Weight loss  Education Interventions   Education Provided Provided Web-based Education, Provided Education  [weight loss and smoke cessation]  Provided Verbal Education On Other  [smoke cessation, lifting and transferring for caregivers]  Mental Health Interventions   Mental Health Discussed/Reviewed Mental Health Reviewed, Coping Strategies  Nutrition Interventions   Nutrition Discussed/Reviewed Nutrition Discussed, Decreasing salt  Pharmacy Interventions   Pharmacy Dicussed/Reviewed  Pharmacy Topics Discussed, Affording Medications  Safety Interventions   Safety Discussed/Reviewed Safety Discussed, Home Safety  Home Safety --  [caution with care given to wife who has gained weight/ back pain prevention]      Interventions Today    Flowsheet Row Most Recent Value  Chronic Disease   Chronic disease during today's visit Other  [soreness all over, smoke cessation, covid precautions, weight gain]  General Interventions   General Interventions Discussed/Reviewed General Interventions Reviewed, Vaccines, [facility], Doctor Visits  Vaccines COVID-19  Doctor Visits Discussed/Reviewed Doctor Visits Reviewed, PCP, Specialist  PCP/Specialist Visits Compliance with follow-up visit  Exercise Interventions   Exercise Discussed/Reviewed Exercise Reviewed, Weight Managment  Physical Activity Discussed/Reviewed Physical Activity Reviewed  Weight Management Weight loss  Education Interventions   Education Provided Provided Web-based Education, Provided Education  [weight loss and smoke cessation]  Provided Verbal Education On Other  [smoke cessation, lifting and transferring for caregivers]  Mental Health Interventions   Mental Health Discussed/Reviewed Mental Health Reviewed, Coping Strategies  Nutrition Interventions   Nutrition Discussed/Reviewed Nutrition Discussed, Decreasing salt  Pharmacy Interventions   Pharmacy Dicussed/Reviewed Pharmacy Topics Discussed, Affording Medications  Safety Interventions   Safety Discussed/Reviewed Safety Discussed, Home Safety  Home Safety --  [caution with care given to wife who has gained weight/ back pain prevention]             Our next appointment is by telephone on 03/03/23 at 1130  Please call the care guide team at [phone] if you need to cancel or reschedule your appointment.   If you are experiencing a Mental Health or Behavioral Health Crisis  or need someone to talk to, please call the Suicide and Crisis  Lifeline: 988 call the [place] National Suicide Prevention Lifeline: 7243357264 or TTY: 581-056-3563 TTY (442)688-0270) to talk to a trained counselor call 1-800-273-TALK (toll free, 24 hour hotline) call the [facility]: [phone] call 911   Patient verbalizes understanding of instructions and care plan provided today and agrees to view in MyChart. Active MyChart status and patient understanding of how to access instructions and care plan via MyChart confirmed with patient.     The patient has been provided with contact information for the care management team and has been advised to call with any health related questions or concerns.   Ryn Peine L. Noelle Penner, RN, BSN, CCM [facility] Care Management Community Coordinator Office number [phone]

## 2023-01-31 NOTE — Patient Outreach (Signed)
Care Coordination   Follow Up Visit Note   01/31/2023 Name: Eric Saunders MRN: [id number] DOB: 1956/09/04  Eric Saunders is a 67 y.o. year old male who sees Bennie Pierini, FNP for primary care. I spoke with  Ellison Hughs by phone today.  What matters to the patients health and wellness today?  "Doing well" continues care for his family especially his wife Presently assisting his daughter with shopping tasks Denies medical and social concerns today Discussed care of [name] related to preventing her from receiving covid from her aunt in the nursing home, assisting to connect wife with her mental health provider related to her restarting smoking, and weight gain encouragement    Goals Addressed               This Visit's Progress     Patient Stated     COMPLETED: Decrease caregiver burnout [facility]) (pt-stated)   On track     Care Coordination Interventions: duplicate goal, closure       COMPLETED: Make and Keep medical appointments [facility]) (pt-stated)   On track     Care Coordination Interventions: Goal met      COMPLETED: Pain management [facility]) (pt-stated)   On track     Started 04/23/22  Care Coordination Interventions: duplicate goal, closure       Other     [facility] Care Coordination Services   On track     Care Coordination Interventions: Interventions Today    Flowsheet Row Most Recent Value  Chronic Disease   Chronic disease during today's visit Other  [soreness all over, smoke cessation, covid precautions, weight gain]  General Interventions   General Interventions Discussed/Reviewed General Interventions Reviewed, Vaccines, [facility], Doctor Visits  Vaccines COVID-19  Doctor Visits Discussed/Reviewed Doctor Visits Reviewed, PCP, Specialist  PCP/Specialist Visits Compliance with follow-up visit  Exercise Interventions   Exercise Discussed/Reviewed Exercise Reviewed, Weight Managment  Physical Activity Discussed/Reviewed Physical Activity Reviewed   Weight Management Weight loss  Education Interventions   Education Provided Provided Web-based Education, Provided Education  [weight loss and smoke cessation]  Provided Verbal Education On Other  [smoke cessation, lifting and transferring for caregivers]  Mental Health Interventions   Mental Health Discussed/Reviewed Mental Health Reviewed, Coping Strategies  Nutrition Interventions   Nutrition Discussed/Reviewed Nutrition Discussed, Decreasing salt  Pharmacy Interventions   Pharmacy Dicussed/Reviewed Pharmacy Topics Discussed, Affording Medications  Safety Interventions   Safety Discussed/Reviewed Safety Discussed, Home Safety  Home Safety --  [caution with care given to wife who has gained weight/ back pain prevention]      Interventions Today    Flowsheet Row Most Recent Value  Chronic Disease   Chronic disease during today's visit Other  [soreness all over, smoke cessation, covid precautions, weight gain]  General Interventions   General Interventions Discussed/Reviewed General Interventions Reviewed, Vaccines, [facility], Doctor Visits  Vaccines COVID-19  Doctor Visits Discussed/Reviewed Doctor Visits Reviewed, PCP, Specialist  PCP/Specialist Visits Compliance with follow-up visit  Exercise Interventions   Exercise Discussed/Reviewed Exercise Reviewed, Weight Managment  Physical Activity Discussed/Reviewed Physical Activity Reviewed  Weight Management Weight loss  Education Interventions   Education Provided Provided Web-based Education, Provided Education  [weight loss and smoke cessation]  Provided Verbal Education On Other  [smoke cessation, lifting and transferring for caregivers]  Mental Health Interventions   Mental Health Discussed/Reviewed Mental Health Reviewed, Coping Strategies  Nutrition Interventions   Nutrition Discussed/Reviewed Nutrition Discussed, Decreasing salt  Pharmacy Interventions   Pharmacy Dicussed/Reviewed Pharmacy Topics Discussed,  Affording Medications  Safety Interventions   Safety Discussed/Reviewed Safety Discussed, Home Safety  Home Safety --  [caution with care given to wife who has gained weight/ back pain prevention]             SDOH assessments and interventions completed:  Yes  SDOH Interventions Today    Flowsheet Row Most Recent Value  SDOH Interventions   Food Insecurity Interventions Intervention Not Indicated  Transportation Interventions Intervention Not Indicated  Utilities Interventions Intervention Not Indicated        Care Coordination Interventions:  Yes, provided   Follow up plan: Follow up call scheduled for 03/03/23    Encounter Outcome:  Pt. Visit Completed   Eric Dula L. Noelle Penner, RN, BSN, CCM [facility] Care Management Community Coordinator Office number [phone]

## 2023-02-16 ENCOUNTER — Other Ambulatory Visit: Payer: Self-pay | Admitting: Nurse Practitioner

## 2023-02-28 ENCOUNTER — Encounter: Payer: Self-pay | Admitting: Nurse Practitioner

## 2023-02-28 ENCOUNTER — Ambulatory Visit: Payer: BC Managed Care – PPO | Admitting: Nurse Practitioner

## 2023-02-28 VITALS — BP 137/82 | HR 69 | Temp 97.7°F | Resp 20 | Ht 68.0 in | Wt 181.0 lb

## 2023-02-28 DIAGNOSIS — L723 Sebaceous cyst: Secondary | ICD-10-CM

## 2023-02-28 NOTE — Patient Instructions (Signed)

## 2023-02-28 NOTE — Progress Notes (Signed)
Subjective:    Patient ID: Eric Saunders, male    DOB: September 14, 1956, 67 y.o.   MRN: [id number]   Chief Complaint: removal of sebaceous cyst  HPI  Patient has a sebaceous cyst on right upper back. Has been there for several months. Is sore to the touch. Has gotten some bigger over the last few months. Patient Active Problem List   Diagnosis Date Noted   TIA (transient ischemic attack) 08/25/2022   Combined forms of age-related cataract of right eye 10/16/2021   Hydrocele 07/25/2021   Degeneration of lumbar intervertebral disc 04/10/2021   Subacromial bursitis of left shoulder joint 11/14/2020   Pain of left hand 11/13/2020   Globus sensation 01/19/2020   Gastroesophageal reflux disease 01/19/2020   Tinnitus of both ears 01/19/2020   Contusion of left elbow 01/13/2020   Closed fracture of olecranon process of left ulna 01/03/2020   Chronic obstructive lung disease (HCC) 01/03/2020   Lumbar spondylosis 12/16/2019   Tussive syncope 12/10/2019   Encounter for general adult medical examination without abnormal findings 10/17/2019   Impingement syndrome of left shoulder region 08/02/2019   Low back pain 08/02/2019   Class 1 obesity 04/06/2019   Essential hypertension 04/06/2019   Encounter for orthopedic follow-up care 08/28/2018   Olecranon bursitis of left elbow 07/21/2018   Pain in joint of right shoulder 06/18/2018   Closed fracture of greater tuberosity of humerus 02/13/2018   Closed fracture of right scapula 02/13/2018   Pain in left knee 01/21/2018   Tobacco abuse 08/16/2014   Articular gout 06/27/2014   Coronary arteriosclerosis 06/17/2014   Electrocardiogram abnormal 01/25/2014   Hyperlipidemia with target LDL less than 100 12/28/2012   Generalized anxiety disorder 12/28/2012       Review of Systems  Constitutional:  Negative for diaphoresis.  Eyes:  Negative for pain.  Respiratory:  Negative for shortness of breath.   Cardiovascular:  Negative for chest pain,  palpitations and leg swelling.  Gastrointestinal:  Negative for abdominal pain.  Endocrine: Negative for polydipsia.  Skin:  Negative for rash.  Neurological:  Negative for dizziness, weakness and headaches.  Hematological:  Does not bruise/bleed easily.  All other systems reviewed and are negative.      Objective:   Physical Exam Vitals reviewed.  Constitutional:      Appearance: Normal appearance.  Cardiovascular:     Rate and Rhythm: Normal rate and regular rhythm.  Pulmonary:     Breath sounds: Normal breath sounds.  Skin:    General: Skin is warm.     Comments: 3cm raised sebaceous cyst right upper back  Neurological:     General: No focal deficit present.     Mental Status: He is alert and oriented to person, place, and time.  Psychiatric:        Mood and Affect: Mood normal.        Behavior: Behavior normal.    BP 137/82   Pulse 69   Temp 97.7 F (36.5 C) (Temporal)   Resp 20   Ht 5\' 8"  (1.727 m)   Wt 181 lb (82.1 kg)   SpO2 97%   BMI 27.52 kg/m   I & D  Date/Time: 02/28/2023 4:25 PM  Performed by: Bennie Pierini, FNP Authorized by: Bennie Pierini, FNP   Consent:    Consent obtained:  Verbal   Consent given by:  Patient   Risks, benefits, and alternatives were discussed: yes     Risks discussed:  Infection Location:  Type:  Cyst   Size:  3cm   Location:  Trunk   Trunk location:  Back Pre-procedure details:    Skin preparation:  Povidone-iodine Sedation:    Sedation type:  None Anesthesia:    Anesthesia method:  Local infiltration   Local anesthetic:  Lidocaine 1% w/o epi Procedure details:    Needle aspiration: no     Incision types:  Single straight   Incision depth:  Subcutaneous   Scalpel blade:  15   Wound management:  Probed and deloculated and irrigated with saline   Drainage characteristics: cottage cheese appearing.   Wound treatment: closed with 2 simple interrupted stitches.   Packing materials:   None Post-procedure details:    Procedure completion:  Tolerated well, no immediate complications         Assessment & Plan:  Eric Saunders in today with chief complaint of Cyst on back   1. Sebaceous cyst Removal Patient tolerated well Watch for signs of infection Stitch removal in 10 days     The above assessment and management plan was discussed with the patient. The patient verbalized understanding of and has agreed to the management plan. Patient is aware to call the clinic if symptoms persist or worsen. Patient is aware when to return to the clinic for a follow-up visit. Patient educated on when it is appropriate to go to the emergency department.   Mary-Margaret Daphine Deutscher, FNP

## 2023-03-03 ENCOUNTER — Ambulatory Visit: Payer: Self-pay | Admitting: *Deleted

## 2023-03-03 NOTE — Patient Outreach (Signed)
  Care Coordination   03/03/2023 Name: Eric Saunders MRN: [id number] DOB: [birth year]   Care Coordination Outreach Attempts:  An unsuccessful telephone outreach was attempted today to offer the patient information about available care coordination services.  Follow Up Plan:  Additional outreach attempts will be made to offer the patient care coordination information and services.   Encounter Outcome:  No Answer   Care Coordination Interventions:  No, not indicated    Jalene Demo L. Noelle Penner, RN, BSN, CCM [facility] Care Management Community Coordinator Office number [phone]

## 2023-03-06 ENCOUNTER — Ambulatory Visit (INDEPENDENT_AMBULATORY_CARE_PROVIDER_SITE_OTHER): Payer: BC Managed Care – PPO | Admitting: Nurse Practitioner

## 2023-03-06 ENCOUNTER — Encounter: Payer: Self-pay | Admitting: Nurse Practitioner

## 2023-03-06 VITALS — BP 125/76 | HR 66 | Temp 98.0°F | Resp 20 | Ht 68.0 in | Wt 181.0 lb

## 2023-03-06 DIAGNOSIS — L723 Sebaceous cyst: Secondary | ICD-10-CM

## 2023-03-06 MED ORDER — CEPHALEXIN 500 MG PO CAPS: 500.0000 mg | ORAL_CAPSULE | Freq: Three times a day (TID) | ORAL | 0 refills | Status: DC

## 2023-03-06 NOTE — Progress Notes (Signed)
Subjective:    Patient ID: Eric Saunders, male    DOB: [birth year], 67 y.o.   MRN: [id number]   Chief Complaint: Wound Check   Wound Check   Patient had a sebaceous cyst removed on 02/28/23. He thinks it may be infected and wants it looked at. Says area is erythematous but no pain or drainage.  Patient Active Problem List   Diagnosis Date Noted   TIA (transient ischemic attack) 08/25/2022   Combined forms of age-related cataract of right eye 10/16/2021   Hydrocele 07/25/2021   Degeneration of lumbar intervertebral disc 04/10/2021   Subacromial bursitis of left shoulder joint 11/14/2020   Pain of left hand 11/13/2020   Globus sensation 01/19/2020   Gastroesophageal reflux disease 01/19/2020   Tinnitus of both ears 01/19/2020   Contusion of left elbow 01/13/2020   Closed fracture of olecranon process of left ulna 01/03/2020   Chronic obstructive lung disease (HCC) 01/03/2020   Lumbar spondylosis 12/16/2019   Tussive syncope 12/10/2019   Encounter for general adult medical examination without abnormal findings 10/17/2019   Impingement syndrome of left shoulder region 08/02/2019   Low back pain 08/02/2019   Class 1 obesity 04/06/2019   Essential hypertension 04/06/2019   Encounter for orthopedic follow-up care 08/28/2018   Olecranon bursitis of left elbow 07/21/2018   Pain in joint of right shoulder 06/18/2018   Closed fracture of greater tuberosity of humerus 02/13/2018   Closed fracture of right scapula 02/13/2018   Pain in left knee 01/21/2018   Tobacco abuse 08/16/2014   Articular gout 06/27/2014   Coronary arteriosclerosis 06/17/2014   Electrocardiogram abnormal 01/25/2014   Hyperlipidemia with target LDL less than 100 12/28/2012   Generalized anxiety disorder 12/28/2012       Review of Systems  Constitutional:  Negative for diaphoresis.  Eyes:  Negative for pain.  Respiratory:  Negative for shortness of breath.   Cardiovascular:  Negative for chest pain,  palpitations and leg swelling.  Gastrointestinal:  Negative for abdominal pain.  Endocrine: Negative for polydipsia.  Skin:  Negative for rash.  Neurological:  Negative for dizziness, weakness and headaches.  Hematological:  Does not bruise/bleed easily.  All other systems reviewed and are negative.      Objective:   Physical Exam Vitals and nursing note reviewed.  Constitutional:      Appearance: Normal appearance. He is well-developed.  Neck:     Thyroid: No thyroid mass or thyromegaly.     Vascular: No carotid bruit or JVD.     Trachea: Phonation normal.  Cardiovascular:     Rate and Rhythm: Normal rate and regular rhythm.  Pulmonary:     Effort: Pulmonary effort is normal. No respiratory distress.     Breath sounds: Normal breath sounds.  Abdominal:     Tenderness: There is no abdominal tenderness.  Musculoskeletal:        General: Normal range of motion.     Cervical back: Normal range of motion and neck supple.  Lymphadenopathy:     Cervical: No cervical adenopathy.  Skin:    General: Skin is warm and dry.     Comments: Surgical area erythematous with some cottage cheese like darinage, Area numbed with lidociane plain 2 cc. Cleaned and packed.  Neurological:     Mental Status: He is alert and oriented to person, place, and time.  Psychiatric:        Behavior: Behavior normal.        Thought Content: Thought content normal.  Judgment: Judgment normal.     BP 125/76   Pulse 66   Temp 98 F (36.7 C) (Temporal)   Resp 20   Ht 5\' 8"  (1.727 m)   Wt 181 lb (82.1 kg)   BMI 27.52 kg/m        Assessment & Plan:  Eric Saunders in today with chief complaint of Wound Check   1. Sebaceous cyst Keep clean and dry Do not mess with packing until follow up Meds ordered this encounter  Medications   cephALEXin (KEFLEX) 500 MG capsule    Sig: Take 1 capsule (500 mg total) by mouth 3 (three) times daily.    Dispense:  14 capsule    Refill:  0    Order  Specific Question:   Supervising Provider    Answer:   Arville Care A [[id number]]       The above assessment and management plan was discussed with the patient. The patient verbalized understanding of and has agreed to the management plan. Patient is aware to call the clinic if symptoms persist or worsen. Patient is aware when to return to the clinic for a follow-up visit. Patient educated on when it is appropriate to go to the emergency department.   Mary-Margaret Daphine Deutscher, FNP

## 2023-03-10 ENCOUNTER — Ambulatory Visit (INDEPENDENT_AMBULATORY_CARE_PROVIDER_SITE_OTHER): Payer: BC Managed Care – PPO | Admitting: Nurse Practitioner

## 2023-03-10 VITALS — BP 140/80 | HR 62 | Temp 98.0°F | Resp 20 | Ht 68.0 in | Wt 181.0 lb

## 2023-03-10 DIAGNOSIS — Z4800 Encounter for change or removal of nonsurgical wound dressing: Secondary | ICD-10-CM

## 2023-03-10 NOTE — Progress Notes (Signed)
Subjective:    Patient ID: Eric Saunders, male    DOB: [birth year], 67 y.o.   MRN: [id number]   Chief Complaint: Wound Check   Wound Check   Patient in for packing change. Had sebaceus cyst removed over a week ago. Packed with 1/4 inch packing. Patient Active Problem List   Diagnosis Date Noted   TIA (transient ischemic attack) 08/25/2022   Combined forms of age-related cataract of right eye 10/16/2021   Hydrocele 07/25/2021   Degeneration of lumbar intervertebral disc 04/10/2021   Subacromial bursitis of left shoulder joint 11/14/2020   Pain of left hand 11/13/2020   Globus sensation 01/19/2020   Gastroesophageal reflux disease 01/19/2020   Tinnitus of both ears 01/19/2020   Contusion of left elbow 01/13/2020   Closed fracture of olecranon process of left ulna 01/03/2020   Chronic obstructive lung disease (HCC) 01/03/2020   Lumbar spondylosis 12/16/2019   Tussive syncope 12/10/2019   Encounter for general adult medical examination without abnormal findings 10/17/2019   Impingement syndrome of left shoulder region 08/02/2019   Low back pain 08/02/2019   Class 1 obesity 04/06/2019   Essential hypertension 04/06/2019   Encounter for orthopedic follow-up care 08/28/2018   Olecranon bursitis of left elbow 07/21/2018   Pain in joint of right shoulder 06/18/2018   Closed fracture of greater tuberosity of humerus 02/13/2018   Closed fracture of right scapula 02/13/2018   Pain in left knee 01/21/2018   Tobacco abuse 08/16/2014   Articular gout 06/27/2014   Coronary arteriosclerosis 06/17/2014   Electrocardiogram abnormal 01/25/2014   Hyperlipidemia with target LDL less than 100 12/28/2012   Generalized anxiety disorder 12/28/2012       Review of Systems  Constitutional:  Negative for diaphoresis.  Eyes:  Negative for pain.  Respiratory:  Negative for shortness of breath.   Cardiovascular:  Negative for chest pain, palpitations and leg swelling.  Gastrointestinal:   Negative for abdominal pain.  Endocrine: Negative for polydipsia.  Skin:  Negative for rash.  Neurological:  Negative for dizziness, weakness and headaches.  Hematological:  Does not bruise/bleed easily.  All other systems reviewed and are negative.      Objective:   Physical Exam Cardiovascular:     Rate and Rhythm: Normal rate and regular rhythm.     Heart sounds: Normal heart sounds.  Pulmonary:     Effort: Pulmonary effort is normal.     Breath sounds: Normal breath sounds.  Skin:    General: Skin is warm.     Comments: Wound to right upper back haling nicely. No erythema and no drainage.  Neurological:     General: No focal deficit present.     Mental Status: He is oriented to person, place, and time.  Psychiatric:        Mood and Affect: Mood normal.        Behavior: Behavior normal.    BP (!) 140/80   Pulse 62   Temp 98 F (36.7 C) (Temporal)   Resp 20   Ht 5\' 8"  (1.727 m)   Wt 181 lb (82.1 kg)   BMI 27.52 kg/m         Assessment & Plan:   Eric Saunders in today with chief complaint of Wound Check   1. Abscess packing removal Packing change Packing care discussed Follow up in 2 weeks    The above assessment and management plan was discussed with the patient. The patient verbalized understanding of and has agreed to  the management plan. Patient is aware to call the clinic if symptoms persist or worsen. Patient is aware when to return to the clinic for a follow-up visit. Patient educated on when it is appropriate to go to the emergency department.   Mary-Margaret Daphine Deutscher, FNP

## 2023-03-31 ENCOUNTER — Encounter: Payer: Self-pay | Admitting: *Deleted

## 2023-04-09 ENCOUNTER — Ambulatory Visit: Payer: Self-pay | Admitting: *Deleted

## 2023-04-09 NOTE — Patient Outreach (Signed)
  Care Coordination   Follow Up Visit Note   04/10/2023 Name: Eric Saunders MRN: [id number] DOB: 04-21-1956  Eric Saunders is a 67 y.o. year old male who sees Eric Pierini, FNP for primary care. I spoke with  Eric Saunders by phone today.  What matters to the patients health and wellness today?  Back Abscess/cyst now healed; No longer packing it  He confirms an unexpected [place] to possible increased pain of his back   Goals Addressed             This Visit's Progress    [facility] Care Coordination Services   On track    Care Coordination Interventions: Interventions Today    Flowsheet Row Most Recent Value  Chronic Disease   Chronic disease during today's visit Other  [back wound/cyst, back pain]  General Interventions   General Interventions Discussed/Reviewed General Interventions Reviewed  Doctor Visits Discussed/Reviewed Doctor Visits Reviewed, PCP  PCP/Specialist Visits Compliance with follow-up visit  Exercise Interventions   Exercise Discussed/Reviewed Exercise Reviewed, Physical Activity  Physical Activity Discussed/Reviewed Physical Activity Reviewed, Types of exercise  Education Interventions   Education Provided Provided Web-based Education  [Web based education on degenerative disc disease, managing chronic back pain, musculoskeletal pain, joint pain]  Provided Verbal Education On Other, When to see the doctor, Mental Health/Coping with Illness  Mental Health Interventions   Mental Health Discussed/Reviewed Mental Health Reviewed, Coping Strategies  Pharmacy Interventions   Pharmacy Dicussed/Reviewed Pharmacy Topics Reviewed, Affording Medications  Safety Interventions   Safety Discussed/Reviewed Safety Reviewed, Fall Risk      Interventions Today    Flowsheet Row Most Recent Value  Chronic Disease   Chronic disease during today's visit Other  [back wound/cyst, back pain]  General Interventions    General Interventions Discussed/Reviewed General Interventions Reviewed  Doctor Visits Discussed/Reviewed Doctor Visits Reviewed, PCP  PCP/Specialist Visits Compliance with follow-up visit  Exercise Interventions   Exercise Discussed/Reviewed Exercise Reviewed, Physical Activity  Physical Activity Discussed/Reviewed Physical Activity Reviewed, Types of exercise  Education Interventions   Education Provided Provided Web-based Education  [Web based education on degenerative disc disease, managing chronic back pain, musculoskeletal pain, joint pain]  Provided Verbal Education On Other, When to see the doctor, Mental Health/Coping with Illness  Mental Health Interventions   Mental Health Discussed/Reviewed Mental Health Reviewed, Coping Strategies  Pharmacy Interventions   Pharmacy Dicussed/Reviewed Pharmacy Topics Reviewed, Affording Medications  Safety Interventions   Safety Discussed/Reviewed Safety Reviewed, Fall Risk             SDOH assessments and interventions completed:  No     Care Coordination Interventions:  Yes, provided   Follow up plan: Follow up call scheduled for 07/10/23    Encounter Outcome:  Pt. Visit Completed   Zamani Crocker L. Noelle Penner, RN, BSN, CCM [facility] Care Management Community Coordinator Office number [phone]

## 2023-04-09 NOTE — Patient Outreach (Signed)
  Care Coordination   04/09/2023 Name: Eric Saunders MRN: [id number] DOB: [birth year]   Care Coordination Outreach Attempts:  An unsuccessful telephone outreach was attempted today to offer the patient information about available care coordination services.  Follow Up Plan:  Additional outreach attempts will be made to offer the patient care coordination information and services.   Encounter Outcome:  No Answer   Care Coordination Interventions:  No, not indicated    Ellee Wawrzyniak L. Noelle Penner, RN, BSN, CCM [facility] Care Management Community Coordinator Office number [phone]

## 2023-04-10 NOTE — Patient Instructions (Addendum)
Visit Information  Thank you for taking time to visit with me today. Please don't hesitate to contact me if I can be of assistance to you.   Following are the goals we discussed today:   Goals Addressed             This Visit's Progress    [facility] Care Coordination Services   On track    Care Coordination Interventions: Interventions Today    Flowsheet Row Most Recent Value  Chronic Disease   Chronic disease during today's visit Other  [back wound/cyst, back pain]  General Interventions   General Interventions Discussed/Reviewed General Interventions Reviewed  Doctor Visits Discussed/Reviewed Doctor Visits Reviewed, PCP  PCP/Specialist Visits Compliance with follow-up visit  Exercise Interventions   Exercise Discussed/Reviewed Exercise Reviewed, Physical Activity  Physical Activity Discussed/Reviewed Physical Activity Reviewed, Types of exercise  Education Interventions   Education Provided Provided Web-based Education  [Web based education on degenerative disc disease, managing chronic back pain, musculoskeletal pain, joint pain]  Provided Verbal Education On Other, When to see the doctor, Mental Health/Coping with Illness  Mental Health Interventions   Mental Health Discussed/Reviewed Mental Health Reviewed, Coping Strategies  Pharmacy Interventions   Pharmacy Dicussed/Reviewed Pharmacy Topics Reviewed, Affording Medications  Safety Interventions   Safety Discussed/Reviewed Safety Reviewed, Fall Risk      Interventions Today    Flowsheet Row Most Recent Value  Chronic Disease   Chronic disease during today's visit Other  [back wound/cyst, back pain]  General Interventions   General Interventions Discussed/Reviewed General Interventions Reviewed  Doctor Visits Discussed/Reviewed Doctor Visits Reviewed, PCP  PCP/Specialist Visits Compliance with follow-up visit  Exercise Interventions   Exercise Discussed/Reviewed Exercise Reviewed, Physical Activity  Physical Activity  Discussed/Reviewed Physical Activity Reviewed, Types of exercise  Education Interventions   Education Provided Provided Web-based Education  [Web based education on degenerative disc disease, managing chronic back pain, musculoskeletal pain, joint pain]  Provided Verbal Education On Other, When to see the doctor, Mental Health/Coping with Illness  Mental Health Interventions   Mental Health Discussed/Reviewed Mental Health Reviewed, Coping Strategies  Pharmacy Interventions   Pharmacy Dicussed/Reviewed Pharmacy Topics Reviewed, Affording Medications  Safety Interventions   Safety Discussed/Reviewed Safety Reviewed, Fall Risk              Our next appointment is by telephone on 07/10/23 at 1130  Please call the care guide team at [phone] if you need to cancel or reschedule your appointment.   If you are experiencing a Mental Health or Behavioral Health Crisis or need someone to talk to, please call the Suicide and Crisis Lifeline: 988 call the [place] National Suicide Prevention Lifeline: 878 055 3676 or TTY: 701-641-3313 TTY 918 322 7420) to talk to a trained counselor call 1-800-273-TALK (toll free, 24 hour hotline) call the [facility]: 708 622 4223 call 911   Patient verbalizes understanding of instructions and care plan provided today and agrees to view in MyChart. Active MyChart status and patient understanding of how to access instructions and care plan via MyChart confirmed with patient.     The patient has been provided with contact information for the care management team and has been advised to call with any health related questions or concerns.   Biridiana Twardowski L. Noelle Penner, RN, BSN, CCM [facility] Care Management Community Coordinator Office number [phone]

## 2023-05-26 ENCOUNTER — Other Ambulatory Visit: Payer: Self-pay | Admitting: Family

## 2023-07-07 ENCOUNTER — Encounter: Payer: Self-pay | Admitting: Nurse Practitioner

## 2023-07-07 ENCOUNTER — Ambulatory Visit (INDEPENDENT_AMBULATORY_CARE_PROVIDER_SITE_OTHER): Payer: Medicare HMO | Admitting: Nurse Practitioner

## 2023-07-07 VITALS — BP 137/78 | HR 63 | Temp 97.8°F | Resp 20 | Ht 68.0 in | Wt 174.0 lb

## 2023-07-07 DIAGNOSIS — J410 Simple chronic bronchitis: Secondary | ICD-10-CM | POA: Diagnosis not present

## 2023-07-07 DIAGNOSIS — E785 Hyperlipidemia, unspecified: Secondary | ICD-10-CM

## 2023-07-07 DIAGNOSIS — I10 Essential (primary) hypertension: Secondary | ICD-10-CM

## 2023-07-07 DIAGNOSIS — I2510 Atherosclerotic heart disease of native coronary artery without angina pectoris: Secondary | ICD-10-CM

## 2023-07-07 DIAGNOSIS — G459 Transient cerebral ischemic attack, unspecified: Secondary | ICD-10-CM | POA: Diagnosis not present

## 2023-07-07 DIAGNOSIS — K219 Gastro-esophageal reflux disease without esophagitis: Secondary | ICD-10-CM | POA: Diagnosis not present

## 2023-07-07 DIAGNOSIS — Z720 Tobacco use: Secondary | ICD-10-CM | POA: Diagnosis not present

## 2023-07-07 DIAGNOSIS — F411 Generalized anxiety disorder: Secondary | ICD-10-CM | POA: Diagnosis not present

## 2023-07-07 DIAGNOSIS — E66811 Obesity, class 1: Secondary | ICD-10-CM

## 2023-07-07 MED ORDER — OMEPRAZOLE 40 MG PO CPDR
40.0000 mg | DELAYED_RELEASE_CAPSULE | Freq: Two times a day (BID) | ORAL | 1 refills | Status: DC
Start: 2023-07-07 — End: 2023-12-31

## 2023-07-07 MED ORDER — METOPROLOL SUCCINATE ER 25 MG PO TB24
25.0000 mg | ORAL_TABLET | Freq: Every day | ORAL | 1 refills | Status: DC
Start: 2023-07-07 — End: 2023-12-31

## 2023-07-07 MED ORDER — ROSUVASTATIN CALCIUM 40 MG PO TABS
40.0000 mg | ORAL_TABLET | Freq: Every day | ORAL | 1 refills | Status: DC
Start: 2023-07-07 — End: 2023-12-31

## 2023-07-07 MED ORDER — ESCITALOPRAM OXALATE 20 MG PO TABS
40.0000 mg | ORAL_TABLET | Freq: Every day | ORAL | 1 refills | Status: DC
Start: 2023-07-07 — End: 2024-01-06

## 2023-07-07 MED ORDER — CLONIDINE HCL 0.1 MG PO TABS
0.1000 mg | ORAL_TABLET | Freq: Two times a day (BID) | ORAL | 1 refills | Status: DC
Start: 2023-07-07 — End: 2023-12-31

## 2023-07-07 MED ORDER — IBUPROFEN 800 MG PO TABS: 800.0000 mg | ORAL_TABLET | Freq: Three times a day (TID) | ORAL | 3 refills | Status: DC

## 2023-07-07 MED ORDER — AMLODIPINE BESYLATE 10 MG PO TABS
10.0000 mg | ORAL_TABLET | Freq: Every day | ORAL | 1 refills | Status: DC
Start: 2023-07-07 — End: 2024-01-06

## 2023-07-07 MED ORDER — ALPRAZOLAM 0.5 MG PO TABS: 0.5000 mg | ORAL_TABLET | Freq: Two times a day (BID) | ORAL | 2 refills | Status: DC | PRN

## 2023-07-07 MED ORDER — LOSARTAN POTASSIUM 100 MG PO TABS: 100.0000 mg | ORAL_TABLET | Freq: Every day | ORAL | 1 refills | Status: DC

## 2023-07-07 MED ORDER — FENOFIBRATE 160 MG PO TABS
160.0000 mg | ORAL_TABLET | Freq: Every day | ORAL | 1 refills | Status: DC
Start: 2023-07-07 — End: 2023-12-31

## 2023-07-07 NOTE — Addendum Note (Signed)
Addended by: Bennie Pierini on: 07/07/2023 11:22 AM   Modules accepted: Orders

## 2023-07-07 NOTE — Patient Instructions (Signed)

## 2023-07-07 NOTE — Progress Notes (Signed)
Subjective:    Patient ID: Eric Saunders, male    DOB: December 25, 1955, 67 y.o.   MRN: [id number]   Chief Complaint: medical management of chronic issues     HPI:  Eric Saunders is a 67 y.o. who identifies as a male who was assigned male at birth.   Social history: Lives with: wife Work history: disability   Comes in today for follow up of the following chronic medical issues:  1. Essential hypertension No c/o chest pain, sob or headache. Does not check blood pressure at home. BP Readings from Last 3 Encounters:  03/10/23 (!) 140/80  03/06/23 125/76  02/28/23 137/82     2. Coronary arteriosclerosis Last saw cardiology on 11/27/22. Review of office note showed no change I plan of care.  3. TIA (transient ischemic attack) No permanent effects  4. Simple chronic bronchitis (HCC) Has chronic cough. Is currently on no inhalers  5. Gastroesophageal reflux disease without esophagitis Is on daily dose of omeprazole and is doing well.  6. Hyperlipidemia with target LDL less than 100 Does try to watch diet and stays active but does no dedicated exercise. Lab Results  Component Value Date   CHOL 134 08/25/2022   HDL 34 (L) 08/25/2022   LDLCALC 74 08/25/2022   TRIG 132 08/25/2022   CHOLHDL 3.9 08/25/2022     7. Generalized anxiety disorder Is on combination of lexapro and xanax. Is doing well currently    07/07/2023   11:03 AM 03/10/2023   10:09 AM 03/06/2023    9:51 AM 02/28/2023    4:03 PM  GAD 7 : Generalized Anxiety Score  Nervous, Anxious, on Edge 1 0 1 0  Control/stop worrying 0 0 1 0  Worry too much - different things 0 0 1 0  Trouble relaxing 0 0 1 0  Restless 1 0 1 0  Easily annoyed or irritable 1 0 1 0  Afraid - awful might happen 0 0 0 0  Total GAD 7 Score 3 0 6 0  Anxiety Difficulty Not difficult at all Not difficult at all Not difficult at all Not difficult at all      8. Tobacco abuse Still smokes over a pack a day.  9. Class 1 obesity No  recent weight changes Wt Readings from Last 3 Encounters:  07/07/23 174 lb (78.9 kg)  03/10/23 181 lb (82.1 kg)  03/06/23 181 lb (82.1 kg)   BMI Readings from Last 3 Encounters:  07/07/23 26.46 kg/m  03/10/23 27.52 kg/m  03/06/23 27.52 kg/m      New complaints: None today   Allergies  Allergen Reactions   Ace Inhibitors Cough   Outpatient Encounter Medications as of 07/07/2023  Medication Sig   ALPRAZolam (XANAX) 0.5 MG tablet Take 1 tablet (0.5 mg total) by mouth 2 (two) times daily as needed for anxiety.   amLODipine (NORVASC) 10 MG tablet Take 1 tablet (10 mg total) by mouth daily.   aspirin 81 MG tablet Take 81 mg by mouth daily.   benzonatate (TESSALON) 100 MG capsule TAKE 1 CAPSULE BY MOUTH THREE TIMES A DAY AS NEEDED FOR COUGH (Patient taking differently: Take 100 mg by mouth 3 (three) times daily as needed for cough.)   cephALEXin (KEFLEX) 500 MG capsule Take 1 capsule (500 mg total) by mouth 3 (three) times daily.   cloNIDine (CATAPRES) 0.1 MG tablet Take 1 tablet (0.1 mg total) by mouth 2 (two) times daily.   cyclobenzaprine (FLEXERIL) 10 MG tablet  TAKE 1 TABLET BY MOUTH TWICE A DAY AS NEEDED   escitalopram (LEXAPRO) 20 MG tablet Take 2 tablets (40 mg total) by mouth daily.   fenofibrate 160 MG tablet Take 1 tablet (160 mg total) by mouth daily.   ibuprofen (ADVIL) 200 MG tablet Take 800 mg by mouth every 8 (eight) hours as needed for moderate pain (for knee or back pain).   losartan (COZAAR) 100 MG tablet Take 1 tablet (100 mg total) by mouth daily.   metoprolol succinate (TOPROL-XL) 25 MG 24 hr tablet Take 1 tablet (25 mg total) by mouth daily.   omeprazole (PRILOSEC) 40 MG capsule Take 1 capsule (40 mg total) by mouth 2 (two) times daily.   rosuvastatin (CRESTOR) 40 MG tablet Take 1 tablet (40 mg total) by mouth daily.   sildenafil (VIAGRA) 100 MG tablet Take 100 mg by mouth as needed for erectile dysfunction.   triamcinolone cream (KENALOG) 0.1 % Apply 1  application topically 2 (two) times daily. (Patient taking differently: Apply 1 application  topically daily as needed (for itching on arms).)   No facility-administered encounter medications on file as of 07/07/2023.    Past Surgical History:  Procedure Laterality Date   COLONOSCOPY     CORONARY ARTERY BYPASS GRAFT N/A 06/17/2014   Procedure: CORONARY ARTERY BYPASS GRAFTING (CABG) X 4, RIGHT LEG EVH;  Surgeon: Alleen Borne, MD;  Location: [facility] OR;  Service: Open Heart Surgery;  Laterality: N/A;   KNEE SURGERY     bilateral arthroscopic   LEFT HEART CATHETERIZATION WITH CORONARY ANGIOGRAM N/A 06/16/2014   Procedure: LEFT HEART CATHETERIZATION WITH CORONARY ANGIOGRAM;  Surgeon: Kathleene Hazel, MD;  Location: [facility] CATH LAB;  Service: Cardiovascular;  Laterality: N/A;   TEE WITHOUT CARDIOVERSION N/A 06/17/2014   Procedure: TRANSESOPHAGEAL ECHOCARDIOGRAM (TEE);  Surgeon: Alleen Borne, MD;  Location: [facility] OR;  Service: Open Heart Surgery;  Laterality: N/A;   UPPER GASTROINTESTINAL ENDOSCOPY      Family History  Problem Relation Age of Onset   Drug abuse Sister    Diabetes Sister    Healthy Sister    Healthy Sister    Healthy Sister    Healthy Sister    Healthy Brother    Healthy Brother    Colon cancer Neg Hx    Esophageal cancer Neg Hx    Inflammatory bowel disease Neg Hx    Liver disease Neg Hx    Pancreatic cancer Neg Hx    Rectal cancer Neg Hx    Stomach cancer Neg Hx    Stroke Neg Hx       Controlled substance contract: n/a     Review of Systems  Constitutional:  Negative for diaphoresis.  Eyes:  Negative for pain.  Respiratory:  Negative for shortness of breath.   Cardiovascular:  Negative for chest pain, palpitations and leg swelling.  Gastrointestinal:  Negative for abdominal pain.  Endocrine: Negative for polydipsia.  Skin:  Negative for rash.  Neurological:  Negative for dizziness, weakness and headaches.  Hematological:  Does not bruise/bleed easily.   All other systems reviewed and are negative.      Objective:   Physical Exam Vitals and nursing note reviewed.  Constitutional:      Appearance: Normal appearance. He is well-developed.  HENT:     Head: Normocephalic.     Nose: Nose normal.     Mouth/Throat:     Mouth: Mucous membranes are moist.     Pharynx: Oropharynx is clear.  Eyes:  Pupils: Pupils are equal, round, and reactive to light.  Neck:     Thyroid: No thyroid mass or thyromegaly.     Vascular: No carotid bruit or JVD.     Trachea: Phonation normal.  Cardiovascular:     Rate and Rhythm: Normal rate and regular rhythm.  Pulmonary:     Effort: Pulmonary effort is normal. No respiratory distress.     Breath sounds: Normal breath sounds.  Abdominal:     General: Bowel sounds are normal.     Palpations: Abdomen is soft.     Tenderness: There is no abdominal tenderness.  Musculoskeletal:        General: Normal range of motion.     Cervical back: Normal range of motion and neck supple.  Lymphadenopathy:     Cervical: No cervical adenopathy.  Skin:    General: Skin is warm and dry.  Neurological:     Mental Status: He is alert and oriented to person, place, and time.  Psychiatric:        Behavior: Behavior normal.        Thought Content: Thought content normal.        Judgment: Judgment normal.    BP 137/78   Pulse 63   Temp 97.8 F (36.6 C) (Temporal)   Resp 20   Ht 5\' 8"  (1.727 m)   Wt 174 lb (78.9 kg)   SpO2 98%   BMI 26.46 kg/m         Assessment & Plan:  Eric Saunders comes in today with chief complaint of Medical Management of Chronic Issues   Diagnosis and orders addressed:  1. Essential hypertension Low sodium diet - amLODipine (NORVASC) 10 MG tablet; Take 1 tablet (10 mg total) by mouth daily.  Dispense: 90 tablet; Refill: 1 - cloNIDine (CATAPRES) 0.1 MG tablet; Take 1 tablet (0.1 mg total) by mouth 2 (two) times daily.  Dispense: 180 tablet; Refill: 1 - losartan (COZAAR) 100  MG tablet; Take 1 tablet (100 mg total) by mouth daily.  Dispense: 90 tablet; Refill: 1 - CBC with Differential/Platelet - CMP14+EGFR  2. Coronary arteriosclerosis Keep follow up with cardiology - metoprolol succinate (TOPROL-XL) 25 MG 24 hr tablet; Take 1 tablet (25 mg total) by mouth daily.  Dispense: 90 tablet; Refill: 1  3. TIA (transient ischemic attack)  4. Simple chronic bronchitis (HCC)  5. Gastroesophageal reflux disease without esophagitis Avoid spicy foods Do not eat 2 hours prior to bedtime - omeprazole (PRILOSEC) 40 MG capsule; Take 1 capsule (40 mg total) by mouth 2 (two) times daily.  Dispense: 180 capsule; Refill: 1  6. Hyperlipidemia with target LDL less than 100 Low fat diet - fenofibrate 160 MG tablet; Take 1 tablet (160 mg total) by mouth daily.  Dispense: 90 tablet; Refill: 1 - rosuvastatin (CRESTOR) 40 MG tablet; Take 1 tablet (40 mg total) by mouth daily.  Dispense: 90 tablet; Refill: 1 - Lipid panel  7. Generalized anxiety disorder Stress management - ALPRAZolam (XANAX) 0.5 MG tablet; Take 1 tablet (0.5 mg total) by mouth 2 (two) times daily as needed for anxiety.  Dispense: 60 tablet; Refill: 2 - escitalopram (LEXAPRO) 20 MG tablet; Take 2 tablets (40 mg total) by mouth daily.  Dispense: 180 tablet; Refill: 1  8. Tobacco abuse Smoking cessation encouraged - Ambulatory Referral for Lung Cancer Scre  9. Class 1 obesity Discussed diet and exercise for person with BMI >25 Will recheck weight in 3-6 months    Labs pending Health  Maintenance reviewed Diet and exercise encouraged  Follow up plan: 6 months   Mary-Margaret Daphine Deutscher, FNP

## 2023-07-08 ENCOUNTER — Other Ambulatory Visit: Payer: Self-pay | Admitting: Nurse Practitioner

## 2023-07-08 LAB — CMP14+EGFR
ALT: 9 [IU]/L (ref 0–44)
AST: 13 [IU]/L (ref 0–40)
Albumin: 4.2 g/dL (ref 3.9–4.9)
Alkaline Phosphatase: 46 [IU]/L (ref 44–121)
BUN/Creatinine Ratio: 18 (ref 10–24)
BUN: 18 mg/dL (ref 8–27)
Bilirubin Total: 0.3 mg/dL (ref 0.0–1.2)
CO2: 24 mmol/L (ref 20–29)
Calcium: 9.5 mg/dL (ref 8.6–10.2)
Chloride: 101 mmol/L (ref 96–106)
Creatinine, Ser: 1 mg/dL (ref 0.76–1.27)
Globulin, Total: 2.4 g/dL (ref 1.5–4.5)
Glucose: 85 mg/dL (ref 70–99)
Potassium: 4.4 mmol/L (ref 3.5–5.2)
Sodium: 140 mmol/L (ref 134–144)
Total Protein: 6.6 g/dL (ref 6.0–8.5)
eGFR: 84 mL/min/{1.73_m2} (ref >59)

## 2023-07-08 LAB — CBC WITH DIFFERENTIAL/PLATELET
Basophils Absolute: 0 10*3/uL (ref 0.0–0.2)
Basos: 1 %
EOS (ABSOLUTE): 0.1 10*3/uL (ref 0.0–0.4)
Eos: 2 %
Hematocrit: 45.6 % (ref 37.5–51.0)
Hemoglobin: 15 g/dL (ref 13.0–17.7)
Immature Grans (Abs): 0 10*3/uL (ref 0.0–0.1)
Immature Granulocytes: 0 %
Lymphocytes Absolute: 1.9 10*3/uL (ref 0.7–3.1)
Lymphs: 32 %
MCH: 30.4 pg (ref 26.6–33.0)
MCHC: 32.9 g/dL (ref 31.5–35.7)
MCV: 92 fL (ref 79–97)
Monocytes Absolute: 0.4 10*3/uL (ref 0.1–0.9)
Monocytes: 7 %
Neutrophils Absolute: 3.5 10*3/uL (ref 1.4–7.0)
Neutrophils: 58 %
Platelets: 258 10*3/uL (ref 150–450)
RBC: 4.94 x10E6/uL (ref 4.14–5.80)
RDW: 13.8 % (ref 11.6–15.4)
WBC: 5.9 10*3/uL (ref 3.4–10.8)

## 2023-07-08 LAB — LIPID PANEL
Chol/HDL Ratio: 2.3 ratio (ref 0.0–5.0)
Cholesterol, Total: 126 mg/dL (ref 100–199)
HDL: 55 mg/dL (ref >39)
LDL Chol Calc (NIH): 58 mg/dL (ref 0–99)
Triglycerides: 62 mg/dL (ref 0–149)
VLDL Cholesterol Cal: 13 mg/dL (ref 5–40)

## 2023-07-10 ENCOUNTER — Ambulatory Visit: Payer: Self-pay | Admitting: *Deleted

## 2023-07-10 NOTE — Patient Outreach (Signed)
  Care Coordination   07/10/2023 Name: Eric Saunders MRN: [id number] DOB: Feb 10, 1956   Care Coordination Outreach Attempts:  An unsuccessful telephone outreach was attempted today to offer the patient information about available care coordination services.  Follow Up Plan:  Additional outreach attempts will be made to offer the patient care coordination information and services.   Encounter Outcome:  No Answer   Care Coordination Interventions:  No, not indicated    Teren Zurcher L. Noelle Penner, RN, BSN, [facility]  [facility] Care Management Coordinator  [phone]  Fax: [phone]

## 2023-07-14 DIAGNOSIS — J020 Streptococcal pharyngitis: Secondary | ICD-10-CM | POA: Diagnosis not present

## 2023-07-14 DIAGNOSIS — R051 Acute cough: Secondary | ICD-10-CM | POA: Diagnosis not present

## 2023-07-14 DIAGNOSIS — J189 Pneumonia, unspecified organism: Secondary | ICD-10-CM | POA: Diagnosis not present

## 2023-07-14 DIAGNOSIS — R5383 Other fatigue: Secondary | ICD-10-CM | POA: Diagnosis not present

## 2023-07-15 ENCOUNTER — Ambulatory Visit: Payer: Medicare HMO | Admitting: Nurse Practitioner

## 2023-07-18 ENCOUNTER — Telehealth: Payer: Self-pay | Admitting: Nurse Practitioner

## 2023-08-01 ENCOUNTER — Telehealth: Payer: Self-pay | Admitting: *Deleted

## 2023-08-01 NOTE — Progress Notes (Signed)
  Care Coordination Note  08/01/2023 Name: Eric Saunders MRN: [id number] DOB: 06-16-1956  Eric Saunders is a 67 y.o. year old male who is a primary care patient of Bennie Pierini, FNP and is actively engaged with the care management team. I reached out to Ellison Hughs by phone today to assist with reviewing appointment scheduled for 09/18/23  Follow up plan: Unsuccessful telephone outreach attempt made. A HIPAA compliant phone message was left for the patient providing contact information and requesting a return call.   [facility]  Care Coordination Care Guide  Direct Dial: [phone]

## 2023-08-01 NOTE — Progress Notes (Signed)
  Care Coordination Note  08/01/2023 Name: BOHDAN PIZZINI MRN: [id number] DOB: [birth year]  KHAREE HONOR is a 67 y.o. year old male who is a primary care patient of Bennie Pierini, FNP and is actively engaged with the care management team. I reached out to Ellison Hughs by phone today to assist with  reviewing   a follow up visit with the RN Case Manager  Follow up plan: Telephone appointment with care management team member scheduled for:09/18/23 \\Jose Corvin  [facility] Coordination Care Guide  Direct Dial: [phone]

## 2023-08-04 ENCOUNTER — Encounter: Payer: Self-pay | Admitting: Nurse Practitioner

## 2023-08-04 ENCOUNTER — Ambulatory Visit (INDEPENDENT_AMBULATORY_CARE_PROVIDER_SITE_OTHER): Payer: Medicare HMO | Admitting: Nurse Practitioner

## 2023-08-04 VITALS — BP 140/80 | HR 59 | Temp 98.5°F | Ht 68.0 in | Wt 178.0 lb

## 2023-08-04 DIAGNOSIS — Z0000 Encounter for general adult medical examination without abnormal findings: Secondary | ICD-10-CM

## 2023-08-04 NOTE — Progress Notes (Signed)
Subjective:    Eric Saunders is a 67 y.o. male who presents for a Welcome to Medicare exam.         Objective:    Today's Vitals   08/04/23 0758  BP: (!) 140/80  Pulse: (!) 59  Temp: 98.5 F (36.9 C)  SpO2: 98%  Weight: 178 lb (80.7 kg)  Height: 5\' 8"  (1.727 m)  PainSc: 0-No pain   Body mass index is 27.06 kg/m.  Medications Outpatient Encounter Medications as of 08/04/2023  Medication Sig   ALPRAZolam (XANAX) 0.5 MG tablet Take 1 tablet (0.5 mg total) by mouth 2 (two) times daily as needed for anxiety.   amLODipine (NORVASC) 10 MG tablet Take 1 tablet (10 mg total) by mouth daily.   aspirin 81 MG tablet Take 81 mg by mouth daily.   benzonatate (TESSALON) 100 MG capsule TAKE 1 CAPSULE BY MOUTH THREE TIMES A DAY AS NEEDED FOR COUGH (Patient taking differently: Take 100 mg by mouth 3 (three) times daily as needed for cough.)   cephALEXin (KEFLEX) 500 MG capsule Take 1 capsule (500 mg total) by mouth 3 (three) times daily.   cloNIDine (CATAPRES) 0.1 MG tablet Take 1 tablet (0.1 mg total) by mouth 2 (two) times daily.   cyclobenzaprine (FLEXERIL) 10 MG tablet TAKE 1 TABLET BY MOUTH TWICE A DAY AS NEEDED   escitalopram (LEXAPRO) 20 MG tablet Take 2 tablets (40 mg total) by mouth daily.   fenofibrate 160 MG tablet Take 1 tablet (160 mg total) by mouth daily.   ibuprofen (ADVIL) 800 MG tablet Take 1 tablet (800 mg total) by mouth 3 (three) times daily.   losartan (COZAAR) 100 MG tablet Take 1 tablet (100 mg total) by mouth daily.   metoprolol succinate (TOPROL-XL) 25 MG 24 hr tablet Take 1 tablet (25 mg total) by mouth daily.   omeprazole (PRILOSEC) 40 MG capsule Take 1 capsule (40 mg total) by mouth 2 (two) times daily.   rosuvastatin (CRESTOR) 40 MG tablet Take 1 tablet (40 mg total) by mouth daily.   sildenafil (VIAGRA) 100 MG tablet Take 100 mg by mouth as needed for erectile dysfunction.   triamcinolone cream (KENALOG) 0.1 % Apply 1 application topically 2 (two) times  daily. (Patient taking differently: Apply 1 application  topically daily as needed (for itching on arms).)   No facility-administered encounter medications on file as of 08/04/2023.     History: Past Medical History:  Diagnosis Date   Arthritis    Carotid artery occlusion    COPD (chronic obstructive pulmonary disease) (HCC)    Coronary artery disease    Depression    GERD (gastroesophageal reflux disease)    History of angina    Hyperlipemia    Hypertension    Panic disorder    Tobacco abuse    Past Surgical History:  Procedure Laterality Date   COLONOSCOPY     CORONARY ARTERY BYPASS GRAFT N/A 06/17/2014   Procedure: CORONARY ARTERY BYPASS GRAFTING (CABG) X 4, RIGHT LEG EVH;  Surgeon: Alleen Borne, MD;  Location: [facility] OR;  Service: Open Heart Surgery;  Laterality: N/A;   KNEE SURGERY     bilateral arthroscopic   LEFT HEART CATHETERIZATION WITH CORONARY ANGIOGRAM N/A 06/16/2014   Procedure: LEFT HEART CATHETERIZATION WITH CORONARY ANGIOGRAM;  Surgeon: Kathleene Hazel, MD;  Location: [facility] CATH LAB;  Service: Cardiovascular;  Laterality: N/A;   TEE WITHOUT CARDIOVERSION N/A 06/17/2014   Procedure: TRANSESOPHAGEAL ECHOCARDIOGRAM (TEE);  Surgeon: Alleen Borne,  MD;  Location: [facility] OR;  Service: Open Heart Surgery;  Laterality: N/A;   UPPER GASTROINTESTINAL ENDOSCOPY      Family History  Problem Relation Age of Onset   Drug abuse Sister    Diabetes Sister    Healthy Sister    Healthy Sister    Healthy Sister    Healthy Sister    Healthy Brother    Healthy Brother    Colon cancer Neg Hx    Esophageal cancer Neg Hx    Inflammatory bowel disease Neg Hx    Liver disease Neg Hx    Pancreatic cancer Neg Hx    Rectal cancer Neg Hx    Stomach cancer Neg Hx    Stroke Neg Hx    Social History   Occupational History   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  Also Admitted to Vaping.    Smoking Cessation Classes, Services & Resources Encouraged.  Vaping Use   Vaping status: Some Days   Substances: Nicotine  Substance and Sexual Activity   Alcohol use: Yes    Alcohol/week: 6.0 standard drinks of alcohol    Types: 6 Cans of beer per week    Comment: 6 weekly   Drug use: No   Sexual activity: Not Currently    Partners: Female    Tobacco Counseling Ready to quit: No Counseling given: yes Tobacco comments: Continues to Smoke at Least 1 Pack of Cigarettes Per Day.  Also Admitted to Vaping. Smoking Cessation Classes, Services & Resources Encouraged.   Immunizations and Health Maintenance Immunization History  Administered Date(s) Administered   Influenza-Unspecified 07/17/2018   Health Maintenance Due  Topic Date Due   Lung Cancer Screening  08/07/2023    Activities of Daily Living    10/31/2022    3:49 PM  In your present state of health, do you have any difficulty performing the following activities:  Hearing? 0  Vision? 0  Difficulty concentrating or making decisions? 0  Walking or climbing stairs? 0  Dressing or bathing? 0  Doing errands, shopping? 0  Preparing Food and eating ? N  Using the Toilet? N  In the past six months, have you accidently leaked urine? N  Do you have problems with loss of bowel control? N  Managing your Medications? N  Managing your Finances? N  Housekeeping or managing your Housekeeping? N    Physical Exam   Physical Exam (optional), or other factors deemed appropriate based on the beneficiary's medical and social history and current clinical standards.   Advanced Directives: Does Patient Have a Medical Advance Directive?: No Would patient like information on creating a medical advance directive?:  Yes (ED - Information included in AVS)   EKG:  normal EKG, normal sinus rhythm, unchanged from previous tracings     Assessment:    This is a routine wellness  examination for this patient . Welcome  to medicare  Vision/Hearing screen Patient will schedule   Goals      [facility] Care Coordination Services     Care Coordination Interventions: Interventions Today    Flowsheet Row  Most Recent Value  Chronic Disease   Chronic disease during today's visit Other  [back wound/cyst, back pain]  General Interventions   General Interventions Discussed/Reviewed General Interventions Reviewed  Doctor Visits Discussed/Reviewed Doctor Visits Reviewed, PCP  PCP/Specialist Visits Compliance with follow-up visit  Exercise Interventions   Exercise Discussed/Reviewed Exercise Reviewed, Physical Activity  Physical Activity Discussed/Reviewed Physical Activity Reviewed, Types of exercise  Education Interventions   Education Provided Provided Web-based Education  [Web based education on degenerative disc disease, managing chronic back pain, musculoskeletal pain, joint pain]  Provided Verbal Education On Other, When to see the doctor, Mental Health/Coping with Illness  Mental Health Interventions   Mental Health Discussed/Reviewed Mental Health Reviewed, Coping Strategies  Pharmacy Interventions   Pharmacy Dicussed/Reviewed Pharmacy Topics Reviewed, Affording Medications  Safety Interventions   Safety Discussed/Reviewed Safety Reviewed, Fall Risk      Interventions Today    Flowsheet Row Most Recent Value  Chronic Disease   Chronic disease during today's visit Other  [back wound/cyst, back pain]  General Interventions   General Interventions Discussed/Reviewed General Interventions Reviewed  Doctor Visits Discussed/Reviewed Doctor Visits Reviewed, PCP  PCP/Specialist Visits Compliance with follow-up visit  Exercise Interventions   Exercise Discussed/Reviewed Exercise Reviewed, Physical Activity   Physical Activity Discussed/Reviewed Physical Activity Reviewed, Types of exercise  Education Interventions   Education Provided Provided Web-based Education  [Web based education on degenerative disc disease, managing chronic back pain, musculoskeletal pain, joint pain]  Provided Verbal Education On Other, When to see the doctor, Mental Health/Coping with Illness  Mental Health Interventions   Mental Health Discussed/Reviewed Mental Health Reviewed, Coping Strategies  Pharmacy Interventions   Pharmacy Dicussed/Reviewed Pharmacy Topics Reviewed, Affording Medications  Safety Interventions   Safety Discussed/Reviewed Safety Reviewed, Fall Risk               Depression Screen    08/04/2023    8:00 AM 07/07/2023   11:02 AM 03/10/2023   10:08 AM 03/06/2023    9:50 AM  PHQ 2/9 Scores  PHQ - 2 Score 0 0 0 0  PHQ- 9 Score 0 2 0 2     Fall Risk    08/04/2023    8:14 AM  Fall Risk   Falls in the past year? 1  Number falls in past yr: 0  Injury with Fall? 0  Risk for fall due to : History of fall(s)  Follow up Education provided    Cognitive Function        08/04/2023    8:14 AM 08/04/2023    8:11 AM  6CIT Screen  What Year? 0 points   What month? 0 points   What time? 0 points   Count back from 20  0 points  Months in reverse  0 points  Repeat phrase  0 points    Patient Care Team: Bennie Pierini, FNP as PCP - General (Nurse Practitioner) Rollene Rotunda, MD as PCP - Cardiology (Cardiology) Rollene Rotunda, MD as Consulting Physician (Cardiology) Clinton Gallant, RN as [facility] Care Management     Plan:   Welcome to medicare  I have personally reviewed and noted the following in the patient's chart:   Medical and social history Use of alcohol, tobacco or illicit drugs  Current medications and supplements Functional ability and status Nutritional status Physical activity Advanced directives List of other  physicians Hospitalizations, surgeries, and ER visits in previous 12 months Vitals Screenings to include cognitive, depression, and falls Referrals and appointments  In  addition, I have reviewed and discussed with patient certain preventive protocols, quality metrics, and best practice recommendations. A written personalized care plan for preventive services as well as general preventive health recommendations were provided to patient.     Mary-Margaret Daphine Deutscher, [place] 08/04/2023

## 2023-08-04 NOTE — Patient Instructions (Signed)

## 2023-09-08 ENCOUNTER — Ambulatory Visit: Payer: BC Managed Care – PPO | Admitting: Nurse Practitioner

## 2023-09-18 ENCOUNTER — Other Ambulatory Visit: Payer: Self-pay | Admitting: *Deleted

## 2023-09-18 NOTE — Patient Instructions (Addendum)
 Visit Information  Thank you for taking time to visit with me today. Please don't hesitate to contact me if I can be of assistance to you before our next scheduled telephone appointment.  Following are the goals we discussed today:   Goals Addressed             This Visit's Progress    RNCM Care Management Expected Outcomes: Monitor, Self-Manage and Reduce Symptoms of: HTN, Pain Mgmt       Current Barriers:  Knowledge Deficits related to plan of care for management of HTN and Chronic Pain  Chronic Disease Management support and education needs related to HTN and Chronic Pain   RNCM Clinical Goal(s):  Patient will verbalize basic understanding of  HTN and Chronic Pain disease process and self health management plan as evidenced by verbal explanation, recognizing symptoms, lifestyle modifications take all medications exactly as prescribed and will call provider for medication related questions as evidenced by compliance with  medications attend all scheduled medical appointments: with primary care provider and specialist as evidenced by keeping all scheduled appointments demonstrate Improved and Ongoing adherence to prescribed treatment plan for HTN and Chronic Pain as evidenced by consistent medication compliance, symptom monitoring, continued lifestyle modifications continue to work with RN Care Manager to address care management and care coordination needs related to  HTN and Chronic Pain as evidenced by adherence to CM Team Scheduled appointments through collaboration with RN Care manager, provider, and care team.   Interventions: Evaluation of current treatment plan related to  self management and patient's adherence to plan as established by provider   Hypertension Interventions:  (Status:  New goal. and Goal on track:  Yes.) Long Term Goal Last practice recorded BP readings:  BP Readings from Last 3 Encounters:  08/04/23 (!) 140/80  07/07/23 137/78  03/10/23 (!) 140/80   Most  recent eGFR/CrCl:  Lab Results  Component Value Date   EGFR 84 07/07/2023    No components found for: CRCL  Evaluation of current treatment plan related to hypertension self management and patient's adherence to plan as established by provider Provided education to patient re: stroke prevention, s/s of heart attack and stroke Reviewed medications with patient and discussed importance of compliance Counseled on adverse effects of illicit drug and excessive alcohol use in patients with high blood pressure  Counseled on the importance of exercise goals with target of 150 minutes per week Discussed plans with patient for ongoing care management follow up and provided patient with direct contact information for care management team Advised patient, providing education and rationale, to monitor blood pressure daily and record, calling PCP for findings outside established parameters. Patient does not regularly check blood pressure. RNCM advised to start checking blood pressure daily and keeping log. Reviewed scheduled/upcoming provider appointments including: 12-30-2023 w/ PCP Advised patient to discuss changes with provider Provided education on prescribed diet Heart Healthy Discussed complications of poorly controlled blood pressure such as heart disease, stroke, circulatory complications, vision complications, kidney impairment, sexual dysfunction Screening for signs and symptoms of depression related to chronic disease state  Assessed social determinant of health barriers  Pain Interventions:  (Status:  New goal. and Goal on track:  Yes.) Long Term Goal Pain assessment performed. Reports that his pain is usually worse in the evenings. Medications reviewed Reviewed provider established plan for pain management Discussed importance of adherence to all scheduled medical appointments Counseled on the importance of reporting any/all new or changed pain symptoms or management strategies to pain  management provider Advised patient to report to care team affect of pain on daily activities Discussed use of relaxation techniques and/or diversional activities to assist with pain reduction (distraction, imagery, relaxation, massage, acupressure, TENS, heat, and cold application Reviewed with patient prescribed pharmacological and nonpharmacological pain relief strategies Advised patient to discuss any new changes or increasing pain with provider Screening for signs and symptoms of depression related to chronic disease state  Assessed social determinant of health barriers  Patient Goals/Self-Care Activities: Take all medications as prescribed Attend all scheduled provider appointments Call pharmacy for medication refills 3-7 days in advance of running out of medications Perform all self care activities independently  Perform IADL's (shopping, preparing meals, housekeeping, managing finances) independently Call provider office for new concerns or questions  check blood pressure daily write blood pressure results in a log or diary take blood pressure log to all doctor appointments keep all doctor appointments report new symptoms to your doctor eat more whole grains, fruits and vegetables, lean meats and healthy fats  Follow Up Plan:  Telephone follow up appointment with care management team member scheduled for:  11-03-2023 at 9:00 am             Our next appointment is by telephone on 11-03-2023 at 9:00 am  Please call the care guide team at [phone] if you need to cancel or reschedule your appointment.   If you are experiencing a Mental Health or Behavioral Health Crisis or need someone to talk to, please call the Suicide and Crisis Lifeline: 988 call the [place]  National Suicide Prevention Lifeline: 559-357-4872 or TTY: 825-839-1890 TTY 609-091-9724) to talk to a trained counselor call 1-800-273-TALK (toll free, 24 hour hotline) go to [facility] [address] [place] [phone]) call 911   Patient verbalizes understanding of instructions and care plan provided today and agrees to view in MyChart. Active MyChart status and patient understanding of how to access instructions and care plan via MyChart confirmed with patient.     Telephone follow up appointment with care management team member scheduled for:11-03-2023 at 9:00 am  Rosina Forte, BSN RN RN Care Manager  [facility]  Ambulatory Care Management  Direct Number: [phone]

## 2023-09-18 NOTE — Patient Outreach (Addendum)
 Care Management   Visit Note  09/18/2023 Name: Eric Saunders MRN: [id number] DOB: Feb 13, 1956  Subjective: Eric Saunders is a 68 y.o. year old male who is a primary care patient of Gladis Mustard, FNP. The Care Management team was consulted for assistance.      Engaged with patient spoke with patient by telephone.   Goals Addressed             This Visit's Progress    RNCM Care Management Expected Outcomes: Monitor, Self-Manage and Reduce Symptoms of: HTN, Pain Mgmt       Current Barriers:  Knowledge Deficits related to plan of care for management of HTN and Chronic Pain  Chronic Disease Management support and education needs related to HTN and Chronic Pain   RNCM Clinical Goal(s):  Patient will verbalize basic understanding of  HTN and Chronic Pain disease process and self health management plan as evidenced by verbal explanation, recognizing symptoms, lifestyle modifications take all medications exactly as prescribed and will call provider for medication related questions as evidenced by compliance with  medications attend all scheduled medical appointments: with primary care provider and specialist as evidenced by keeping all scheduled appointments demonstrate Improved and Ongoing adherence to prescribed treatment plan for HTN and Chronic Pain as evidenced by consistent medication compliance, symptom monitoring, continued lifestyle modifications continue to work with RN Care Manager to address care management and care coordination needs related to  HTN and Chronic Pain as evidenced by adherence to CM Team Scheduled appointments through collaboration with RN Care manager, provider, and care team.   Interventions: Evaluation of current treatment plan related to  self management and patient's adherence to plan as established by provider   Hypertension Interventions:  (Status:  New goal. and Goal on track:  Yes.) Long Term Goal Last practice recorded BP readings:  BP Readings  from Last 3 Encounters:  08/04/23 (!) 140/80  07/07/23 137/78  03/10/23 (!) 140/80   Most recent eGFR/CrCl:  Lab Results  Component Value Date   EGFR 84 07/07/2023    No components found for: CRCL  Evaluation of current treatment plan related to hypertension self management and patient's adherence to plan as established by provider Provided education to patient re: stroke prevention, s/s of heart attack and stroke Reviewed medications with patient and discussed importance of compliance Counseled on adverse effects of illicit drug and excessive alcohol use in patients with high blood pressure  Counseled on the importance of exercise goals with target of 150 minutes per week Discussed plans with patient for ongoing care management follow up and provided patient with direct contact information for care management team Advised patient, providing education and rationale, to monitor blood pressure daily and record, calling PCP for findings outside established parameters. Patient does not regularly check blood pressure. RNCM advised to start checking blood pressure daily and keeping log. Reviewed scheduled/upcoming provider appointments including: 12-30-2023 w/ PCP Advised patient to discuss changes with provider Provided education on prescribed diet Heart Healthy Discussed complications of poorly controlled blood pressure such as heart disease, stroke, circulatory complications, vision complications, kidney impairment, sexual dysfunction Screening for signs and symptoms of depression related to chronic disease state  Assessed social determinant of health barriers  Pain Interventions:  (Status:  New goal. and Goal on track:  Yes.) Long Term Goal Pain assessment performed. Reports that his pain is usually worse in the evenings. Medications reviewed Reviewed provider established plan for pain management Discussed importance of adherence to all scheduled medical  appointments Counseled on the  importance of reporting any/all new or changed pain symptoms or management strategies to pain management provider Advised patient to report to care team affect of pain on daily activities Discussed use of relaxation techniques and/or diversional activities to assist with pain reduction (distraction, imagery, relaxation, massage, acupressure, TENS, heat, and cold application Reviewed with patient prescribed pharmacological and nonpharmacological pain relief strategies Advised patient to discuss any new changes or increasing pain with provider Screening for signs and symptoms of depression related to chronic disease state  Assessed social determinant of health barriers  Patient Goals/Self-Care Activities: Take all medications as prescribed Attend all scheduled provider appointments Call pharmacy for medication refills 3-7 days in advance of running out of medications Perform all self care activities independently  Perform IADL's (shopping, preparing meals, housekeeping, managing finances) independently Call provider office for new concerns or questions  check blood pressure daily write blood pressure results in a log or diary take blood pressure log to all doctor appointments keep all doctor appointments report new symptoms to your doctor eat more whole grains, fruits and vegetables, lean meats and healthy fats  Follow Up Plan:  Telephone follow up appointment with care management team member scheduled for:  11-03-2023 at 9:00 am             Consent to Services:  Patient was given information about care management services, agreed to services, and gave verbal consent to participate.   Plan: Telephone follow up appointment with care management team member scheduled for:11-03-2023 at 9:00 am  Rosina Forte, BSN RN RN Care Manager  [facility]  Ambulatory Care Management  Direct Number: [phone]

## 2023-09-18 NOTE — Progress Notes (Signed)
 Attempted to reach patient regarding follow-up LDCT. Unable to reach patient directly. Detailed VM left asking that the patient return my call.

## 2023-10-01 NOTE — Progress Notes (Signed)
 Attempted to reach patient to schedule follow-up LDCT. Unable to reach patient directly. Message left with patient's wife who will have the patient reach out to me for scheduling.

## 2023-10-17 ENCOUNTER — Other Ambulatory Visit: Payer: Self-pay

## 2023-10-17 DIAGNOSIS — Z122 Encounter for screening for malignant neoplasm of respiratory organs: Secondary | ICD-10-CM

## 2023-10-17 DIAGNOSIS — Z87891 Personal history of nicotine dependence: Secondary | ICD-10-CM

## 2023-10-28 ENCOUNTER — Other Ambulatory Visit: Payer: Self-pay | Admitting: Nurse Practitioner

## 2023-11-03 ENCOUNTER — Other Ambulatory Visit: Payer: Self-pay | Admitting: *Deleted

## 2023-11-03 ENCOUNTER — Encounter: Payer: Self-pay | Admitting: *Deleted

## 2023-11-03 ENCOUNTER — Telehealth: Payer: Self-pay | Admitting: *Deleted

## 2023-11-03 NOTE — Patient Outreach (Signed)
  Care Management   Follow Up Note   11/03/2023 Name: Eric Saunders MRN: [id number] DOB: [birth year]   Referred by: Bennie Pierini, FNP Reason for referral : Care Management (RNCM: ATTEMPT Follow Up For Chronic Disease Management & Car Coordination Needs)   An unsuccessful telephone outreach was attempted today. The patient was referred to the case management team for assistance with care management and care coordination.   Follow Up Plan: The care management team will reach out to the patient again over the next 30 days.   Larey Brick, BSN RN [facility] RN Care Manager Direct Dial: [phone]  Fax: [phone]

## 2023-11-03 NOTE — Patient Outreach (Signed)
Care Management   Visit Note  11/03/2023 Name: Eric Saunders MRN: [id number] DOB: 12/11/1955  Subjective: Eric Saunders is a 68 y.o. year old male who is a primary care patient of Eric Pierini, FNP. The Care Management team was consulted for assistance.      Engaged with patient spoke with patient by telephone.    Goals Addressed             This Visit's Progress    RNCM Care Management Expected Outcomes: Monitor, Self-Manage and Reduce Symptoms of: HTN, Pain Mgmt       Current Barriers:  Knowledge Deficits related to plan of care for management of HTN and Chronic Pain  Chronic Disease Management support and education needs related to HTN and Chronic Pain   RNCM Clinical Goal(s):  Patient will verbalize basic understanding of  HTN and Chronic Pain disease process and self health management plan as evidenced by verbal explanation, recognizing symptoms, lifestyle modifications take all medications exactly as prescribed and will call provider for medication related questions as evidenced by compliance with  medications attend all scheduled medical appointments: with primary care provider and specialist as evidenced by keeping all scheduled appointments demonstrate Improved and Ongoing adherence to prescribed treatment plan for HTN and Chronic Pain as evidenced by consistent medication compliance, symptom monitoring, continued lifestyle modifications continue to work with RN Care Manager to address care management and care coordination needs related to  HTN and Chronic Pain as evidenced by adherence to CM Team Scheduled appointments through collaboration with RN Care manager, provider, and care team.   Interventions: Evaluation of current treatment plan related to  self management and patient's adherence to plan as established by provider   Hypertension Interventions:  (Status:  New goal. and Goal on track:  Yes.) Long Term Goal Last practice recorded BP readings:  BP  Readings from Last 3 Encounters:  08/04/23 (!) 140/80  07/07/23 137/78  03/10/23 (!) 140/80   Most recent eGFR/CrCl:  Lab Results  Component Value Date   EGFR 84 07/07/2023    No components found for: "CRCL"  Evaluation of current treatment plan related to hypertension self management and patient's adherence to plan as established by provider. Does not check regularly but reports his last reading of 130/80. Denies any chest pain, swelling, dizziness or headaches. Provided education to patient re: stroke prevention, s/s of heart attack and stroke. Education and support provided. Reviewed medications with patient and discussed importance of compliance. Reports compliance with all medications Counseled on adverse effects of illicit drug and excessive alcohol use in patients with high blood pressure. Denies any illicit dug or excessive alcohol usage Counseled on the importance of exercise goals with target of 150 minutes per week. Reports being active but limited recently due to the cold weather. Discussed plans with patient for ongoing care management follow up and provided patient with direct contact information for care management team Advised patient, providing education and rationale, to monitor blood pressure daily and record, calling PCP for findings outside established parameters. RNCM reviewed with patient goal for SBP<140 DBP<90 Reviewed scheduled/upcoming provider appointments including: 12-30-2023 w/ PCP Advised patient to discuss changes with provider Provided education on prescribed diet Heart Healthy Discussed complications of poorly controlled blood pressure such as heart disease, stroke, circulatory complications, vision complications, kidney impairment, sexual dysfunction Screening for signs and symptoms of depression related to chronic disease state  Assessed social determinant of health barriers  Pain Interventions:  (Status:  New goal. and Goal  on track:  Yes.) Long Term  Goal Pain assessment performed. Reports back/neck pain 4/10.  Medications reviewed. Reports compliance with his treatment plan Reviewed provider established plan for pain management Discussed importance of adherence to all scheduled medical appointments Counseled on the importance of reporting any/all new or changed pain symptoms or management strategies to pain management provider Advised patient to report to care team affect of pain on daily activities Discussed use of relaxation techniques and/or diversional activities to assist with pain reduction (distraction, imagery, relaxation, massage, acupressure, TENS, heat, and cold application Reviewed with patient prescribed pharmacological and nonpharmacological pain relief strategies Advised patient to discuss any new changes or increasing pain with provider Screening for signs and symptoms of depression related to chronic disease state  Assessed social determinant of health barriers  Patient Goals/Self-Care Activities: Take all medications as prescribed Attend all scheduled provider appointments Call pharmacy for medication refills 3-7 days in advance of running out of medications Perform all self care activities independently  Perform IADL's (shopping, preparing meals, housekeeping, managing finances) independently Call provider office for new concerns or questions  check blood pressure daily write blood pressure results in a log or diary take blood pressure log to all doctor appointments keep all doctor appointments report new symptoms to your doctor eat more whole grains, fruits and vegetables, lean meats and healthy fats  Follow Up Plan:  Telephone follow up appointment with care management team member scheduled for:  12-02-2023 at 11:45 am             Consent to Services:  Patient was given information about care management services, agreed to services, and gave verbal consent to participate.   Plan: Telephone follow up  appointment with care management team member scheduled for:12-02-2023 at 11:45 am  Larey Brick, BSN RN [facility], [facility] RN Care Manager Direct Dial: [phone]  Fax: [phone]

## 2023-11-03 NOTE — Patient Instructions (Signed)
Visit Information  Thank you for taking time to visit with me today. Please don't hesitate to contact me if I can be of assistance to you before our next scheduled telephone appointment.  Following are the goals we discussed today:   Goals Addressed             This Visit's Progress    RNCM Care Management Expected Outcomes: Monitor, Self-Manage and Reduce Symptoms of: HTN, Pain Mgmt       Current Barriers:  Knowledge Deficits related to plan of care for management of HTN and Chronic Pain  Chronic Disease Management support and education needs related to HTN and Chronic Pain   RNCM Clinical Goal(s):  Patient will verbalize basic understanding of  HTN and Chronic Pain disease process and self health management plan as evidenced by verbal explanation, recognizing symptoms, lifestyle modifications take all medications exactly as prescribed and will call provider for medication related questions as evidenced by compliance with  medications attend all scheduled medical appointments: with primary care provider and specialist as evidenced by keeping all scheduled appointments demonstrate Improved and Ongoing adherence to prescribed treatment plan for HTN and Chronic Pain as evidenced by consistent medication compliance, symptom monitoring, continued lifestyle modifications continue to work with RN Care Manager to address care management and care coordination needs related to  HTN and Chronic Pain as evidenced by adherence to CM Team Scheduled appointments through collaboration with RN Care manager, provider, and care team.   Interventions: Evaluation of current treatment plan related to  self management and patient's adherence to plan as established by provider   Hypertension Interventions:  (Status:  New goal. and Goal on track:  Yes.) Long Term Goal Last practice recorded BP readings:  BP Readings from Last 3 Encounters:  08/04/23 (!) 140/80  07/07/23 137/78  03/10/23 (!) 140/80   Most  recent eGFR/CrCl:  Lab Results  Component Value Date   EGFR 84 07/07/2023    No components found for: "CRCL"  Evaluation of current treatment plan related to hypertension self management and patient's adherence to plan as established by provider. Does not check regularly but reports his last reading of 130/80. Denies any chest pain, swelling, dizziness or headaches. Provided education to patient re: stroke prevention, s/s of heart attack and stroke. Education and support provided. Reviewed medications with patient and discussed importance of compliance. Reports compliance with all medications Counseled on adverse effects of illicit drug and excessive alcohol use in patients with high blood pressure. Denies any illicit dug or excessive alcohol usage Counseled on the importance of exercise goals with target of 150 minutes per week. Reports being active but limited recently due to the cold weather. Discussed plans with patient for ongoing care management follow up and provided patient with direct contact information for care management team Advised patient, providing education and rationale, to monitor blood pressure daily and record, calling PCP for findings outside established parameters. RNCM reviewed with patient goal for SBP<140 DBP<90 Reviewed scheduled/upcoming provider appointments including: 12-30-2023 w/ PCP Advised patient to discuss changes with provider Provided education on prescribed diet Heart Healthy Discussed complications of poorly controlled blood pressure such as heart disease, stroke, circulatory complications, vision complications, kidney impairment, sexual dysfunction Screening for signs and symptoms of depression related to chronic disease state  Assessed social determinant of health barriers  Pain Interventions:  (Status:  New goal. and Goal on track:  Yes.) Long Term Goal Pain assessment performed. Reports back/neck pain 4/10.  Medications reviewed. Reports compliance  with his treatment plan Reviewed provider established plan for pain management Discussed importance of adherence to all scheduled medical appointments Counseled on the importance of reporting any/all new or changed pain symptoms or management strategies to pain management provider Advised patient to report to care team affect of pain on daily activities Discussed use of relaxation techniques and/or diversional activities to assist with pain reduction (distraction, imagery, relaxation, massage, acupressure, TENS, heat, and cold application Reviewed with patient prescribed pharmacological and nonpharmacological pain relief strategies Advised patient to discuss any new changes or increasing pain with provider Screening for signs and symptoms of depression related to chronic disease state  Assessed social determinant of health barriers  Patient Goals/Self-Care Activities: Take all medications as prescribed Attend all scheduled provider appointments Call pharmacy for medication refills 3-7 days in advance of running out of medications Perform all self care activities independently  Perform IADL's (shopping, preparing meals, housekeeping, managing finances) independently Call provider office for new concerns or questions  check blood pressure daily write blood pressure results in a log or diary take blood pressure log to all doctor appointments keep all doctor appointments report new symptoms to your doctor eat more whole grains, fruits and vegetables, lean meats and healthy fats  Follow Up Plan:  Telephone follow up appointment with care management team member scheduled for:  12-02-2023 at 11:45 am           Our next appointment is by telephone on 12-02-2023 at 11:45 am  Please call the care guide team at [phone] if you need to cancel or reschedule your appointment.   If you are experiencing a Mental Health or Behavioral Health Crisis or need someone to talk to, please call the  Suicide and Crisis Lifeline: 988 call the [place] National Suicide Prevention Lifeline: 848-392-2601 or TTY: 325-346-4293 TTY (339) 687-9827) to talk to a trained counselor call 1-800-273-TALK (toll free, 24 hour hotline)   Patient verbalizes understanding of instructions and care plan provided today and agrees to view in MyChart. Active MyChart status and patient understanding of how to access instructions and care plan via MyChart confirmed with patient.     Telephone follow up appointment with care management team member scheduled for:12-02-2023 at 11:45 am  Larey Brick, BSN RN [facility], [facility] RN Care Manager Direct Dial: [phone]  Fax: [phone]

## 2023-11-11 ENCOUNTER — Encounter: Payer: Self-pay | Admitting: *Deleted

## 2023-11-25 ENCOUNTER — Other Ambulatory Visit: Payer: Self-pay | Admitting: Nurse Practitioner

## 2023-12-02 ENCOUNTER — Other Ambulatory Visit: Payer: Self-pay | Admitting: *Deleted

## 2023-12-02 NOTE — Patient Outreach (Signed)
 Care Management   Visit Note  12/02/2023 Name: Eric Saunders MRN: [id number] DOB: 04-12-1956  Subjective: Eric Saunders is a 68 y.o. year old male who is a primary care patient of Bennie Pierini, FNP. The Care Management team was consulted for assistance.      Engaged with patient spoke with patient by telephone.    Goals Addressed             This Visit's Progress    COMPLETED: RNCM Care Management Expected Outcomes: Monitor, Self-Manage and Reduce Symptoms of: HTN, Pain Mgmt       Current Barriers:  Knowledge Deficits related to plan of care for management of HTN and Chronic Pain  Chronic Disease Management support and education needs related to HTN and Chronic Pain   RNCM Clinical Goal(s):  Patient will verbalize basic understanding of  HTN and Chronic Pain disease process and self health management plan as evidenced by verbal explanation, recognizing symptoms, lifestyle modifications take all medications exactly as prescribed and will call provider for medication related questions as evidenced by compliance with  medications attend all scheduled medical appointments: with primary care provider and specialist as evidenced by keeping all scheduled appointments demonstrate Improved and Ongoing adherence to prescribed treatment plan for HTN and Chronic Pain as evidenced by consistent medication compliance, symptom monitoring, continued lifestyle modifications continue to work with RN Care Manager to address care management and care coordination needs related to  HTN and Chronic Pain as evidenced by adherence to CM Team Scheduled appointments through collaboration with RN Care manager, provider, and care team.   Interventions: Evaluation of current treatment plan related to  self management and patient's adherence to plan as established by provider   Hypertension Interventions:  (Status:  Goal on track:  Yes.) Long Term Goal Last practice recorded BP readings:  BP Readings  from Last 3 Encounters:  08/04/23 (!) 140/80  07/07/23 137/78  03/10/23 (!) 140/80   Most recent eGFR/CrCl:  Lab Results  Component Value Date   EGFR 84 07/07/2023    No components found for: "CRCL"  Evaluation of current treatment plan related to hypertension self management and patient's adherence to plan as established by provider.Checks BP occasionally. Reports that he has been taking OTC cough medicine and morning BP noted to be elevated to 170/89. He is aware that he is supposed to be taking Coricidin due to his BP and will change his OTC medication. He also reports that he is taking his clonidine differently than prescribed and will monitor his BP and take the second dose as recommended today. Denies any chest pain, swelling, dizziness or headaches. Provided education to patient re: stroke prevention, s/s of heart attack and stroke. Education and support provided. Reviewed medications with patient and discussed importance of compliance. Reports compliance with all medications Counseled on adverse effects of illicit drug and excessive alcohol use in patients with high blood pressure. Denies any illicit dug or excessive alcohol usage Counseled on the importance of exercise goals with target of 150 minutes per week. Reports being active but limited recently due to the cold weather. Discussed plans with patient for ongoing care management follow up and provided patient with direct contact information for care management team Advised patient, providing education and rationale, to monitor blood pressure daily and record, calling PCP for findings outside established parameters. RNCM reviewed with patient goal for SBP<140 DBP<90 Reviewed scheduled/upcoming provider appointments including: 12-30-2023 w/ PCP Advised patient to discuss changes with provider Provided education on  prescribed diet Heart Healthy Discussed complications of poorly controlled blood pressure such as heart disease, stroke,  circulatory complications, vision complications, kidney impairment, sexual dysfunction Screening for signs and symptoms of depression related to chronic disease state  Assessed social determinant of health barriers  Pain Interventions:  (Status:  Goal on track:  Yes.) Long Term Goal Pain assessment performed. Reports back/neck pain 5/10.  Medications reviewed. Reports compliance with his treatment plan Reviewed provider established plan for pain management Discussed importance of adherence to all scheduled medical appointments Counseled on the importance of reporting any/all new or changed pain symptoms or management strategies to pain management provider Advised patient to report to care team affect of pain on daily activities Discussed use of relaxation techniques and/or diversional activities to assist with pain reduction (distraction, imagery, relaxation, massage, acupressure, TENS, heat, and cold application Reviewed with patient prescribed pharmacological and nonpharmacological pain relief strategies Advised patient to discuss any new changes or increasing pain with provider Screening for signs and symptoms of depression related to chronic disease state  Assessed social determinant of health barriers  Patient Goals/Self-Care Activities: Take all medications as prescribed Attend all scheduled provider appointments Call pharmacy for medication refills 3-7 days in advance of running out of medications Perform all self care activities independently  Perform IADL's (shopping, preparing meals, housekeeping, managing finances) independently Call provider office for new concerns or questions  check blood pressure daily write blood pressure results in a log or diary take blood pressure log to all doctor appointments keep all doctor appointments report new symptoms to your doctor eat more whole grains, fruits and vegetables, lean meats and healthy fats  Follow Up Plan:  No further follow  up required: Does not meet continued criteria for complex case management             Consent to Services:  Patient was given information about care management services, agreed to services, and gave verbal consent to participate.   Plan: No further follow up required:    Larey Brick, BSN RN [facility], [facility] RN Care Manager Direct Dial: [phone]  Fax: [phone]

## 2023-12-02 NOTE — Patient Instructions (Signed)
 Visit Information  Thank you for taking time to visit with me today. Please don't hesitate to contact me if I can be of assistance to you before our next scheduled telephone appointment.  Following are the goals we discussed today:   Goals Addressed             This Visit's Progress    COMPLETED: RNCM Care Management Expected Outcomes: Monitor, Self-Manage and Reduce Symptoms of: HTN, Pain Mgmt       Current Barriers:  Knowledge Deficits related to plan of care for management of HTN and Chronic Pain  Chronic Disease Management support and education needs related to HTN and Chronic Pain   RNCM Clinical Goal(s):  Patient will verbalize basic understanding of  HTN and Chronic Pain disease process and self health management plan as evidenced by verbal explanation, recognizing symptoms, lifestyle modifications take all medications exactly as prescribed and will call provider for medication related questions as evidenced by compliance with  medications attend all scheduled medical appointments: with primary care provider and specialist as evidenced by keeping all scheduled appointments demonstrate Improved and Ongoing adherence to prescribed treatment plan for HTN and Chronic Pain as evidenced by consistent medication compliance, symptom monitoring, continued lifestyle modifications continue to work with RN Care Manager to address care management and care coordination needs related to  HTN and Chronic Pain as evidenced by adherence to CM Team Scheduled appointments through collaboration with RN Care manager, provider, and care team.   Interventions: Evaluation of current treatment plan related to  self management and patient's adherence to plan as established by provider   Hypertension Interventions:  (Status:  Goal on track:  Yes.) Long Term Goal Last practice recorded BP readings:  BP Readings from Last 3 Encounters:  08/04/23 (!) 140/80  07/07/23 137/78  03/10/23 (!) 140/80   Most  recent eGFR/CrCl:  Lab Results  Component Value Date   EGFR 84 07/07/2023    No components found for: "CRCL"  Evaluation of current treatment plan related to hypertension self management and patient's adherence to plan as established by provider.Checks BP occasionally. Reports that he has been taking OTC cough medicine and morning BP noted to be elevated to 170/89. He is aware that he is supposed to be taking Coricidin due to his BP and will change his OTC medication. He also reports that he is taking his clonidine differently than prescribed and will monitor his BP and take the second dose as recommended today. Denies any chest pain, swelling, dizziness or headaches. Provided education to patient re: stroke prevention, s/s of heart attack and stroke. Education and support provided. Reviewed medications with patient and discussed importance of compliance. Reports compliance with all medications Counseled on adverse effects of illicit drug and excessive alcohol use in patients with high blood pressure. Denies any illicit dug or excessive alcohol usage Counseled on the importance of exercise goals with target of 150 minutes per week. Reports being active but limited recently due to the cold weather. Discussed plans with patient for ongoing care management follow up and provided patient with direct contact information for care management team Advised patient, providing education and rationale, to monitor blood pressure daily and record, calling PCP for findings outside established parameters. RNCM reviewed with patient goal for SBP<140 DBP<90 Reviewed scheduled/upcoming provider appointments including: 12-30-2023 w/ PCP Advised patient to discuss changes with provider Provided education on prescribed diet Heart Healthy Discussed complications of poorly controlled blood pressure such as heart disease, stroke, circulatory complications, vision  complications, kidney impairment, sexual dysfunction Screening  for signs and symptoms of depression related to chronic disease state  Assessed social determinant of health barriers  Pain Interventions:  (Status:  Goal on track:  Yes.) Long Term Goal Pain assessment performed. Reports back/neck pain 5/10.  Medications reviewed. Reports compliance with his treatment plan Reviewed provider established plan for pain management Discussed importance of adherence to all scheduled medical appointments Counseled on the importance of reporting any/all new or changed pain symptoms or management strategies to pain management provider Advised patient to report to care team affect of pain on daily activities Discussed use of relaxation techniques and/or diversional activities to assist with pain reduction (distraction, imagery, relaxation, massage, acupressure, TENS, heat, and cold application Reviewed with patient prescribed pharmacological and nonpharmacological pain relief strategies Advised patient to discuss any new changes or increasing pain with provider Screening for signs and symptoms of depression related to chronic disease state  Assessed social determinant of health barriers  Patient Goals/Self-Care Activities: Take all medications as prescribed Attend all scheduled provider appointments Call pharmacy for medication refills 3-7 days in advance of running out of medications Perform all self care activities independently  Perform IADL's (shopping, preparing meals, housekeeping, managing finances) independently Call provider office for new concerns or questions  check blood pressure daily write blood pressure results in a log or diary take blood pressure log to all doctor appointments keep all doctor appointments report new symptoms to your doctor eat more whole grains, fruits and vegetables, lean meats and healthy fats  Follow Up Plan:  No further follow up required: Does not meet continued criteria for complex case management            Please  call the care guide team at [phone] if you need to cancel or reschedule your appointment.   If you are experiencing a Mental Health or Behavioral Health Crisis or need someone to talk to, please call the Suicide and Crisis Lifeline: 988 call the [place] National Suicide Prevention Lifeline: (719) 110-0144 or TTY: 825-836-5725 TTY (304)822-4266) to talk to a trained counselor call 1-800-273-TALK (toll free, 24 hour hotline)   Patient verbalizes understanding of instructions and care plan provided today and agrees to view in MyChart. Active MyChart status and patient understanding of how to access instructions and care plan via MyChart confirmed with patient.     No further follow up required:    Larey Brick, BSN RN [facility], [facility] RN Care Manager Direct Dial: [phone]  Fax: [phone]

## 2023-12-09 ENCOUNTER — Ambulatory Visit (HOSPITAL_COMMUNITY)
Admission: RE | Admit: 2023-12-09 | Discharge: 2023-12-09 | Disposition: A | Discharge status: Home / Self Care | Payer: Medicare HMO | Source: Ambulatory Visit | Attending: Oncology | Admitting: Oncology

## 2023-12-09 DIAGNOSIS — F17210 Nicotine dependence, cigarettes, uncomplicated: Secondary | ICD-10-CM | POA: Diagnosis not present

## 2023-12-09 DIAGNOSIS — Z87891 Personal history of nicotine dependence: Secondary | ICD-10-CM | POA: Insufficient documentation

## 2023-12-09 DIAGNOSIS — Z122 Encounter for screening for malignant neoplasm of respiratory organs: Secondary | ICD-10-CM | POA: Insufficient documentation

## 2023-12-21 ENCOUNTER — Other Ambulatory Visit: Payer: Self-pay | Admitting: Nurse Practitioner

## 2023-12-30 ENCOUNTER — Ambulatory Visit: Payer: Medicare HMO | Admitting: Nurse Practitioner

## 2023-12-31 ENCOUNTER — Other Ambulatory Visit: Payer: Self-pay | Admitting: Nurse Practitioner

## 2023-12-31 DIAGNOSIS — E785 Hyperlipidemia, unspecified: Secondary | ICD-10-CM

## 2023-12-31 DIAGNOSIS — I10 Essential (primary) hypertension: Secondary | ICD-10-CM

## 2023-12-31 DIAGNOSIS — K219 Gastro-esophageal reflux disease without esophagitis: Secondary | ICD-10-CM

## 2023-12-31 DIAGNOSIS — I2510 Atherosclerotic heart disease of native coronary artery without angina pectoris: Secondary | ICD-10-CM

## 2024-01-05 NOTE — Progress Notes (Signed)
Patient notified of LDCT Lung Cancer Screening Results via mail with the recommendation to follow-up in 12 months. Patient's referring provider has been sent a copy of results. Results are as follows:   IMPRESSION: Lung-RADS 1, negative. Continue annual screening with low-dose chest CT without contrast in 12 months.   Aortic Atherosclerosis (ICD10-I70.0) and Emphysema (ICD10-J43.9). 

## 2024-01-06 ENCOUNTER — Ambulatory Visit (INDEPENDENT_AMBULATORY_CARE_PROVIDER_SITE_OTHER): Payer: Medicare HMO | Admitting: Nurse Practitioner

## 2024-01-06 ENCOUNTER — Encounter: Payer: Self-pay | Admitting: Nurse Practitioner

## 2024-01-06 VITALS — BP 162/87 | HR 54 | Temp 97.9°F | Ht 68.0 in | Wt 185.0 lb

## 2024-01-06 DIAGNOSIS — E785 Hyperlipidemia, unspecified: Secondary | ICD-10-CM

## 2024-01-06 DIAGNOSIS — Z0001 Encounter for general adult medical examination with abnormal findings: Secondary | ICD-10-CM | POA: Diagnosis not present

## 2024-01-06 DIAGNOSIS — Z720 Tobacco use: Secondary | ICD-10-CM | POA: Diagnosis not present

## 2024-01-06 DIAGNOSIS — I2510 Atherosclerotic heart disease of native coronary artery without angina pectoris: Secondary | ICD-10-CM

## 2024-01-06 DIAGNOSIS — J410 Simple chronic bronchitis: Secondary | ICD-10-CM

## 2024-01-06 DIAGNOSIS — K219 Gastro-esophageal reflux disease without esophagitis: Secondary | ICD-10-CM | POA: Diagnosis not present

## 2024-01-06 DIAGNOSIS — Z0000 Encounter for general adult medical examination without abnormal findings: Secondary | ICD-10-CM

## 2024-01-06 DIAGNOSIS — Z79891 Long term (current) use of opiate analgesic: Secondary | ICD-10-CM | POA: Diagnosis not present

## 2024-01-06 DIAGNOSIS — E66811 Obesity, class 1: Secondary | ICD-10-CM

## 2024-01-06 DIAGNOSIS — I10 Essential (primary) hypertension: Secondary | ICD-10-CM | POA: Diagnosis not present

## 2024-01-06 DIAGNOSIS — G459 Transient cerebral ischemic attack, unspecified: Secondary | ICD-10-CM

## 2024-01-06 DIAGNOSIS — F411 Generalized anxiety disorder: Secondary | ICD-10-CM

## 2024-01-06 LAB — LIPID PANEL

## 2024-01-06 MED ORDER — ESCITALOPRAM OXALATE 20 MG PO TABS
40.0000 mg | ORAL_TABLET | Freq: Every day | ORAL | 1 refills | Status: DC
Start: 2024-01-06 — End: 2024-07-20

## 2024-01-06 MED ORDER — OMEPRAZOLE 40 MG PO CPDR: 40.0000 mg | DELAYED_RELEASE_CAPSULE | Freq: Two times a day (BID) | ORAL | 1 refills | Status: DC

## 2024-01-06 MED ORDER — LOSARTAN POTASSIUM 100 MG PO TABS: 100.0000 mg | ORAL_TABLET | Freq: Every day | ORAL | 1 refills | Status: DC

## 2024-01-06 MED ORDER — FENOFIBRATE 160 MG PO TABS: 160.0000 mg | ORAL_TABLET | Freq: Every day | ORAL | 1 refills | Status: DC

## 2024-01-06 MED ORDER — ALPRAZOLAM 0.5 MG PO TABS: 0.5000 mg | ORAL_TABLET | Freq: Two times a day (BID) | ORAL | 2 refills | Status: DC | PRN

## 2024-01-06 MED ORDER — ROSUVASTATIN CALCIUM 40 MG PO TABS
40.0000 mg | ORAL_TABLET | Freq: Every day | ORAL | 1 refills | Status: DC
Start: 2024-01-06 — End: 2024-07-20

## 2024-01-06 MED ORDER — METOPROLOL SUCCINATE ER 25 MG PO TB24: 25.0000 mg | ORAL_TABLET | Freq: Every day | ORAL | 1 refills | Status: DC

## 2024-01-06 MED ORDER — CLONIDINE HCL 0.2 MG PO TABS: 0.2000 mg | ORAL_TABLET | Freq: Two times a day (BID) | ORAL | 1 refills | Status: DC

## 2024-01-06 MED ORDER — AMLODIPINE BESYLATE 10 MG PO TABS: 10.0000 mg | ORAL_TABLET | Freq: Every day | ORAL | 1 refills | Status: DC

## 2024-01-06 NOTE — Progress Notes (Addendum)
 Subjective:    Patient ID: Eric Saunders, male    DOB: [birth year], 68 y.o.   MRN: [id number]   Chief Complaint: annual physical    HPI:  Eric Saunders is a 68 y.o. who identifies as a male who was assigned male at birth.   Social history: Lives with: wife Work history: disability   Comes in today for follow up of the following chronic medical issues:  1. Essential hypertension No c/o chest pain, sob or headache. Does not check blood pressure at home. BP Readings from Last 3 Encounters:  08/04/23 (!) 140/80  07/07/23 137/78  03/10/23 (!) 140/80     2. Coronary arteriosclerosis Last saw cardiology on 11/27/22. Review of office note showed no change I plan of care.  3. TIA (transient ischemic attack) No permanent effects  4. Simple chronic bronchitis (HCC) Has chronic cough. Is currently on no inhalers  5. Gastroesophageal reflux disease without esophagitis Is on daily dose of omeprazole  and is doing well.  6. Hyperlipidemia with target LDL less than 100 Does try to watch diet and stays active but does no dedicated exercise. Lab Results  Component Value Date   CHOL 126 07/07/2023   HDL 55 07/07/2023   LDLCALC 58 07/07/2023   TRIG 62 07/07/2023   CHOLHDL 2.3 07/07/2023     7. Generalized anxiety disorder Is on combination of lexapro  and xanax . Is doing well currently    01/06/2024    8:44 AM 08/04/2023    7:59 AM 07/07/2023   11:03 AM 03/10/2023   10:09 AM  GAD 7 : Generalized Anxiety Score  Nervous, Anxious, on Edge 1 0 1 0  Control/stop worrying 0 0 0 0  Worry too much - different things 0 0 0 0  Trouble relaxing 1 0 0 0  Restless 1 0 1 0  Easily annoyed or irritable 1 0 1 0  Afraid - awful might happen 0 0 0 0  Total GAD 7 Score 4 0 3 0  Anxiety Difficulty Not difficult at all  Not difficult at all Not difficult at all    8. Tobacco abuse Still smokes over a pack a day.- no desire to quit. Had low dose CT scan on 01/03/24-negative for  cancer.  9.BMI 28.0-28.9 Weight is up 7lbs  Wt Readings from Last 3 Encounters:  01/06/24 185 lb (83.9 kg)  08/04/23 178 lb (80.7 kg)  07/07/23 174 lb (78.9 kg)   BMI Readings from Last 3 Encounters:  01/06/24 28.13 kg/m  08/04/23 27.06 kg/m  07/07/23 26.46 kg/m       New complaints: None today   Allergies  Allergen Reactions   Ace Inhibitors Cough   Outpatient Encounter Medications as of 01/06/2024  Medication Sig   ALPRAZolam  (XANAX ) 0.5 MG tablet Take 1 tablet (0.5 mg total) by mouth 2 (two) times daily as needed for anxiety.   amLODipine  (NORVASC ) 10 MG tablet Take 1 tablet (10 mg total) by mouth daily.   aspirin  81 MG tablet Take 81 mg by mouth daily.   benzonatate  (TESSALON ) 100 MG capsule TAKE 1 CAPSULE BY MOUTH THREE TIMES A DAY AS NEEDED FOR COUGH (Patient taking differently: Take 100 mg by mouth 3 (three) times daily as needed for cough.)   cephALEXin  (KEFLEX ) 500 MG capsule Take 1 capsule (500 mg total) by mouth 3 (three) times daily. (Patient not taking: Reported on 12/02/2023)   cloNIDine  (CATAPRES ) 0.1 MG tablet TAKE 1 TABLET BY MOUTH 2 TIMES DAILY.  cyclobenzaprine  (FLEXERIL ) 10 MG tablet TAKE 1 TABLET BY MOUTH TWICE A DAY AS NEEDED   escitalopram  (LEXAPRO ) 20 MG tablet Take 2 tablets (40 mg total) by mouth daily.   fenofibrate  160 MG tablet TAKE 1 TABLET BY MOUTH EVERY DAY   ibuprofen  (ADVIL ) 800 MG tablet TAKE 1 TABLET BY MOUTH THREE TIMES A DAY   losartan  (COZAAR ) 100 MG tablet TAKE 1 TABLET BY MOUTH EVERY DAY   metoprolol  succinate (TOPROL -XL) 25 MG 24 hr tablet TAKE 1 TABLET (25 MG TOTAL) BY MOUTH DAILY.   omeprazole  (PRILOSEC) 40 MG capsule TAKE 1 CAPSULE BY MOUTH TWICE A DAY   rosuvastatin  (CRESTOR ) 40 MG tablet TAKE 1 TABLET BY MOUTH EVERY DAY   sildenafil  (VIAGRA ) 100 MG tablet Take 100 mg by mouth as needed for erectile dysfunction.   triamcinolone  cream (KENALOG ) 0.1 % Apply 1 application topically 2 (two) times daily. (Patient taking  differently: Apply 1 application  topically daily as needed (for itching on arms).)   No facility-administered encounter medications on file as of 01/06/2024.    Past Surgical History:  Procedure Laterality Date   COLONOSCOPY     CORONARY ARTERY BYPASS GRAFT N/A 06/17/2014   Procedure: CORONARY ARTERY BYPASS GRAFTING (CABG) X 4, RIGHT LEG EVH;  Surgeon: Bartley Lightning, MD;  Location: [facility] OR;  Service: Open Heart Surgery;  Laterality: N/A;   KNEE SURGERY     bilateral arthroscopic   LEFT HEART CATHETERIZATION WITH CORONARY ANGIOGRAM N/A 06/16/2014   Procedure: LEFT HEART CATHETERIZATION WITH CORONARY ANGIOGRAM;  Surgeon: Odie Benne, MD;  Location[facility] C[facility] LAB;  Service: Cardiovascular;  Laterality: N/A;   TEE WITHOUT CARDIOVERSION N/A 06/17/2014   Procedure: TRANSESOPHAGEAL ECHOCARDIOGRAM (TEE);  Surgeon: Bartley Lightning, MD;  Location: [facility] OR;  Service: Open Heart Surgery;  Laterality: N/A;   UPPER GASTROINTESTINAL ENDOSCOPY      Family History  Problem Relation Age of Onset   Drug abuse Sister    Diabetes Sister    Healthy Sister    Healthy Sister    Healthy Sister    Healthy Sister    Healthy Brother    Healthy Brother    Colon cancer Neg Hx    Esophageal cancer Neg Hx    Inflammatory bowel disease Neg Hx    Liver disease Neg Hx    Pancreatic cancer Neg Hx    Rectal cancer Neg Hx    Stomach cancer Neg Hx    Stroke Neg Hx       Controlled substance contract: n/a     Review of Systems  Constitutional:  Negative for diaphoresis.  Eyes:  Negative for pain.  Respiratory:  Negative for shortness of breath.   Cardiovascular:  Negative for chest pain, palpitations and leg swelling.  Gastrointestinal:  Negative for abdominal pain.  Endocrine: Negative for polydipsia.  Skin:  Negative for rash.  Neurological:  Negative for dizziness, weakness and headaches.  Hematological:  Does not bruise/bleed easily.  All other systems reviewed and are negative.       Objective:   Physical Exam Vitals and nursing note reviewed.  Constitutional:      Appearance: Normal appearance. He is well-developed.  HENT:     Head: Normocephalic.     Nose: Nose normal.     Mouth/Throat:     Mouth: Mucous membranes are moist.     Pharynx: Oropharynx is clear.  Eyes:     Pupils: Pupils are equal, round, and reactive to light.  Neck:  Thyroid : No thyroid  mass or thyromegaly.     Vascular: No carotid bruit or JVD.     Trachea: Phonation normal.  Cardiovascular:     Rate and Rhythm: Normal rate and regular rhythm.  Pulmonary:     Effort: Pulmonary effort is normal. No respiratory distress.     Breath sounds: Normal breath sounds.  Abdominal:     General: Bowel sounds are normal.     Palpations: Abdomen is soft.     Tenderness: There is no abdominal tenderness.  Musculoskeletal:        General: Normal range of motion.     Cervical back: Normal range of motion and neck supple.  Lymphadenopathy:     Cervical: No cervical adenopathy.  Skin:    General: Skin is warm and dry.  Neurological:     Mental Status: He is alert and oriented to person, place, and time.  Psychiatric:        Behavior: Behavior normal.        Thought Content: Thought content normal.        Judgment: Judgment normal.    BP (!) 162/87   Pulse (!) 54   Temp 97.9 F (36.6 C) (Temporal)   Ht 5\' 8"  (1.727 m)   Wt 185 lb (83.9 kg)   SpO2 98%   BMI 28.13 kg/m          Assessment & Plan:  KEONTAY VORA comes in today with chief complaint of No chief complaint on file.   Diagnosis and orders addressed:  1. Essential hypertension Low sodium diet Increase cloidien to 0.2mg  BID - amLODipine  (NORVASC ) 10 MG tablet; Take 1 tablet (10 mg total) by mouth daily.  Dispense: 90 tablet; Refill: 1 - cloNIDine  (CATAPRES ) 0.2 MG tablet; Take 1 tablet (0.1 mg total) by mouth 2 (two) times daily.  Dispense: 180 tablet; Refill: 1 - losartan  (COZAAR ) 100 MG tablet; Take 1 tablet (100 mg  total) by mouth daily.  Dispense: 90 tablet; Refill: 1 - CBC with Differential/Platelet - CMP14+EGFR  2. Coronary arteriosclerosis Keep follow up with cardiology - metoprolol  succinate (TOPROL -XL) 25 MG 24 hr tablet; Take 1 tablet (25 mg total) by mouth daily.  Dispense: 90 tablet; Refill: 1  3. TIA (transient ischemic attack)  4. Simple chronic bronchitis (HCC)  5. Gastroesophageal reflux disease without esophagitis Avoid spicy foods Do not eat 2 hours prior to bedtime - omeprazole  (PRILOSEC) 40 MG capsule; Take 1 capsule (40 mg total) by mouth 2 (two) times daily.  Dispense: 180 capsule; Refill: 1  6. Hyperlipidemia with target LDL less than 100 Low fat diet - fenofibrate  160 MG tablet; Take 1 tablet (160 mg total) by mouth daily.  Dispense: 90 tablet; Refill: 1 - rosuvastatin  (CRESTOR ) 40 MG tablet; Take 1 tablet (40 mg total) by mouth daily.  Dispense: 90 tablet; Refill: 1 - Lipid panel  7. Generalized anxiety disorder Stress management - ALPRAZolam  (XANAX ) 0.5 MG tablet; Take 1 tablet (0.5 mg total) by mouth 2 (two) times daily as needed for anxiety.  Dispense: 60 tablet; Refill: 2 - escitalopram  (LEXAPRO ) 20 MG tablet; Take 2 tablets (40 mg total) by mouth daily.  Dispense: 180 tablet; Refill: 1  8. Tobacco abuse Smoking cessation encouraged  9. Class 1 obesity Discussed diet and exercise for person with BMI >25 Will recheck weight in 3-6 months    Labs pending Health Maintenance reviewed Diet and exercise encouraged  Follow up plan: 6 months   Mary-Margaret Gaylyn Keas, FNP

## 2024-01-06 NOTE — Addendum Note (Signed)
 Addended by: Cherylyn Cos on: 01/06/2024 09:15 AM   Modules accepted: Orders

## 2024-01-06 NOTE — Patient Instructions (Signed)

## 2024-01-07 LAB — CBC WITH DIFFERENTIAL/PLATELET
Basophils Absolute: 0.1 10*3/uL (ref 0.0–0.2)
Basos: 1 %
EOS (ABSOLUTE): 0.2 10*3/uL (ref 0.0–0.4)
Eos: 3 %
Hematocrit: 48.3 % (ref 37.5–51.0)
Hemoglobin: 15.7 g/dL (ref 13.0–17.7)
Immature Grans (Abs): 0 10*3/uL (ref 0.0–0.1)
Immature Granulocytes: 0 %
Lymphocytes Absolute: 2.2 10*3/uL (ref 0.7–3.1)
Lymphs: 31 %
MCH: 29.5 pg (ref 26.6–33.0)
MCHC: 32.5 g/dL (ref 31.5–35.7)
MCV: 91 fL (ref 79–97)
Monocytes Absolute: 0.5 10*3/uL (ref 0.1–0.9)
Monocytes: 7 %
Neutrophils Absolute: 4.2 10*3/uL (ref 1.4–7.0)
Neutrophils: 58 %
Platelets: 315 10*3/uL (ref 150–450)
RBC: 5.32 x10E6/uL (ref 4.14–5.80)
RDW: 13.7 % (ref 11.6–15.4)
WBC: 7.1 10*3/uL (ref 3.4–10.8)

## 2024-01-07 LAB — CMP14+EGFR
ALT: 9 IU/L (ref 0–44)
AST: 12 IU/L (ref 0–40)
Albumin: 4.6 g/dL (ref 3.9–4.9)
Alkaline Phosphatase: 53 IU/L (ref 44–121)
BUN/Creatinine Ratio: 18 (ref 10–24)
BUN: 19 mg/dL (ref 8–27)
Bilirubin Total: 0.3 mg/dL (ref 0.0–1.2)
CO2: 24 mmol/L (ref 20–29)
Calcium: 9.6 mg/dL (ref 8.6–10.2)
Chloride: 100 mmol/L (ref 96–106)
Creatinine, Ser: 1.08 mg/dL (ref 0.76–1.27)
Globulin, Total: 2.3 g/dL (ref 1.5–4.5)
Glucose: 89 mg/dL (ref 70–99)
Potassium: 4.3 mmol/L (ref 3.5–5.2)
Sodium: 141 mmol/L (ref 134–144)
Total Protein: 6.9 g/dL (ref 6.0–8.5)
eGFR: 76 mL/min/{1.73_m2} (ref >59)

## 2024-01-07 LAB — LIPID PANEL
Cholesterol, Total: 161 mg/dL (ref 100–199)
HDL: 49 mg/dL (ref >39)
LDL CALC COMMENT:: 3.3 ratio (ref 0.0–5.0)
LDL Chol Calc (NIH): 90 mg/dL (ref 0–99)
Triglycerides: 126 mg/dL (ref 0–149)
VLDL Cholesterol Cal: 22 mg/dL (ref 5–40)

## 2024-01-09 LAB — TOXASSURE SELECT 13 (MW), URINE

## 2024-01-21 ENCOUNTER — Other Ambulatory Visit: Payer: Self-pay | Admitting: Nurse Practitioner

## 2024-02-02 NOTE — Progress Notes (Signed)
  Cardiology Office Note:   Date:  02/04/2024  ID:  [name], Plancarte March 17, 1956, MRN [id number] PCP: Delfina Feller, FNP  [facility] Providers Cardiologist:  Eilleen Grates, MD {  History of Present Illness:   Eric Saunders is a 68 y.o. male who presents for followup after CABG. in 2020 he had some increased dyspnea.  I sent him for a POET (Plain Old Exercise Treadmill) and he had no evidence of ischemia.   He did have a bit of a hypertensive blood pressure response.  He was hospitalized in 2021 with syncope.    He had an EEG which was negative.  MRI brain was unremarkable.   Carotid Dopplers were unremarkable.   He was thought to have cough syncope.    Since he was last seen he has continued to have some cough with chest congestion that he feels like it just a drainage.  He might get a little presyncopal but he seems to not handle this and has had no frank syncope. The patient denies any new symptoms such as chest discomfort, neck or arm discomfort. There has been no new shortness of breath, PND or orthopnea. There have been no reported palpitations, presyncope or syncope.  He remains active clearing some woods on his property and hoping that his family will build a house there someday as they get older.  ROS: As stated in the HPI and negative for all other systems.  Studies Reviewed:    EKG:   Sinus rhythm, rate 87, axis within normals, intervals within normal limits, no acute ST-T wave changes, 08/04/2023   Risk Assessment/Calculations:              Physical Exam:   VS:  BP 130/82   Pulse 64   Ht 5\' 8"  (1.727 m)   Wt 180 lb (81.6 kg)   SpO2 98%   BMI 27.37 kg/m    Wt Readings from Last 3 Encounters:  02/04/24 180 lb (81.6 kg)  01/06/24 185 lb (83.9 kg)  08/04/23 178 lb (80.7 kg)     GEN: Well nourished, well developed in no acute distress NECK: No JVD; No carotid bruits CARDIAC: RRR, no murmurs, rubs, gallops RESPIRATORY:  Clear to auscultation without  rales, wheezing or rhonchi  ABDOMEN: Soft, non-tender, non-distended EXTREMITIES:  No edema; No deformity   ASSESSMENT AND PLAN:   CAD/CABG:   The patient has no new sypmtoms.  No further cardiovascular testing is indicated.  We will continue with aggressive risk reduction and meds as listed.   SYNCOPE:   He has had no further syncope.  I have encouraged him to see an ENT for his cough and congestion.   TOBACCO: We again talked about the need to stop smoking.  DYSLIPIDEMIA:   LDL was 90.  Goal is in the 50s.  I am going to add Zetia  10 mg daily to his regimen and repeat a lipid profile with an LP(a) in 3 months.   HTN:  The blood pressure is at target.  No change in therapy.    COUGH:   I have suggested again ENT.  PVD: He has known carotid stenosis and he is overdue for carotid Dopplers and I will arrange this.   Follow up with me in 1 year or sooner if needed.  Signed, Eilleen Grates, MD

## 2024-02-04 ENCOUNTER — Other Ambulatory Visit: Payer: Self-pay | Admitting: *Deleted

## 2024-02-04 ENCOUNTER — Ambulatory Visit: Payer: Medicare HMO | Admitting: Cardiology

## 2024-02-04 ENCOUNTER — Encounter: Payer: Self-pay | Admitting: Cardiology

## 2024-02-04 VITALS — BP 130/82 | HR 64 | Ht 68.0 in | Wt 180.0 lb

## 2024-02-04 DIAGNOSIS — Z79899 Other long term (current) drug therapy: Secondary | ICD-10-CM

## 2024-02-04 DIAGNOSIS — R55 Syncope and collapse: Secondary | ICD-10-CM

## 2024-02-04 DIAGNOSIS — E785 Hyperlipidemia, unspecified: Secondary | ICD-10-CM

## 2024-02-04 DIAGNOSIS — I2510 Atherosclerotic heart disease of native coronary artery without angina pectoris: Secondary | ICD-10-CM

## 2024-02-04 DIAGNOSIS — R059 Cough, unspecified: Secondary | ICD-10-CM

## 2024-02-04 DIAGNOSIS — I739 Peripheral vascular disease, unspecified: Secondary | ICD-10-CM | POA: Diagnosis not present

## 2024-02-04 DIAGNOSIS — Z720 Tobacco use: Secondary | ICD-10-CM

## 2024-02-04 MED ORDER — EZETIMIBE 10 MG PO TABS: 10.0000 mg | ORAL_TABLET | Freq: Every day | ORAL | 3 refills | Status: DC

## 2024-02-04 NOTE — Patient Instructions (Signed)
 Medication Instructions:  Please start Zetia  10 mg a day. Continue all other medications as listed.  *If you need a refill on your cardiac medications before your next appointment, please call your pharmacy*  Lab Work: Please have blood work today in 3 months (Lipid, LPa)  If you have labs (blood work) drawn today and your tests are completely normal, you will receive your results only by: MyChart Message (if you have MyChart) OR A paper copy in the mail If you have any lab test that is abnormal or we need to change your treatment, we will call you to review the results.  Testing/Procedures: Your physician has requested that you have a carotid duplex. This test is an ultrasound of the carotid arteries in your neck. It looks at blood flow through these arteries that supply the brain with blood. Allow one hour for this exam. There are no restrictions or special instructions. You will be contacted to be scheduled for this testing which will be completed at [facility].  Follow-Up: At [facility], you and your health needs are our priority.  As part of our continuing mission to provide you with exceptional heart care, our providers are all part of one team.  This team includes your primary Cardiologist (physician) and Advanced Practice Providers or APPs (Physician Assistants and Nurse Practitioners) who all work together to provide you with the care you need, when you need it.  Your next appointment:   1 year(s)  Provider:   Eilleen Grates, MD    We recommend signing up for the patient portal called "MyChart".  Sign up information is provided on this After Visit Summary.  MyChart is used to connect with patients for Virtual Visits (Telemedicine).  Patients are able to view lab/test results, encounter notes, upcoming appointments, etc.  Non-urgent messages can be sent to your provider as well.   To learn more about what you can do with MyChart, go to [email or web address].

## 2024-02-05 ENCOUNTER — Telehealth: Payer: Self-pay | Admitting: Cardiology

## 2024-02-05 NOTE — Telephone Encounter (Signed)
 Checking percert on the following patient for testing scheduled at [facility].   US  CAROTID DUPLEX-  02/13/24

## 2024-02-13 ENCOUNTER — Ambulatory Visit (HOSPITAL_COMMUNITY)
Admission: RE | Admit: 2024-02-13 | Discharge: 2024-02-13 | Disposition: A | Discharge status: Home / Self Care | Source: Ambulatory Visit | Attending: Cardiology | Admitting: Cardiology

## 2024-02-13 DIAGNOSIS — I739 Peripheral vascular disease, unspecified: Secondary | ICD-10-CM | POA: Diagnosis not present

## 2024-02-13 DIAGNOSIS — I6523 Occlusion and stenosis of bilateral carotid arteries: Secondary | ICD-10-CM | POA: Diagnosis not present

## 2024-02-19 ENCOUNTER — Ambulatory Visit: Payer: Self-pay | Admitting: Cardiology

## 2024-02-19 DIAGNOSIS — I6529 Occlusion and stenosis of unspecified carotid artery: Secondary | ICD-10-CM

## 2024-02-20 NOTE — Telephone Encounter (Signed)
-----   Message from Nurse Pinky Bright sent at 02/19/2024  5:23 PM EDT -----  ----- Message ----- From: Evan Hillock, RN Sent: 02/19/2024  10:58 AM EDT To: Isobel Marie, RN   ----- Message ----- From: Eilleen Grates, MD Sent: 02/19/2024   7:53 AM EDT To: [facility] Triage  Plaque right carotid is less than 50% in the right and more than 70% in the left.  Please refer to [facility] for follow up.  Call Mr. Bresee with the results and send results to Delfina Feller, FNP

## 2024-02-20 NOTE — Telephone Encounter (Signed)
 The patient has been notified of the result and verbalized understanding.  All questions (if any) were answered. Carole Churches, LPN 01/17/6643 0:34 AM

## 2024-02-25 ENCOUNTER — Encounter: Payer: Self-pay | Admitting: Physician Assistant

## 2024-02-25 ENCOUNTER — Ambulatory Visit: Attending: Vascular Surgery | Admitting: Physician Assistant

## 2024-02-25 VITALS — BP 150/89 | HR 61 | Temp 98.3°F | Ht 66.0 in | Wt 186.2 lb

## 2024-02-25 DIAGNOSIS — I6522 Occlusion and stenosis of left carotid artery: Secondary | ICD-10-CM | POA: Diagnosis not present

## 2024-02-25 NOTE — Progress Notes (Signed)
 Office Note   History of Present Illness   Eric Saunders is a 68 y.o. (1956/05/16) male who presents for surveillance of carotid artery stenosis.  He was first seen in our clinic by Dr. Shaunna [place] for asymptomatic 60 to 79% carotid stenosis.  The patient was previously told that we would not pursue carotid revascularization unless he developed left hemispheric symptoms or if his stenosis progressed to greater than 80%.  He returns today for follow-up.  He says that his cardiologist sent him back to our clinic due to his recent carotid duplex results.  There was concern that he may need carotid revascularization now.  He denies any recent strokelike symptoms such as slurred speech, facial droop, sudden weakness/numbness, or sudden visual changes.  He has a current smoker.  He takes a daily aspirin  and statin. Current Outpatient Medications  Medication Sig Dispense Refill   ALPRAZolam  (XANAX ) 0.5 MG tablet Take 1 tablet (0.5 mg total) by mouth 2 (two) times daily as needed for anxiety. 60 tablet 2   amLODipine  (NORVASC ) 10 MG tablet Take 1 tablet (10 mg total) by mouth daily. 90 tablet 1   aspirin  81 MG tablet Take 81 mg by mouth daily.     benzonatate  (TESSALON ) 100 MG capsule TAKE 1 CAPSULE BY MOUTH THREE TIMES A DAY AS NEEDED FOR COUGH (Patient taking differently: Take 100 mg by mouth 3 (three) times daily as needed for cough.) 20 capsule 0   cloNIDine  (CATAPRES ) 0.2 MG tablet Take 1 tablet (0.2 mg total) by mouth 2 (two) times daily. 180 tablet 1   cyclobenzaprine  (FLEXERIL ) 10 MG tablet TAKE 1 TABLET BY MOUTH TWICE A DAY AS NEEDED 60 tablet 2   escitalopram  (LEXAPRO ) 20 MG tablet Take 2 tablets (40 mg total) by mouth daily. 180 tablet 1   ezetimibe  (ZETIA ) 10 MG tablet Take 1 tablet (10 mg total) by mouth daily. 90 tablet 3   fenofibrate  160 MG tablet Take 1 tablet (160 mg total) by mouth daily. 90 tablet 1   ibuprofen  (ADVIL ) 800 MG tablet TAKE 1 TABLET BY MOUTH THREE TIMES A DAY 90  tablet 1   losartan  (COZAAR ) 100 MG tablet Take 1 tablet (100 mg total) by mouth daily. 90 tablet 1   metoprolol  succinate (TOPROL -XL) 25 MG 24 hr tablet Take 1 tablet (25 mg total) by mouth daily. 90 tablet 1   omeprazole  (PRILOSEC) 40 MG capsule Take 1 capsule (40 mg total) by mouth 2 (two) times daily. 180 capsule 1   rosuvastatin  (CRESTOR ) 40 MG tablet Take 1 tablet (40 mg total) by mouth daily. 90 tablet 1   sildenafil  (VIAGRA ) 100 MG tablet Take 100 mg by mouth as needed for erectile dysfunction.     triamcinolone  cream (KENALOG ) 0.1 % Apply 1 application topically 2 (two) times daily. (Patient taking differently: Apply 1 application  topically daily as needed (for itching on arms).) 453.6 g 1   No current facility-administered medications for this visit.    REVIEW OF SYSTEMS (negative unless checked):   Cardiac:  []  Chest pain or chest pressure? []  Shortness of breath upon activity? []  Shortness of breath when lying flat? []  Irregular heart rhythm?  Vascular:  []  Pain in calf, thigh, or hip brought on by walking? []  Pain in feet at night that wakes you up from your sleep? []  Blood clot in your veins? []  Leg swelling?  Pulmonary:  []  Oxygen at home? []  Productive cough? []  Wheezing?  Neurologic:  []  Sudden  weakness in arms or legs? []  Sudden numbness in arms or legs? []  Sudden onset of difficult speaking or slurred speech? []  Temporary loss of vision in one eye? []  Problems with dizziness?  Gastrointestinal:  []  Blood in stool? []  Vomited blood?  Genitourinary:  []  Burning when urinating? []  Blood in urine?  Psychiatric:  []  Major depression  Hematologic:  []  Bleeding problems? []  Problems with blood clotting?  Dermatologic:  []  Rashes or ulcers?  Constitutional:  []  Fever or chills?  Ear/Nose/Throat:  []  Change in hearing? []  Nose bleeds? []  Sore throat?  Musculoskeletal:  []  Back pain? []  Joint pain? []  Muscle pain?   Physical Examination    Vitals:   02/25/24 1313 02/25/24 1316  BP: (!) 162/72 (!) 150/89  Pulse: 61   Temp: 98.3 F (36.8 C)   TempSrc: Temporal   SpO2: 93%   Weight: 186 lb 3.2 oz (84.5 kg)   Height: 5' 6 (1.676 m)    There is no height or weight on file to calculate BMI.  General:  WDWN in NAD; vital signs documented above Gait: Not observed HENT: WNL, normocephalic Pulmonary: normal non-labored breathing , without rales, rhonchi,  wheezing Cardiac: regular Abdomen: soft, NT, no masses Skin: without rashes Vascular Exam/Pulses: palpable radial pulses bilaterally Extremities: without ischemic changes, without gangrene , without cellulitis; without open wounds;  Musculoskeletal: no muscle wasting or atrophy  Neurologic: A&O X 3;  No focal weakness or paresthesias are detected Psychiatric:  The pt has Normal affect.  Non-Invasive Vascular Imaging   Bilateral Carotid Duplex (02/13/2024):  R ICA stenosis:  40-59% R VA:  patent and antegrade L ICA stenosis:  60-79% L VA:  patent and antegrade   Medical Decision Making   Eric Saunders is a 68 y.o. male who presents for surveillance of carotid artery stenosis  The patient was last seen in our office in 2023 with asymptomatic, 60 to 79% stenosis of the left ICA He returns today for follow-up.  He recently had a repeat carotid duplex performed on May 30.  His left ICA stenosis was graded at greater than 70%.  I have reviewed the patient's duplex and his left ICA stenosis does not appear to be greater than 80%.  He does not have any end-diastolic velocities greater than 115 cm/s.  Overall his ultrasound results appear stable from 2023 On exam he has no neurological deficits.  He denies any strokelike symptoms such as slurred speech, facial droop, sudden vision changes, or sudden weakness/numbness. I have discussed with the patient that his ultrasound results appear stable.  At this time he has no indications for carotid revascularization, given that  his carotid stenosis is less than 80% and he is asymptomatic I have recommended that he continue his daily aspirin  and statin.  He can follow-up with our office in 6 months with repeat carotid duplex   Deneise Finlay PA-C [facility] Office: [phone]  Clinic MD: Vikki Graves

## 2024-02-26 ENCOUNTER — Other Ambulatory Visit: Payer: Self-pay | Admitting: *Deleted

## 2024-02-26 DIAGNOSIS — I6522 Occlusion and stenosis of left carotid artery: Secondary | ICD-10-CM

## 2024-03-22 ENCOUNTER — Other Ambulatory Visit: Payer: Self-pay | Admitting: Nurse Practitioner

## 2024-03-26 ENCOUNTER — Other Ambulatory Visit: Payer: Self-pay | Admitting: Nurse Practitioner

## 2024-03-26 DIAGNOSIS — I10 Essential (primary) hypertension: Secondary | ICD-10-CM

## 2024-05-23 ENCOUNTER — Other Ambulatory Visit: Payer: Self-pay | Admitting: Nurse Practitioner

## 2024-06-22 ENCOUNTER — Other Ambulatory Visit: Payer: Self-pay | Admitting: Nurse Practitioner

## 2024-06-22 DIAGNOSIS — I10 Essential (primary) hypertension: Secondary | ICD-10-CM

## 2024-07-01 ENCOUNTER — Ambulatory Visit: Admitting: Nurse Practitioner

## 2024-07-05 ENCOUNTER — Other Ambulatory Visit: Payer: Self-pay | Admitting: Nurse Practitioner

## 2024-07-05 DIAGNOSIS — F411 Generalized anxiety disorder: Secondary | ICD-10-CM

## 2024-07-18 ENCOUNTER — Other Ambulatory Visit: Payer: Self-pay | Admitting: Nurse Practitioner

## 2024-07-20 ENCOUNTER — Encounter: Payer: Self-pay | Admitting: Nurse Practitioner

## 2024-07-20 ENCOUNTER — Ambulatory Visit (INDEPENDENT_AMBULATORY_CARE_PROVIDER_SITE_OTHER): Payer: Self-pay | Admitting: Nurse Practitioner

## 2024-07-20 VITALS — BP 133/73 | HR 53 | Temp 97.5°F | Ht 66.0 in | Wt 169.0 lb

## 2024-07-20 DIAGNOSIS — F411 Generalized anxiety disorder: Secondary | ICD-10-CM

## 2024-07-20 DIAGNOSIS — I10 Essential (primary) hypertension: Secondary | ICD-10-CM

## 2024-07-20 DIAGNOSIS — G459 Transient cerebral ischemic attack, unspecified: Secondary | ICD-10-CM

## 2024-07-20 DIAGNOSIS — E66811 Obesity, class 1: Secondary | ICD-10-CM

## 2024-07-20 DIAGNOSIS — J410 Simple chronic bronchitis: Secondary | ICD-10-CM

## 2024-07-20 DIAGNOSIS — I2510 Atherosclerotic heart disease of native coronary artery without angina pectoris: Secondary | ICD-10-CM | POA: Diagnosis not present

## 2024-07-20 DIAGNOSIS — E785 Hyperlipidemia, unspecified: Secondary | ICD-10-CM

## 2024-07-20 DIAGNOSIS — K219 Gastro-esophageal reflux disease without esophagitis: Secondary | ICD-10-CM

## 2024-07-20 DIAGNOSIS — Z720 Tobacco use: Secondary | ICD-10-CM

## 2024-07-20 LAB — LIPID PANEL

## 2024-07-20 MED ORDER — FENOFIBRATE 160 MG PO TABS: 160.0000 mg | ORAL_TABLET | Freq: Every day | ORAL | 1 refills | Status: AC

## 2024-07-20 MED ORDER — LOSARTAN POTASSIUM 100 MG PO TABS: 100.0000 mg | ORAL_TABLET | Freq: Every day | ORAL | 1 refills | Status: AC

## 2024-07-20 MED ORDER — METOPROLOL SUCCINATE ER 25 MG PO TB24: 25.0000 mg | ORAL_TABLET | Freq: Every day | ORAL | 1 refills | Status: AC

## 2024-07-20 MED ORDER — AMLODIPINE BESYLATE 10 MG PO TABS: 10.0000 mg | ORAL_TABLET | Freq: Every day | ORAL | 1 refills | Status: AC

## 2024-07-20 MED ORDER — ESCITALOPRAM OXALATE 20 MG PO TABS: 40.0000 mg | ORAL_TABLET | Freq: Every day | ORAL | 1 refills | Status: AC

## 2024-07-20 MED ORDER — EZETIMIBE 10 MG PO TABS: 10.0000 mg | ORAL_TABLET | Freq: Every day | ORAL | 1 refills | Status: AC

## 2024-07-20 MED ORDER — ALPRAZOLAM 0.5 MG PO TABS: 0.5000 mg | ORAL_TABLET | Freq: Two times a day (BID) | ORAL | 2 refills | Status: AC | PRN

## 2024-07-20 MED ORDER — ROSUVASTATIN CALCIUM 40 MG PO TABS: 40.0000 mg | ORAL_TABLET | Freq: Every day | ORAL | 1 refills | Status: AC

## 2024-07-20 MED ORDER — CLONIDINE HCL 0.2 MG PO TABS: 0.2000 mg | ORAL_TABLET | Freq: Two times a day (BID) | ORAL | 1 refills | Status: AC

## 2024-07-20 MED ORDER — OMEPRAZOLE 40 MG PO CPDR: 40.0000 mg | DELAYED_RELEASE_CAPSULE | Freq: Two times a day (BID) | ORAL | 1 refills | Status: AC

## 2024-07-20 NOTE — Patient Instructions (Signed)

## 2024-07-20 NOTE — Progress Notes (Signed)
 Subjective:    Patient ID: Eric Saunders, male    DOB: 11/20/1955, 68 y.o.   MRN: [id number]   Chief Complaint: medical management of chronic issues     HPI:  Eric Saunders is a 68 y.o. who identifies as a male who was assigned male at birth.   Social history: Lives with: wife Work history: disability   Comes in today for follow up of the following chronic medical issues:  1. Essential hypertension No c/o chest pain, sob or headache. Does not check blood pressure at home. BP Readings from Last 3 Encounters:  07/20/24 133/73  02/25/24 (!) 150/89  02/04/24 130/82     2. Coronary arteriosclerosis Last saw cardiology on 02/04/24. They added zetia  to cholesterol meds  3. TIA (transient ischemic attack) No permanent effects  4. Simple chronic bronchitis (HCC) Has chronic cough. Is currently on no inhalers  5. Gastroesophageal reflux disease without esophagitis Is on Eric dose of omeprazole  and is doing well.  6. Hyperlipidemia with target LDL less than 100 Does try to watch diet and stays active but does no dedicated exercise. Lab Results  Component Value Date   CHOL 161 01/06/2024   HDL 49 01/06/2024   LDLCALC 90 01/06/2024   TRIG 126 01/06/2024   CHOLHDL 3.3 01/06/2024     7. Generalized anxiety disorder Is on combination of lexapro  and xanax . Is doing well currently    07/20/2024   11:05 AM 01/06/2024    8:44 AM 08/04/2023    7:59 AM 07/07/2023   11:03 AM  GAD 7 : Generalized Anxiety Score  Nervous, Anxious, on Edge 1 1 0 1  Control/stop worrying 0 0 0 0  Worry too much - different things 0 0 0 0  Trouble relaxing 1 1 0 0  Restless 1 1 0 1  Easily annoyed or irritable 1 1 0 1  Afraid - awful might happen 0 0 0 0  Total GAD 7 Score 4 4 0 3  Anxiety Difficulty Not difficult at all Not difficult at all  Not difficult at all      8. Tobacco abuse Still smokes over a pack a day.  9. Class 1 obesity Weight is down 17lbs Wt Readings from Last 3  Encounters:  07/20/24 169 lb (76.7 kg)  02/25/24 186 lb 3.2 oz (84.5 kg)  02/04/24 180 lb (81.6 kg)   BMI Readings from Last 3 Encounters:  07/20/24 27.28 kg/m  02/25/24 30.05 kg/m  02/04/24 27.37 kg/m      New complaints: None today   Allergies  Allergen Reactions   Ace Inhibitors Cough   Outpatient Encounter Medications as of 07/20/2024  Medication Sig   ALPRAZolam  (XANAX ) 0.5 MG tablet Take 1 tablet (0.5 mg total) by mouth 2 (two) times Eric as needed for anxiety.   amLODipine  (NORVASC ) 10 MG tablet Take 1 tablet (10 mg total) by mouth Eric.   aspirin  81 MG tablet Take 81 mg by mouth Eric.   benzonatate  (TESSALON ) 100 MG capsule TAKE 1 CAPSULE BY MOUTH THREE TIMES A DAY AS NEEDED FOR COUGH   cloNIDine  (CATAPRES ) 0.2 MG tablet TAKE 1 TABLET BY MOUTH 2 TIMES Eric.   cyclobenzaprine  (FLEXERIL ) 10 MG tablet TAKE 1 TABLET BY MOUTH TWICE A DAY AS NEEDED   escitalopram  (LEXAPRO ) 20 MG tablet Take 2 tablets (40 mg total) by mouth Eric.   ezetimibe  (ZETIA ) 10 MG tablet Take 1 tablet (10 mg total) by mouth Eric.   fenofibrate  160  MG tablet Take 1 tablet (160 mg total) by mouth Eric.   ibuprofen  (ADVIL ) 800 MG tablet TAKE 1 TABLET BY MOUTH THREE TIMES A DAY   losartan  (COZAAR ) 100 MG tablet Take 1 tablet (100 mg total) by mouth Eric.   metoprolol  succinate (TOPROL -XL) 25 MG 24 hr tablet Take 1 tablet (25 mg total) by mouth Eric.   omeprazole  (PRILOSEC) 40 MG capsule Take 1 capsule (40 mg total) by mouth 2 (two) times Eric.   rosuvastatin  (CRESTOR ) 40 MG tablet Take 1 tablet (40 mg total) by mouth Eric.   sildenafil  (VIAGRA ) 100 MG tablet Take 100 mg by mouth as needed for erectile dysfunction.   triamcinolone  cream (KENALOG ) 0.1 % Apply 1 application topically 2 (two) times Eric.   No facility-administered encounter medications on file as of 07/20/2024.    Past Surgical History:  Procedure Laterality Date   COLONOSCOPY     CORONARY ARTERY BYPASS GRAFT N/A 06/17/2014    Procedure: CORONARY ARTERY BYPASS GRAFTING (CABG) X 4, RIGHT LEG EVH;  Surgeon: Dorise MARLA Fellers, MD;  Location: [facility] OR;  Service: Open Heart Surgery;  Laterality: N/A;   KNEE SURGERY     bilateral arthroscopic   LEFT HEART CATHETERIZATION WITH CORONARY ANGIOGRAM N/A 06/16/2014   Procedure: LEFT HEART CATHETERIZATION WITH CORONARY ANGIOGRAM;  Surgeon: Lonni JONETTA Cash, MD;  Location: [facility] CATH LAB;  Service: Cardiovascular;  Laterality: N/A;   TEE WITHOUT CARDIOVERSION N/A 06/17/2014   Procedure: TRANSESOPHAGEAL ECHOCARDIOGRAM (TEE);  Surgeon: Dorise MARLA Fellers, MD;  Location: [facility] OR;  Service: Open Heart Surgery;  Laterality: N/A;   UPPER GASTROINTESTINAL ENDOSCOPY      Family History  Problem Relation Age of Onset   Drug abuse Sister    Diabetes Sister    Healthy Sister    Healthy Sister    Healthy Sister    Healthy Sister    Healthy Brother    Healthy Brother    Colon cancer Neg Hx    Esophageal cancer Neg Hx    Inflammatory bowel disease Neg Hx    Liver disease Neg Hx    Pancreatic cancer Neg Hx    Rectal cancer Neg Hx    Stomach cancer Neg Hx    Stroke Neg Hx       Controlled substance contract: n/a     Review of Systems  Constitutional:  Negative for diaphoresis.  Eyes:  Negative for pain.  Respiratory:  Negative for shortness of breath.   Cardiovascular:  Negative for chest pain, palpitations and leg swelling.  Gastrointestinal:  Negative for abdominal pain.  Endocrine: Negative for polydipsia.  Skin:  Negative for rash.  Neurological:  Negative for dizziness, weakness and headaches.  Hematological:  Does not bruise/bleed easily.  All other systems reviewed and are negative.      Objective:   Physical Exam Vitals and nursing note reviewed.  Constitutional:      Appearance: Normal appearance. He is well-developed.  HENT:     Head: Normocephalic.     Nose: Nose normal.     Mouth/Throat:     Mouth: Mucous membranes are moist.     Pharynx: Oropharynx is  clear.  Eyes:     Pupils: Pupils are equal, round, and reactive to light.  Neck:     Thyroid : No thyroid  mass or thyromegaly.     Vascular: No carotid bruit or JVD.     Trachea: Phonation normal.  Cardiovascular:     Rate and Rhythm: Normal rate and regular  rhythm.  Pulmonary:     Effort: Pulmonary effort is normal. No respiratory distress.     Breath sounds: Normal breath sounds.  Abdominal:     General: Bowel sounds are normal.     Palpations: Abdomen is soft.     Tenderness: There is no abdominal tenderness.  Musculoskeletal:        General: Normal range of motion.     Cervical back: Normal range of motion and neck supple.  Lymphadenopathy:     Cervical: No cervical adenopathy.  Skin:    General: Skin is warm and dry.  Neurological:     Mental Status: He is alert and oriented to person, place, and time.  Psychiatric:        Behavior: Behavior normal.        Thought Content: Thought content normal.        Judgment: Judgment normal.    BP 133/73   Pulse (!) 53   Temp (!) 97.5 F (36.4 C) (Temporal)   Ht 5' 6 (1.676 m)   Wt 169 lb (76.7 kg)   SpO2 93%   BMI 27.28 kg/m         Assessment & Plan:  Eric Saunders comes in today with chief complaint of Medical Management of Chronic Issues   Diagnosis and orders addressed:  1. Essential hypertension Low sodium diet - amLODipine  (NORVASC ) 10 MG tablet; Take 1 tablet (10 mg total) by mouth Eric.  Dispense: 90 tablet; Refill: 1 - cloNIDine  (CATAPRES ) 0.1 MG tablet; Take 1 tablet (0.1 mg total) by mouth 2 (two) times Eric.  Dispense: 180 tablet; Refill: 1 - losartan  (COZAAR ) 100 MG tablet; Take 1 tablet (100 mg total) by mouth Eric.  Dispense: 90 tablet; Refill: 1 - CBC with Differential/Platelet - CMP14+EGFR  2. Coronary arteriosclerosis Keep follow up with cardiology - metoprolol  succinate (TOPROL -XL) 25 MG 24 hr tablet; Take 1 tablet (25 mg total) by mouth Eric.  Dispense: 90 tablet; Refill: 1  3. TIA  (transient ischemic attack)  4. Simple chronic bronchitis (HCC)  5. Gastroesophageal reflux disease without esophagitis Avoid spicy foods Do not eat 2 hours prior to bedtime - omeprazole  (PRILOSEC) 40 MG capsule; Take 1 capsule (40 mg total) by mouth 2 (two) times Eric.  Dispense: 180 capsule; Refill: 1  6. Hyperlipidemia with target LDL less than 100 Low fat diet - fenofibrate  160 MG tablet; Take 1 tablet (160 mg total) by mouth Eric.  Dispense: 90 tablet; Refill: 1 - rosuvastatin  (CRESTOR ) 40 MG tablet; Take 1 tablet (40 mg total) by mouth Eric.  Dispense: 90 tablet; Refill: 1 - Lipid panel  7. Generalized anxiety disorder Stress management - ALPRAZolam  (XANAX ) 0.5 MG tablet; Take 1 tablet (0.5 mg total) by mouth 2 (two) times Eric as needed for anxiety.  Dispense: 60 tablet; Refill: 2 - escitalopram  (LEXAPRO ) 20 MG tablet; Take 2 tablets (40 mg total) by mouth Eric.  Dispense: 180 tablet; Refill: 1  8. Tobacco abuse Smoking cessation encouraged - Ambulatory Referral for Lung Cancer Scre  9. Class 1 obesity Discussed diet and exercise for person with BMI >25 Will recheck weight in 3-6 months    Labs pending Health Maintenance reviewed Diet and exercise encouraged  Follow up plan: 6 months   Mary-Margaret Gladis, FNP

## 2024-07-21 LAB — LIPID PANEL
Cholesterol, Total: 140 mg/dL (ref 100–199)
HDL: 59 mg/dL (ref >39)
LDL CALC COMMENT:: 2.4 ratio (ref 0.0–5.0)
LDL Chol Calc (NIH): 64 mg/dL (ref 0–99)
Triglycerides: 88 mg/dL (ref 0–149)
VLDL Cholesterol Cal: 17 mg/dL (ref 5–40)

## 2024-07-21 LAB — CBC WITH DIFFERENTIAL/PLATELET
Basophils Absolute: 0.1 x10E3/uL (ref 0.0–0.2)
Basos: 1 %
EOS (ABSOLUTE): 0.3 x10E3/uL (ref 0.0–0.4)
Eos: 3 %
Hematocrit: 48.2 % (ref 37.5–51.0)
Hemoglobin: 15.8 g/dL (ref 13.0–17.7)
Immature Grans (Abs): 0 x10E3/uL (ref 0.0–0.1)
Immature Granulocytes: 0 %
Lymphocytes Absolute: 2.5 x10E3/uL (ref 0.7–3.1)
Lymphs: 33 %
MCH: 30.4 pg (ref 26.6–33.0)
MCHC: 32.8 g/dL (ref 31.5–35.7)
MCV: 93 fL (ref 79–97)
Monocytes Absolute: 0.6 x10E3/uL (ref 0.1–0.9)
Monocytes: 8 %
Neutrophils Absolute: 4.2 x10E3/uL (ref 1.4–7.0)
Neutrophils: 55 %
Platelets: 329 x10E3/uL (ref 150–450)
RBC: 5.19 x10E6/uL (ref 4.14–5.80)
RDW: 14 % (ref 11.6–15.4)
WBC: 7.7 x10E3/uL (ref 3.4–10.8)

## 2024-07-21 LAB — CMP14+EGFR
ALT: 13 IU/L (ref 0–44)
AST: 15 IU/L (ref 0–40)
Albumin: 4.5 g/dL (ref 3.9–4.9)
Alkaline Phosphatase: 49 IU/L (ref 47–123)
BUN/Creatinine Ratio: 16 (ref 10–24)
BUN: 15 mg/dL (ref 8–27)
Bilirubin Total: 0.4 mg/dL (ref 0.0–1.2)
CO2: 25 mmol/L (ref 20–29)
Calcium: 9.8 mg/dL (ref 8.6–10.2)
Chloride: 100 mmol/L (ref 96–106)
Creatinine, Ser: 0.96 mg/dL (ref 0.76–1.27)
Globulin, Total: 2.6 g/dL (ref 1.5–4.5)
Glucose: 90 mg/dL (ref 70–99)
Potassium: 4.3 mmol/L (ref 3.5–5.2)
Sodium: 141 mmol/L (ref 134–144)
Total Protein: 7.1 g/dL (ref 6.0–8.5)
eGFR: 87 mL/min/1.73 (ref >59)

## 2024-07-22 ENCOUNTER — Ambulatory Visit: Payer: Self-pay | Admitting: Nurse Practitioner

## 2024-08-15 ENCOUNTER — Other Ambulatory Visit: Payer: Self-pay | Admitting: Nurse Practitioner

## 2024-09-01 ENCOUNTER — Ambulatory Visit

## 2024-09-01 ENCOUNTER — Ambulatory Visit (HOSPITAL_COMMUNITY)

## 2024-09-24 ENCOUNTER — Other Ambulatory Visit: Payer: Self-pay | Admitting: *Deleted

## 2024-09-24 DIAGNOSIS — Z87891 Personal history of nicotine dependence: Secondary | ICD-10-CM

## 2024-09-24 DIAGNOSIS — Z122 Encounter for screening for malignant neoplasm of respiratory organs: Secondary | ICD-10-CM

## 2024-09-24 DIAGNOSIS — F17210 Nicotine dependence, cigarettes, uncomplicated: Secondary | ICD-10-CM

## 2024-10-10 ENCOUNTER — Other Ambulatory Visit: Payer: Self-pay | Admitting: Nurse Practitioner

## 2024-10-11 ENCOUNTER — Other Ambulatory Visit: Payer: Self-pay | Admitting: Nurse Practitioner

## 2024-10-14 ENCOUNTER — Encounter: Payer: Self-pay | Admitting: Acute Care

## 2024-10-20 ENCOUNTER — Ambulatory Visit (HOSPITAL_COMMUNITY)
Admission: RE | Admit: 2024-10-20 | Discharge: 2024-10-20 | Disposition: A | Source: Ambulatory Visit | Attending: Physician Assistant

## 2024-10-20 ENCOUNTER — Ambulatory Visit: Admitting: Physician Assistant

## 2024-10-20 VITALS — BP 149/81 | HR 64 | Ht 66.0 in | Wt 179.6 lb

## 2024-10-20 DIAGNOSIS — I6522 Occlusion and stenosis of left carotid artery: Secondary | ICD-10-CM

## 2024-10-20 NOTE — Progress Notes (Signed)
 " Office Note     CC:  follow up Requesting Provider:  Gladis Mustard, FNP  HPI: Eric Saunders is a 69 y.o. ([birth year]) male who presents for surveillance of carotid artery stenosis.  He is followed every 6 months with a carotid duplex due to 60 to 79% stenosis of the left ICA.  Since last office visit he denies any diagnosis of CVA or TIA.  He also denies any strokelike symptoms including slurring speech, changes in vision, or one-sided weakness.  He is taking a daily aspirin  and statin.  He is an everyday smoker.  Surgical history significant for CABG in 2015.   Past Medical History:  Diagnosis Date   Arthritis    Carotid artery occlusion    COPD (chronic obstructive pulmonary disease) (HCC)    Coronary artery disease    Depression    GERD (gastroesophageal reflux disease)    History of angina    Hyperlipemia    Hypertension    Panic disorder    Tobacco abuse     Past Surgical History:  Procedure Laterality Date   COLONOSCOPY     CORONARY ARTERY BYPASS GRAFT N/A 06/17/2014   Procedure: CORONARY ARTERY BYPASS GRAFTING (CABG) X 4, RIGHT LEG EVH;  Surgeon: Dorise MARLA Fellers, MD;  Location: [facility] OR;  Service: Open Heart Surgery;  Laterality: N/A;   KNEE SURGERY     bilateral arthroscopic   LEFT HEART CATHETERIZATION WITH CORONARY ANGIOGRAM N/A 06/16/2014   Procedure: LEFT HEART CATHETERIZATION WITH CORONARY ANGIOGRAM;  Surgeon: Lonni JONETTA Cash, MD;  Location: [facility] CATH LAB;  Service: Cardiovascular;  Laterality: N/A;   TEE WITHOUT CARDIOVERSION N/A 06/17/2014   Procedure: TRANSESOPHAGEAL ECHOCARDIOGRAM (TEE);  Surgeon: Dorise MARLA Fellers, MD;  Location: [facility] OR;  Service: Open Heart Surgery;  Laterality: N/A;   UPPER GASTROINTESTINAL ENDOSCOPY      Social History   Socioeconomic History   Marital status: Married    Spouse name: Tamon Parkerson   Number of children: 2   Years of education: 12   Highest education level: 12th grade  Occupational History   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  Also Admitted to Vaping.    Smoking Cessation Classes, Services & Resources Encouraged.  Vaping Use   Vaping status: Some Days   Substances: Nicotine   Substance and Sexual Activity   Alcohol use: Yes    Alcohol/week: 6.0 standard drinks of alcohol    Types: 6 Cans of beer per week    Comment: 6 weekly   Drug use: No   Sexual activity: Not Currently    Partners: Female  Other Topics Concern   Not on file  Social History Narrative   Lives with wife.   Working on permanent disability.    Social Drivers of Health   Tobacco Use: High Risk (10/20/2024)   Patient History    Smoking Tobacco Use: Every Day    Smokeless Tobacco Use: Never    Passive Exposure: Not on file  Financial Resource Strain: Low Risk (10/31/2022)   Overall Financial Resource Strain (CARDIA)    Difficulty of Paying  Living Expenses: Not hard at all  Food Insecurity: No Food Insecurity (01/31/2023)   Hunger Vital Sign    Worried About Running Out of Food in the Last Year: Never true    Ran Out of Food in the Last Year: Never true  Transportation Needs: No Transportation Needs (01/31/2023)   PRAPARE - Administrator, Civil Service (Medical): No    Lack of Transportation (Non-Medical): No  Physical Activity: Sufficiently Active (10/31/2022)   Exercise Vital Sign    Days of Exercise per Week: 5 days    Minutes of Exercise per Session: 30 min  Stress: Stress Concern Present (10/31/2022)   [employer] of Occupational Health - Occupational Stress Questionnaire    Feeling of Stress : Very much  Social Connections: Moderately Integrated (10/31/2022)   Social  Connection and Isolation Panel    Frequency of Communication with Friends and Family: More than three times a week    Frequency of Social Gatherings with Friends and Family: More than three times a week    Attends Religious Services: Never    Database Administrator or Organizations: No    Attends Engineer, Structural: 1 to 4 times per year    Marital Status: Married  Recent Concern: Social Connections - Moderately Isolated (10/31/2022)   Social Connection and Isolation Panel    Frequency of Communication with Friends and Family: More than three times a week    Frequency of Social Gatherings with Friends and Family: More than three times a week    Attends Religious Services: Never    Database Administrator or Organizations: No    Attends Banker Meetings: Never    Marital Status: Married  Catering Manager Violence: Not At Risk (10/31/2022)   Humiliation, Afraid, Rape, and Kick questionnaire    Fear of Current or Ex-Partner: No    Emotionally Abused: No    Physically Abused: No    Sexually Abused: No  Depression (PHQ2-9): Low Risk (07/20/2024)   Depression (PHQ2-9)    PHQ-2 Score: 0  Alcohol Screen: Low Risk (10/31/2022)   Alcohol Screen    Last Alcohol Screening Score (AUDIT): 3  Housing: Low Risk (10/31/2022)   Housing    Last Housing Risk Score: 0  Utilities: Not At Risk (01/31/2023)   AHC Utilities    Threatened with loss of utilities: No  Health Literacy: Adequate Health Literacy (11/03/2023)   B1300 Health Literacy    Frequency of need for help with medical instructions: Never    Family History  Problem Relation Age of Onset   Drug abuse Sister    Diabetes Sister    Healthy Sister    Healthy Sister    Healthy Sister    Healthy Sister    Healthy Brother    Healthy Brother    Colon cancer Neg Hx    Esophageal cancer Neg Hx    Inflammatory bowel disease Neg Hx    Liver disease Neg Hx    Pancreatic cancer Neg Hx    Rectal cancer Neg Hx    Stomach  cancer Neg Hx    Stroke Neg Hx     Current Outpatient Medications  Medication Sig Dispense Refill   ALPRAZolam  (XANAX ) 0.5 MG tablet Take 1 tablet (0.5 mg total) by mouth 2 (two) times daily as needed for anxiety. 60 tablet 2   amLODipine  (NORVASC ) 10 MG tablet Take 1 tablet (10 mg total) by mouth daily. 90 tablet 1  aspirin  81 MG tablet Take 81 mg by mouth daily.     benzonatate  (TESSALON ) 100 MG capsule TAKE 1 CAPSULE BY MOUTH THREE TIMES A DAY AS NEEDED FOR COUGH 20 capsule 0   cloNIDine  (CATAPRES ) 0.2 MG tablet Take 1 tablet (0.2 mg total) by mouth 2 (two) times daily. 180 tablet 1   cyclobenzaprine  (FLEXERIL ) 10 MG tablet TAKE 1 TABLET BY MOUTH TWICE A DAY AS NEEDED 60 tablet 0   escitalopram  (LEXAPRO ) 20 MG tablet Take 2 tablets (40 mg total) by mouth daily. 180 tablet 1   ezetimibe  (ZETIA ) 10 MG tablet Take 1 tablet (10 mg total) by mouth daily. 90 tablet 1   fenofibrate  160 MG tablet Take 1 tablet (160 mg total) by mouth daily. 90 tablet 1   ibuprofen  (ADVIL ) 800 MG tablet TAKE 1 TABLET BY MOUTH THREE TIMES A DAY 90 tablet 1   losartan  (COZAAR ) 100 MG tablet Take 1 tablet (100 mg total) by mouth daily. 90 tablet 1   metoprolol  succinate (TOPROL -XL) 25 MG 24 hr tablet Take 1 tablet (25 mg total) by mouth daily. 90 tablet 1   omeprazole  (PRILOSEC) 40 MG capsule Take 1 capsule (40 mg total) by mouth 2 (two) times daily. 180 capsule 1   rosuvastatin  (CRESTOR ) 40 MG tablet Take 1 tablet (40 mg total) by mouth daily. 90 tablet 1   sildenafil  (VIAGRA ) 100 MG tablet Take 100 mg by mouth as needed for erectile dysfunction.     triamcinolone  cream (KENALOG ) 0.1 % Apply 1 application topically 2 (two) times daily. 453.6 g 1   No current facility-administered medications for this visit.    Allergies[1]   REVIEW OF SYSTEMS:  Negative unless noted in HPI [X]  denotes positive finding, [ ]  denotes negative finding Cardiac  Comments:  Chest pain or chest pressure:    Shortness of breath  upon exertion:    Short of breath when lying flat:    Irregular heart rhythm:        Vascular    Pain in calf, thigh, or hip brought on by ambulation:    Pain in feet at night that wakes you up from your sleep:     Blood clot in your veins:    Leg swelling:         Pulmonary    Oxygen at home:    Productive cough:     Wheezing:         Neurologic    Sudden weakness in arms or legs:     Sudden numbness in arms or legs:     Sudden onset of difficulty speaking or slurred speech:    Temporary loss of vision in one eye:     Problems with dizziness:         Gastrointestinal    Blood in stool:     Vomited blood:         Genitourinary    Burning when urinating:     Blood in urine:        Psychiatric    Major depression:         Hematologic    Bleeding problems:    Problems with blood clotting too easily:        Skin    Rashes or ulcers:        Constitutional    Fever or chills:      PHYSICAL EXAMINATION:  Vitals:   10/20/24 0919 10/20/24 0921  BP: (!) 143/71 (!) 149/81  Pulse: ROLLEN)  50 64  Weight: 179 lb 9.6 oz (81.5 kg)   Height: 5' 6 (1.676 m)     General:  WDWN in NAD; vital signs documented above Gait: Not observed HENT: WNL, normocephalic Pulmonary: normal non-labored breathing Cardiac: regular HR Abdomen: soft, NT, no masses Skin: without rashes Vascular Exam/Pulses: symmetrical radial pulses Extremities: without ischemic changes, without Gangrene , without cellulitis; without open wounds;  Musculoskeletal: no muscle wasting or atrophy  Neurologic: A&O X 3; CN grossly intact Psychiatric:  The pt has Normal affect.   Non-Invasive Vascular Imaging:   Right ICA 1 to 39% stenosis Left ICA 60 to 79% stenosis    ASSESSMENT/PLAN:: 69 y.o. male here for follow up for surveillance of carotid artery stenosis  Mr. Camarena is a 69 year old male followed for surveillance of carotid artery stenosis by duplex.  He has 60 to 79% stenosis of the left ICA by  duplex which is stable compared to last office visit.  This continues to be asymptomatic and there is no indication for revascularization at this time.  He will continue his aspirin  and statin daily.  I encouraged smoking cessation.  He will continue regular follow-up with his PCP for management of chronic medical conditions.  We will repeat carotid duplex in 6 months.  He knows to seek immediate medical attention if he develops any strokelike symptoms.   Donnice Sender, PA-C [facility] [phone]  Clinic MD:   Sheree     [1]  Allergies Allergen Reactions   Ace Inhibitors Cough   "

## 2024-11-02 ENCOUNTER — Ambulatory Visit

## 2025-01-17 ENCOUNTER — Ambulatory Visit: Admitting: Nurse Practitioner

## 2025-03-02 ENCOUNTER — Ambulatory Visit: Admitting: Cardiology
